# Patient Record
Sex: Female | Born: 1935
Health system: Southern US, Community
[De-identification: ages and names within clinical notes are randomized; demographics above are authoritative.]

## PROBLEM LIST (undated history)

## (undated) DIAGNOSIS — K635 Polyp of colon: Secondary | ICD-10-CM

## (undated) DIAGNOSIS — H9190 Unspecified hearing loss, unspecified ear: Secondary | ICD-10-CM

## (undated) DIAGNOSIS — E7800 Pure hypercholesterolemia, unspecified: Secondary | ICD-10-CM

## (undated) DIAGNOSIS — I639 Cerebral infarction, unspecified: Secondary | ICD-10-CM

## (undated) DIAGNOSIS — K5792 Diverticulitis of intestine, part unspecified, without perforation or abscess without bleeding: Secondary | ICD-10-CM

## (undated) DIAGNOSIS — T4145XA Adverse effect of unspecified anesthetic, initial encounter: Secondary | ICD-10-CM

## (undated) DIAGNOSIS — H3530 Unspecified macular degeneration: Secondary | ICD-10-CM

## (undated) DIAGNOSIS — I10 Essential (primary) hypertension: Secondary | ICD-10-CM

## (undated) DIAGNOSIS — Z8719 Personal history of other diseases of the digestive system: Secondary | ICD-10-CM

## (undated) DIAGNOSIS — Z8619 Personal history of other infectious and parasitic diseases: Secondary | ICD-10-CM

## (undated) DIAGNOSIS — F419 Anxiety disorder, unspecified: Secondary | ICD-10-CM

## (undated) DIAGNOSIS — M47816 Spondylosis without myelopathy or radiculopathy, lumbar region: Secondary | ICD-10-CM

## (undated) DIAGNOSIS — F0390 Unspecified dementia without behavioral disturbance: Secondary | ICD-10-CM

## (undated) HISTORY — PX: CATARACT EXTRACTION: SUR2

## (undated) HISTORY — DX: Unspecified hearing loss, unspecified ear: H91.90

## (undated) HISTORY — DX: Pure hypercholesterolemia, unspecified: E78.00

## (undated) HISTORY — DX: Diverticulitis of intestine, part unspecified, without perforation or abscess without bleeding: K57.92

## (undated) HISTORY — DX: Polyp of colon: K63.5

## (undated) HISTORY — PX: VENTRAL HERNIA REPAIR: SHX424

## (undated) HISTORY — PX: APPENDECTOMY: SHX54

## (undated) HISTORY — DX: Spondylosis without myelopathy or radiculopathy, lumbar region: M47.816

---

## 1985-09-30 HISTORY — PX: ABDOMINAL HYSTERECTOMY: SHX81

## 1987-10-01 HISTORY — PX: CHOLECYSTECTOMY: SHX55

## 1999-04-18 ENCOUNTER — Encounter: Payer: Self-pay | Admitting: Orthopedic Surgery

## 1999-04-18 ENCOUNTER — Ambulatory Visit (HOSPITAL_COMMUNITY): Admission: RE | Admit: 1999-04-18 | Discharge: 1999-04-18 | Payer: Self-pay | Admitting: Orthopedic Surgery

## 1999-04-25 ENCOUNTER — Ambulatory Visit: Admission: RE | Admit: 1999-04-25 | Discharge: 1999-04-25 | Payer: Self-pay | Admitting: Otolaryngology

## 2000-06-04 ENCOUNTER — Ambulatory Visit (HOSPITAL_COMMUNITY): Admission: RE | Admit: 2000-06-04 | Discharge: 2000-06-04 | Payer: Self-pay | Admitting: Gastroenterology

## 2000-06-04 ENCOUNTER — Encounter (INDEPENDENT_AMBULATORY_CARE_PROVIDER_SITE_OTHER): Payer: Self-pay | Admitting: *Deleted

## 2003-05-31 ENCOUNTER — Encounter: Payer: Self-pay | Admitting: Surgery

## 2003-06-02 ENCOUNTER — Inpatient Hospital Stay (HOSPITAL_COMMUNITY): Admission: RE | Admit: 2003-06-02 | Discharge: 2003-06-04 | Payer: Self-pay | Admitting: Surgery

## 2004-03-04 ENCOUNTER — Ambulatory Visit (HOSPITAL_COMMUNITY): Admission: RE | Admit: 2004-03-04 | Discharge: 2004-03-04 | Payer: Self-pay | Admitting: Family Medicine

## 2005-08-16 ENCOUNTER — Encounter: Admission: RE | Admit: 2005-08-16 | Discharge: 2005-08-16 | Payer: Self-pay | Admitting: Otolaryngology

## 2005-08-20 ENCOUNTER — Encounter (INDEPENDENT_AMBULATORY_CARE_PROVIDER_SITE_OTHER): Payer: Self-pay | Admitting: Specialist

## 2005-08-20 ENCOUNTER — Ambulatory Visit (HOSPITAL_BASED_OUTPATIENT_CLINIC_OR_DEPARTMENT_OTHER): Admission: RE | Admit: 2005-08-20 | Discharge: 2005-08-20 | Payer: Self-pay | Admitting: Otolaryngology

## 2005-08-20 ENCOUNTER — Ambulatory Visit (HOSPITAL_COMMUNITY): Admission: RE | Admit: 2005-08-20 | Discharge: 2005-08-20 | Payer: Self-pay | Admitting: Otolaryngology

## 2006-06-24 ENCOUNTER — Ambulatory Visit (HOSPITAL_COMMUNITY): Admission: RE | Admit: 2006-06-24 | Discharge: 2006-06-24 | Payer: Self-pay | Admitting: Family Medicine

## 2006-07-17 ENCOUNTER — Emergency Department (HOSPITAL_COMMUNITY): Admission: EM | Admit: 2006-07-17 | Discharge: 2006-07-17 | Payer: Self-pay | Admitting: Emergency Medicine

## 2006-07-23 ENCOUNTER — Ambulatory Visit: Payer: Self-pay | Admitting: Cardiology

## 2006-08-05 ENCOUNTER — Ambulatory Visit: Payer: Self-pay

## 2006-08-05 ENCOUNTER — Encounter: Payer: Self-pay | Admitting: Cardiology

## 2006-08-29 ENCOUNTER — Ambulatory Visit: Payer: Self-pay | Admitting: Gastroenterology

## 2006-09-08 ENCOUNTER — Ambulatory Visit: Payer: Self-pay | Admitting: Gastroenterology

## 2006-09-08 ENCOUNTER — Encounter (INDEPENDENT_AMBULATORY_CARE_PROVIDER_SITE_OTHER): Payer: Self-pay | Admitting: *Deleted

## 2006-10-08 ENCOUNTER — Ambulatory Visit: Payer: Self-pay | Admitting: Cardiology

## 2006-10-10 ENCOUNTER — Ambulatory Visit: Payer: Self-pay | Admitting: Gastroenterology

## 2008-02-25 ENCOUNTER — Ambulatory Visit (HOSPITAL_COMMUNITY): Admission: RE | Admit: 2008-02-25 | Discharge: 2008-02-25 | Payer: Self-pay | Admitting: Family Medicine

## 2008-03-08 ENCOUNTER — Encounter: Admission: RE | Admit: 2008-03-08 | Discharge: 2008-04-25 | Payer: Self-pay | Admitting: Family Medicine

## 2008-07-14 ENCOUNTER — Encounter: Admission: RE | Admit: 2008-07-14 | Discharge: 2008-07-14 | Payer: Self-pay | Admitting: Family Medicine

## 2010-10-21 ENCOUNTER — Encounter: Payer: Self-pay | Admitting: Family Medicine

## 2011-02-15 NOTE — Op Note (Signed)
NAME:  Kristy [name], Kristy [name]                ACCOUNT NO.:  [id number]   MEDICAL RECORD NO.:  [id number]          PATIENT TYPE:  AMB   LOCATION:  DSC                          FACILITY:  [facility]   PHYSICIAN:  Christopher E. Ezzard Standing, M.D.DATE OF BIRTH:  11-Jul-1928   DATE OF PROCEDURE:  08/20/2005  DATE OF DISCHARGE:                                 OPERATIVE REPORT   PREOPERATIVE DIAGNOSES:  1.  Left lower eyelid lesion (5 mm).  2.  Septal deformity to the right with nasal obstruction.  3.  Turbinate hypertrophy.  4.  Left middle turbinate concha bullosa.   POSTOPERATIVE DIAGNOSES:  1.  Left lower eyelid lesion (5 mm).  2.  Septal deformity to the right with nasal obstruction.  3.  Turbinate hypertrophy.  4.  Left middle turbinate concha bullosa.   OPERATION:  1.  Septoplasty with bilateral inferior turbinate reduction.  2.  Reduction of left middle turbinate concha bullosa.  3.  Excision of left lower eyelid lesion (5 mm).   SURGEON:  Kristine Garbe. Ezzard Standing, M.D.   ANESTHESIA:  General endotracheal.   COMPLICATIONS:  None.   BRIEF CLINICAL NOTE:  Kristy [name] is a 77 year old female who has chronic  nasal obstruction with a moderate septal deformity to the right.  She has a  very large turbinate bilaterally.  She also has a small lesion on the left  lower eyelid, which appears benign.  In addition to the nasal obstruction,  she has a chronic snoring problem.  She has apparently had previous sleep  tests, which did not demonstrate significant sleep apnea, but because of her  nasal obstruction and tendency to breathe through her mouth along with the  snoring, we will plan septoplasty and turbinate reduction to help to improve  the nasal airway to see if this helps improve her breathing as well as her  snoring.  She also has a small lesion on the left orbital eyelid.  She would  like it removed, and we will remove that at the same time.   DESCRIPTION OF PROCEDURE:  After adequate  endotracheal anesthesia, the  lesion of the left lower eyelid was injected with 0.5 mL of Xylocaine with  epinephrine.  The lesion was then elliptically excised and sent to  pathology.  The small defect was closed with a single 6-0 nylon suture.  Next, the nose was prepped with Betadine solution, draped out with sterile  towels and the septum and turbinates were injected with Xylocaine with  epinephrine.  The patient had a deformity of the septum to the right with a  spur along the maxillary crest on the right side, extended posteriorly.  She  also had large turbinates and what appears to be a left middle turbinate  concha bullosa.  A hemitransfixion incision was made along the cartilage of  the septum on the right side and mucoperichondrial and mucoperiosteal flaps  were elevated posteriorly.  The cartilaginous septum was bowed off the crest  into the right airway, and this portion of the cartilaginous septum was  excised off the maxillary crest on the right side.  At the junction of the  cartilaginous and bony septum, a curved incision was made and  mucoperichondrial and mucoperiosteal flaps were elevated posteriorly on  either side of the bony septum, and the bony septum that protruded to the  right side was removed.  This completed the septoplasty portion of the  procedure.  Next the inferior turbinate reductions were performed.  Incision  was made along the inferior edge of the inferior turbinates bilaterally.  Mucoperichondrial and mucoperiosteal flaps were elevated off of the  turbinate bone and the turbinate bone was removed with Lenoria Chime forceps.  Submucosal cauterization of the inferior turbinates was performed  bilaterally.  The remaining turbinate was outfractured.  Of note, on the  left side the patient had a large left middle turbinate consistent with  concha bullosa.  A 15 scalpel was used to make a vertical incision in the  left middle turbinate and open up the concha  bullosa.  The medial portion of  the concha bullosa was then excised with through-cup forceps.  This  completed the reduction of the left middle turbinate concha bullosa.  The  hemitransfixion incision was closed with interrupted 4-0 chromic sutures,  the septum was basted with a 4-0 chromic suture, and then the nose was  packed with Telfa soaked in bacitracin ointment bilaterally.  This completed  the procedure.  Aften was awoken from anesthesia and transferred to the  recovery room postop doing well.   DISPOSITION:  Angeni was discharged home later this morning on Keflex 500 mg  b.i.d. for five days, Tylenol and Tylenol No. 3 p.r.n. pain.  We will have  her follow up in my office tomorrow morning to have her nasal packs removed.           ______________________________  Kristine Garbe Ezzard Standing, M.D.     CEN/MEDQ  D:  08/20/2005  T:  08/20/2005  Job:  [id number]   cc:   Ernestina Penna, M.D.  Fax: [phone]

## 2011-02-15 NOTE — Assessment & Plan Note (Signed)
HEALTHCARE                            CARDIOLOGY OFFICE NOTE   NAME:Kristy [name], Kristy [name]                       MRN:          [id number]  DATE:10/08/2006                            DOB:          1927/11/23    PRIMARY:  Dr. Christell Constant.   REASON FOR PRESENTATION:  Evaluate the patient with palpitations.  She  saw Dr. Jens Som in October.  He started her on Toprol 25 mg daily.  He  also ordered a stress perfusion study that demonstrated no evidence of  ischemia or infarct.  She had an echocardiogram, which demonstrated a  well-preserved ejection fraction.  She did have a monitor by Dr. Christell Constant  that demonstrated a sustained supraventricular tachycardia, requiring  treatment with adenosine.  However, the patient has had no further  palpitations, chest discomfort, pre-syncope, or syncope.   PAST MEDICAL HISTORY:  Supraventricular tachycardia, diabetes mellitus  x14 to 15 years, hyperlipidemia, arthritis, cholecystectomy,  hysterectomy.   ALLERGIES:  None.   MEDICATIONS:  1. TriCor 160 mg daily.  2. Aspirin 81 mg daily.  3. Black cohosh.  4. Lisinopril 2.5 mg daily.  5. Glucovance 5/500 daily.  6. Actos 30 mg daily.  7. Metoprolol 25 mg daily.  8. Estradiol 1 mg daily.  9. Caltrate.   REVIEW OF SYSTEMS:  Positive for sciatica.  Also positive for increased  lower extremity swelling recently.   PHYSICAL EXAMINATION:  The patient is in no distress.  Blood pressure 112/78, heart rate 66 and regular, weight 193 pounds.  Body mass index 34.  HEENT:  Eyelids unremarkable.  Pupils are equal, round, and reactive to  light and accommodation.  Fundi not visualized.  Oral mucosa  unremarkable.  NECK:  No jugular venous distension at 45 degrees, carotid upstroke  brisk and symmetric, no bruits, thyromegaly.  LYMPHATICS:  No cervical, axillary, or inguinal adenopathy.  LUNGS:  Clear to auscultation bilaterally.  BACK:  No costovertebral angle tenderness.  CHEST:   Unremarkable.  HEART:  PMI not displaced or sustained, S1 and S2 within normal limits,  no S3, no S4, no murmurs.  ABDOMEN:  Flat, positive bowel sounds, normal in frequency and pitch, no  bruits, rebound, guarding.  No midline pulsatile masses.  No  hepatomegaly, splenomegaly.  SKIN:  No rashes, no nodules.  EXTREMITIES:  With 2+ pulses throughout, no edema, cyanosis, clubbing.  NEURO:  Oriented to person, place, and time, cranial nerves 2-12 grossly  intact, motor grossly intact.   EKG:  Sinus rhythm, rate 66, axis within normal limits, intervals within  normal limits, no acute ST-T wave changes.   ASSESSMENT AND PLAN:  1. Supraventricular tachycardia.  The patient has had no symptomatic      recurrence of this.  She knows to take her beta blocker and she      could even have an increased dose.  She should practice vagal      maneuvers if she does have this arrhythmia and let us know, we will      call 9-1-1.  We could talk about an ablation if she has a  recurrence for continued medical management.  2. Swelling.  The patient had swelling, which was concerning to her.      However, shortly before the swelling began, she had been given some      samples of 30 mg of Actos, which is double her previous dose.  She      took the samples and has been on this ever since, and this may      correlate with the swelling.  I suggested that she reduce the Actos      back down to 15 mg a day and follow up with Dr. Christell Constant.  If she has      no resolution of her swelling, she could be given a brief course of      diuretics and continued management if the edema does not resolve      with a lower dose of Actos and the brief course of diuretic.  At      this point, I do not suspect a cardiac etiology.  3. Followup.  I can see her back as needed, and in particular if she      has any recurrent palpitations.     Rollene Rotunda, MD, [facility]  Electronically Signed    JH/MedQ  DD: 10/08/2006  DT:  10/08/2006  Job #: [id number]   cc:   Ernestina Penna, M.D.

## 2011-02-15 NOTE — Procedures (Signed)
[place]  Patient:    Kristy [name], Kristy [name]                       MRN: [id number] Proc. Date: 06/04/00 Adm. Date:  [id number] Attending:  Mardella Layman CC:         Rudi Heap, M.D., [facility]                           Procedure Report  HISTORY:  Kristy [name] is a 77 year old white female and has a family history of colon polyps in her sister.  She was seen in my office on May 27, 2000 and otherwise denied any GI complaints, except for some occasional heart burn problems.  It was felt that colonoscopy and possible polypectomy were in order.  The risks and benefits of this procedure, including bleeding, perforation were explained in detail, she agreed to proceed as planned. Preoperative cardiopulmonary, mental status examinations were unremarkable.  PROCEDURE:  Colonoscopy.  Throughout this procedure the patient was on pulse oximetry and cardiac monitorization.  She tolerated this procedure well and received supplemental low-flow oxygen by nasal cannula.  Throughout the procedure, she was anesthetized with 50 mcg of IV fentanyl and 2.5 mg of IV Versed.  Inspection of her rectum was unremarkable as was rectal examination. Her rectum was intubated using Olympus pediatric video colonoscope.   This advanced through a very redundant and tortuous sigmoid colon with severe diverticulosis and thickened, red haustral folds, into the cecum.  The base of cecum, including the ileocecal valve was unremarkable.  The colonoscope was then withdrawn up the ascending colon through the transverse colon.  In the descending colon, there was a 5 mm diminutive polyp that was removed with electrocautery snare at an 18 watt coagulation setting. There was a more prominent 1 to 1.5 cm polyp in the sigmoid colon and also was removed with electrocautery snare technique.  This was retrieved and sent to pathology for examination.  Reinspection of the polypectomy sites showed  bleeding or deep burn of the colon wall.  Again, there was severe diverticular disease of the sigmoid colon, I could see now the gross because of polypoid lesion on examination the rectosigmoid area.  She was extubated without difficulty, and returned in stable condition to the recovery room for observation.  ASSESSMENT: 1. Two left colon polyps excised electrocautery technique. 2. Moderately severe sigmoid colon diverticulosis. 3. Family history of colon polyps in her sister.  RECOMMENDATIONS: 1. Standard postpolypectomy orders and precautions. 2. Follow up on pathology results on polyps. 3. Follow-up examination in three years time. DD:  06/04/00 TD:  06/04/00 Job: [id number]

## 2011-02-15 NOTE — Assessment & Plan Note (Signed)
[facility]                         GASTROENTEROLOGY OFFICE NOTE   NAME:[name], [name]                       MRN:          [id number]  DATE:10/10/2006                            DOB:          01-05-1928    Kristy [name] really is fairly asymptomatic.  She underwent endoscopy and  colonoscopy on September 08, 2006, because of guaiac-positive stools.  The colonoscopy showed diverticulosis, but no colon polyps.  Her  endoscopy showed a Schatzki's ring with erosive gastritis.  Biopsies for  Helicobacter pylori were positive and she had a rather marked  inflammatory gastritis.   Because of her guaiac-positive stools and gastritis and her need to take  daily aspirin, I have decided to treat her with a Prevpac for ten days.  This should give her protection against future aspirin and NSAID use.  At the end of that time, I will ask her to check with Dr. Christell Constant for  followup Hemoccult cards as an outpatient.  She is to continue her other  medications, listed and reviewed in her chart, per Dr. Christell Constant.     Vania Rea. Jarold Motto, MD, [facility], FAGA  Electronically Signed    DRP/MedQ  DD: 10/10/2006  DT: 10/10/2006  Job #: [id number]

## 2011-10-01 HISTORY — PX: OTHER SURGICAL HISTORY: SHX169

## 2011-11-27 ENCOUNTER — Telehealth: Payer: Self-pay | Admitting: Internal Medicine

## 2011-11-27 NOTE — Telephone Encounter (Signed)
called pt and she declines appt for now,req to have her bld chkd one more time by her doctor,advised her to be sure and advise him of this and she is aware.  Aware we will need a new ref.  letter faxed to phy aom

## 2011-12-05 ENCOUNTER — Telehealth: Payer: Self-pay | Admitting: Internal Medicine

## 2011-12-05 NOTE — Telephone Encounter (Signed)
pt aware of 3/13 new pt appt  aom

## 2011-12-05 NOTE — Telephone Encounter (Signed)
pt aware of appt change from 3/13 to 3/18   aom

## 2011-12-09 ENCOUNTER — Telehealth: Payer: Self-pay | Admitting: Internal Medicine

## 2011-12-09 NOTE — Telephone Encounter (Signed)
Referred by Dr. Rudi Heap Dx- Leukocytosis

## 2011-12-11 ENCOUNTER — Other Ambulatory Visit: Payer: Self-pay | Admitting: Lab

## 2011-12-11 ENCOUNTER — Ambulatory Visit: Payer: Self-pay

## 2011-12-11 ENCOUNTER — Ambulatory Visit: Payer: Self-pay | Admitting: Internal Medicine

## 2011-12-15 ENCOUNTER — Other Ambulatory Visit: Payer: Self-pay | Admitting: Internal Medicine

## 2011-12-15 DIAGNOSIS — D72829 Elevated white blood cell count, unspecified: Secondary | ICD-10-CM

## 2011-12-16 ENCOUNTER — Other Ambulatory Visit (HOSPITAL_COMMUNITY)
Admission: RE | Admit: 2011-12-16 | Discharge: 2011-12-16 | Disposition: A | Discharge status: Home / Self Care | Payer: Medicare Other | Source: Ambulatory Visit | Attending: Internal Medicine | Admitting: Internal Medicine

## 2011-12-16 ENCOUNTER — Encounter: Payer: Self-pay | Admitting: Internal Medicine

## 2011-12-16 ENCOUNTER — Ambulatory Visit: Payer: Self-pay

## 2011-12-16 ENCOUNTER — Telehealth: Payer: Self-pay | Admitting: *Deleted

## 2011-12-16 ENCOUNTER — Other Ambulatory Visit (HOSPITAL_BASED_OUTPATIENT_CLINIC_OR_DEPARTMENT_OTHER): Payer: Medicare Other

## 2011-12-16 ENCOUNTER — Ambulatory Visit (HOSPITAL_BASED_OUTPATIENT_CLINIC_OR_DEPARTMENT_OTHER): Payer: Medicare Other | Admitting: Internal Medicine

## 2011-12-16 VITALS — BP 138/63 | HR 88 | Temp 97.6°F | Ht 63.0 in | Wt 173.1 lb

## 2011-12-16 DIAGNOSIS — C9110 Chronic lymphocytic leukemia of B-cell type not having achieved remission: Secondary | ICD-10-CM

## 2011-12-16 DIAGNOSIS — D72829 Elevated white blood cell count, unspecified: Secondary | ICD-10-CM | POA: Insufficient documentation

## 2011-12-16 LAB — CBC WITH DIFFERENTIAL/PLATELET
BASO%: 0.3 % (ref 0.0–2.0)
Basophils Absolute: 0 10*3/uL (ref 0.0–0.1)
Eosinophils Absolute: 0.1 10*3/uL (ref 0.0–0.5)
HCT: 37.9 % (ref 34.8–46.6)
HGB: 13 g/dL (ref 11.6–15.9)
MCHC: 34.2 g/dL (ref 31.5–36.0)
MONO#: 0.8 10*3/uL (ref 0.1–0.9)
NEUT#: 5.4 10*3/uL (ref 1.5–6.5)
NEUT%: 45.4 % (ref 38.4–76.8)
WBC: 12 10*3/uL — ABNORMAL HIGH (ref 3.9–10.3)
lymph#: 5.6 10*3/uL — ABNORMAL HIGH (ref 0.9–3.3)

## 2011-12-16 LAB — COMPREHENSIVE METABOLIC PANEL
ALT: 12 U/L (ref 0–35)
CO2: 20 mEq/L (ref 19–32)
Calcium: 9.7 mg/dL (ref 8.4–10.5)
Chloride: 105 mEq/L (ref 96–112)
Creatinine, Ser: 1.04 mg/dL (ref 0.50–1.10)
Glucose, Bld: 270 mg/dL — ABNORMAL HIGH (ref 70–99)

## 2011-12-16 LAB — LACTATE DEHYDROGENASE: LDH: 125 U/L (ref 94–250)

## 2011-12-16 NOTE — Progress Notes (Signed)
[facility] Telephone:([phone]   [phone]  CONSULT NOTE  REASON FOR CONSULTATION:  78 years old white female with leukocytosis.  HPI Kristy [name] is a 78 y.o. female was past medical history significant for diabetes mellitus, dyslipidemia, diverticulitis and history of back pain. The patient was seen recently by her primary care physician Dr. Christell Constant. She was noted to have elevated the total white blood count. On 10/31/2011, total white blood count was 11.8 with absolute lymphocyte count of 6200. Repeat CBC on 11/19/2011 showed total white blood count was12.1 with absolute lymphocyte count of 6000. The patient has normal hemoglobin and hematocrit as well as normal platelets count. Dr. Christell Constant kindly referred the patient to me today for evaluation and recommendation regarding her condition. She denied having any significant weight loss or night sweats. Has no chest pain or shortness of breath. Has no palpable lymphadenopathy or bleeding issues but she has lack of energy. She is not on any steroids except back injection every 3 months. The patient has no family history of leukemia or lymphoma. Her mother died at age 39, father had congestive heart failure and stoke and a sister with gallbladder cancer. She is married and has 2 children, one recently died from pulmonary embolism. The patient has no history of smoking, alcohol or drug abuse.  @SFHPI @  Past Medical History  Diagnosis Date  . Diabetes mellitus   . Hypercholesteremia   . Colon polyps   . Lumbar spondylosis   . Diverticulitis     Past Surgical History  Procedure Date  . Cholecystectomy   . Abdominal hysterectomy     History reviewed. No pertinent family history.  Social History History  Substance Use Topics  . Smoking status: Never Smoker   . Smokeless tobacco: Not on file  . Alcohol Use: No    Allergies  Allergen Reactions  . Lipitor (Atorvastatin Calcium)   . Vioxx (Rofecoxib)      Current Outpatient Prescriptions  Medication Sig Dispense Refill  . ALPRAZolam (XANAX) 0.25 MG tablet Take 0.25 mg by mouth as needed.      Marland Kitchen aspirin 81 MG tablet Take 81 mg by mouth daily.      . Esomeprazole Magnesium (NEXIUM PO) Take by mouth.      . estradiol (ESTRACE) 1 MG tablet Take 1 mg by mouth daily.      Marland Kitchen ezetimibe (ZETIA) 10 MG tablet Take 10 mg by mouth daily.      . fish oil-omega-3 fatty acids 1000 MG capsule Take by mouth daily.      . furosemide (LASIX) 40 MG tablet Take 40 mg by mouth daily.      Marland Kitchen GABAPENTIN PO Take by mouth. Pt unsure of dose      . ibuprofen (ADVIL,MOTRIN) 200 MG tablet Take 200 mg by mouth every 6 (six) hours as needed.      Marland Kitchen POTASSIUM CHLORIDE PO Take by mouth. OTC PRN      . pravastatin (PRAVACHOL) 80 MG tablet Take 80 mg by mouth daily.      Marland Kitchen UNABLE TO FIND Black Cohash      . VITAMIN D, CHOLECALCIFEROL, PO Take by mouth.        Review of Systems  A comprehensive review of systems was negative except for: Constitutional: positive for fatigue  Physical Exam  AVW:UJWJX, healthy, no distress, well nourished and well developed SKIN: skin color, texture, turgor are normal HEAD: Normocephalic, No masses, lesions, tenderness or  abnormalities EYES: normal OROPHARYNX:no exudate, no erythema and lips, buccal mucosa, and tongue normal  NECK: supple, no adenopathy LYMPH:  no palpable lymphadenopathy, no hepatosplenomegaly BREAST:not examined LUNGS: clear to auscultation  HEART: regular rate & rhythm, no murmurs and no gallops ABDOMEN:abdomen soft, non-tender, normal bowel sounds and no masses or organomegaly BACK: Back symmetric, no curvature. EXTREMITIES:no joint deformities, effusion, or inflammation, no edema, no skin discoloration, no clubbing, no cyanosis  NEURO: alert & oriented x 3 with fluent speech, no focal motor/sensory deficits  PERFORMANCE STATUS: ECOG   Studies/Results: No results found.   ASSESSMENT: This is a very  pleasant 78 years old white female with persistent leukocytosis and lymphocytosis is suspicious for stage 0 chronic lymphocytic leukemia.   PLAN: I have a lengthy discussion with the patient and her husband today about her condition and further investigation to confirm the diagnosis. I ordered repeat CBC, comprehensive metabolic panel, LDH as well as flow cytometry of the peripheral blood.  I recommended for the patient observation for now with repeat CBC and LDH in 6 months.  She was advised to call me immediately if she has any concerning symptoms in the interval.  All questions were answered. The patient knows to call the clinic with any problems, questions or concerns. We can certainly see the patient much sooner if necessary.  Thank you so much for allowing me to participate in the care of Kristy [name]. I will continue to follow up the patient with you and assist in her care.  I spent 25 minutes counseling the patient face to face. The total time spent in the appointment was 50 minutes.   Torunn Chancellor K. 12/16/2011, 10:53 AM

## 2011-12-16 NOTE — Telephone Encounter (Signed)
gave patient appointment for 06-17-2012 starting at 9:00am printed out calendar and gave to the patient

## 2012-01-31 ENCOUNTER — Ambulatory Visit (HOSPITAL_COMMUNITY)
Admission: RE | Admit: 2012-01-31 | Discharge: 2012-01-31 | Disposition: A | Discharge status: Home / Self Care | Payer: Medicare Other | Source: Ambulatory Visit | Attending: Family Medicine | Admitting: Family Medicine

## 2012-01-31 ENCOUNTER — Other Ambulatory Visit: Payer: Self-pay | Admitting: Family Medicine

## 2012-01-31 DIAGNOSIS — R319 Hematuria, unspecified: Secondary | ICD-10-CM | POA: Insufficient documentation

## 2012-01-31 DIAGNOSIS — R1031 Right lower quadrant pain: Secondary | ICD-10-CM | POA: Insufficient documentation

## 2012-01-31 DIAGNOSIS — N200 Calculus of kidney: Secondary | ICD-10-CM | POA: Insufficient documentation

## 2012-01-31 DIAGNOSIS — R109 Unspecified abdominal pain: Secondary | ICD-10-CM

## 2012-02-03 ENCOUNTER — Other Ambulatory Visit: Payer: Self-pay | Admitting: Neurosurgery

## 2012-02-03 DIAGNOSIS — M545 Low back pain: Secondary | ICD-10-CM

## 2012-02-03 DIAGNOSIS — M25559 Pain in unspecified hip: Secondary | ICD-10-CM

## 2012-02-04 ENCOUNTER — Ambulatory Visit
Admission: RE | Admit: 2012-02-04 | Discharge: 2012-02-04 | Disposition: A | Discharge status: Home / Self Care | Payer: Medicare Other | Source: Ambulatory Visit | Attending: Neurosurgery | Admitting: Neurosurgery

## 2012-02-04 DIAGNOSIS — M545 Low back pain: Secondary | ICD-10-CM

## 2012-02-04 DIAGNOSIS — M25559 Pain in unspecified hip: Secondary | ICD-10-CM

## 2012-02-08 ENCOUNTER — Other Ambulatory Visit: Payer: Medicare Other

## 2012-02-14 ENCOUNTER — Other Ambulatory Visit: Payer: Self-pay | Admitting: Neurosurgery

## 2012-03-02 ENCOUNTER — Encounter (HOSPITAL_COMMUNITY): Payer: Self-pay | Admitting: Respiratory Therapy

## 2012-03-03 ENCOUNTER — Inpatient Hospital Stay (HOSPITAL_COMMUNITY): Admission: RE | Admit: 2012-03-03 | Discharge: 2012-03-03 | Payer: Medicare Other | Source: Ambulatory Visit

## 2012-03-03 ENCOUNTER — Encounter (HOSPITAL_COMMUNITY): Payer: Self-pay

## 2012-03-03 HISTORY — DX: Essential (primary) hypertension: I10

## 2012-03-03 HISTORY — DX: Personal history of other diseases of the digestive system: Z87.19

## 2012-03-03 HISTORY — DX: Anxiety disorder, unspecified: F41.9

## 2012-03-10 ENCOUNTER — Encounter (HOSPITAL_COMMUNITY)
Admission: RE | Admit: 2012-03-10 | Discharge: 2012-03-10 | Disposition: A | Discharge status: Home / Self Care | Payer: Medicare Other | Source: Ambulatory Visit | Attending: Neurosurgery | Admitting: Neurosurgery

## 2012-03-10 LAB — BASIC METABOLIC PANEL
BUN: 22 mg/dL (ref 6–23)
CO2: 27 mEq/L (ref 19–32)
Chloride: 102 mEq/L (ref 96–112)
Creatinine, Ser: 0.98 mg/dL (ref 0.50–1.10)
Glucose, Bld: 265 mg/dL — ABNORMAL HIGH (ref 70–99)
Potassium: 3.9 mEq/L (ref 3.5–5.1)

## 2012-03-10 LAB — CBC
HCT: 39.3 % (ref 36.0–46.0)
Hemoglobin: 13.4 g/dL (ref 12.0–15.0)
MCHC: 34.1 g/dL (ref 30.0–36.0)
MCV: 89.7 fL (ref 78.0–100.0)
RDW: 13.5 % (ref 11.5–15.5)

## 2012-03-10 LAB — TYPE AND SCREEN

## 2012-03-10 LAB — ABO/RH: ABO/RH(D): A POS

## 2012-03-10 NOTE — Pre-Procedure Instructions (Addendum)
20 Kristy [name]  03/10/2012   Your procedure is scheduled on:  Tuesday June 18  Report to [facility] at 8:15 AM.  Call this number if you have problems the morning of surgery: [phone]   Remember:   Do not eat or drink:After Midnight.    Take these medicines the morning of surgery with A SIP OF WATER: Metoprolol. Xanax and Nexium if needed.   Discontinue aspirin, Coumadin, Plavix, Effient, and herbal medications   Do not wear jewelry, make-up or nail polish.  Do not wear lotions, powders, or perfumes. You may wear deodorant.  Do not shave 48 hours prior to surgery. Men may shave face and neck.  Do not bring valuables to the hospital.  Contacts, dentures or bridgework may not be worn into surgery.  Leave suitcase in the car. After surgery it may be brought to your room.  For patients admitted to the hospital, checkout time is 11:00 AM the day of discharge.   Patients discharged the day of surgery will not be allowed to drive home.  Name and phone number of your driver: NA  Special Instructions: CHG Shower Use Special Wash: 1/2 bottle night before surgery and 1/2 bottle morning of surgery.   Please read over the following fact sheets that you were given: Pain Booklet, Coughing and Deep Breathing, Blood Transfusion Information and Surgical Site Infection Prevention

## 2012-03-16 MED ORDER — CEFAZOLIN SODIUM 1-5 GM-% IV SOLN: 1.0000 g | INTRAVENOUS | Status: DC

## 2012-03-16 NOTE — H&P (Signed)
Kristy [name]  #[id number]  DOB:  11/18/1927  02/12/2012:  Kristy [name] comes in today.  She says she is considerable worse than she was previously and is really not in a position to be able to tolerate persistent level of pain.  She says injections are no longer giving her a great deal of relief and certainly no sustained relief.    A repeat MRI was obtained of her lumbar spine which shows progression of degenerative disease at L4-5 where there is severe central canal and lateral recess narrowing worse on the left with advanced facet degenerative disease resulting in 4 mm. of anterolisthesis at this level which is new from previous study.  At L3-4 there is some disc degeneration and moderate right foraminal narrowing which is unchanged.    Based on Kristy [name]'s severe pain and progressively worsening, I have recommended that we proceed with decompression and fusion at the L4-5 level.  She wishes to do so and this will be setup in the relatively near future.  Risks and benefits were discussed with the patient and she wishes to proceed.    I went over the surgical procedure with the patient in great detail today.  I went over the diagnostic studies, as well as surgical models.  We discussed the potential risks and benefits of the surgery, as well as expected hospital course.  The patient would generally wear a brace for 3  months after surgery.  The risks include, but are not limited to, bleeding, the need for transfusion, infection, damage to nerves and vessels, risks of anesthesia, dural tear, injury to lumbar nerve roots causing either temporary or permanent leg pain, numbness or weakness, malpositioning of instrumentation, fusion failure, failure to relieve pain, back pain after surgery, recurrent disc herniation, worsening of pain and adjacent degeneration after a spinal fusion.  Also, the potential for the need for further surgery in the event of incomplete fusion.  We also discussed the risks of injury to  abdominal structures including bowel, iliac artery or vein injury.          Kristy [name]. Kristy [name], M.D./sv NE[facility]OS[facility]ATION   Kristy [name]   DOB:  [birth year]  #[id number]    August 04, 2008   HISTORY:     Kristy [name] is a 78 year old woman, an established patient of mine, who comes in with a chief complaint of bilateral lower extremity pain. She currently describes constant leg pain, intermittent sharp pain into her legs, denies any back pain and says that she only feels "tired" in her legs.  She says that both legs feel as if they will collapse.  She has been taking Ibuprofen 200 mg. three times daily and occasionally takes two at night. She says this has been going on for the last six months and is progressively worsening. This began approximately a year ago.  She has engaged in physical therapy, chiropractic treatment and exercises and says these have not been helpful to her.    She says the pain is "miserable and I can't do nothing".  She notes tiredness in her legs. She says she leans forward when she walks.  The maximum she is able to walk is about 50 feet and she says she is better with sitting and best with lying flat. The pain does not go away fully with sitting.    Kristy [name] had an MRI of the lumbar spine which was performed on the 15th of October 2009, which demonstrates a posterior arachnoid cyst from T12  to L1 on the right, but does not have any aggressive characteristics and appears to be a benign cyst. She has multilevel spondylosis causing moderate central stenosis at L4-5 with broad-based foraminal and extraforaminal disc protrusion on the left, and at L5-S1 she has right greater than left foraminal stenosis and at L3-4 she has some right foraminal stenosis and spondylosis.   I obtained plain radiographs which show significant multilevel degenerative disc disease and calcified disc bulges without soft disc herniations.    REVIEW OF SYSTEMS:   A detailed Review of Systems sheet was  reviewed with the patient and essentially unremarkable.  All other systems are negative; this includes Constitutional symptoms, Eyes, Cardiovascular, Ears, nose, mouth, throat, Endocrine, Respiratory, Gastrointestinal, Genitourinary, Musculoskeletal, Integumentary & Breast, Neurologic, Psychiatric, Hematologic/Lymphatic, Allergic/Immunologic.    PAST MEDICAL HISTORY:      Prior Operations and Hospitalizations:   She has a past surgical history of a cholecystectomy in 1989, hysterectomy in 1987, and hernia repair x 2 with a large amount of implanted mesh.    Medications and Allergies:  Current medications - Glucovance 5/500 bid, Estradiol 1 mg. bid, TriCor 134 mg. qhs, Metoprolol 25 mg. qd, Pravastatin 40 mg. bid, Lisinopril 5 mg.  qd, Ibuprofen 3 or 4 qd, Calcium chews 500 mg. bid, Fish Oil 1000 mg. qd, Black Cohosh 2 qd, Vitamin D 1000 mg. qd, and Bayer Aspirin 81 mg. qd.  No known medication allergies.      Height and Weight:     She is 5'3" tall, 178 lbs.    FAMILY HISTORY:    Not specified.  SOCIAL HISTORY:    She is a nonsmoker, nondrinker, and no history of substance abuse.    PHYSICAL EXAMINATION:      General Appearance:   On examination today, Kristy [name] is a pleasant and cooperative woman in no acute distress.      Blood Pressure, Pulse:     Her blood pressure is 130/70.  Heart rate is 70 and irregular.      HEENT - normocephalic, atraumatic.  The pupils are equal, round and reactive to light.  The extraocular muscles are intact.  Sclerae - white.  Conjunctiva - pink.  Oropharynx benign.  Uvula midline.     Neck - there are no masses, meningismus, deformities, tracheal deviation, jugular vein distention or carotid bruits.  There is normal cervical range of motion.  Spurlings' test is negative without reproducible radicular pain turning the patient's head to either side.  Lhermitte's sign is not present with axial compression.      Respiratory - there is normal respiratory effort  with good intercostal function.  Lungs are clear to auscultation.  There are no rales, rhonchi or wheezes.      Cardiovascular - the heart has regular rate and rhythm to auscultation.  No murmurs are appreciated.  There is no extremity edema, cyanosis or clubbing.  There are palpable pedal pulses.      Abdomen - soft, nontender, no hepatosplenomegaly appreciated or masses.  There are active bowel sounds.  No guarding or rebound.    Musculoskeletal Examination - She has no midline low back pain to palpation or significant sciatic notch discomfort. She is able to bend to within four inches of the floor with her upper extremities outstretched.  She is able to stand on her heels and toes.  She has a negative straight leg raise.    NEUROLOGICAL EXAMINATION: The patient is oriented to time, person and place and has good  recall of both recent and remote memory with normal attention span and concentration.  The patient speaks with clear and fluent speech and exhibits normal language function and appropriate fund of knowledge.      Cranial Nerve Examination - pupils are equal, round and reactive to light.  Extraocular movements are full.  Visual fields are full to confrontational testing.  Facial sensation and facial movement are symmetric and intact.  Hearing is intact to finger rub.  Palate is upgoing.  Shoulder shrug is symmetric.  Tongue protrudes in the midline.      Motor Examination - motor strength is 5/5 in the bilateral deltoids, biceps, triceps, handgrips, wrist extensors, interosseous.  In the lower extremities motor strength is 5/5 in hip flexion, extension, quadriceps, hamstrings, plantar flexion, dorsiflexion and extensor hallucis longus.      Sensory Examination - normal to light touch and pinprick sensation in the upper and lower extremities.     Deep Tendon Reflexes - 2 in the biceps, triceps, and brachioradialis, 2 in the knees, 1 in the ankles.  The great toes are downgoing to plantar  stimulation.     Cerebellar Examination - normal coordination in upper and lower extremities and normal rapid alternating movements.  Romberg test is negative.    IMPRESSION AND RECOMMENDATIONS: Kristy [name] is a 78 year old woman with low back and bilateral lower extremity pain. She has lumbar spondylosis and stenosis and an incidental arachnoid cyst. I have recommended that she undergo a course of epidural steroid injections to see if these will give her any relief.  If they do not help her we may talk about further interventions which might consist of decompressive laminectomy for spinal stenosis.  We will make further recommendations depending on how she does with the epidural steroid injections.    [facility]   Kristy [name]. Kristy [name], M.D.

## 2012-03-17 ENCOUNTER — Encounter (HOSPITAL_COMMUNITY): Payer: Self-pay | Admitting: Critical Care Medicine

## 2012-03-17 ENCOUNTER — Encounter (HOSPITAL_COMMUNITY)
Admission: RE | Disposition: A | Discharge status: Home Health | Payer: Self-pay | Source: Ambulatory Visit | Attending: Neurosurgery

## 2012-03-17 ENCOUNTER — Encounter (HOSPITAL_COMMUNITY): Payer: Self-pay | Admitting: *Deleted

## 2012-03-17 ENCOUNTER — Inpatient Hospital Stay (HOSPITAL_COMMUNITY)
Admission: RE | Admit: 2012-03-17 | Discharge: 2012-03-20 | DRG: 460 | Disposition: A | Discharge status: Home Health | Payer: Medicare Other | Source: Ambulatory Visit | Attending: Neurosurgery | Admitting: Neurosurgery

## 2012-03-17 ENCOUNTER — Inpatient Hospital Stay (HOSPITAL_COMMUNITY): Payer: Medicare Other

## 2012-03-17 ENCOUNTER — Inpatient Hospital Stay (HOSPITAL_COMMUNITY): Payer: Medicare Other | Admitting: Critical Care Medicine

## 2012-03-17 DIAGNOSIS — M5137 Other intervertebral disc degeneration, lumbosacral region: Principal | ICD-10-CM | POA: Diagnosis present

## 2012-03-17 DIAGNOSIS — C9110 Chronic lymphocytic leukemia of B-cell type not having achieved remission: Secondary | ICD-10-CM

## 2012-03-17 DIAGNOSIS — K449 Diaphragmatic hernia without obstruction or gangrene: Secondary | ICD-10-CM | POA: Diagnosis present

## 2012-03-17 DIAGNOSIS — Z01812 Encounter for preprocedural laboratory examination: Secondary | ICD-10-CM

## 2012-03-17 DIAGNOSIS — Z01818 Encounter for other preprocedural examination: Secondary | ICD-10-CM

## 2012-03-17 DIAGNOSIS — I10 Essential (primary) hypertension: Secondary | ICD-10-CM | POA: Diagnosis present

## 2012-03-17 DIAGNOSIS — E119 Type 2 diabetes mellitus without complications: Secondary | ICD-10-CM | POA: Diagnosis present

## 2012-03-17 DIAGNOSIS — M51379 Other intervertebral disc degeneration, lumbosacral region without mention of lumbar back pain or lower extremity pain: Principal | ICD-10-CM | POA: Diagnosis present

## 2012-03-17 DIAGNOSIS — Q762 Congenital spondylolisthesis: Secondary | ICD-10-CM

## 2012-03-17 DIAGNOSIS — M47817 Spondylosis without myelopathy or radiculopathy, lumbosacral region: Secondary | ICD-10-CM | POA: Diagnosis present

## 2012-03-17 LAB — GLUCOSE, CAPILLARY

## 2012-03-17 SURGERY — POSTERIOR LUMBAR FUSION 1 LEVEL
Anesthesia: General | Site: Back

## 2012-03-17 MED ORDER — GLYCOPYRROLATE 0.2 MG/ML IJ SOLN
INTRAMUSCULAR | Status: DC | PRN
  Administered 2012-03-17: .6 mg via INTRAVENOUS

## 2012-03-17 MED ORDER — ONDANSETRON HCL 4 MG/2ML IJ SOLN
INTRAMUSCULAR | Status: DC | PRN
  Administered 2012-03-17 (×2): 4 mg via INTRAVENOUS

## 2012-03-17 MED ORDER — MIDAZOLAM HCL 5 MG/5ML IJ SOLN
INTRAMUSCULAR | Status: DC | PRN
  Administered 2012-03-17: 1 mg via INTRAVENOUS

## 2012-03-17 MED ORDER — OMEGA-3 FATTY ACIDS 1000 MG PO CAPS: 1.0000 g | ORAL_CAPSULE | Freq: Every day | ORAL | Status: DC

## 2012-03-17 MED ORDER — SODIUM CHLORIDE 0.9 % IJ SOLN
3.0000 mL | Freq: Two times a day (BID) | INTRAMUSCULAR | Status: DC
  Administered 2012-03-18 – 2012-03-19 (×2): 3 mL via INTRAVENOUS

## 2012-03-17 MED ORDER — BUPIVACAINE HCL (PF) 0.5 % IJ SOLN
INTRAMUSCULAR | Status: DC | PRN
  Administered 2012-03-17: 10 mL

## 2012-03-17 MED ORDER — INSULIN ASPART 100 UNIT/ML ~~LOC~~ SOLN
0.0000 [IU] | Freq: Three times a day (TID) | SUBCUTANEOUS | Status: DC
  Administered 2012-03-18: 5 [IU] via SUBCUTANEOUS
  Administered 2012-03-18: 4 [IU] via SUBCUTANEOUS
  Administered 2012-03-18 – 2012-03-19 (×2): 3 [IU] via SUBCUTANEOUS
  Administered 2012-03-19: 5 [IU] via SUBCUTANEOUS
  Administered 2012-03-19: 3 [IU] via SUBCUTANEOUS
  Administered 2012-03-20: 2 [IU] via SUBCUTANEOUS

## 2012-03-17 MED ORDER — FENOFIBRATE 54 MG PO TABS
54.0000 mg | ORAL_TABLET | Freq: Every day | ORAL | Status: DC
  Administered 2012-03-18 – 2012-03-20 (×3): 54 mg via ORAL
  Filled 2012-03-17 (×4): qty 1

## 2012-03-17 MED ORDER — ACETAMINOPHEN 650 MG RE SUPP: 650.0000 mg | RECTAL | Status: DC | PRN

## 2012-03-17 MED ORDER — INSULIN ASPART 100 UNIT/ML ~~LOC~~ SOLN
4.0000 [IU] | Freq: Three times a day (TID) | SUBCUTANEOUS | Status: DC
  Administered 2012-03-18: 08:00:00 via SUBCUTANEOUS
  Administered 2012-03-18 – 2012-03-20 (×6): 4 [IU] via SUBCUTANEOUS

## 2012-03-17 MED ORDER — ROCURONIUM BROMIDE 100 MG/10ML IV SOLN
INTRAVENOUS | Status: DC | PRN
  Administered 2012-03-17: 50 mg via INTRAVENOUS

## 2012-03-17 MED ORDER — LACTATED RINGERS IV SOLN
INTRAVENOUS | Status: DC | PRN
  Administered 2012-03-17 (×2): via INTRAVENOUS

## 2012-03-17 MED ORDER — MORPHINE SULFATE (PF) 1 MG/ML IV SOLN
INTRAVENOUS | Status: DC
  Administered 2012-03-17: 2 mg via INTRAVENOUS
  Administered 2012-03-17: 17:00:00 via INTRAVENOUS
  Administered 2012-03-18: 0.5 mg via INTRAVENOUS
  Administered 2012-03-18: 1 mg via INTRAVENOUS
  Administered 2012-03-18: 4.5 mg via INTRAVENOUS

## 2012-03-17 MED ORDER — ZOLPIDEM TARTRATE 5 MG PO TABS
5.0000 mg | ORAL_TABLET | Freq: Every evening | ORAL | Status: DC | PRN
  Administered 2012-03-19 (×2): 5 mg via ORAL
  Filled 2012-03-17 (×2): qty 1

## 2012-03-17 MED ORDER — FLEET ENEMA 7-19 GM/118ML RE ENEM: 1.0000 | ENEMA | Freq: Once | RECTAL | Status: AC | PRN

## 2012-03-17 MED ORDER — ONDANSETRON HCL 4 MG/2ML IJ SOLN: 4.0000 mg | Freq: Four times a day (QID) | INTRAMUSCULAR | Status: DC | PRN

## 2012-03-17 MED ORDER — SIMVASTATIN 5 MG PO TABS
5.0000 mg | ORAL_TABLET | Freq: Every day | ORAL | Status: DC
  Administered 2012-03-18 – 2012-03-19 (×2): 5 mg via ORAL
  Filled 2012-03-17 (×4): qty 1

## 2012-03-17 MED ORDER — LIDOCAINE-EPINEPHRINE 1 %-1:100000 IJ SOLN
INTRAMUSCULAR | Status: DC | PRN
  Administered 2012-03-17: 10 mL

## 2012-03-17 MED ORDER — MENTHOL 3 MG MT LOZG
1.0000 | LOZENGE | OROMUCOSAL | Status: DC | PRN
  Filled 2012-03-17: qty 9

## 2012-03-17 MED ORDER — DIPHENHYDRAMINE HCL 12.5 MG/5ML PO ELIX: 12.5000 mg | ORAL_SOLUTION | Freq: Four times a day (QID) | ORAL | Status: DC | PRN

## 2012-03-17 MED ORDER — EZETIMIBE 10 MG PO TABS
5.0000 mg | ORAL_TABLET | Freq: Every day | ORAL | Status: DC
  Administered 2012-03-18: 10:00:00 via ORAL
  Administered 2012-03-19 – 2012-03-20 (×2): 5 mg via ORAL
  Filled 2012-03-17 (×3): qty 0.5

## 2012-03-17 MED ORDER — GLYBURIDE 5 MG PO TABS
5.0000 mg | ORAL_TABLET | Freq: Two times a day (BID) | ORAL | Status: DC
  Administered 2012-03-18 – 2012-03-20 (×5): 5 mg via ORAL
  Filled 2012-03-17 (×8): qty 1

## 2012-03-17 MED ORDER — METOPROLOL SUCCINATE ER 25 MG PO TB24
25.0000 mg | ORAL_TABLET | Freq: Every day | ORAL | Status: DC
  Administered 2012-03-18 – 2012-03-19 (×2): 25 mg via ORAL
  Filled 2012-03-17 (×3): qty 1

## 2012-03-17 MED ORDER — ESTRADIOL 1 MG PO TABS
1.0000 mg | ORAL_TABLET | Freq: Two times a day (BID) | ORAL | Status: DC
  Administered 2012-03-17 – 2012-03-20 (×6): 1 mg via ORAL
  Filled 2012-03-17 (×7): qty 1

## 2012-03-17 MED ORDER — HYDROCODONE-ACETAMINOPHEN 5-325 MG PO TABS
1.0000 | ORAL_TABLET | ORAL | Status: DC | PRN
  Administered 2012-03-18: 2 via ORAL
  Administered 2012-03-19 – 2012-03-20 (×4): 1 via ORAL
  Filled 2012-03-17: qty 1
  Filled 2012-03-17: qty 2
  Filled 2012-03-17: qty 1
  Filled 2012-03-17: qty 2
  Filled 2012-03-17: qty 1

## 2012-03-17 MED ORDER — THROMBIN 20000 UNITS EX KIT
PACK | CUTANEOUS | Status: DC | PRN
  Administered 2012-03-17: 12:00:00 via TOPICAL

## 2012-03-17 MED ORDER — HYDROMORPHONE HCL PF 1 MG/ML IJ SOLN
0.2500 mg | INTRAMUSCULAR | Status: DC | PRN
  Administered 2012-03-17 (×2): 0.5 mg via INTRAVENOUS

## 2012-03-17 MED ORDER — MORPHINE SULFATE (PF) 1 MG/ML IV SOLN
INTRAVENOUS | Status: AC
  Filled 2012-03-17: qty 25

## 2012-03-17 MED ORDER — FENTANYL CITRATE 0.05 MG/ML IJ SOLN
INTRAMUSCULAR | Status: DC | PRN
  Administered 2012-03-17: 50 ug via INTRAVENOUS
  Administered 2012-03-17: 100 ug via INTRAVENOUS
  Administered 2012-03-17: 50 ug via INTRAVENOUS
  Administered 2012-03-17: 100 ug via INTRAVENOUS
  Administered 2012-03-17: 50 ug via INTRAVENOUS

## 2012-03-17 MED ORDER — FUROSEMIDE 40 MG PO TABS
40.0000 mg | ORAL_TABLET | ORAL | Status: DC
  Filled 2012-03-17: qty 1

## 2012-03-17 MED ORDER — WHITE PETROLATUM GEL
Status: AC
  Administered 2012-03-17: 18:00:00
  Filled 2012-03-17: qty 5

## 2012-03-17 MED ORDER — ACETAMINOPHEN 325 MG PO TABS: 650.0000 mg | ORAL_TABLET | ORAL | Status: DC | PRN

## 2012-03-17 MED ORDER — ONDANSETRON HCL 4 MG/2ML IJ SOLN: 4.0000 mg | INTRAMUSCULAR | Status: DC | PRN

## 2012-03-17 MED ORDER — BISACODYL 10 MG RE SUPP: 10.0000 mg | Freq: Every day | RECTAL | Status: DC | PRN

## 2012-03-17 MED ORDER — PANTOPRAZOLE SODIUM 40 MG PO TBEC
40.0000 mg | DELAYED_RELEASE_TABLET | Freq: Every day | ORAL | Status: DC
  Administered 2012-03-17 – 2012-03-19 (×3): 40 mg via ORAL
  Filled 2012-03-17 (×3): qty 1

## 2012-03-17 MED ORDER — 0.9 % SODIUM CHLORIDE (POUR BTL) OPTIME
TOPICAL | Status: DC | PRN
  Administered 2012-03-17: 1000 mL

## 2012-03-17 MED ORDER — VECURONIUM BROMIDE 10 MG IV SOLR
INTRAVENOUS | Status: DC | PRN
  Administered 2012-03-17: 2 mg via INTRAVENOUS

## 2012-03-17 MED ORDER — ASPIRIN EC 81 MG PO TBEC
81.0000 mg | DELAYED_RELEASE_TABLET | Freq: Every day | ORAL | Status: DC
  Administered 2012-03-18 – 2012-03-20 (×3): 81 mg via ORAL
  Filled 2012-03-17 (×3): qty 1

## 2012-03-17 MED ORDER — PROPOFOL 10 MG/ML IV EMUL
INTRAVENOUS | Status: DC | PRN
  Administered 2012-03-17: 150 mg via INTRAVENOUS
  Administered 2012-03-17: 50 mg via INTRAVENOUS

## 2012-03-17 MED ORDER — INSULIN ASPART 100 UNIT/ML ~~LOC~~ SOLN: 0.0000 [IU] | Freq: Every day | SUBCUTANEOUS | Status: DC

## 2012-03-17 MED ORDER — LIDOCAINE HCL (CARDIAC) 20 MG/ML IV SOLN
INTRAVENOUS | Status: DC | PRN
  Administered 2012-03-17: 80 mg via INTRAVENOUS

## 2012-03-17 MED ORDER — SODIUM CHLORIDE 0.9 % IJ SOLN: 3.0000 mL | INTRAMUSCULAR | Status: DC | PRN

## 2012-03-17 MED ORDER — DIPHENHYDRAMINE HCL 50 MG/ML IJ SOLN: 12.5000 mg | Freq: Four times a day (QID) | INTRAMUSCULAR | Status: DC | PRN

## 2012-03-17 MED ORDER — NEOSTIGMINE METHYLSULFATE 1 MG/ML IJ SOLN
INTRAMUSCULAR | Status: DC | PRN
  Administered 2012-03-17: 4 mg via INTRAVENOUS

## 2012-03-17 MED ORDER — SENNA 8.6 MG PO TABS
1.0000 | ORAL_TABLET | Freq: Two times a day (BID) | ORAL | Status: DC
  Administered 2012-03-17 – 2012-03-20 (×6): 8.6 mg via ORAL
  Filled 2012-03-17 (×7): qty 1

## 2012-03-17 MED ORDER — OMEGA-3-ACID ETHYL ESTERS 1 G PO CAPS
1.0000 g | ORAL_CAPSULE | Freq: Every day | ORAL | Status: DC
  Administered 2012-03-18: 1 g via ORAL
  Filled 2012-03-17 (×4): qty 1

## 2012-03-17 MED ORDER — SENNOSIDES-DOCUSATE SODIUM 8.6-50 MG PO TABS: 1.0000 | ORAL_TABLET | Freq: Every evening | ORAL | Status: DC | PRN

## 2012-03-17 MED ORDER — DEXAMETHASONE SODIUM PHOSPHATE 4 MG/ML IJ SOLN
INTRAMUSCULAR | Status: DC | PRN
  Administered 2012-03-17: 4 mg via INTRAVENOUS

## 2012-03-17 MED ORDER — SODIUM CHLORIDE 0.9 % IJ SOLN: 9.0000 mL | INTRAMUSCULAR | Status: DC | PRN

## 2012-03-17 MED ORDER — ALPRAZOLAM 0.25 MG PO TABS
0.2500 mg | ORAL_TABLET | Freq: Three times a day (TID) | ORAL | Status: DC | PRN
  Administered 2012-03-17: 0.25 mg via ORAL
  Filled 2012-03-17: qty 1

## 2012-03-17 MED ORDER — DIAZEPAM 5 MG PO TABS
5.0000 mg | ORAL_TABLET | Freq: Four times a day (QID) | ORAL | Status: DC | PRN
  Administered 2012-03-17: 5 mg via ORAL
  Filled 2012-03-17: qty 1

## 2012-03-17 MED ORDER — DOCUSATE SODIUM 100 MG PO CAPS
100.0000 mg | ORAL_CAPSULE | Freq: Two times a day (BID) | ORAL | Status: DC
  Administered 2012-03-17 – 2012-03-19 (×4): 100 mg via ORAL
  Filled 2012-03-17 (×4): qty 1

## 2012-03-17 MED ORDER — SODIUM CHLORIDE 0.9 % IV SOLN
INTRAVENOUS | Status: AC
  Filled 2012-03-17: qty 500

## 2012-03-17 MED ORDER — PHENOL 1.4 % MT LIQD
1.0000 | OROMUCOSAL | Status: DC | PRN
  Administered 2012-03-19: 1 via OROMUCOSAL
  Filled 2012-03-17: qty 177

## 2012-03-17 MED ORDER — BACITRACIN 50000 UNITS IM SOLR
INTRAMUSCULAR | Status: AC
  Filled 2012-03-17: qty 1

## 2012-03-17 MED ORDER — SODIUM CHLORIDE 0.9 % IV SOLN
250.0000 mL | INTRAVENOUS | Status: DC
  Administered 2012-03-17: 250 mL via INTRAVENOUS

## 2012-03-17 MED ORDER — KCL IN DEXTROSE-NACL 20-5-0.45 MEQ/L-%-% IV SOLN
INTRAVENOUS | Status: DC
  Administered 2012-03-17: 21:00:00 via INTRAVENOUS
  Filled 2012-03-17 (×8): qty 1000

## 2012-03-17 MED ORDER — HYDROMORPHONE HCL PF 1 MG/ML IJ SOLN
INTRAMUSCULAR | Status: AC
  Administered 2012-03-17: 0.5 mg via INTRAVENOUS
  Filled 2012-03-17: qty 1

## 2012-03-17 MED ORDER — GLYBURIDE-METFORMIN 5-500 MG PO TABS: 1.0000 | ORAL_TABLET | Freq: Two times a day (BID) | ORAL | Status: DC

## 2012-03-17 MED ORDER — SODIUM CHLORIDE 0.9 % IR SOLN
Status: DC | PRN
  Administered 2012-03-17: 12:00:00

## 2012-03-17 MED ORDER — HEMOSTATIC AGENTS (NO CHARGE) OPTIME
TOPICAL | Status: DC | PRN
  Administered 2012-03-17: 1 via TOPICAL

## 2012-03-17 MED ORDER — OXYCODONE-ACETAMINOPHEN 5-325 MG PO TABS
1.0000 | ORAL_TABLET | ORAL | Status: DC | PRN
  Administered 2012-03-18: 2 via ORAL
  Filled 2012-03-17: qty 2

## 2012-03-17 MED ORDER — METFORMIN HCL 500 MG PO TABS
500.0000 mg | ORAL_TABLET | Freq: Two times a day (BID) | ORAL | Status: DC
  Administered 2012-03-18 – 2012-03-20 (×5): 500 mg via ORAL
  Filled 2012-03-17 (×8): qty 1

## 2012-03-17 MED ORDER — ALUM & MAG HYDROXIDE-SIMETH 200-200-20 MG/5ML PO SUSP: 30.0000 mL | Freq: Four times a day (QID) | ORAL | Status: DC | PRN

## 2012-03-17 MED ORDER — CEFAZOLIN SODIUM 1-5 GM-% IV SOLN
INTRAVENOUS | Status: AC
  Administered 2012-03-17: 1 g via INTRAVENOUS
  Filled 2012-03-17: qty 50

## 2012-03-17 MED ORDER — NALOXONE HCL 0.4 MG/ML IJ SOLN: 0.4000 mg | INTRAMUSCULAR | Status: DC | PRN

## 2012-03-17 MED ORDER — PANTOPRAZOLE SODIUM 40 MG IV SOLR: 40.0000 mg | Freq: Every day | INTRAVENOUS | Status: DC

## 2012-03-17 MED ORDER — CEFAZOLIN SODIUM 1-5 GM-% IV SOLN
1.0000 g | Freq: Three times a day (TID) | INTRAVENOUS | Status: AC
  Administered 2012-03-17 – 2012-03-18 (×2): 1 g via INTRAVENOUS
  Filled 2012-03-17 (×3): qty 50

## 2012-03-17 SURGICAL SUPPLY — 77 items
ADH SKN CLS APL DERMABOND .7 (GAUZE/BANDAGES/DRESSINGS) ×1
APL SKNCLS STERI-STRIP NONHPOA (GAUZE/BANDAGES/DRESSINGS) ×2
BAG DECANTER FOR FLEXI CONT (MISCELLANEOUS) ×2 IMPLANT
BENZOIN TINCTURE PRP APPL 2/3 (GAUZE/BANDAGES/DRESSINGS) ×2 IMPLANT
BLADE SURG ROTATE 9660 (MISCELLANEOUS) IMPLANT
BONE VOID FILLER STRIP 10CC (Bone Implant) ×1 IMPLANT
BUR MATCHSTICK NEURO 3.0 LAGG (BURR) ×2 IMPLANT
BUR PRECISION FLUTE 5.0 (BURR) ×2 IMPLANT
CANISTER SUCTION 2500CC (MISCELLANEOUS) ×2 IMPLANT
CLOTH BEACON ORANGE TIMEOUT ST (SAFETY) ×2 IMPLANT
CONT SPEC 4OZ CLIKSEAL STRL BL (MISCELLANEOUS) ×4 IMPLANT
COVER BACK TABLE 24X17X13 BIG (DRAPES) IMPLANT
COVER TABLE BACK 60X90 (DRAPES) ×2 IMPLANT
DERMABOND ADVANCED (GAUZE/BANDAGES/DRESSINGS) ×1
DERMABOND ADVANCED .7 DNX12 (GAUZE/BANDAGES/DRESSINGS) ×1 IMPLANT
DRAPE C-ARM 42X72 X-RAY (DRAPES) ×4 IMPLANT
DRAPE LAPAROTOMY 100X72X124 (DRAPES) ×2 IMPLANT
DRAPE POUCH INSTRU U-SHP 10X18 (DRAPES) ×2 IMPLANT
DRAPE SURG 17X23 STRL (DRAPES) ×2 IMPLANT
DRESSING TELFA 8X3 (GAUZE/BANDAGES/DRESSINGS) ×2 IMPLANT
DURAPREP 26ML APPLICATOR (WOUND CARE) ×2 IMPLANT
ELECT REM PT RETURN 9FT ADLT (ELECTROSURGICAL) ×2
ELECTRODE REM PT RTRN 9FT ADLT (ELECTROSURGICAL) ×1 IMPLANT
GAUZE SPONGE 4X4 16PLY XRAY LF (GAUZE/BANDAGES/DRESSINGS) IMPLANT
GLOVE BIO SURGEON STRL SZ8 (GLOVE) ×4 IMPLANT
GLOVE BIOGEL PI IND STRL 8 (GLOVE) ×2 IMPLANT
GLOVE BIOGEL PI IND STRL 8.5 (GLOVE) ×2 IMPLANT
GLOVE BIOGEL PI INDICATOR 8 (GLOVE) ×3
GLOVE BIOGEL PI INDICATOR 8.5 (GLOVE) ×2
GLOVE ECLIPSE 7.5 STRL STRAW (GLOVE) ×3 IMPLANT
GLOVE ECLIPSE 8.0 STRL XLNG CF (GLOVE) ×4 IMPLANT
GLOVE ECLIPSE 8.5 STRL (GLOVE) ×1 IMPLANT
GLOVE EXAM NITRILE LRG STRL (GLOVE) IMPLANT
GLOVE EXAM NITRILE MD LF STRL (GLOVE) ×1 IMPLANT
GLOVE EXAM NITRILE XL STR (GLOVE) IMPLANT
GLOVE EXAM NITRILE XS STR PU (GLOVE) IMPLANT
GOWN BRE IMP SLV AUR LG STRL (GOWN DISPOSABLE) IMPLANT
GOWN BRE IMP SLV AUR XL STRL (GOWN DISPOSABLE) ×5 IMPLANT
GOWN STRL REIN 2XL LVL4 (GOWN DISPOSABLE) ×5 IMPLANT
KIT BASIN OR (CUSTOM PROCEDURE TRAY) ×2 IMPLANT
KIT INFUSE SMALL (Orthopedic Implant) ×1 IMPLANT
KIT POSITION SURG JACKSON T1 (MISCELLANEOUS) ×2 IMPLANT
KIT ROOM TURNOVER OR (KITS) ×2 IMPLANT
MILL MEDIUM DISP (BLADE) ×2 IMPLANT
NDL HYPO 25X1 1.5 SAFETY (NEEDLE) ×1 IMPLANT
NDL SPNL 18GX3.5 QUINCKE PK (NEEDLE) IMPLANT
NEEDLE HYPO 25X1 1.5 SAFETY (NEEDLE) ×2 IMPLANT
NEEDLE SPNL 18GX3.5 QUINCKE PK (NEEDLE) ×2 IMPLANT
NS IRRIG 1000ML POUR BTL (IV SOLUTION) ×2 IMPLANT
PACK LAMINECTOMY NEURO (CUSTOM PROCEDURE TRAY) ×2 IMPLANT
PAD ARMBOARD 7.5X6 YLW CONV (MISCELLANEOUS) ×5 IMPLANT
PATTIES SURGICAL .5 X.5 (GAUZE/BANDAGES/DRESSINGS) IMPLANT
PATTIES SURGICAL .5 X1 (DISPOSABLE) IMPLANT
PATTIES SURGICAL 1X1 (DISPOSABLE) IMPLANT
PEEK PLIF NOVEL 9X25X8MM (Peek) ×2 IMPLANT
ROD 35MM (Rod) ×2 IMPLANT
SCREW 40MM (Screw) ×2 IMPLANT
SCREW 45MM (Screw) ×2 IMPLANT
SCREW SET SPINAL STD HEXALOBE (Screw) ×4 IMPLANT
SLEEVE SURGEON STRL (DRAPES) ×1 IMPLANT
SPONGE GAUZE 4X4 12PLY (GAUZE/BANDAGES/DRESSINGS) ×1 IMPLANT
SPONGE LAP 4X18 X RAY DECT (DISPOSABLE) IMPLANT
SPONGE SURGIFOAM ABS GEL 100 (HEMOSTASIS) ×1 IMPLANT
STAPLER SKIN PROX WIDE 3.9 (STAPLE) IMPLANT
STRIP CLOSURE SKIN 1/2X4 (GAUZE/BANDAGES/DRESSINGS) ×2 IMPLANT
SUT VIC AB 1 CT1 18XBRD ANBCTR (SUTURE) ×2 IMPLANT
SUT VIC AB 1 CT1 8-18 (SUTURE) ×4
SUT VIC AB 2-0 CT1 18 (SUTURE) ×3 IMPLANT
SUT VIC AB 3-0 SH 8-18 (SUTURE) ×4 IMPLANT
SYR 20ML ECCENTRIC (SYRINGE) ×2 IMPLANT
SYR 3ML LL SCALE MARK (SYRINGE) ×7 IMPLANT
SYR 5ML LL (SYRINGE) IMPLANT
TOWEL OR 17X24 6PK STRL BLUE (TOWEL DISPOSABLE) ×2 IMPLANT
TOWEL OR 17X26 10 PK STRL BLUE (TOWEL DISPOSABLE) ×2 IMPLANT
TRAP SPECIMEN MUCOUS 40CC (MISCELLANEOUS) ×2 IMPLANT
TRAY FOLEY CATH 14FRSI W/METER (CATHETERS) ×2 IMPLANT
WATER STERILE IRR 1000ML POUR (IV SOLUTION) ×2 IMPLANT

## 2012-03-17 NOTE — Anesthesia Postprocedure Evaluation (Signed)
  Anesthesia Post-op Note  Patient: Kristy [name]  Procedure(s) Performed: Procedure(s) (LRB): POSTERIOR LUMBAR FUSION 1 LEVEL (N/A)  Patient Location: PACU  Anesthesia Type: General  Level of Consciousness: awake and alert   Airway and Oxygen Therapy: Patient Spontanous Breathing and Patient connected to nasal cannula oxygen  Post-op Pain: mild  Post-op Assessment: Post-op Vital signs reviewed  Post-op Vital Signs: Reviewed  Complications: No apparent anesthesia complications

## 2012-03-17 NOTE — Transfer of Care (Signed)
Immediate Anesthesia Transfer of Care Note  Patient: Kristy [name]  Procedure(s) Performed: Procedure(s) (LRB): POSTERIOR LUMBAR FUSION 1 LEVEL (N/A)  Patient Location: PACU  Anesthesia Type: General  Level of Consciousness: awake, alert  and oriented  Airway & Oxygen Therapy: Patient Spontanous Breathing and Patient connected to nasal cannula oxygen  Post-op Assessment: Report given to PACU RN, Post -op Vital signs reviewed and stable and Patient moving all extremities X 4  Post vital signs: Reviewed and stable  Complications: No apparent anesthesia complications

## 2012-03-17 NOTE — Anesthesia Preprocedure Evaluation (Signed)
Anesthesia Evaluation  Patient identified by MRN, date of birth, ID band Patient awake and Patient confused    Airway Mallampati: II      Dental   Pulmonary neg pulmonary ROS,          Cardiovascular hypertension, Pt. on medications Rhythm:Regular Rate:Normal     Neuro/Psych    GI/Hepatic Neg liver ROS, hiatal hernia,   Endo/Other  Diabetes mellitus-  Renal/GU negative Renal ROS     Musculoskeletal   Abdominal   Peds  Hematology negative hematology ROS (+)   Anesthesia Other Findings   Reproductive/Obstetrics                           Anesthesia Physical Anesthesia Plan  ASA: III  Anesthesia Plan: General   Post-op Pain Management:    Induction: Intravenous  Airway Management Planned: Oral ETT  Additional Equipment:   Intra-op Plan:   Post-operative Plan: Extubation in OR  Informed Consent:   Plan Discussed with: CRNA  Anesthesia Plan Comments:         Anesthesia Quick Evaluation

## 2012-03-17 NOTE — Op Note (Signed)
03/17/2012  3:28 PM  PATIENT:  Kristy [name]  78 y.o. female  PRE-OPERATIVE DIAGNOSIS:  Spondylolisthesis, Lumbar stenosis, Spondylosis, radiculopathy L 4 / 5  POST-OPERATIVE DIAGNOSIS:  Spondylolisthesis, Lumbar stenosis, Spondylosis, L 4 / 5  PROCEDURE:  Procedure(s) (LRB): POSTERIOR LUMBAR Decompression and FUSION PLIF with Pedicle screws and posterolateral arthrodesis L 4 / 5  (N/A)  SURGEON:  Surgeon(s) and Role:    * Maeola Harman, MD - Primary    * Temple Pacini, MD - Assisting  PHYSICIAN ASSISTANT:   ASSISTANTS: Poteat, RN   ANESTHESIA:   general  EBL:  Total I/O In: 2000 [I.V.:2000] Out: 525 [Urine:325; Blood:200]  BLOOD ADMINISTERED:none  DRAINS: (Medium) Hemovact drain(s) in the Epidural space with  Suction Open   LOCAL MEDICATIONS USED:  LIDOCAINE   SPECIMEN:  No Specimen  DISPOSITION OF SPECIMEN:  N/A  COUNTS:  YES  TOURNIQUET:  * No tourniquets in log *  DICTATION: Patient is 78 year old woman with mobile spondylolisthesis of L4 on L5 with lumbar stenosis. She has a severe left L5 radiculopathy. It was elected to take her to surgery for decompression and fusion at this level.   Procedure: Patient was placed in a prone position on the [place] table after smooth and uncomplicated induction of general endotracheal anesthesia. Her low back was prepped and draped in usual sterile fashion with DuraPrep. Area of incision was infiltrated with local lidocaine. Incision was made to the lumbodorsal fascia was incised and exposure was performed of the L4 through L5 spinous processes laminae facet joint and transverse processes. Intraoperative x-ray was obtained which confirmed correct orientation. A total laminectomy of L4 was performed with disarticulation of the facet joints at this level and thorough decompression was performed of both L4 and L5 nerve roots along with the common dural tube. This decompression was more involved than would be typical of that performed for  PLIF alone and included painstaking dissection of adherent ligament compressing the thecal sac and wide decompression of all neural elements. There was a significant amount of bone adherent to to the dura which was carefully decompressed under Loupe magnification with a variety of curettes and dissectors. A thorough discectomy was initially performed on the left with preparation of the endplates for grafting a trial spacer was placed this level and a thorough discectomy was performed on the right as well. Bone autograft was packed within the interspace bilaterally along with small BMP kit and NexOss bone graft extender. Bilateral median 8 mm peek cages were packed with BMP and extender and was inserted the interspace and countersunk appropriately along with 6 cc of morselized bone autograft. The posterolateral region was extensively decorticated and pedicle probes were placed at L4 and L5 bilaterally. Intraoperative fluoroscopy confirmed correct orientationin the AP and lateral plane. 40 x 5.5 mm pedicle screws were placed at L5 bilaterally and 45 x 5.5 mm screws placed at L4 bilaterally final x-rays demonstrated well-positioned interbody grafts and pedicle screw fixation. A 35 mm lordotic rod was placed on the right and a 40 mm rod was placed on the left locked down in situ and the posterolateral region was packed with the remaining BMP and bone graft extender on the right and BMP and NexOss on the left. The wound was irrigated and a medium Hemovac drain was placed in the epidural space. Fascia was closed with 1 Vicryl sutures skin edges were reapproximated 2 and 3-0 Vicryl sutures. The wound is dressed with benzoin Steri-Strips Telfa gauze and tape the patient  was extubated in the operating room and taken to recovery in stable satisfactory condition. She tolerated the operation well counts were correct at the end of the case.   PLAN OF CARE: Admit to inpatient   PATIENT DISPOSITION:  PACU - hemodynamically  stable.   Delay start of Pharmacological VTE agent (>24hrs) due to surgical blood loss or risk of bleeding: yes

## 2012-03-17 NOTE — Progress Notes (Signed)
Patient had an small episode of vomiting right after med adminstartion. Red in color. She did have red jello a hr ago. No c/o of nausea. Will continue to monitor. [facility] RN

## 2012-03-17 NOTE — Anesthesia Procedure Notes (Signed)
Procedure Name: Intubation Date/Time: 03/17/2012 11:57 AM Performed by: Elon Alas Pre-anesthesia Checklist: Patient identified, Timeout performed, Emergency Drugs available, Suction available and Patient being monitored Patient Re-evaluated:Patient Re-evaluated prior to inductionOxygen Delivery Method: Circle system utilized Preoxygenation: Pre-oxygenation with 100% oxygen Intubation Type: IV induction and Cricoid Pressure applied Ventilation: Mask ventilation without difficulty and Oral airway inserted - appropriate to patient size Laryngoscope Size: Mac, 3, Miller and 2 Grade View: Grade IV Tube type: Oral Tube size: 7.5 mm Number of attempts: 2 Airway Equipment and Method: Stylet Placement Confirmation: ETT inserted through vocal cords under direct vision,  positive ETCO2 and breath sounds checked- equal and bilateral Secured at: 22 cm Tube secured with: Tape Dental Injury: Teeth and Oropharynx as per pre-operative assessment and Injury to lip  Comments: DLx1 with MAC 3 grade 4 view, DLx1 with Miller 2 ETT passed atraumatically.  Confirmed placement.

## 2012-03-17 NOTE — Interval H&P Note (Signed)
History and Physical Interval Note:  03/17/2012 11:11 AM  Kristy [name]  has presented today for surgery, with the diagnosis of Spondylolisthesis, Lumbar stenosis, Spondylosis, radiculopathy  The various methods of treatment have been discussed with the patient and family. After consideration of risks, benefits and other options for treatment, the patient has consented to  Procedure(s) (LRB): POSTERIOR LUMBAR FUSION 1 LEVEL (N/A) as a surgical intervention .  The patient's history has been reviewed, patient examined, no change in status, stable for surgery.  I have reviewed the patients' chart and labs.  Questions were answered to the patient's satisfaction.     Kristy [name] D  Date of Initial H&P: 03/16/2012  History reviewed, patient examined, no change in status, stable for surgery.

## 2012-03-17 NOTE — Progress Notes (Signed)
Pt. With bruise to right side lower lip.  CRNA placed vaseline on lip.  Dr. Ivin Booty is aware.

## 2012-03-17 NOTE — Preoperative (Signed)
Beta Blockers   Reason not to administer Beta Blockers:Not Applicable, pt took 6/18

## 2012-03-18 LAB — GLUCOSE, CAPILLARY

## 2012-03-18 MED FILL — Sodium Chloride IV Soln 0.9%: INTRAVENOUS | Qty: 1000 | Status: AC

## 2012-03-18 MED FILL — Heparin Sodium (Porcine) Inj 1000 Unit/ML: INTRAMUSCULAR | Qty: 30 | Status: AC

## 2012-03-18 NOTE — Progress Notes (Signed)
UR COMPLETED  

## 2012-03-18 NOTE — Evaluation (Signed)
Occupational Therapy Evaluation Patient Details Name: Kristy [name] MRN: [id number] DOB: 10/15/1927 Today's Date: 03/18/2012 Time: 3086-5784 OT Time Calculation (min): 31 min  OT Assessment / Plan / Recommendation Clinical Impression  Pleasant 78 yr old female admitted post L4-L5 fusion now presents with increased pain and decreased mobility and independence with basic selfcare tasks.  Feel she will benefit from acute OT services to address these issues in order to increase overall independence to at least supervision level for discharge back home with her husband and HHOT to further progress to modified independent level.    OT Assessment  Patient needs continued OT Services    Follow Up Recommendations  Home health OT       Equipment Recommendations  None recommended by OT       Frequency  Min 2X/week    Precautions / Restrictions Precautions Precautions: Back;Fall Required Braces or Orthoses: Spinal Brace Spinal Brace: Lumbar corset;Applied in sitting position Restrictions Weight Bearing Restrictions: No   Pertinent Vitals/Pain Pain 3/10 per report during session.  O2 sats 95 % on room air.    ADL  Eating/Feeding: Simulated;Independent Where Assessed - Eating/Feeding: Chair Grooming: Simulated;Supervision/safety Where Assessed - Grooming: Supported sitting Upper Body Bathing: Simulated;Supervision/safety Where Assessed - Upper Body Bathing: Supported sitting Lower Body Bathing: Simulated;Minimal assistance Where Assessed - Lower Body Bathing: Supported sit to stand Upper Body Dressing: Simulated;Minimal assistance Where Assessed - Upper Body Dressing: Unsupported sitting Lower Body Dressing: Performed;Moderate assistance;Other (comment) (using reacher and sockaide) Where Assessed - Lower Body Dressing: Sopported sit to stand Toilet Transfer: Simulated;Minimal assistance Toilet Transfer Method: Stand pivot Acupuncturist: Other (comment) (To EOB pt still  with cathetor.) Toileting - Clothing Manipulation and Hygiene: Simulated;Minimal assistance Where Assessed - Engineer, mining and Hygiene: Sit to stand from 3-in-1 or toilet Tub/Shower Transfer Method: Not assessed Equipment Used: Back brace;Rolling walker Transfers/Ambulation Related to ADLs: Pt min asist with use of RW to take a few steps from the bedside chair to the edg of the bed.  Pt with increased pain when attempting this. ADL Comments: Began educating pt on use of AE for LB selfcare as well as back precautions.  Needs max instructional cueing to utilize AE to donn and doff socks.  Also limited by pain.  Pt reports already having a reacher at home as well.      OT Diagnosis: Generalized weakness;Acute pain  OT Problem List: Decreased strength;Decreased activity tolerance;Impaired balance (sitting and/or standing);Decreased knowledge of use of DME or AE;Decreased knowledge of precautions;Pain OT Treatment Interventions: Self-care/ADL training;Therapeutic activities;DME and/or AE instruction;Balance training;Patient/family education   OT Goals Acute Rehab OT Goals OT Goal Formulation: With patient/family Time For Goal Achievement: 03/25/12 Potential to Achieve Goals: Good ADL Goals Pt Will Perform Grooming: with supervision;Standing at sink ADL Goal: Grooming - Progress: Goal set today Pt Will Perform Lower Body Bathing: with supervision;with adaptive equipment;Sit to stand from bed ADL Goal: Lower Body Bathing - Progress: Goal set today Pt Will Perform Upper Body Dressing: with supervision;Sitting, bed;Unsupported (donn TLSO ) ADL Goal: Upper Body Dressing - Progress: Goal set today Pt Will Perform Lower Body Dressing: with supervision;with adaptive equipment;Sit to stand from chair ADL Goal: Lower Body Dressing - Progress: Goal set today Pt Will Transfer to Toilet: with supervision;with DME;3-in-1;Maintaining back safety precautions ADL Goal: Toilet Transfer -  Progress: Goal set today Pt Will Perform Tub/Shower Transfer: Shower transfer;with supervision;with DME;Shower seat without back;Grab bars ADL Goal: Web designer - Progress: Goal set today Miscellaneous OT Goals  Miscellaneous OT Goal #1: Pt will independently verbalize 3/3 back precautions. OT Goal: Miscellaneous Goal #1 - Progress: Goal set today  Visit Information  Last OT Received On: 03/18/12 Assistance Needed: +1    Subjective Data  Subjective: "I thinnk I'm about ready to get back in the bed." Patient Stated Goal: Get her back pain better and go home with her husband.   Prior Functioning  Home Living Lives With: Spouse Available Help at Discharge: Family Type of Home: House Home Access: Level entry Home Layout: One level Bathroom Shower/Tub: Walk-in shower;Tub only;Door [employer] Toilet: Handicapped height Bathroom Accessibility: Yes How Accessible: Accessible via walker Home Adaptive Equipment: Walker - rolling;Wheelchair - manual;Straight cane;Bedside commode/3-in-1;Hand-held shower hose;Grab bars in shower;Shower chair with back Prior Function Level of Independence: Independent with assistive device(s) (using RW ~3 weeks) Able to Take Stairs?: Yes Driving: Yes (Hasn't driven in ~3 weeks) Vocation: Retired Musician: No difficulties Dominant Hand: Right    Cognition  Overall Cognitive Status: Appears within functional limits for tasks assessed/performed Arousal/Alertness: Awake/alert Orientation Level: Appears intact for tasks assessed Behavior During Session: [facility] for tasks performed    Extremity/Trunk Assessment Right Upper Extremity Assessment RUE ROM/Strength/Tone: Unable to fully assess;Due to precautions (AROM WFLs) RUE Sensation: WFL - Light Touch RUE Coordination: WFL - gross/fine motor Left Upper Extremity Assessment LUE ROM/Strength/Tone: Unable to fully assess;Due to precautions (AROM WFLS) LUE Sensation: WFL - Light Touch LUE  Coordination: WFL - gross/fine motor Trunk Assessment Trunk Assessment: Normal   Mobility Bed Mobility Bed Mobility: Sit to Sidelying Left Sit to Sidelying Left: 3: Mod assist Transfers Transfers: Sit to Stand Sit to Stand: With upper extremity assist;With armrests;From chair/3-in-1 Stand to Sit: With upper extremity assist;With armrests;To chair/3-in-1      Balance Balance Balance Assessed: Yes Static Sitting Balance Static Sitting - Balance Support: Right upper extremity supported;Left upper extremity supported Static Sitting - Level of Assistance: 5: Stand by assistance Static Standing Balance Static Standing - Balance Support: Right upper extremity supported;Left upper extremity supported Static Standing - Level of Assistance: 4: Min assist  End of Session OT - End of Session Activity Tolerance: Patient limited by pain Patient left: in bed;with call bell/phone within reach;with family/visitor present   Aarav Burgett OTR/L 03/18/2012, 12:18 PM Pager number [phone]

## 2012-03-18 NOTE — Evaluation (Signed)
Physical Therapy Evaluation Patient Details Name: Kristy [name] MRN: [id number] DOB: 02/28/1928 Today's Date: 03/18/2012 Time: 0800-0830 PT Time Calculation (min): 30 min  PT Assessment / Plan / Recommendation Clinical Impression  Pt is 78 y/o female admitted for s/p PLIF.  Pt limited due to nausea and weakness.  Pt will benefit from acute PT services to improve overall mobility and prepare for safe d/c home.    PT Assessment  Patient needs continued PT services    Follow Up Recommendations  Home health PT (will continue to assess d/c plans )    Barriers to Discharge        lEquipment Recommendations  None recommended by PT    Recommendations for Other Services     Frequency Min 5X/week    Precautions / Restrictions Precautions Precautions: Back Precaution Booklet Issued: Yes (comment) Precaution Comments: Handout given and reviewed 3/3 back precautions. Required Braces or Orthoses: Spinal Brace Spinal Brace: Lumbar corset;Applied in sitting position   Pertinent Vitals/Pain 6/10 back with ambulation      Mobility  Transfers Transfers: Sit to Stand;Stand to Sit Sit to Stand: 3: Mod assist;From bed Stand to Sit: 4: Min assist;To chair/3-in-1 Details for Transfer Assistance: (A) to initiate transfer and slowly descend to chair.  Cues for hand placement.  Pt tends to extend right LE prior to sitting. Ambulation/Gait Ambulation/Gait Assistance: 4: Min assist Ambulation Distance (Feet): 40 Feet Assistive device: Rolling walker Ambulation/Gait Assistance Details: (A) to maintain balance and manage RW.  Cues for step sequence and upright posture. Gait Pattern: Decreased stride length;Shuffle;Decreased trunk rotation    Exercises     PT Diagnosis: Difficulty walking;Generalized weakness;Acute pain  PT Problem List: Decreased strength;Decreased activity tolerance;Decreased mobility;Decreased knowledge of use of DME;Decreased knowledge of precautions;Pain PT Treatment  Interventions: DME instruction;Gait training;Functional mobility training;Therapeutic activities;Therapeutic exercise;Balance training;Neuromuscular re-education;Cognitive remediation;Patient/family education   PT Goals Acute Rehab PT Goals PT Goal Formulation: With patient/family Time For Goal Achievement: 03/25/12 Potential to Achieve Goals: Good Pt will Roll Supine to Right Side: with modified independence PT Goal: Rolling Supine to Right Side - Progress: Goal set today Pt will go Supine/Side to Sit: with modified independence PT Goal: Supine/Side to Sit - Progress: Goal set today Pt will go Sit to Stand: with modified independence PT Goal: Sit to Stand - Progress: Goal set today Pt will go Stand to Sit: with modified independence PT Goal: Stand to Sit - Progress: Goal set today Pt will Ambulate: >150 feet;with modified independence;with rolling walker PT Goal: Ambulate - Progress: Goal set today Additional Goals Additional Goal #1: Pt will be able to recall and adhere to 3/3 back precautions. PT Goal: Additional Goal #1 - Progress: Goal set today  Visit Information  Last PT Received On: 03/18/12 Assistance Needed: +1    Subjective Data  Subjective: "I'm feeling a little nauseated." Patient Stated Goal: Did not set   Prior Functioning  Home Living Lives With: Spouse Available Help at Discharge: Family Type of Home: House Home Access: Level entry Home Layout: One level Bathroom Shower/Tub: Walk-in shower;Tub only;Door [employer] Toilet: Handicapped height Bathroom Accessibility: Yes How Accessible: Accessible via walker Home Adaptive Equipment: Walker - rolling;Wheelchair - manual;Straight cane;Bedside commode/3-in-1;Hand-held shower hose;Grab bars in shower Prior Function Level of Independence: Independent with assistive device(s) (using RW ~3 weeks) Able to Take Stairs?: Yes Driving: Yes (Hasn't driven in ~3 weeks) Vocation: Retired Musician: No  difficulties Dominant Hand: Right    Cognition  Overall Cognitive Status: Appears within functional limits for  tasks assessed/performed Arousal/Alertness: Awake/alert Orientation Level: Appears intact for tasks assessed Behavior During Session: [facility] for tasks performed    Extremity/Trunk Assessment Right Upper Extremity Assessment RUE ROM/Strength/Tone: Within functional levels RUE Sensation: WFL - Light Touch RUE Coordination: WFL - gross/fine motor Left Upper Extremity Assessment LUE ROM/Strength/Tone: Within functional levels LUE Sensation: WFL - Light Touch LUE Coordination: WFL - gross/fine motor Right Lower Extremity Assessment RLE ROM/Strength/Tone: Unable to fully assess;Due to pain RLE Sensation: WFL - Light Touch Left Lower Extremity Assessment LLE ROM/Strength/Tone: Unable to fully assess;Due to pain LLE Sensation: WFL - Light Touch   Balance Balance Balance Assessed: Yes Static Sitting Balance Static Sitting - Balance Support: Feet supported Static Sitting - Level of Assistance: 5: Stand by assistance Static Sitting - Comment/# of Minutes:  (Pt sitting EOB when entering)  End of Session PT - End of Session Equipment Utilized During Treatment: Gait belt;Back brace Activity Tolerance: Patient tolerated treatment well (mild nausea and dizziness reported during ambulation) Patient left: in chair;with call bell/phone within reach Nurse Communication: Mobility status   Mirza Kidney 03/18/2012, 8:43 AM [name], PT DPT [phone]

## 2012-03-18 NOTE — Care Management Note (Signed)
    Page 1 of 1   03/20/2012     2:34:53 PM   CARE MANAGEMENT NOTE 03/20/2012  Patient:  VELLA, COLQUITT   Account Number:  [id number]  Date Initiated:  03/18/2012  Documentation initiated by:  Onnie Boer  Subjective/Objective Assessment:   PT WAS ADMITTED FOR SURGERY     Action/Plan:   PROGRESSION OF CARE AND DISCHARGE PLANNING   Anticipated DC Date:  03/21/2012   Anticipated DC Plan:  HOME W HOME HEALTH SERVICES      DC Planning Services  CM consult      Choice offered to / List presented to:  C-1 Patient        HH arranged  HH-2 PT  HH-3 OT      [facility] agency  [facility]   Status of service:  Completed, signed off Medicare Important Message given?   (If response is "NO", the following Medicare IM given date fields will be blank) Date Medicare IM given:   Date Additional Medicare IM given:    Discharge Disposition:  HOME W HOME HEALTH SERVICES  Per UR Regulation:  Reviewed for med. necessity/level of care/duration of stay  If discussed at Long Length of Stay Meetings, dates discussed:    Comments:  03/20/12 Onnie Boer, RN, BSN 1433 PT WAS DC'D TO HOME WITH [facility] WITH [facility].  PT STATES THAT SHE ALREADY HAS A RW AND A 3N1  03/18/12 Onnie Boer, RN, BSN 1000 PT WAS ADMITTED FOR A PLIF.  PTA PT WAS AT HOME WITH FAMILY/SELF CARE.  WILL F/U WITH PT/OT EVAL AND DC NEEDS

## 2012-03-18 NOTE — Clinical Social Work Note (Signed)
CSW received consult for SNF. PT/OT are receommending home health services. Discussed pt in progression. RNCM is aware and following. CSW is signing off as no further needs identified. Please reconsult if a need arises prior to discharge.   Dede Query, MSW, Theresia Majors [phone]

## 2012-03-18 NOTE — Progress Notes (Signed)
Subjective: Patient reports doing OK  Objective: Vital signs in last 24 hours: Temp:  [97.2 F (36.2 C)-98 F (36.7 C)] 97.6 F (36.4 C) (06/19 0600) Pulse Rate:  [51-83] 72  (06/19 0600) Resp:  [11-23] 17  (06/19 0600) BP: (91-154)/(55-78) 124/74 mmHg (06/19 0600) SpO2:  [96 %-100 %] 97 % (06/19 0600) Weight:  [78 kg (171 lb 15.3 oz)] 78 kg (171 lb 15.3 oz) (06/18 1900)  Intake/Output from previous day: 06/18 0701 - 06/19 0700 In: 2000 [I.V.:2000] Out: 1881 [Urine:1400; Emesis/NG output:1; Drains:280; Blood:200] Intake/Output this shift:    Physical Exam: Full strength both legs.  Dressing CDI  Lab Results: No results found for this basename: WBC:2,HGB:2,HCT:2,PLT:2 in the last 72 hours BMET No results found for this basename: NA:2,K:2,CL:2,CO2:2,GLUCOSE:2,BUN:2,CREATININE:2,CALCIUM:2 in the last 72 hours  Studies/Results: Dg Lumbar Spine 2-3 Views  03/17/2012  *RADIOLOGY REPORT*  Clinical Data: L4-5 PLIF.  OPERATIVE LUMBAR SPINE - 2-3 VIEW  Comparison:  MRI lumbar spine 02/04/2012 [facility].  Findings: 2 spot images from the C-arm fluoroscopic device were submitted for interpretation post-operatively, demonstrating bilateral pedicle screws at L4 and L5 and interbody fusion plugs which are appropriately positioned in the L4-5 disc space.  IMPRESSION: L4-5 PLIF.  Original Report Authenticated By: Arnell Sieving, M.D.   Dg Lumbar Spine 1 View  03/17/2012  *RADIOLOGY REPORT*  Clinical Data: L4-5 posterior fusion.  LUMBAR SPINE - 1 VIEW  Comparison: 02/04/2012  Findings: Posterior surgical instruments are located posterior to the L4 and L5 vertebral bodies.  IMPRESSION: Intraoperative localization as above.  Original Report Authenticated By: Cyndie Chime, M.D.   Dg C-arm 1-60 Min  03/17/2012  *RADIOLOGY REPORT*  Clinical Data: L4-5 PLIF.  OPERATIVE LUMBAR SPINE - 2-3 VIEW  Comparison:  MRI lumbar spine 02/04/2012 [facility].  Findings: 2 spot images from the  C-arm fluoroscopic device were submitted for interpretation post-operatively, demonstrating bilateral pedicle screws at L4 and L5 and interbody fusion plugs which are appropriately positioned in the L4-5 disc space.  IMPRESSION: L4-5 PLIF.  Original Report Authenticated By: Arnell Sieving, M.D.    Assessment/Plan: Doing well.  Mobilize.  DC PCA.    LOS: 1 day    Dorian Heckle, MD 03/18/2012, 7:46 AM

## 2012-03-19 LAB — GLUCOSE, CAPILLARY
Glucose-Capillary: 230 mg/dL — ABNORMAL HIGH (ref 70–99)
Glucose-Capillary: 159 mg/dL — ABNORMAL HIGH (ref 70–99)
Glucose-Capillary: 124 mg/dL — ABNORMAL HIGH (ref 70–99)

## 2012-03-19 NOTE — Progress Notes (Signed)
Subjective: Patient reports doing better, but still sore  Objective: Vital signs in last 24 hours: Temp:  [97.4 F (36.3 C)-100.7 F (38.2 C)] 97.9 F (36.6 C) (06/20 0609) Pulse Rate:  [67-86] 86  (06/20 0609) Resp:  [12-18] 16  (06/20 0609) BP: (102-134)/(47-68) 134/68 mmHg (06/20 0609) SpO2:  [95 %-100 %] 100 % (06/20 0609)  Intake/Output from previous day: 06/19 0701 - 06/20 0700 In: -  Out: 1580 [Urine:1425; Drains:155] Intake/Output this shift:    Physical Exam: Full strength, dressing CDI  Lab Results: No results found for this basename: WBC:2,HGB:2,HCT:2,PLT:2 in the last 72 hours BMET No results found for this basename: NA:2,K:2,CL:2,CO2:2,GLUCOSE:2,BUN:2,CREATININE:2,CALCIUM:2 in the last 72 hours  Studies/Results: Dg Lumbar Spine 2-3 Views  03/17/2012  *RADIOLOGY REPORT*  Clinical Data: L4-5 PLIF.  OPERATIVE LUMBAR SPINE - 2-3 VIEW  Comparison:  MRI lumbar spine 02/04/2012 [facility].  Findings: 2 spot images from the C-arm fluoroscopic device were submitted for interpretation post-operatively, demonstrating bilateral pedicle screws at L4 and L5 and interbody fusion plugs which are appropriately positioned in the L4-5 disc space.  IMPRESSION: L4-5 PLIF.  Original Report Authenticated By: Arnell Sieving, M.D.   Dg Lumbar Spine 1 View  03/17/2012  *RADIOLOGY REPORT*  Clinical Data: L4-5 posterior fusion.  LUMBAR SPINE - 1 VIEW  Comparison: 02/04/2012  Findings: Posterior surgical instruments are located posterior to the L4 and L5 vertebral bodies.  IMPRESSION: Intraoperative localization as above.  Original Report Authenticated By: Cyndie Chime, M.D.   Dg C-arm 1-60 Min  03/17/2012  *RADIOLOGY REPORT*  Clinical Data: L4-5 PLIF.  OPERATIVE LUMBAR SPINE - 2-3 VIEW  Comparison:  MRI lumbar spine 02/04/2012 [facility].  Findings: 2 spot images from the C-arm fluoroscopic device were submitted for interpretation post-operatively, demonstrating bilateral  pedicle screws at L4 and L5 and interbody fusion plugs which are appropriately positioned in the L4-5 disc space.  IMPRESSION: L4-5 PLIF.  Original Report Authenticated By: Arnell Sieving, M.D.    Assessment/Plan: Mobilizing with PT.  DC drain    LOS: 2 days    Niraj Kudrna D, MD 03/19/2012, 7:20 AM

## 2012-03-19 NOTE — Progress Notes (Signed)
Received report from [employer].  Pt sitting up in chair, pleasant and no needs or concerns voiced at this time.

## 2012-03-19 NOTE — Progress Notes (Signed)
Occupational Therapy Treatment Patient Details Name: Kristy [name] MRN: [id number] DOB: 1928-03-13 Today's Date: 03/19/2012 Time: 4098-1191 OT Time Calculation (min): 28 min  OT Assessment / Plan / Recommendation Comments on Treatment Session Pt progressing towards and increasing functional mobility.  However, pt requiring mod assist for sit <>stand transfers.  Also requires frequent verbal cueing to avoid twisting during ADL activity. Will continue AE education during next session.    Follow Up Recommendations  Home health OT    Barriers to Discharge       Equipment Recommendations  None recommended by OT    Recommendations for Other Services    Frequency Min 2X/week   Plan Discharge plan remains appropriate    Precautions / Restrictions Precautions Precautions: Back;Fall Precaution Booklet Issued: Yes (comment) Precaution Comments: Pt needed (A) to recall no arching and no twisting. Required Braces or Orthoses: Spinal Brace Spinal Brace: Lumbar corset;Applied in sitting position   Pertinent Vitals/Pain See vitals    ADL  Grooming: Performed;Wash/dry hands;Supervision/safety Where Assessed - Grooming: Unsupported standing Toilet Transfer: Performed;Moderate assistance Toilet Transfer Method:  (ambulating) Toilet Transfer Equipment: Raised toilet seat with arms (or 3-in-1 over toilet) Toileting - Clothing Manipulation and Hygiene: Performed;Minimal assistance Where Assessed - Toileting Clothing Manipulation and Hygiene: Standing Equipment Used: Back brace;Rolling walker;Gait belt Transfers/Ambulation Related to ADLs: Min assist with RW but requires frequent tactile and verbal cueing for correct use of RW and to adhere to back precautions. ADL Comments: Will benefit from further AE education.    OT Diagnosis:    OT Problem List:   OT Treatment Interventions:     OT Goals ADL Goals Pt Will Perform Grooming: with supervision;Standing at sink ADL Goal: Grooming -  Progress: Progressing toward goals Pt Will Transfer to Toilet: with supervision;with DME;3-in-1;Maintaining back safety precautions ADL Goal: Toilet Transfer - Progress: Progressing toward goals Miscellaneous OT Goals Miscellaneous OT Goal #1: Pt will independently verbalize 3/3 back precautions. OT Goal: Miscellaneous Goal #1 - Progress: Progressing toward goals  Visit Information  Last OT Received On: 03/19/12 Assistance Needed: +1    Subjective Data      Prior Functioning       Cognition  Overall Cognitive Status: Appears within functional limits for tasks assessed/performed Arousal/Alertness: Awake/alert Orientation Level: Appears intact for tasks assessed Behavior During Session: [facility] for tasks performed    Mobility Bed Mobility Sit to Sidelying Right: 4: Min assist Details for Bed Mobility Assistance: (A) with LE into bed and cues for proper technique to prevent twisting. Transfers Sit to Stand: 3: Mod assist;From chair/3-in-1 Stand to Sit: 4: Min assist;To bed Details for Transfer Assistance: (A) to initiate transfer from lower surface with max cues for hand placement.  (A) to slowly descend to recliner with cues for hand placement.   Exercises    Balance Balance Balance Assessed: Yes Static Sitting Balance Static Sitting - Balance Support: Right upper extremity supported;Left upper extremity supported Static Sitting - Level of Assistance: 5: Stand by assistance Static Standing Balance Static Standing - Balance Support: Right upper extremity supported;Left upper extremity supported Static Standing - Level of Assistance: 5: Stand by assistance  End of Session OT - End of Session Equipment Utilized During Treatment: Gait belt;Back brace Activity Tolerance: Patient limited by pain Patient left: in bed;with call bell/phone within reach;with bed alarm set Nurse Communication: Mobility status  03/19/2012 Cipriano Mile OTR/L Pager [phone] Office  [phone]  Cipriano Mile 03/19/2012, 10:28 AM

## 2012-03-19 NOTE — Progress Notes (Signed)
Physical Therapy Treatment Patient Details Name: Kristy [name] MRN: [id number] DOB: [birth year] Today's Date: 03/19/2012 Time: 4098-1191 PT Time Calculation (min): 29 min  PT Assessment / Plan / Recommendation Comments on Treatment Session  Pt able to increase ambulation distance and showing progress toward goals.  Will need to practice stairs next session and will need to reeducate on bed mobility prior to d/c home.    Follow Up Recommendations  Home health PT    Barriers to Discharge        Equipment Recommendations       Recommendations for Other Services    Frequency Min 5X/week   Plan Discharge plan remains appropriate;Frequency remains appropriate    Precautions / Restrictions Precautions Precautions: Back;Fall Precaution Booklet Issued: Yes (comment) Precaution Comments: Pt needed (A) to recall no arching and no twisting. Required Braces or Orthoses: Spinal Brace Spinal Brace: Lumbar corset;Applied in sitting position   Pertinent Vitals/Pain Did not rate but c/o back pain    Mobility  Bed Mobility Sit to Sidelying Right: 4: Min assist Details for Bed Mobility Assistance: (A) with LE into bed and cues for proper technique to prevent twisting. Transfers Transfers: Sit to Stand;Stand to Sit Sit to Stand: 3: Mod assist;From chair/3-in-1 Stand to Sit: 4: Min assist;To bed Details for Transfer Assistance: (A) to initiate transfer from lower surface with max cues for hand placement.  (A) to slowly descend to recliner with cues for hand placement. Ambulation/Gait Ambulation/Gait Assistance: 4: Min guard;4: Min Environmental consultant (Feet): 125 Feet Assistive device: Rolling walker Ambulation/Gait Assistance Details: Intermittent min (A) for RW management and occasional LOB x 2 Gait Pattern: Decreased stride length;Shuffle;Decreased trunk rotation    Exercises     PT Diagnosis:    PT Problem List:   PT Treatment Interventions:     PT Goals Acute Rehab PT  Goals PT Goal Formulation: With patient/family Time For Goal Achievement: 03/25/12 Potential to Achieve Goals: Good Pt will Roll Supine to Right Side: with modified independence PT Goal: Rolling Supine to Right Side - Progress: Progressing toward goal Pt will go Sit to Stand: with modified independence PT Goal: Sit to Stand - Progress: Progressing toward goal Pt will go Stand to Sit: with modified independence PT Goal: Stand to Sit - Progress: Progressing toward goal Pt will Ambulate: >150 feet;with modified independence;with rolling walker PT Goal: Ambulate - Progress: Progressing toward goal Additional Goals Additional Goal #1: Pt will be able to recall and adhere to 3/3 back precautions. PT Goal: Additional Goal #1 - Progress: Progressing toward goal  Visit Information  Last PT Received On: 03/19/12 Assistance Needed: +1    Subjective Data  Subjective: "I feel like the tape is pulling my skin."   Cognition  Overall Cognitive Status: Appears within functional limits for tasks assessed/performed Arousal/Alertness: Awake/alert Orientation Level: Appears intact for tasks assessed Behavior During Session: [facility] for tasks performed    Balance  Balance Balance Assessed: Yes Static Sitting Balance Static Sitting - Balance Support: Right upper extremity supported;Left upper extremity supported Static Sitting - Level of Assistance: 5: Stand by assistance Static Standing Balance Static Standing - Balance Support: Right upper extremity supported;Left upper extremity supported Static Standing - Level of Assistance: 5: Stand by assistance  End of Session PT - End of Session Equipment Utilized During Treatment: Gait belt;Back brace Activity Tolerance: Patient tolerated treatment well Patient left: in bed;with call bell/phone within reach Nurse Communication: Mobility status    Jonae Renshaw 03/19/2012, 10:12 AM [name], PT DPT  [phone]    

## 2012-03-20 LAB — GLUCOSE, CAPILLARY: Glucose-Capillary: 135 mg/dL — ABNORMAL HIGH (ref 70–99)

## 2012-03-20 NOTE — Progress Notes (Signed)
Physical Therapy Treatment Patient Details Name: Kristy [name] MRN: [id number] DOB: [birth year] Today's Date: 03/20/2012 Time: 4098-1191 PT Time Calculation (min): 16 min  PT Assessment / Plan / Recommendation Comments on Treatment Session  Patient able to practice steps today. Pt did say that she has an entrance without step and will plan on doing that entrance. Patient has support from family. Increase pain this session. Declined bed mobility but edcauted on precautions and no further questions from her or family.     Follow Up Recommendations  Home health PT    Barriers to Discharge        Equipment Recommendations  None recommended by PT    Recommendations for Other Services    Frequency Min 5X/week   Plan Discharge plan remains appropriate    Precautions / Restrictions Precautions Precautions: Back Precaution Comments: Patient able to recall 2/3 back precautions. Cues for no twisting.  Spinal Brace: Lumbar corset;Applied in sitting position Restrictions Weight Bearing Restrictions: No   Pertinent Vitals/Pain 10/10 back pain. RN notified    Mobility  Bed Mobility Bed Mobility: Not assessed Transfers Sit to Stand: 3: Mod assist;With upper extremity assist;From chair/3-in-1;With armrests Stand to Sit: 4: Min guard;To chair/3-in-1;With armrests;With upper extremity assist Details for Transfer Assistance: Cues for safest technique and hand placement. Cues to scoot to edge of chair prior to standing.  Ambulation/Gait Ambulation/Gait Assistance: 4: Min guard Ambulation Distance (Feet): 240 Feet Assistive device: Rolling walker Ambulation/Gait Assistance Details: Cues for posture Gait Pattern: Step-through pattern;Decreased stride length Stairs: Yes Stairs Assistance: 4: Min assist Stair Management Technique: Step to pattern;Forwards Number of Stairs: 2     Exercises     PT Diagnosis:    PT Problem List:   PT Treatment Interventions:     PT Goals Acute Rehab PT  Goals PT Goal: Supine/Side to Sit - Progress: Progressing toward goal PT Goal: Sit to Stand - Progress: Progressing toward goal PT Goal: Stand to Sit - Progress: Progressing toward goal PT Goal: Ambulate - Progress: Progressing toward goal Pt will Go Up / Down Stairs: 1-2 stairs;with min assist;with least restrictive assistive device PT Goal: Up/Down Stairs - Progress: Goal set today Additional Goals PT Goal: Additional Goal #1 - Progress: Progressing toward goal  Visit Information  Last PT Received On: 03/20/12 Assistance Needed: +1    Subjective Data      Cognition  Overall Cognitive Status: Appears within functional limits for tasks assessed/performed Arousal/Alertness: Awake/alert Orientation Level: Appears intact for tasks assessed Behavior During Session: [facility] for tasks performed    Balance     End of Session PT - End of Session Equipment Utilized During Treatment: Gait belt;Back brace Activity Tolerance: Patient tolerated treatment well Patient left: in chair Nurse Communication: Mobility status    Fredrich Birks 03/20/2012, 10:04 AM 03/20/2012 Fredrich Birks PTA [phone] pager [phone] office

## 2012-03-20 NOTE — Progress Notes (Signed)
Subjective: Patient reports "I think I'm doing good."  Objective: Vital signs in last 24 hours: Temp:  [98.1 F (36.7 C)-100 F (37.8 C)] 98.4 F (36.9 C) (06/21 0510) Pulse Rate:  [85-96] 90  (06/21 0510) Resp:  [16-18] 16  (06/21 0510) BP: (92-155)/(57-72) 102/72 mmHg (06/21 0510) SpO2:  [94 %-100 %] 97 % (06/21 0510)  Intake/Output from previous day: 06/20 0701 - 06/21 0700 In: 1080 [P.O.:1080] Out: -  Intake/Output this shift:    Alert, conversant. Pain well controlled with periodic Vicodin & Valium. Good strength BLE. Incision without erythema, swelling, drainage; drsg dry.  Lab Results: No results found for this basename: WBC:2,HGB:2,HCT:2,PLT:2 in the last 72 hours BMET No results found for this basename: NA:2,K:2,CL:2,CO2:2,GLUCOSE:2,BUN:2,CREATININE:2,CALCIUM:2 in the last 72 hours  Studies/Results: No results found.  Assessment/Plan: Improving  LOS: 3 days  Per Dr. Venetia Maxon, d/c iv, d/c to home. Pt will call office to schedule 3-4wk f/u in office with Dr. Venetia Maxon. Rx's given to pt: Hydrocodone 5/325 1-2 po q6hrs prn pain #60; Valium 5mg  1 po q8hrs prn spasm #50. Pt & husband verbalize understanding of d/c instructions.   Georgiann Cocker 03/20/2012, 8:29 AM

## 2012-03-20 NOTE — Progress Notes (Signed)
Added stair goal  03/20/2012 Milana Kidney DPT PAGER: [phone] OFFICE: [phone]

## 2012-03-20 NOTE — Progress Notes (Signed)
Agree with above 

## 2012-03-20 NOTE — Progress Notes (Signed)
Pt discharged home, with home health Pt/OT, all ready has all of required equipment.  Instructions given by PA, when discharged, Rx.given.

## 2012-03-20 NOTE — Discharge Summary (Signed)
Physician Discharge Summary  Patient ID: Kristy [name] MRN: [id number] DOB/AGE: 76-Apr-1929 76 y.o.  Admit date: 03/17/2012 Discharge date: 03/20/2012  Admission Diagnoses: Spondylolisthesis, Lumbar stenosis, Spondylosis, radiculopathy L 4 / 5   Discharge Diagnoses: Spondylolisthesis, Lumbar stenosis, Spondylosis, radiculopathy L 4 / 5 s/p POSTERIOR LUMBAR Decompression and FUSION PLIF with Pedicle screws and posterolateral arthrodesis L 4 / 5  Active Problems:  * No active hospital problems. *    Discharged Condition: good  Hospital Course: Kristy [name] was admitted [phone] for PLIF L4-5 for dx spondylolisthesis, spondylosis, and radiculopathy. Following an uncomplicated surgery, pt recovered well in Neuro PACU and transferred to 3000 for nursing & therapy care.   Consults: None  Significant Diagnostic Studies: radiology: X-Ray: intra-operative  Treatments: surgery: POSTERIOR LUMBAR Decompression and FUSION PLIF with Pedicle screws and posterolateral arthrodesis L 4 / 5   Discharge Exam: Blood pressure 102/72, pulse 90, temperature 98.4 F (36.9 C), temperature source Oral, resp. rate 16, height 5\' 3"  (1.6 m), weight 78 kg (171 lb 15.3 oz), SpO2 97.00%. Alert, conversant. Pain well controlled with periodic Vicodin & Valium. Good strength BLE. Incision without erythema, swelling, drainage; drsg dry.    Disposition: D/C to home; HHPT/OT; RW, 3-in-one; Pt will call office to schedule 3-4wk f/u in office with Dr. Venetia Maxon. Rx's given to pt: Hydrocodone 5/325 1-2 po q6hrs prn pain #60; Valium 5mg  1 po q8hrs prn spasm #50. Pt & husband verbalize understanding of d/c instructions   Discharge Orders    Future Appointments: Provider: Department: Dept Phone: Center:   06/17/2012 9:00 AM Radene Gunning [facility] [phone] None   06/17/2012 9:30 AM Si Gaul, MD [facility] [phone] None     Medication List  As of 03/20/2012  8:33 AM   ASK your doctor about these  medications         ALPRAZolam 0.25 MG tablet   Commonly known as: XANAX   Take 0.25 mg by mouth as needed. For anxiety      aspirin 81 MG tablet   Take 81 mg by mouth daily.      BLACK COHOSH PO   Take 1 tablet by mouth daily.      esomeprazole 40 MG capsule   Commonly known as: NEXIUM   Take 40 mg by mouth daily as needed. For acid reflux      estradiol 1 MG tablet   Commonly known as: ESTRACE   Take 1 mg by mouth 2 (two) times daily.      ezetimibe 10 MG tablet   Commonly known as: ZETIA   Take 5 mg by mouth daily.      fenofibrate micronized 134 MG capsule   Commonly known as: LOFIBRA   Take 134 mg by mouth daily before breakfast.      fish oil-omega-3 fatty acids 1000 MG capsule   Take 1 g by mouth daily.      furosemide 40 MG tablet   Commonly known as: LASIX   Take 40 mg by mouth 2 (two) times a week.      glyBURIDE-metformin 5-500 MG per tablet   Commonly known as: GLUCOVANCE   Take 1 tablet by mouth 2 (two) times daily with a meal.      ibuprofen 200 MG tablet   Commonly known as: ADVIL,MOTRIN   Take 400 mg by mouth 3 (three) times daily as needed.      metoprolol succinate 25 MG 24 hr tablet   Commonly known as: TOPROL-XL  Take 25 mg by mouth daily.      pravastatin 80 MG tablet   Commonly known as: PRAVACHOL   Take 80 mg by mouth daily.      VITAMIN D (CHOLECALCIFEROL) PO   Take 1 tablet by mouth daily.             Signed: Georgiann Cocker 03/20/2012, 8:33 AM

## 2012-03-24 NOTE — Discharge Summary (Signed)
Agree with above 

## 2012-06-17 ENCOUNTER — Other Ambulatory Visit (HOSPITAL_BASED_OUTPATIENT_CLINIC_OR_DEPARTMENT_OTHER): Payer: Medicare Other | Admitting: Lab

## 2012-06-17 ENCOUNTER — Ambulatory Visit (HOSPITAL_BASED_OUTPATIENT_CLINIC_OR_DEPARTMENT_OTHER): Payer: Medicare Other | Admitting: Internal Medicine

## 2012-06-17 ENCOUNTER — Telehealth: Payer: Self-pay | Admitting: Internal Medicine

## 2012-06-17 ENCOUNTER — Other Ambulatory Visit (HOSPITAL_COMMUNITY)
Admission: RE | Admit: 2012-06-17 | Discharge: 2012-06-17 | Disposition: A | Discharge status: Home / Self Care | Payer: Medicare Other | Source: Ambulatory Visit | Attending: Internal Medicine | Admitting: Internal Medicine

## 2012-06-17 VITALS — BP 135/65 | HR 82 | Temp 97.3°F | Resp 20 | Ht 63.0 in | Wt 174.0 lb

## 2012-06-17 DIAGNOSIS — C9110 Chronic lymphocytic leukemia of B-cell type not having achieved remission: Secondary | ICD-10-CM

## 2012-06-17 LAB — CBC WITH DIFFERENTIAL/PLATELET
Basophils Absolute: 0 10*3/uL (ref 0.0–0.1)
EOS%: 0.7 % (ref 0.0–7.0)
HCT: 36.5 % (ref 34.8–46.6)
HGB: 12.1 g/dL (ref 11.6–15.9)
LYMPH%: 45.6 % (ref 14.0–49.7)
MCH: 28.8 pg (ref 25.1–34.0)
MCV: 86.8 fL (ref 79.5–101.0)
NEUT%: 46.4 % (ref 38.4–76.8)
Platelets: 161 10*3/uL (ref 145–400)
lymph#: 5.5 10*3/uL — ABNORMAL HIGH (ref 0.9–3.3)

## 2012-06-17 LAB — COMPREHENSIVE METABOLIC PANEL (CC13)
AST: 15 U/L (ref 5–34)
BUN: 14 mg/dL (ref 7.0–26.0)
CO2: 24 mEq/L (ref 22–29)
Calcium: 9.6 mg/dL (ref 8.4–10.4)
Chloride: 105 mEq/L (ref 98–107)
Creatinine: 1 mg/dL (ref 0.6–1.1)

## 2012-06-17 LAB — LACTATE DEHYDROGENASE (CC13): LDH: 136 U/L (ref 125–220)

## 2012-06-17 NOTE — Telephone Encounter (Signed)
appt made and printed for pt aom °

## 2012-06-17 NOTE — Patient Instructions (Signed)
Your white blood count is stable today. Followup in 6 months.

## 2012-06-17 NOTE — Progress Notes (Signed)
[facility] [phone]   [phone]  OFFICE PROGRESS NOTE  Rudi Heap, MD [address] [place] [address]  DIAGNOSIS: Stage 0 chronic lymphocytic leukemia  PRIOR THERAPY: None  CURRENT THERAPY: Observation.  INTERVAL HISTORY: Kristy [name] 78 y.o. female returns to the clinic today for routine six-month followup visit accompanied by her husband. The patient is feeling fine today with no specific complaints. She had that back surgery a few months ago and she is recovering well from the surgery. The patient denied having any significant weight loss or night sweats. She has no significant chest pain, shortness breath, cough or hemoptysis. She has repeat CBC and comprehensive metabolic panel performed earlier today and she is here for evaluation and discussion of her lab results.  MEDICAL HISTORY: Past Medical History  Diagnosis Date  . Diabetes mellitus   . Hypercholesteremia   . Colon polyps   . Lumbar spondylosis   . Diverticulitis   . Anxiety   . H/O hiatal hernia   . Hypertension     don moore  pcp         ALLERGIES:  is allergic to lipitor and vioxx.  MEDICATIONS:  Current Outpatient Prescriptions  Medication Sig Dispense Refill  . ALPRAZolam (XANAX) 0.25 MG tablet Take 0.25 mg by mouth as needed. For anxiety      . aspirin 81 MG tablet Take 81 mg by mouth daily.      Marland Kitchen BLACK COHOSH PO Take 1 tablet by mouth daily.      Marland Kitchen esomeprazole (NEXIUM) 40 MG capsule Take 40 mg by mouth daily as needed. For acid reflux      . estradiol (ESTRACE) 1 MG tablet Take 1 mg by mouth 2 (two) times daily.       Marland Kitchen ezetimibe (ZETIA) 10 MG tablet Take 5 mg by mouth daily.       . fenofibrate micronized (LOFIBRA) 134 MG capsule Take 134 mg by mouth daily before breakfast.      . fish oil-omega-3 fatty acids 1000 MG capsule Take 1 g by mouth daily.       . furosemide (LASIX) 40 MG tablet Take 40 mg by mouth 2 (two) times a week.       .  glyBURIDE-metformin (GLUCOVANCE) 5-500 MG per tablet Take 1 tablet by mouth 2 (two) times daily with a meal.      . ibuprofen (ADVIL,MOTRIN) 200 MG tablet Take 400 mg by mouth 3 (three) times daily as needed.      . metoprolol succinate (TOPROL-XL) 25 MG 24 hr tablet Take 25 mg by mouth daily.      . pravastatin (PRAVACHOL) 80 MG tablet Take 80 mg by mouth daily.      Marland Kitchen VITAMIN D, CHOLECALCIFEROL, PO Take 1 tablet by mouth daily.         SURGICAL HISTORY:  Past Surgical History  Procedure Date  . Cholecystectomy   . Abdominal hysterectomy     REVIEW OF SYSTEMS:  A comprehensive review of systems was negative.   PHYSICAL EXAMINATION: General appearance: alert, cooperative and no distress Head: Normocephalic, without obvious abnormality, atraumatic Neck: no adenopathy Lymph nodes: Cervical, supraclavicular, and axillary nodes normal. Resp: clear to auscultation bilaterally Cardio: regular rate and rhythm, S1, S2 normal, no murmur, click, rub or gallop GI: soft, non-tender; bowel sounds normal; no masses,  no organomegaly Extremities: extremities normal, atraumatic, no cyanosis or edema  ECOG PERFORMANCE STATUS: 1 -  Symptomatic but completely ambulatory  Blood pressure 135/65, pulse 82, temperature 97.3 F (36.3 C), resp. rate 20, height 5\' 3"  (1.6 m), weight 174 lb (78.926 kg).  LABORATORY DATA: Lab Results  Component Value Date   WBC 11.9* 06/17/2012   HGB 12.1 06/17/2012   HCT 36.5 06/17/2012   MCV 86.8 06/17/2012   PLT 161 06/17/2012      Chemistry      Component Value Date/Time   NA 139 06/17/2012 0845   NA 140 03/10/2012 0855   K 5.0 06/17/2012 0845   K 3.9 03/10/2012 0855   CL 105 06/17/2012 0845   CL 102 03/10/2012 0855   CO2 24 06/17/2012 0845   CO2 27 03/10/2012 0855   BUN 14.0 06/17/2012 0845   BUN 22 03/10/2012 0855   CREATININE 1.0 06/17/2012 0845   CREATININE 0.98 03/10/2012 0855      Component Value Date/Time   CALCIUM 9.6 06/17/2012 0845   CALCIUM 10.1 03/10/2012  0855   ALKPHOS 45 06/17/2012 0845   ALKPHOS 35* 12/16/2011 0906   AST 15 06/17/2012 0845   AST 13 12/16/2011 0906   ALT 14 06/17/2012 0845   ALT 12 12/16/2011 0906   BILITOT 0.60 06/17/2012 0845   BILITOT 0.5 12/16/2011 0906       RADIOGRAPHIC STUDIES: No results found.  ASSESSMENT: This is a very pleasant 78 years old white female with stage 0 chronic lymphocytic leukemia currently on observation.  PLAN: The patient is doing fine today with no significant increase in her total white blood count. I discussed the lab result with the patient and her husband and recommend for her to continue on observation for now. I would see her back for followup visit in 6 months with repeat CBC, comprehensive metabolic panel and LDH. The patient was advised to call me immediately if she has any concerning symptoms in the interval.  All questions were answered. The patient knows to call the clinic with any problems, questions or concerns. We can certainly see the patient much sooner if necessary.

## 2012-06-19 LAB — FLOW CYTOMETRY

## 2012-08-12 ENCOUNTER — Ambulatory Visit: Payer: Medicare Other | Attending: Neurosurgery | Admitting: Physical Therapy

## 2012-08-12 DIAGNOSIS — R5381 Other malaise: Secondary | ICD-10-CM | POA: Insufficient documentation

## 2012-08-12 DIAGNOSIS — M6281 Muscle weakness (generalized): Secondary | ICD-10-CM | POA: Insufficient documentation

## 2012-08-12 DIAGNOSIS — IMO0001 Reserved for inherently not codable concepts without codable children: Secondary | ICD-10-CM | POA: Insufficient documentation

## 2012-08-17 ENCOUNTER — Ambulatory Visit: Payer: Medicare Other | Admitting: *Deleted

## 2012-08-19 ENCOUNTER — Encounter: Payer: Medicare Other | Admitting: *Deleted

## 2012-08-26 ENCOUNTER — Ambulatory Visit: Payer: Medicare Other | Admitting: Physical Therapy

## 2012-11-04 ENCOUNTER — Ambulatory Visit (INDEPENDENT_AMBULATORY_CARE_PROVIDER_SITE_OTHER): Payer: Medicare Other | Admitting: Cardiology

## 2012-11-04 ENCOUNTER — Encounter: Payer: Self-pay | Admitting: Cardiology

## 2012-11-04 VITALS — BP 130/70 | HR 75 | Ht 65.0 in | Wt 170.0 lb

## 2012-11-04 DIAGNOSIS — Z01810 Encounter for preprocedural cardiovascular examination: Secondary | ICD-10-CM | POA: Insufficient documentation

## 2012-11-04 NOTE — Progress Notes (Signed)
HPI The patient presents for preoperative prior to right knee replacement.  I have saw her several years ago. She had some chest discomfort. In 2007 she had a negative stress perfusion study. I he also has a history of palpitations with supraventricular tachycardia.  This later in the she tries to stay active. She does some light housekeeping (greater than 4 METS).  With this he denies any symptoms. The patient denies any new symptoms such as chest discomfort, neck or arm discomfort. There has been no new shortness of breath, PND or orthopnea. There have been no reported palpitations, presyncope or syncope.  In particular the patient has had none of the symptoms consistent with her supraventricular tachycardia. He did have back surgery recently and tolerated this without any cardiovascular complications.  Allergies  Allergen Reactions  . Lipitor (Atorvastatin Calcium)   . Vioxx (Rofecoxib)     Current Outpatient Prescriptions  Medication Sig Dispense Refill  . ALPRAZolam (XANAX) 0.25 MG tablet Take 0.25 mg by mouth as needed. For anxiety      . aspirin 81 MG tablet Take 81 mg by mouth daily.      Marland Kitchen BLACK COHOSH PO Take 1 tablet by mouth daily.      Marland Kitchen estradiol (ESTRACE) 1 MG tablet Take 1 mg by mouth 2 (two) times daily.       Marland Kitchen ezetimibe (ZETIA) 10 MG tablet Take 5 mg by mouth daily.       . fenofibrate micronized (LOFIBRA) 134 MG capsule Take 134 mg by mouth daily before breakfast.      . fish oil-omega-3 fatty acids 1000 MG capsule Take 1 g by mouth daily.       . furosemide (LASIX) 40 MG tablet Take 40 mg by mouth 2 (two) times a week.       . glyBURIDE-metformin (GLUCOVANCE) 5-500 MG per tablet Take 1 tablet by mouth 2 (two) times daily with a meal.      . ibuprofen (ADVIL,MOTRIN) 200 MG tablet Take 400 mg by mouth 3 (three) times daily as needed.      . metoprolol succinate (TOPROL-XL) 25 MG 24 hr tablet Take 25 mg by mouth daily.      . pravastatin (PRAVACHOL) 80 MG tablet Take 80 mg  by mouth daily.      Marland Kitchen VITAMIN D, CHOLECALCIFEROL, PO Take 1 tablet by mouth daily.         Past Medical History  Diagnosis Date  . Diabetes mellitus   . Hypercholesteremia   . Colon polyps   . Lumbar spondylosis   . Diverticulitis   . Anxiety   . H/O hiatal hernia   . Hypertension     Dr. Christell Constant    Past Surgical History  Procedure Date  . Cholecystectomy   . Abdominal hysterectomy   . Cataract extraction   . Lumbar back surgery   . Ventral hernia repair     Family History  Problem Relation Age of Onset  . Stroke Father 81    History   Social History  . Marital Status: Married    Spouse Name: N/A    Number of Children: 2  . Years of Education: N/A   Occupational History  . Not on file.   Social History Main Topics  . Smoking status: Never Smoker   . Smokeless tobacco: Not on file  . Alcohol Use: No  . Drug Use: No  . Sexually Active:    Other Topics Concern  . Not  on file   Social History Narrative   Lives with husband    ROS:  Positive for difficulty hearing, reflux, leg swelling and cramping.  Otherwise as stated in the HPI and negative for all other systems.  PHYSICAL EXAM BP 130/70  Pulse 75  Ht 5\' 5"  (1.651 m)  Wt 170 lb (77.111 kg)  BMI 28.29 kg/m2 GENERAL:  Well appearing HEENT:  Pupils equal round and reactive, fundi not visualized, oral mucosa unremarkable NECK:  No jugular venous distention, waveform within normal limits, carotid upstroke brisk and symmetric, no bruits, no thyromegaly LYMPHATICS:  No cervical, inguinal adenopathy LUNGS:  Clear to auscultation bilaterally BACK:  No CVA tenderness CHEST:  Unremarkable HEART:  PMI not displaced or sustained,S1 and S2 within normal limits, no S3, no S4, no clicks, no rubs, no murmurs ABD:  Flat, positive bowel sounds normal in frequency in pitch, no bruits, no rebound, no guarding, no midline pulsatile mass, no hepatomegaly, no splenomegaly EXT:  2 plus pulses throughout, no edema, no  cyanosis no clubbing SKIN:  No rashes no nodules NEURO:  Cranial nerves II through XII grossly intact, motor grossly intact throughout PSYCH:  Cognitively intact, oriented to person place and time   EKG:  Sinus rhythm, rate 76, leftward axis, no acute ST-T wave. 11/04/2012  ASSESSMENT AND PLAN  Preop Exam - The patient has no high-risk physical findings orbits were cool features. Therefore, based on ACC/AHA guidelines, the patient would be at acceptable risk for the planned procedure without further cardiovascular testing.  Hypertension - The blood pressure is at target. No change in medications is indicated. We will continue with therapeutic lifestyle changes (TLC).  Hyperlipidemia - She has an excellent lipid profile. No change in therapy is suggested. This is followed expertly by Dr. Christell Constant.  SVT - The patient has had no symptomatic tachypalpitations. She will continue on the beta blocker.

## 2012-11-04 NOTE — Patient Instructions (Addendum)
The current medical regimen is effective;  continue present plan and medications.  You have been cleared for surgery.  Follow up as needed. 

## 2012-12-08 ENCOUNTER — Telehealth: Payer: Self-pay | Admitting: Medical Oncology

## 2012-12-08 ENCOUNTER — Telehealth: Payer: Self-pay | Admitting: Internal Medicine

## 2012-12-08 NOTE — Telephone Encounter (Signed)
Kristy [name] reported she received a voice mail from pt that her PCP said she does not need to f/u with Dr Arbutus Ped anymore . I spoke to pt and she said Dr Christell Constant said her blood was good and she does not need to see Dr Arbutus Ped for now.  I will notify Dr Arbutus Ped.

## 2012-12-15 ENCOUNTER — Other Ambulatory Visit: Payer: Self-pay | Admitting: *Deleted

## 2012-12-15 ENCOUNTER — Ambulatory Visit: Payer: Medicare Other | Admitting: Internal Medicine

## 2012-12-15 ENCOUNTER — Telehealth: Payer: Self-pay | Admitting: General Practice

## 2012-12-15 ENCOUNTER — Telehealth: Payer: Self-pay | Admitting: *Deleted

## 2012-12-15 ENCOUNTER — Other Ambulatory Visit: Payer: Medicare Other | Admitting: Lab

## 2012-12-15 MED ORDER — METOPROLOL SUCCINATE ER 25 MG PO TB24: 25.0000 mg | ORAL_TABLET | Freq: Every day | ORAL | Status: DC

## 2012-12-15 NOTE — Telephone Encounter (Signed)
SAW MAE MARTIN YETERSDAY STILL IN PAIN FROM SHINGLES PLEASE CALL

## 2012-12-15 NOTE — Telephone Encounter (Signed)
discussed with patient frequency of pain medication. Allowed patient to take one tablet every 4 hours for pain.

## 2012-12-15 NOTE — Telephone Encounter (Signed)
Pain meds are not touching pain. Supposed to take every 8 hours but wondering if she can take every 4 hours instead for a couple days

## 2012-12-17 ENCOUNTER — Telehealth: Payer: Self-pay | Admitting: General Practice

## 2012-12-17 NOTE — Telephone Encounter (Signed)
Patient called complaining that the pain and soreness is "driving her crazy" and would like to know what she can do

## 2012-12-17 NOTE — Telephone Encounter (Signed)
Please advise 

## 2012-12-18 ENCOUNTER — Other Ambulatory Visit: Payer: Self-pay | Admitting: General Practice

## 2012-12-18 ENCOUNTER — Telehealth: Payer: Self-pay | Admitting: *Deleted

## 2012-12-18 DIAGNOSIS — B0229 Other postherpetic nervous system involvement: Secondary | ICD-10-CM

## 2012-12-18 MED ORDER — GABAPENTIN 300 MG PO CAPS: 300.0000 mg | ORAL_CAPSULE | Freq: Three times a day (TID) | ORAL | Status: DC

## 2012-12-18 NOTE — Telephone Encounter (Signed)
Advised patient that Gabapentin sent to [facility] for  Nerve pain

## 2012-12-19 ENCOUNTER — Telehealth: Payer: Self-pay | Admitting: General Practice

## 2012-12-19 NOTE — Telephone Encounter (Signed)
SHE NEEDS TO KNOW IF IT IS OK TO TAKE HYDROCODONE

## 2012-12-21 ENCOUNTER — Telehealth: Payer: Self-pay | Admitting: General Practice

## 2012-12-21 ENCOUNTER — Other Ambulatory Visit: Payer: Self-pay | Admitting: General Practice

## 2012-12-21 NOTE — Telephone Encounter (Signed)
Spoke with patient. Will have Mae's nurse call patient.  Has questions about shingles and wants pain medicines called to [facility].

## 2012-12-21 NOTE — Telephone Encounter (Signed)
Has shingles. Wants repeat visit with Ruthell Rummage.

## 2012-12-22 ENCOUNTER — Other Ambulatory Visit: Payer: Self-pay | Admitting: Family Medicine

## 2012-12-22 ENCOUNTER — Telehealth: Payer: Self-pay | Admitting: General Practice

## 2012-12-22 MED ORDER — HYDROCODONE-ACETAMINOPHEN 5-325 MG PO TABS
1.0000 | ORAL_TABLET | Freq: Four times a day (QID) | ORAL | Status: DC | PRN
Start: 2012-12-22 — End: 2012-12-22

## 2012-12-22 MED ORDER — HYDROCODONE-ACETAMINOPHEN 5-325 MG PO TABS: 1.0000 | ORAL_TABLET | Freq: Four times a day (QID) | ORAL | Status: DC | PRN

## 2012-12-22 NOTE — Telephone Encounter (Signed)
Spoke with patient and discussed shingle pain. Discussed gabapentin that was ordered. Patient verbalized her pain had greatly improved and verbalized understanding of the above discussed.

## 2012-12-22 NOTE — Telephone Encounter (Signed)
Please advise 

## 2012-12-22 NOTE — Telephone Encounter (Signed)
Returned Kristy [name]'s call and was on hold for a bit. I advised that she wouold receive a call when they were not busy

## 2012-12-22 NOTE — Telephone Encounter (Signed)
Message left for patient to return call.

## 2012-12-23 ENCOUNTER — Other Ambulatory Visit: Payer: Self-pay | Admitting: General Practice

## 2012-12-24 ENCOUNTER — Telehealth: Payer: Self-pay | Admitting: General Practice

## 2012-12-24 DIAGNOSIS — M792 Neuralgia and neuritis, unspecified: Secondary | ICD-10-CM

## 2012-12-24 MED ORDER — HYDROCODONE-ACETAMINOPHEN 5-325 MG PO TABS: 1.0000 | ORAL_TABLET | Freq: Four times a day (QID) | ORAL | Status: DC | PRN

## 2012-12-24 NOTE — Telephone Encounter (Signed)
Discussed with patient nerve pain from shingles. Instructed to get OTC capsaicin cream (only apply to intact skin). Patient verbalized understanding.

## 2012-12-28 ENCOUNTER — Telehealth: Payer: Self-pay | Admitting: General Practice

## 2012-12-28 NOTE — Telephone Encounter (Signed)
Mae, please advise

## 2012-12-28 NOTE — Telephone Encounter (Signed)
Pain meds called in 3/27 to take QID should still have some left. Actually Ruthell Rummage was the one that gave her pain meds. Not me. Check with ae about refill.

## 2013-01-01 ENCOUNTER — Other Ambulatory Visit: Payer: Self-pay | Admitting: Orthopedic Surgery

## 2013-01-01 ENCOUNTER — Encounter (HOSPITAL_COMMUNITY): Payer: Self-pay | Admitting: Pharmacy Technician

## 2013-01-01 MED ORDER — BUPIVACAINE LIPOSOME 1.3 % IJ SUSP: 20.0000 mL | Freq: Once | INTRAMUSCULAR | Status: DC

## 2013-01-01 MED ORDER — DEXAMETHASONE SODIUM PHOSPHATE 10 MG/ML IJ SOLN: 10.0000 mg | Freq: Once | INTRAMUSCULAR | Status: DC

## 2013-01-01 NOTE — Progress Notes (Signed)
Preoperative surgical orders have been place into the Epic hospital system for Kristy [name] on 01/01/2013, 3:47 PM  by Patrica Duel for surgery on 01/18/2013.  Preop Total Knee orders including Experal, IV Tylenol, and IV Decadron as long as there are no contraindications to the above medications. Avel Peace, PA-C

## 2013-01-02 ENCOUNTER — Ambulatory Visit (INDEPENDENT_AMBULATORY_CARE_PROVIDER_SITE_OTHER): Payer: Medicare Other | Admitting: Family Medicine

## 2013-01-02 ENCOUNTER — Encounter: Payer: Self-pay | Admitting: Family Medicine

## 2013-01-02 ENCOUNTER — Telehealth: Payer: Self-pay | Admitting: *Deleted

## 2013-01-02 VITALS — BP 146/72 | HR 60 | Temp 98.2°F | Ht 63.0 in | Wt 169.0 lb

## 2013-01-02 DIAGNOSIS — B029 Zoster without complications: Secondary | ICD-10-CM

## 2013-01-02 DIAGNOSIS — M1710 Unilateral primary osteoarthritis, unspecified knee: Secondary | ICD-10-CM

## 2013-01-02 DIAGNOSIS — B0229 Other postherpetic nervous system involvement: Secondary | ICD-10-CM

## 2013-01-02 DIAGNOSIS — M1712 Unilateral primary osteoarthritis, left knee: Secondary | ICD-10-CM

## 2013-01-02 MED ORDER — GABAPENTIN 300 MG PO CAPS: 300.0000 mg | ORAL_CAPSULE | Freq: Four times a day (QID) | ORAL | Status: DC

## 2013-01-02 MED ORDER — GABAPENTIN 100 MG PO CAPS: 100.0000 mg | ORAL_CAPSULE | Freq: Three times a day (TID) | ORAL | Status: DC

## 2013-01-02 MED ORDER — GABAPENTIN 300 MG PO CAPS: 300.0000 mg | ORAL_CAPSULE | Freq: Three times a day (TID) | ORAL | Status: DC

## 2013-01-02 NOTE — Progress Notes (Signed)
  Subjective:    Patient ID: BARABARA MOTZ, female    DOB: 25-Aug-1928, 79 y.o.   MRN: [id number]  HPI Dayonna comes in today having severe right-sided pain into her right breast. This pain is in the areas of the former shingles rash. She completed a course of Valtrex March 24 or 25th.   Review of Systems  Constitutional: Positive for activity change (pain in right shoulder). Negative for fever.  Respiratory: Negative for cough.   Cardiovascular: Negative for chest pain.  Musculoskeletal: Positive for back pain (right side of back).  Skin: Positive for rash (shingles).       Objective:   Physical Exam On examining Tyna she did appear to be in some pain with movement of the right arm and having sensitivity to the right breast. The skin revealed a drying shingles rash. Lungs were clear anteriorly and posteriorly. Heart had a regular rate and rhythm at 72 per min. Both breasts were checked and there was no abnormality palpated. The last chest x-ray that was done in our office was January of 2013 and it was normal. She had a recent chest x-ray by Dr. Despina Hick in preparation for surgery for her knee. She said this x-ray was normal also.      Assessment & Plan:  Postherpetic neuralgia   Plan Gabapentin 100 mg 4 times daily Recheck in 4 weeks

## 2013-01-02 NOTE — Telephone Encounter (Signed)
Error in ordering ,meds- aricept was for her husband- will be fixed in epic

## 2013-01-02 NOTE — Patient Instructions (Signed)
Take gabapentin as directed May make you'll bit sleepy so be aware of this Call for progress in 5-7 day

## 2013-01-06 NOTE — Patient Instructions (Addendum)
20 Kristy [name]  01/06/2013   Your procedure is scheduled on: 4-21  -2014  Report to [facility] at     [phone]   AM.  Call this number if you have problems the morning of surgery: [phone]  Or Presurgical Testing [phone](Kristy [name])       Do not eat food:After Midnight.    Take these medicines the morning of surgery with A SIP OF WATER: Metoprolol. Gabapentin. Zetia. Estrace. Pravachol. Bring and use eye drops. Stop: Aspirin x 5 days before, no Motrin x 3 days before -Tylenol okay.  Do not take any Diabetic meds or insulin AM of.  Do not wear jewelry, make-up or nail polish.  Do not wear lotions, powders, or perfumes. You may wear deodorant.  Do not shave 12 hours prior to first CHG shower(legs and under arms).(face and neck okay.)  Do not bring valuables to the hospital.  Contacts, dentures or bridgework,body piercing,  may not be worn into surgery.  Leave suitcase in the car. After surgery it may be brought to your room.  For patients admitted to the hospital, checkout time is 11:00 AM the day of discharge.   Patients discharged the day of surgery will not be allowed to drive home. Must have responsible person with you x 24 hours once discharged.  Name and phone number of your driver: Danne Scardina, spouse [phone] h  Special Instructions: CHG(Chlorhedine 4%-"Hibiclens","Betasept","Aplicare") Shower Use Special Wash: see special instructions.(avoid face and genitals)   Please read over the following fact sheets that you were given: MRSA Information, Blood Transfusion fact sheet, Incentive Spirometry Instruction.    Failure to follow these instructions may result in Cancellation of your surgery.   Patient signature_______________________________________________________

## 2013-01-07 ENCOUNTER — Encounter (HOSPITAL_COMMUNITY): Payer: Self-pay

## 2013-01-07 ENCOUNTER — Encounter (HOSPITAL_COMMUNITY)
Admission: RE | Admit: 2013-01-07 | Discharge: 2013-01-07 | Disposition: A | Discharge status: Home / Self Care | Payer: Medicare Other | Source: Ambulatory Visit | Attending: Orthopedic Surgery | Admitting: Orthopedic Surgery

## 2013-01-07 DIAGNOSIS — M1710 Unilateral primary osteoarthritis, unspecified knee: Secondary | ICD-10-CM | POA: Insufficient documentation

## 2013-01-07 DIAGNOSIS — H9190 Unspecified hearing loss, unspecified ear: Secondary | ICD-10-CM

## 2013-01-07 DIAGNOSIS — Z8619 Personal history of other infectious and parasitic diseases: Secondary | ICD-10-CM

## 2013-01-07 DIAGNOSIS — Z01812 Encounter for preprocedural laboratory examination: Secondary | ICD-10-CM | POA: Insufficient documentation

## 2013-01-07 HISTORY — DX: Unspecified hearing loss, unspecified ear: H91.90

## 2013-01-07 HISTORY — DX: Personal history of other infectious and parasitic diseases: Z86.19

## 2013-01-07 HISTORY — PX: KNEE ARTHROSCOPY: SUR90

## 2013-01-07 LAB — COMPREHENSIVE METABOLIC PANEL
AST: 17 U/L (ref 0–37)
Albumin: 3.4 g/dL — ABNORMAL LOW (ref 3.5–5.2)
Calcium: 9.4 mg/dL (ref 8.4–10.5)
Creatinine, Ser: 0.9 mg/dL (ref 0.50–1.10)
Total Protein: 6.4 g/dL (ref 6.0–8.3)

## 2013-01-07 LAB — URINALYSIS, ROUTINE W REFLEX MICROSCOPIC
Ketones, ur: NEGATIVE mg/dL
Leukocytes, UA: NEGATIVE
Nitrite: NEGATIVE
Specific Gravity, Urine: 1.03 (ref 1.005–1.030)
pH: 5.5 (ref 5.0–8.0)

## 2013-01-07 LAB — URINE MICROSCOPIC-ADD ON

## 2013-01-07 LAB — CBC
MCH: 31.3 pg (ref 26.0–34.0)
MCV: 90.6 fL (ref 78.0–100.0)
Platelets: 191 10*3/uL (ref 150–400)
RDW: 15.1 % (ref 11.5–15.5)
WBC: 12.3 10*3/uL — ABNORMAL HIGH (ref 4.0–10.5)

## 2013-01-07 LAB — PROTIME-INR: INR: 0.95 (ref 0.00–1.49)

## 2013-01-07 NOTE — Pre-Procedure Instructions (Signed)
01-07-13 EKG-2'14, Cxr- 6'13-Epic. Lov 2'14 note Dr. Valora Piccolo.W. Kennon Portela

## 2013-01-07 NOTE — Progress Notes (Signed)
01-07-13 labs viewable in Epic. CBG will be checked AM of fasting.W. Kennon Portela

## 2013-01-07 NOTE — Telephone Encounter (Signed)
Pt was seen in office.

## 2013-01-08 ENCOUNTER — Telehealth: Payer: Self-pay | Admitting: *Deleted

## 2013-01-08 NOTE — Telephone Encounter (Signed)
Do glipizide metformin

## 2013-01-08 NOTE — Telephone Encounter (Signed)
Ins will not cover glyburide/metformin until pt has tried and failed glipizide/metformin.  What do you wish to do. thanks

## 2013-01-11 ENCOUNTER — Other Ambulatory Visit: Payer: Self-pay | Admitting: *Deleted

## 2013-01-11 MED ORDER — GLIPIZIDE-METFORMIN HCL 5-500 MG PO TABS: 2.0000 | ORAL_TABLET | Freq: Two times a day (BID) | ORAL | Status: DC

## 2013-01-16 ENCOUNTER — Other Ambulatory Visit: Payer: Self-pay | Admitting: Orthopedic Surgery

## 2013-01-16 MED ORDER — TRANEXAMIC ACID 100 MG/ML IV SOLN: 1000.0000 mg | INTRAVENOUS | Status: DC

## 2013-01-16 NOTE — H&P (Signed)
Kristy [name]  DOB: Nov 18, 1927 Married / Language: English / Race: White Female  Date of Admission:  01/18/2013  Chief Complaint:  Left Knee Pain  History of Present Illness The patient is a 79 year old female who comes in for a preoperative History and Physical. The patient is scheduled for a left total knee arthroplasty to be performed by Dr. Gus Rankin. Aluisio, MD at [facility] on 01/18/2013. The patient is a 79 year old female who presents for follow up of their knee. The patient is being followed for their left knee pain and osteoarthritis. They are now out from a cortisone injection done in [place]. Symptoms reported today include: pain. Note for "Follow-up Knee": She states the injection only helped for about a month or so ago. The knee is getting progressively worse. The injection felt great for about a month but then the pain came back as bad as ever. She is not having swelling. She is not having lower extremity weakness or paresthesia with this. She is at a stage now where she feels as though something more permanent needs to be done for the knee. She is ready to proceed with surgery at this time. The knee is limiting what she can and cannot do. They have been treated conservatively in the past for the above stated problem and despite conservative measures, they continue to have progressive pain and severe functional limitations and dysfunction. They have failed non-operative management including home exercise, medications, and injections. It is felt that they would benefit from undergoing total joint replacement. Risks and benefits of the procedure have been discussed with the patient and they elect to proceed with surgery. There are no active contraindications to surgery such as ongoing infection or rapidly progressive neurological disease.   Problem List Primary osteoarthritis of one knee (715.16)   Allergies No Known Drug Allergies. 09/29/2012   Family  History No pertinent family history   Social History Living situation. live with spouse Marital status. married Illicit drug use. no Drug/Alcohol Rehab (Previously). no Exercise. Exercises rarely; does running / walking Tobacco use. never smoker Pain Contract. no Tobacco / smoke exposure. no Current work status. retired Financial planner (Currently). no Alcohol use. current drinker; drinks wine; only occasionally per week Children. 1 Post-Surgical Plans. Plan for home. Advance Directives. Living Will, Healthcare POA   Medication History GlyBURIDE-MetFORMIN (5-500MG  Tablet, Oral) Active. Estradiol (1MG  Tablet, Oral) Active. Metoprolol Succinate ER (25MG  Tablet ER 24HR, Oral) Active. Pravastatin Sodium (80MG  Tablet, Oral) Active. Zetia (10MG  Tablet, Oral) Active. Fenofibrate Micronized (134MG  Capsule, Oral) Active. Aspirin Childrens (81MG  Tablet Chewable, Oral) Active. Fish Oil ( Oral) Specific dose unknown - Active.    Past Surgical History Spinal Surgery. Date: 2013. Gallbladder Surgery. Date: 62. open Hysterectomy. Date: 31. complete (non-cancerous)   Medial History Diabetes Mellitus, Type II Impaired Hearing Shingles. March 2014   Review of Systems General:Not Present- Chills, Fever, Night Sweats, Fatigue, Weight Gain, Weight Loss and Memory Loss. Skin:Not Present- Hives, Itching, Rash, Eczema and Lesions. HEENT:Not Present- Tinnitus, Headache, Double Vision, Visual Loss, Hearing Loss and Dentures. Respiratory:Not Present- Shortness of breath with exertion, Shortness of breath at rest, Allergies, Coughing up blood and Chronic Cough. Cardiovascular:Not Present- Chest Pain, Racing/skipping heartbeats, Difficulty Breathing Lying Down, Murmur, Swelling and Palpitations. Gastrointestinal:Not Present- Bloody Stool, Heartburn, Abdominal Pain, Vomiting, Nausea, Constipation, Diarrhea, Difficulty Swallowing, Jaundice and Loss of  appetitie. Female Genitourinary:Not Present- Blood in Urine, Urinary frequency, Weak urinary stream, Discharge, Flank Pain, Incontinence, Painful Urination, Urgency, Urinary Retention  and Urinating at Night. Musculoskeletal:Present- Joint Pain. Not Present- Muscle Weakness, Muscle Pain, Joint Swelling, Back Pain, Morning Stiffness and Spasms. Neurological:Not Present- Tremor, Dizziness, Blackout spells, Paralysis, Difficulty with balance and Weakness. Psychiatric:Not Present- Insomnia.   Vitals Pulse: 72 (Regular) Resp.: 14 (Unlabored) BP: 132/58 (Sitting, Right Arm, Standard)    Physical Exam The physical exam findings are as follows:  Note: Patient is an 79 year old female with continued knee pain. Patient is accompanied today by her husband.   General Mental Status - Alert, cooperative and good historian. General Appearance- pleasant. Not in acute distress. Orientation- Oriented X3. Build & Nutrition- Well nourished and Well developed.   Head and Neck Head- normocephalic, atraumatic . Neck Global Assessment- supple. no bruit auscultated on the right and no bruit auscultated on the left.   Eye Pupil- Bilateral- Regular and Round. Motion- Bilateral- EOMI.   Chest and Lung Exam Auscultation: Breath sounds:- clear at anterior chest wall and - clear at posterior chest wall. Adventitious sounds:- No Adventitious sounds.   Cardiovascular Auscultation:Rhythm- Regular rate and rhythm. Heart Sounds- S1 WNL and S2 WNL. Murmurs & Other Heart Sounds:Auscultation of the heart reveals - No Murmurs.   Abdomen Palpation/Percussion:Tenderness- Abdomen is non-tender to palpation. Rigidity (guarding)- Abdomen is soft. Auscultation:Auscultation of the abdomen reveals - Bowel sounds normal.   Female Genitourinary Not done, not pertinent to present illness  Musculoskeletal She is a well developed female. She is alert and oriented. No  apparent distress. Evaluation of her hips show a normal range of motion. No discomfort. The right knee shows no effusion. Slight varus. Range is 5-125. No tenderness or instability. The left knee shows a significant varus. Range is about 5-115. Marked crepitus on range of motion. Tenderness medial greater than lateral but tenderness on both aspects. No instability noted. Pulse, sensation and motor are intact. There is a slight peripheral edema.  RADIOGRAPHS: End stage arthritis of the left knee, bone on bone, medial and patellofemoral with a slight varus.  Assessment & Plan Primary osteoarthritis of one knee (715.16) Impression: Left Knee  Note: Plan is for a Left Total Knee Replacement by Dr. Lequita Halt.  Plan is to go home.  PCP - Dr. Vernon Prey  Signed electronically by Roberts Gaudy, PA-C

## 2013-01-18 ENCOUNTER — Inpatient Hospital Stay (HOSPITAL_COMMUNITY)
Admission: RE | Admit: 2013-01-18 | Discharge: 2013-01-21 | DRG: 470 | Disposition: A | Discharge status: Skilled Nursing Facility | Payer: Medicare Other | Source: Ambulatory Visit | Attending: Orthopedic Surgery | Admitting: Orthopedic Surgery

## 2013-01-18 ENCOUNTER — Inpatient Hospital Stay (HOSPITAL_COMMUNITY): Payer: Medicare Other | Admitting: Anesthesiology

## 2013-01-18 ENCOUNTER — Encounter (HOSPITAL_COMMUNITY): Payer: Self-pay | Admitting: Anesthesiology

## 2013-01-18 ENCOUNTER — Encounter (HOSPITAL_COMMUNITY): Payer: Self-pay | Admitting: *Deleted

## 2013-01-18 ENCOUNTER — Encounter (HOSPITAL_COMMUNITY)
Admission: RE | Disposition: A | Discharge status: Skilled Nursing Facility | Payer: Self-pay | Source: Ambulatory Visit | Attending: Orthopedic Surgery

## 2013-01-18 DIAGNOSIS — M1710 Unilateral primary osteoarthritis, unspecified knee: Principal | ICD-10-CM | POA: Diagnosis present

## 2013-01-18 DIAGNOSIS — T40605A Adverse effect of unspecified narcotics, initial encounter: Secondary | ICD-10-CM | POA: Diagnosis not present

## 2013-01-18 DIAGNOSIS — F411 Generalized anxiety disorder: Secondary | ICD-10-CM | POA: Diagnosis present

## 2013-01-18 DIAGNOSIS — E119 Type 2 diabetes mellitus without complications: Secondary | ICD-10-CM | POA: Diagnosis present

## 2013-01-18 DIAGNOSIS — I10 Essential (primary) hypertension: Secondary | ICD-10-CM | POA: Diagnosis present

## 2013-01-18 DIAGNOSIS — M179 Osteoarthritis of knee, unspecified: Secondary | ICD-10-CM | POA: Diagnosis present

## 2013-01-18 DIAGNOSIS — E7800 Pure hypercholesterolemia, unspecified: Secondary | ICD-10-CM | POA: Diagnosis present

## 2013-01-18 DIAGNOSIS — H9190 Unspecified hearing loss, unspecified ear: Secondary | ICD-10-CM | POA: Diagnosis present

## 2013-01-18 DIAGNOSIS — T8859XA Other complications of anesthesia, initial encounter: Secondary | ICD-10-CM

## 2013-01-18 DIAGNOSIS — F19921 Other psychoactive substance use, unspecified with intoxication with delirium: Secondary | ICD-10-CM | POA: Diagnosis not present

## 2013-01-18 DIAGNOSIS — Z01812 Encounter for preprocedural laboratory examination: Secondary | ICD-10-CM

## 2013-01-18 HISTORY — PX: TOTAL KNEE ARTHROPLASTY: SHX125

## 2013-01-18 HISTORY — DX: Other complications of anesthesia, initial encounter: T88.59XA

## 2013-01-18 LAB — GLUCOSE, CAPILLARY
Glucose-Capillary: 173 mg/dL — ABNORMAL HIGH (ref 70–99)
Glucose-Capillary: 227 mg/dL — ABNORMAL HIGH (ref 70–99)

## 2013-01-18 LAB — ABO/RH: ABO/RH(D): A POS

## 2013-01-18 SURGERY — ARTHROPLASTY, KNEE, TOTAL
Anesthesia: General | Site: Knee | Laterality: Left | Wound class: Clean

## 2013-01-18 MED ORDER — SODIUM CHLORIDE 0.9 % IV SOLN: INTRAVENOUS | Status: DC

## 2013-01-18 MED ORDER — TRANEXAMIC ACID 100 MG/ML IV SOLN
1000.0000 mg | INTRAVENOUS | Status: AC
  Administered 2013-01-18: 1000 mg via INTRAVENOUS
  Filled 2013-01-18: qty 10

## 2013-01-18 MED ORDER — GLYBURIDE-METFORMIN 5-500 MG PO TABS: 1.0000 | ORAL_TABLET | Freq: Two times a day (BID) | ORAL | Status: DC

## 2013-01-18 MED ORDER — GLYBURIDE 5 MG PO TABS
5.0000 mg | ORAL_TABLET | Freq: Two times a day (BID) | ORAL | Status: DC
  Administered 2013-01-18 – 2013-01-21 (×6): 5 mg via ORAL
  Filled 2013-01-18 (×8): qty 1

## 2013-01-18 MED ORDER — SODIUM CHLORIDE 0.9 % IJ SOLN
INTRAMUSCULAR | Status: DC | PRN
  Administered 2013-01-18: 09:00:00

## 2013-01-18 MED ORDER — LACTATED RINGERS IV SOLN
INTRAVENOUS | Status: DC
Start: 2013-01-18 — End: 2013-01-18
  Administered 2013-01-18: 1000 mL via INTRAVENOUS

## 2013-01-18 MED ORDER — SODIUM CHLORIDE 0.9 % IV SOLN
INTRAVENOUS | Status: DC
  Administered 2013-01-18: 17:00:00 via INTRAVENOUS

## 2013-01-18 MED ORDER — PHENOL 1.4 % MT LIQD: 1.0000 | OROMUCOSAL | Status: DC | PRN

## 2013-01-18 MED ORDER — EZETIMIBE 10 MG PO TABS
5.0000 mg | ORAL_TABLET | Freq: Every morning | ORAL | Status: DC
  Administered 2013-01-18 – 2013-01-21 (×4): 5 mg via ORAL
  Filled 2013-01-18 (×4): qty 0.5

## 2013-01-18 MED ORDER — GABAPENTIN 300 MG PO CAPS
300.0000 mg | ORAL_CAPSULE | Freq: Four times a day (QID) | ORAL | Status: DC
Start: 2013-01-18 — End: 2013-01-21
  Administered 2013-01-18 – 2013-01-21 (×10): 300 mg via ORAL
  Filled 2013-01-18 (×14): qty 1

## 2013-01-18 MED ORDER — ONDANSETRON HCL 4 MG/2ML IJ SOLN
INTRAMUSCULAR | Status: DC | PRN
  Administered 2013-01-18: 4 mg via INTRAVENOUS

## 2013-01-18 MED ORDER — FENTANYL CITRATE 0.05 MG/ML IJ SOLN
INTRAMUSCULAR | Status: DC | PRN
  Administered 2013-01-18 (×2): 50 ug via INTRAVENOUS

## 2013-01-18 MED ORDER — SUCCINYLCHOLINE CHLORIDE 20 MG/ML IJ SOLN
INTRAMUSCULAR | Status: DC | PRN
  Administered 2013-01-18: 100 mg via INTRAVENOUS

## 2013-01-18 MED ORDER — INSULIN ASPART 100 UNIT/ML ~~LOC~~ SOLN
0.0000 [IU] | Freq: Three times a day (TID) | SUBCUTANEOUS | Status: DC
  Administered 2013-01-18: 5 [IU] via SUBCUTANEOUS
  Administered 2013-01-19: 2 [IU] via SUBCUTANEOUS
  Administered 2013-01-19 – 2013-01-20 (×4): 3 [IU] via SUBCUTANEOUS
  Administered 2013-01-21: 2 [IU] via SUBCUTANEOUS

## 2013-01-18 MED ORDER — METOPROLOL SUCCINATE ER 25 MG PO TB24
25.0000 mg | ORAL_TABLET | Freq: Every morning | ORAL | Status: DC
  Administered 2013-01-19 – 2013-01-21 (×3): 25 mg via ORAL
  Filled 2013-01-18 (×3): qty 1

## 2013-01-18 MED ORDER — DOCUSATE SODIUM 100 MG PO CAPS
100.0000 mg | ORAL_CAPSULE | Freq: Two times a day (BID) | ORAL | Status: DC
  Administered 2013-01-18 – 2013-01-21 (×6): 100 mg via ORAL

## 2013-01-18 MED ORDER — ACETAMINOPHEN 10 MG/ML IV SOLN
1000.0000 mg | Freq: Once | INTRAVENOUS | Status: AC
  Administered 2013-01-18: 1000 mg via INTRAVENOUS

## 2013-01-18 MED ORDER — BISACODYL 10 MG RE SUPP: 10.0000 mg | Freq: Every day | RECTAL | Status: DC | PRN

## 2013-01-18 MED ORDER — LACTATED RINGERS IV SOLN
INTRAVENOUS | Status: DC | PRN
  Administered 2013-01-18 (×2): via INTRAVENOUS

## 2013-01-18 MED ORDER — HYPROMELLOSE (GONIOSCOPIC) 2.5 % OP SOLN
1.0000 [drp] | Freq: Two times a day (BID) | OPHTHALMIC | Status: DC | PRN
  Filled 2013-01-18: qty 15

## 2013-01-18 MED ORDER — MENTHOL 3 MG MT LOZG
1.0000 | LOZENGE | OROMUCOSAL | Status: DC | PRN
  Filled 2013-01-18: qty 9

## 2013-01-18 MED ORDER — POLYVINYL ALCOHOL 1.4 % OP SOLN
1.0000 [drp] | Freq: Two times a day (BID) | OPHTHALMIC | Status: DC | PRN
  Filled 2013-01-18: qty 15

## 2013-01-18 MED ORDER — GLIPIZIDE-METFORMIN HCL 5-500 MG PO TABS: 2.0000 | ORAL_TABLET | Freq: Two times a day (BID) | ORAL | Status: DC

## 2013-01-18 MED ORDER — CHLORHEXIDINE GLUCONATE 4 % EX LIQD
60.0000 mL | Freq: Once | CUTANEOUS | Status: DC
  Filled 2013-01-18: qty 60

## 2013-01-18 MED ORDER — GLIPIZIDE 10 MG PO TABS
10.0000 mg | ORAL_TABLET | Freq: Two times a day (BID) | ORAL | Status: DC
  Filled 2013-01-18 (×2): qty 1

## 2013-01-18 MED ORDER — OXYCODONE HCL 5 MG PO TABS
5.0000 mg | ORAL_TABLET | ORAL | Status: DC | PRN
  Administered 2013-01-18 – 2013-01-19 (×3): 5 mg via ORAL
  Administered 2013-01-19: 10 mg via ORAL
  Administered 2013-01-19: 5 mg via ORAL
  Filled 2013-01-18 (×2): qty 1
  Filled 2013-01-18: qty 2
  Filled 2013-01-18: qty 1

## 2013-01-18 MED ORDER — ONDANSETRON HCL 4 MG/2ML IJ SOLN
4.0000 mg | Freq: Four times a day (QID) | INTRAMUSCULAR | Status: DC | PRN
  Administered 2013-01-18: 4 mg via INTRAVENOUS
  Filled 2013-01-18 (×2): qty 2

## 2013-01-18 MED ORDER — SODIUM CHLORIDE 0.9 % IR SOLN
Status: DC | PRN
  Administered 2013-01-18: 1000 mL

## 2013-01-18 MED ORDER — ACETAMINOPHEN 325 MG PO TABS: 650.0000 mg | ORAL_TABLET | Freq: Four times a day (QID) | ORAL | Status: DC | PRN

## 2013-01-18 MED ORDER — ACETAMINOPHEN 650 MG RE SUPP: 650.0000 mg | Freq: Four times a day (QID) | RECTAL | Status: DC | PRN

## 2013-01-18 MED ORDER — RIVAROXABAN 10 MG PO TABS
10.0000 mg | ORAL_TABLET | Freq: Every day | ORAL | Status: DC
  Administered 2013-01-19 – 2013-01-21 (×3): 10 mg via ORAL
  Filled 2013-01-18 (×4): qty 1

## 2013-01-18 MED ORDER — TRAMADOL HCL 50 MG PO TABS
50.0000 mg | ORAL_TABLET | Freq: Four times a day (QID) | ORAL | Status: DC | PRN
  Administered 2013-01-18 – 2013-01-21 (×3): 50 mg via ORAL
  Filled 2013-01-18 (×3): qty 1

## 2013-01-18 MED ORDER — CEFAZOLIN SODIUM-DEXTROSE 2-3 GM-% IV SOLR
2.0000 g | INTRAVENOUS | Status: AC
  Administered 2013-01-18: 2 g via INTRAVENOUS

## 2013-01-18 MED ORDER — ACETAMINOPHEN 10 MG/ML IV SOLN
1000.0000 mg | Freq: Four times a day (QID) | INTRAVENOUS | Status: AC
  Administered 2013-01-18 – 2013-01-19 (×4): 1000 mg via INTRAVENOUS
  Filled 2013-01-18 (×7): qty 100

## 2013-01-18 MED ORDER — ONDANSETRON HCL 4 MG PO TABS: 4.0000 mg | ORAL_TABLET | Freq: Four times a day (QID) | ORAL | Status: DC | PRN

## 2013-01-18 MED ORDER — METOCLOPRAMIDE HCL 10 MG PO TABS: 5.0000 mg | ORAL_TABLET | Freq: Three times a day (TID) | ORAL | Status: DC | PRN

## 2013-01-18 MED ORDER — FLEET ENEMA 7-19 GM/118ML RE ENEM: 1.0000 | ENEMA | Freq: Once | RECTAL | Status: AC | PRN

## 2013-01-18 MED ORDER — POLYETHYLENE GLYCOL 3350 17 G PO PACK: 17.0000 g | PACK | Freq: Every day | ORAL | Status: DC | PRN

## 2013-01-18 MED ORDER — LACTATED RINGERS IV SOLN: INTRAVENOUS | Status: DC

## 2013-01-18 MED ORDER — METHOCARBAMOL 500 MG PO TABS
500.0000 mg | ORAL_TABLET | Freq: Four times a day (QID) | ORAL | Status: DC | PRN
  Administered 2013-01-18: 500 mg via ORAL
  Filled 2013-01-18: qty 1

## 2013-01-18 MED ORDER — MORPHINE SULFATE 2 MG/ML IJ SOLN
1.0000 mg | INTRAMUSCULAR | Status: DC | PRN
  Administered 2013-01-18 – 2013-01-19 (×2): 2 mg via INTRAVENOUS
  Filled 2013-01-18 (×2): qty 1

## 2013-01-18 MED ORDER — DIPHENHYDRAMINE HCL 12.5 MG/5ML PO ELIX: 12.5000 mg | ORAL_SOLUTION | ORAL | Status: DC | PRN

## 2013-01-18 MED ORDER — FUROSEMIDE 40 MG PO TABS
40.0000 mg | ORAL_TABLET | ORAL | Status: DC
  Administered 2013-01-21: 40 mg via ORAL
  Filled 2013-01-18: qty 1

## 2013-01-18 MED ORDER — LIDOCAINE HCL (CARDIAC) 20 MG/ML IV SOLN
INTRAVENOUS | Status: DC | PRN
  Administered 2013-01-18: 100 mg via INTRAVENOUS

## 2013-01-18 MED ORDER — METFORMIN HCL 500 MG PO TABS
1000.0000 mg | ORAL_TABLET | Freq: Two times a day (BID) | ORAL | Status: DC
  Filled 2013-01-18 (×2): qty 2

## 2013-01-18 MED ORDER — HYDROMORPHONE HCL PF 1 MG/ML IJ SOLN: 0.2500 mg | INTRAMUSCULAR | Status: DC | PRN

## 2013-01-18 MED ORDER — PROPOFOL 10 MG/ML IV BOLUS
INTRAVENOUS | Status: DC | PRN
  Administered 2013-01-18: 160 mg via INTRAVENOUS

## 2013-01-18 MED ORDER — BUPIVACAINE LIPOSOME 1.3 % IJ SUSP
20.0000 mL | Freq: Once | INTRAMUSCULAR | Status: DC
  Filled 2013-01-18: qty 20

## 2013-01-18 MED ORDER — CEFAZOLIN SODIUM 1-5 GM-% IV SOLN
1.0000 g | Freq: Four times a day (QID) | INTRAVENOUS | Status: AC
  Administered 2013-01-18 (×2): 1 g via INTRAVENOUS
  Filled 2013-01-18 (×2): qty 50

## 2013-01-18 MED ORDER — METFORMIN HCL 500 MG PO TABS
500.0000 mg | ORAL_TABLET | Freq: Two times a day (BID) | ORAL | Status: DC
  Administered 2013-01-18 – 2013-01-21 (×6): 500 mg via ORAL
  Filled 2013-01-18 (×8): qty 1

## 2013-01-18 MED ORDER — METHOCARBAMOL 100 MG/ML IJ SOLN
500.0000 mg | Freq: Four times a day (QID) | INTRAVENOUS | Status: DC | PRN
  Filled 2013-01-18: qty 5

## 2013-01-18 MED ORDER — METOCLOPRAMIDE HCL 5 MG/ML IJ SOLN
5.0000 mg | Freq: Three times a day (TID) | INTRAMUSCULAR | Status: DC | PRN
Start: 2013-01-18 — End: 2013-01-21

## 2013-01-18 MED ORDER — HYDROMORPHONE HCL PF 1 MG/ML IJ SOLN
INTRAMUSCULAR | Status: DC | PRN
  Administered 2013-01-18: 0.5 mg via INTRAVENOUS

## 2013-01-18 MED ORDER — ALPRAZOLAM 0.25 MG PO TABS: 0.2500 mg | ORAL_TABLET | Freq: Three times a day (TID) | ORAL | Status: DC | PRN

## 2013-01-18 SURGICAL SUPPLY — 59 items
BAG SPEC THK2 15X12 ZIP CLS (MISCELLANEOUS) ×1
BAG ZIPLOCK 12X15 (MISCELLANEOUS) ×2 IMPLANT
BANDAGE ELASTIC 6 VELCRO ST LF (GAUZE/BANDAGES/DRESSINGS) ×2 IMPLANT
BANDAGE ESMARK 6X9 LF (GAUZE/BANDAGES/DRESSINGS) ×1 IMPLANT
BLADE SAG 18X100X1.27 (BLADE) ×2 IMPLANT
BLADE SAW SGTL 11.0X1.19X90.0M (BLADE) ×2 IMPLANT
BNDG CMPR 9X6 STRL LF SNTH (GAUZE/BANDAGES/DRESSINGS) ×1
BNDG ESMARK 6X9 LF (GAUZE/BANDAGES/DRESSINGS) ×2
BOWL SMART MIX CTS (DISPOSABLE) ×2 IMPLANT
CEMENT HV SMART SET (Cement) ×4 IMPLANT
CLOTH BEACON ORANGE TIMEOUT ST (SAFETY) ×2 IMPLANT
CUFF TOURN SGL QUICK 34 (TOURNIQUET CUFF) ×2
CUFF TRNQT CYL 34X4X40X1 (TOURNIQUET CUFF) ×1 IMPLANT
DRAPE EXTREMITY T 121X128X90 (DRAPE) ×2 IMPLANT
DRAPE POUCH INSTRU U-SHP 10X18 (DRAPES) ×2 IMPLANT
DRAPE U-SHAPE 47X51 STRL (DRAPES) ×2 IMPLANT
DRSG ADAPTIC 3X8 NADH LF (GAUZE/BANDAGES/DRESSINGS) ×2 IMPLANT
DRSG PAD ABDOMINAL 8X10 ST (GAUZE/BANDAGES/DRESSINGS) ×1 IMPLANT
DURAPREP 26ML APPLICATOR (WOUND CARE) ×2 IMPLANT
ELECT REM PT RETURN 9FT ADLT (ELECTROSURGICAL) ×2
ELECTRODE REM PT RTRN 9FT ADLT (ELECTROSURGICAL) ×1 IMPLANT
EVACUATOR 1/8 PVC DRAIN (DRAIN) ×2 IMPLANT
FACESHIELD LNG OPTICON STERILE (SAFETY) ×10 IMPLANT
GLOVE BIO SURGEON STRL SZ7.5 (GLOVE) ×2 IMPLANT
GLOVE BIO SURGEON STRL SZ8 (GLOVE) ×4 IMPLANT
GLOVE BIOGEL PI IND STRL 6.5 (GLOVE) IMPLANT
GLOVE BIOGEL PI IND STRL 8 (GLOVE) ×2 IMPLANT
GLOVE BIOGEL PI INDICATOR 6.5 (GLOVE) ×2
GLOVE BIOGEL PI INDICATOR 8 (GLOVE) ×2
GLOVE ECLIPSE 6.5 STRL STRAW (GLOVE) ×1 IMPLANT
GLOVE SURG SS PI 6.5 STRL IVOR (GLOVE) ×2 IMPLANT
GOWN STRL NON-REIN LRG LVL3 (GOWN DISPOSABLE) ×4 IMPLANT
GOWN STRL REIN XL XLG (GOWN DISPOSABLE) ×2 IMPLANT
HANDPIECE INTERPULSE COAX TIP (DISPOSABLE) ×2
IMMOBILIZER KNEE 20 (SOFTGOODS) ×2
IMMOBILIZER KNEE 20 THIGH 36 (SOFTGOODS) ×1 IMPLANT
KIT BASIN OR (CUSTOM PROCEDURE TRAY) ×2 IMPLANT
MANIFOLD NEPTUNE II (INSTRUMENTS) ×2 IMPLANT
NDL SAFETY ECLIPSE 18X1.5 (NEEDLE) ×1 IMPLANT
NEEDLE HYPO 18GX1.5 SHARP (NEEDLE) ×2
NS IRRIG 1000ML POUR BTL (IV SOLUTION) ×2 IMPLANT
PACK TOTAL JOINT (CUSTOM PROCEDURE TRAY) ×2 IMPLANT
PAD ABD 7.5X8 STRL (GAUZE/BANDAGES/DRESSINGS) ×2 IMPLANT
PADDING CAST COTTON 6X4 STRL (CAST SUPPLIES) ×4 IMPLANT
POSITIONER SURGICAL ARM (MISCELLANEOUS) ×2 IMPLANT
SET HNDPC FAN SPRY TIP SCT (DISPOSABLE) ×1 IMPLANT
SPONGE GAUZE 4X4 12PLY (GAUZE/BANDAGES/DRESSINGS) ×2 IMPLANT
STRIP CLOSURE SKIN 1/2X4 (GAUZE/BANDAGES/DRESSINGS) ×4 IMPLANT
SUCTION FRAZIER 12FR DISP (SUCTIONS) ×2 IMPLANT
SUT MNCRL AB 4-0 PS2 18 (SUTURE) ×2 IMPLANT
SUT PDS AB 1 CT1 27 (SUTURE) ×1 IMPLANT
SUT VIC AB 2-0 CT1 27 (SUTURE) ×6
SUT VIC AB 2-0 CT1 TAPERPNT 27 (SUTURE) ×3 IMPLANT
SUT VLOC 180 0 24IN GS25 (SUTURE) ×2 IMPLANT
SYR 50ML LL SCALE MARK (SYRINGE) ×2 IMPLANT
TOWEL OR 17X26 10 PK STRL BLUE (TOWEL DISPOSABLE) ×4 IMPLANT
TRAY FOLEY CATH 14FRSI W/METER (CATHETERS) ×2 IMPLANT
WATER STERILE IRR 1500ML POUR (IV SOLUTION) ×3 IMPLANT
WRAP KNEE MAXI GEL POST OP (GAUZE/BANDAGES/DRESSINGS) ×4 IMPLANT

## 2013-01-18 NOTE — Anesthesia Preprocedure Evaluation (Addendum)
Anesthesia Evaluation  Patient identified by MRN, date of birth, ID band Patient awake    Reviewed: Allergy & Precautions, H&P , NPO status , Patient's Chart, lab work & pertinent test results, reviewed documented beta blocker date and time   Airway Mallampati: II TM Distance: >3 FB Neck ROM: full    Dental  (+) Caps and Dental Advisory Given All upper front are caps:   Pulmonary neg pulmonary ROS,  breath sounds clear to auscultation  Pulmonary exam normal       Cardiovascular Exercise Tolerance: Good hypertension, Pt. on home beta blockers Rhythm:regular Rate:Normal     Neuro/Psych History back surgery negative neurological ROS  negative psych ROS   GI/Hepatic negative GI ROS, Neg liver ROS,   Endo/Other  diabetes, Well Controlled, Type 2, Oral Hypoglycemic Agents  Renal/GU negative Renal ROS  negative genitourinary   Musculoskeletal   Abdominal   Peds  Hematology negative hematology ROS (+) Blood dyscrasia, , Chronic lymphocytic leukemia   Anesthesia Other Findings   Reproductive/Obstetrics negative OB ROS                          Anesthesia Physical Anesthesia Plan  ASA: III  Anesthesia Plan: General   Post-op Pain Management:    Induction: Intravenous  Airway Management Planned: Oral ETT  Additional Equipment:   Intra-op Plan:   Post-operative Plan: Extubation in OR  Informed Consent: I have reviewed the patients History and Physical, chart, labs and discussed the procedure including the risks, benefits and alternatives for the proposed anesthesia with the patient or authorized representative who has indicated his/her understanding and acceptance.   Dental Advisory Given  Plan Discussed with: CRNA and Surgeon  Anesthesia Plan Comments:        Anesthesia Quick Evaluation

## 2013-01-18 NOTE — Op Note (Signed)
Pre-operative diagnosis- Osteoarthritis  Left knee(s)  Post-operative diagnosis- Osteoarthritis Left knee(s)  Procedure-  Left  Total Knee Arthroplasty  Surgeon- Gus Rankin. Lizet Kelso, MD  Assistant- Leilani Able, PA-C   Anesthesia-  General EBL-* No blood loss amount entered *  Drains Hemovac  Tourniquet time-  Total Tourniquet Time Documented: Thigh (Left) - 33 minutes Total: Thigh (Left) - 33 minutes    Complications- None  Condition-PACU - hemodynamically stable.   Brief Clinical Note  Kristy [name] is a 79 y.o. year old female with end stage OA of her left knee with progressively worsening pain and dysfunction. She has constant pain, with activity and at rest and significant functional deficits with difficulties even with ADLs. She has had extensive non-op management including analgesics, injections of cortisone and viscosupplements, and home exercise program, but remains in significant pain with significant dysfunction. Radiographs show bone on bone arthritis medial and patellofemoral with varus deformity. She presents now for left Total Knee Arthroplasty.    Procedure in detail---   The patient is brought into the operating room and positioned supine on the operating table. After successful administration of  General,   a tourniquet is placed high on the  Left thigh(s) and the lower extremity is prepped and draped in the usual sterile fashion. Time out is performed by the operating team and then the  Left lower extremity is wrapped in Esmarch, knee flexed and the tourniquet inflated to 300 mmHg.       A midline incision is made with a ten blade through the subcutaneous tissue to the level of the extensor mechanism. A fresh blade is used to make a medial parapatellar arthrotomy. Soft tissue over the proximal medial tibia is subperiosteally elevated to the joint line with a knife and into the semimembranosus bursa with a Cobb elevator. Soft tissue over the proximal lateral tibia is  elevated with attention being paid to avoiding the patellar tendon on the tibial tubercle. The patella is everted, knee flexed 90 degrees and the ACL and PCL are removed. Findings are bone on bone medial and patellofemoral with large medial osteophytes.        The drill is used to create a starting hole in the distal femur and the canal is thoroughly irrigated with sterile saline to remove the fatty contents. The 5 degree Left  valgus alignment guide is placed into the femoral canal and the distal femoral cutting block is pinned to remove 11 mm off the distal femur. Resection is made with an oscillating saw.      The tibia is subluxed forward and the menisci are removed. The extramedullary alignment guide is placed referencing proximally at the medial aspect of the tibial tubercle and distally along the second metatarsal axis and tibial crest. The block is pinned to remove 2mm off the more deficient medial  side. Resection is made with an oscillating saw. Size 2.5 is the most appropriate size for the tibia and the proximal tibia is prepared with the modular drill and keel punch for that size.      The femoral sizing guide is placed and size 3 is most appropriate. Rotation is marked off the epicondylar axis and confirmed by creating a rectangular flexion gap at 90 degrees. The size 3 cutting block is pinned in this rotation and the anterior, posterior and chamfer cuts are made with the oscillating saw. The intercondylar block is then placed and that cut is made.      Trial size 2.5 tibial  component, trial size 3 posterior stabilized femur and a 12.5  mm posterior stabilized rotating platform insert trial is placed. Full extension is achieved with excellent varus/valgus and anterior/posterior balance throughout full range of motion. The patella is everted and thickness measured to be 22  mm. Free hand resection is taken to 12 mm, a 35 template is placed, lug holes are drilled, trial patella is placed, and it  tracks normally. Osteophytes are removed off the posterior femur with the trial in place. All trials are removed and the cut bone surfaces prepared with pulsatile lavage. Cement is mixed and once ready for implantation, the size 2.5 tibial implant, size  3 posterior stabilized femoral component, and the size 35 patella are cemented in place and the patella is held with the clamp. The trial insert is placed and the knee held in full extension. The Exparel (20 ml mixed with 50 ml saline) is injected into the extensor mechanism, posterior capsule, medial and lateral gutters and subcutaneous tissues.  All extruded cement is removed and once the cement is hard the permanent 12.5 mm posterior stabilized rotating platform insert is placed into the tibial tray.      The wound is copiously irrigated with saline solution and the extensor mechanism closed over a hemovac drain with #1 PDS suture. The tourniquet is released for a total tourniquet time of 33  minutes. Flexion against gravity is 140 degrees and the patella tracks normally. Subcutaneous tissue is closed with 2.0 vicryl and subcuticular with running 4.0 Monocryl. The incision is cleaned and dried and steri-strips and a bulky sterile dressing are applied. The limb is placed into a knee immobilizer and the patient is awakened and transported to recovery in stable condition.      Please note that a surgical assistant was a medical necessity for this procedure in order to perform it in a safe and expeditious manner. Surgical assistant was necessary to retract the ligaments and vital neurovascular structures to prevent injury to them and also necessary for proper positioning of the limb to allow for anatomic placement of the prosthesis.   Gus Rankin Gara Kincade, MD    01/18/2013, 9:12 AM

## 2013-01-18 NOTE — Transfer of Care (Signed)
Immediate Anesthesia Transfer of Care Note  Patient: Kristy [name]  Procedure(s) Performed: Procedure(s): TOTAL KNEE ARTHROPLASTY (Left)  Patient Location: PACU  Anesthesia Type:General  Level of Consciousness: sedated  Airway & Oxygen Therapy: Patient Spontanous Breathing and Patient connected to face mask oxygen  Post-op Assessment: Report given to PACU RN and Post -op Vital signs reviewed and stable  Post vital signs: Reviewed and stable  Complications: No apparent anesthesia complications

## 2013-01-18 NOTE — Progress Notes (Signed)
Utilization review completed.  

## 2013-01-18 NOTE — Interval H&P Note (Signed)
History and Physical Interval Note:  01/18/2013 6:54 AM  Kristy [name]  has presented today for surgery, with the diagnosis of OSTEOARTHRITIS LEFT KNEE  The various methods of treatment have been discussed with the patient and family. After consideration of risks, benefits and other options for treatment, the patient has consented to  Procedure(s): TOTAL KNEE ARTHROPLASTY (Left) as a surgical intervention .  The patient's history has been reviewed, patient examined, no change in status, stable for surgery.  I have reviewed the patient's chart and labs.  Questions were answered to the patient's satisfaction.     Loanne Drilling

## 2013-01-18 NOTE — Evaluation (Signed)
Physical Therapy Evaluation Patient Details Name: Kristy [name] MRN: [id number] DOB: 11-28-1927 Today's Date: 01/18/2013 Time: 4098-1191 PT Time Calculation (min): 35 min  PT Assessment / Plan / Recommendation Clinical Impression  *79 y/o female admitted 01/18/13 for LTKA . Pt. was able to mobilize to edge of bed but became nauseated with emesis. Pt. was left in bed. Pt. will benefit from PT while in acute care to return to home with family.    PT Assessment  Patient needs continued PT services    Follow Up Recommendations  Home health PT    Does the patient have the potential to tolerate intense rehabilitation      Barriers to Discharge        Equipment Recommendations  Rolling walker with 5" wheels    Recommendations for Other Services     Frequency 7X/week    Precautions / Restrictions Precautions Precautions: Knee Precaution Comments: + emesis Required Braces or Orthoses: Knee Immobilizer - Left Knee Immobilizer - Left: Discontinue once straight leg raise with < 10 degree lag   Pertinent Vitals/Pain Pt with no pain, at times felt ache.      Mobility  Bed Mobility Bed Mobility: Supine to Sit;Sit to Supine;Sitting - Scoot to Edge of Bed Supine to Sit: 4: Min assist;HOB elevated Sitting - Scoot to [place] of Bed: 4: Min assist Sit to Supine: HOB flat Transfers Transfers: Sit to Stand;Stand to Sit Sit to Stand: 1: +2 Total assist Sit to Stand: Patient Percentage: 70% Stand to Sit: 1: +2 Total assist Stand to Sit: Patient Percentage: 70% Details for Transfer Assistance: cues for hand and LLE placement. Ambulation/Gait Ambulation/Gait Assistance: 1: +2 Total assist Ambulation/Gait: Patient Percentage: 60% Ambulation Distance (Feet): 4 Feet Assistive device: Rolling walker Ambulation/Gait Assistance Details: pt. took a few steps sideways along bed. Gait Pattern: Step-to pattern;Antalgic    Exercises Total Joint Exercises Straight Leg Raises: AROM;Left;5  reps;Supine   PT Diagnosis: Difficulty walking;Generalized weakness;Acute pain  PT Problem List: Decreased strength;Decreased range of motion;Decreased activity tolerance;Decreased mobility;Decreased knowledge of use of DME;Decreased safety awareness;Decreased knowledge of precautions;Pain PT Treatment Interventions: DME instruction;Gait training;Functional mobility training;Therapeutic activities;Therapeutic exercise;Patient/family education   PT Goals Acute Rehab PT Goals PT Goal Formulation: With patient/family Time For Goal Achievement: 01/25/13 Potential to Achieve Goals: Good Pt will go Supine/Side to Sit: with supervision PT Goal: Supine/Side to Sit - Progress: Goal set today Pt will go Sit to Supine/Side: with supervision PT Goal: Sit to Supine/Side - Progress: Goal set today Pt will go Sit to Stand: with supervision PT Goal: Sit to Stand - Progress: Goal set today Pt will go Stand to Sit: with supervision PT Goal: Stand to Sit - Progress: Goal set today Pt will Ambulate: 51 - 150 feet;with supervision;with rolling walker PT Goal: Ambulate - Progress: Goal set today Pt will Perform Home Exercise Program: with supervision, verbal cues required/provided PT Goal: Perform Home Exercise Program - Progress: Goal set today  Visit Information  Last PT Received On: 01/18/13 Assistance Needed: +2    Subjective Data  Subjective: I feel swimmy. I may get sick Patient Stated Goal: to go home.   Prior Functioning  Home Living Lives With: Spouse Available Help at Discharge: Family;Available 24 hours/day Type of Home: House Home Access: Level entry Home Layout: One level Bathroom Toilet: Handicapped height Home Adaptive Equipment: Walker - rolling;Bedside commode/3-in-1 Prior Function Level of Independence: Independent Communication Communication: HOH    Cognition  Cognition Arousal/Alertness: Awake/alert Behavior During Therapy: WFL for tasks assessed/performed  Overall  Cognitive Status: Within Functional Limits for tasks assessed    Extremity/Trunk Assessment Right Lower Extremity Assessment RLE ROM/Strength/Tone: WFL for tasks assessed RLE ROM/Strength/Tone Deficits: oes have DJD in knee RLE Sensation: WFL - Light Touch Left Lower Extremity Assessment LLE ROM/Strength/Tone: Deficits LLE ROM/Strength/Tone Deficits: able to perform SLR LLE Sensation: WFL - Light Touch Trunk Assessment Trunk Assessment: Normal   Balance    End of Session PT - End of Session Equipment Utilized During Treatment: Left lower extremity prosthesis Activity Tolerance: Patient limited by fatigue (limited by N/V) Patient left: in bed;with call bell/phone within reach;with family/visitor present Nurse Communication: Mobility status CPM Left Knee CPM Left Knee: Off  GP     Rada Hay 01/18/2013, 3:41 PM Blanchard Kelch PT [phone]

## 2013-01-18 NOTE — H&P (View-Only) (Signed)
Kristy [name]  DOB: 11/21/1927 Married / Language: English / Race: White Female  Date of Admission:  01/18/2013  Chief Complaint:  Left Knee Pain  History of Present Illness The patient is a 79 year old female who comes in for a preoperative History and Physical. The patient is scheduled for a left total knee arthroplasty to be performed by Dr. Frank V. Aluisio, MD at [facility] on 01/18/2013. The patient is a 79 year old female who presents for follow up of their knee. The patient is being followed for their left knee pain and osteoarthritis. They are now out from a cortisone injection done in [place]. Symptoms reported today include: pain. Note for "Follow-up Knee": She states the injection only helped for about a month or so ago. The knee is getting progressively worse. The injection felt great for about a month but then the pain came back as bad as ever. She is not having swelling. She is not having lower extremity weakness or paresthesia with this. She is at a stage now where she feels as though something more permanent needs to be done for the knee. She is ready to proceed with surgery at this time. The knee is limiting what she can and cannot do. They have been treated conservatively in the past for the above stated problem and despite conservative measures, they continue to have progressive pain and severe functional limitations and dysfunction. They have failed non-operative management including home exercise, medications, and injections. It is felt that they would benefit from undergoing total joint replacement. Risks and benefits of the procedure have been discussed with the patient and they elect to proceed with surgery. There are no active contraindications to surgery such as ongoing infection or rapidly progressive neurological disease.   Problem List Primary osteoarthritis of one knee (715.16)   Allergies No Known Drug Allergies. 09/29/2012   Family  History No pertinent family history   Social History Living situation. live with spouse Marital status. married Illicit drug use. no Drug/Alcohol Rehab (Previously). no Exercise. Exercises rarely; does running / walking Tobacco use. never smoker Pain Contract. no Tobacco / smoke exposure. no Current work status. retired Drug/Alcohol Rehab (Currently). no Alcohol use. current drinker; drinks wine; only occasionally per week Children. 1 Post-Surgical Plans. Plan for home. Advance Directives. Living Will, Healthcare POA   Medication History GlyBURIDE-MetFORMIN (5-500MG Tablet, Oral) Active. Estradiol (1MG Tablet, Oral) Active. Metoprolol Succinate ER (25MG Tablet ER 24HR, Oral) Active. Pravastatin Sodium (80MG Tablet, Oral) Active. Zetia (10MG Tablet, Oral) Active. Fenofibrate Micronized (134MG Capsule, Oral) Active. Aspirin Childrens (81MG Tablet Chewable, Oral) Active. Fish Oil ( Oral) Specific dose unknown - Active.    Past Surgical History Spinal Surgery. Date: 2013. Gallbladder Surgery. Date: 1989. open Hysterectomy. Date: 1987. complete (non-cancerous)   Medial History Diabetes Mellitus, Type II Impaired Hearing Shingles. March 2014   Review of Systems General:Not Present- Chills, Fever, Night Sweats, Fatigue, Weight Gain, Weight Loss and Memory Loss. Skin:Not Present- Hives, Itching, Rash, Eczema and Lesions. HEENT:Not Present- Tinnitus, Headache, Double Vision, Visual Loss, Hearing Loss and Dentures. Respiratory:Not Present- Shortness of breath with exertion, Shortness of breath at rest, Allergies, Coughing up blood and Chronic Cough. Cardiovascular:Not Present- Chest Pain, Racing/skipping heartbeats, Difficulty Breathing Lying Down, Murmur, Swelling and Palpitations. Gastrointestinal:Not Present- Bloody Stool, Heartburn, Abdominal Pain, Vomiting, Nausea, Constipation, Diarrhea, Difficulty Swallowing, Jaundice and Loss of  appetitie. Female Genitourinary:Not Present- Blood in Urine, Urinary frequency, Weak urinary stream, Discharge, Flank Pain, Incontinence, Painful Urination, Urgency, Urinary Retention   and Urinating at Night. Musculoskeletal:Present- Joint Pain. Not Present- Muscle Weakness, Muscle Pain, Joint Swelling, Back Pain, Morning Stiffness and Spasms. Neurological:Not Present- Tremor, Dizziness, Blackout spells, Paralysis, Difficulty with balance and Weakness. Psychiatric:Not Present- Insomnia.   Vitals Pulse: 72 (Regular) Resp.: 14 (Unlabored) BP: 132/58 (Sitting, Right Arm, Standard)    Physical Exam The physical exam findings are as follows:  Note: Patient is an 79 year old female with continued knee pain. Patient is accompanied today by her husband.   General Mental Status - Alert, cooperative and good historian. General Appearance- pleasant. Not in acute distress. Orientation- Oriented X3. Build & Nutrition- Well nourished and Well developed.   Head and Neck Head- normocephalic, atraumatic . Neck Global Assessment- supple. no bruit auscultated on the right and no bruit auscultated on the left.   Eye Pupil- Bilateral- Regular and Round. Motion- Bilateral- EOMI.   Chest and Lung Exam Auscultation: Breath sounds:- clear at anterior chest wall and - clear at posterior chest wall. Adventitious sounds:- No Adventitious sounds.   Cardiovascular Auscultation:Rhythm- Regular rate and rhythm. Heart Sounds- S1 WNL and S2 WNL. Murmurs & Other Heart Sounds:Auscultation of the heart reveals - No Murmurs.   Abdomen Palpation/Percussion:Tenderness- Abdomen is non-tender to palpation. Rigidity (guarding)- Abdomen is soft. Auscultation:Auscultation of the abdomen reveals - Bowel sounds normal.   Female Genitourinary Not done, not pertinent to present illness  Musculoskeletal She is a well developed female. She is alert and oriented. No  apparent distress. Evaluation of her hips show a normal range of motion. No discomfort. The right knee shows no effusion. Slight varus. Range is 5-125. No tenderness or instability. The left knee shows a significant varus. Range is about 5-115. Marked crepitus on range of motion. Tenderness medial greater than lateral but tenderness on both aspects. No instability noted. Pulse, sensation and motor are intact. There is a slight peripheral edema.  RADIOGRAPHS: End stage arthritis of the left knee, bone on bone, medial and patellofemoral with a slight varus.  Assessment & Plan Primary osteoarthritis of one knee (715.16) Impression: Left Knee  Note: Plan is for a Left Total Knee Replacement by Dr. Aluisio.  Plan is to go home.  PCP - Dr. Don Moore  Signed electronically by DREW L Coline Calkin, PA-C 

## 2013-01-18 NOTE — Anesthesia Postprocedure Evaluation (Signed)
  Anesthesia Post-op Note  Patient: Kristy [name]  Procedure(s) Performed: Procedure(s) (LRB): TOTAL KNEE ARTHROPLASTY (Left)  Patient Location: PACU  Anesthesia Type: General  Level of Consciousness: awake and alert   Airway and Oxygen Therapy: Patient Spontanous Breathing  Post-op Pain: mild  Post-op Assessment: Post-op Vital signs reviewed, Patient's Cardiovascular Status Stable, Respiratory Function Stable, Patent Airway and No signs of Nausea or vomiting  Last Vitals:  Filed Vitals:   01/18/13 1000  BP:   Pulse: 88  Temp:   Resp: 22    Post-op Vital Signs: stable   Complications: No apparent anesthesia complications

## 2013-01-19 LAB — CBC
HCT: 29.4 % — ABNORMAL LOW (ref 36.0–46.0)
Hemoglobin: 10.2 g/dL — ABNORMAL LOW (ref 12.0–15.0)
MCV: 89.6 fL (ref 78.0–100.0)
Platelets: 143 10*3/uL — ABNORMAL LOW (ref 150–400)
RBC: 3.28 MIL/uL — ABNORMAL LOW (ref 3.87–5.11)
WBC: 13.7 10*3/uL — ABNORMAL HIGH (ref 4.0–10.5)

## 2013-01-19 LAB — GLUCOSE, CAPILLARY
Glucose-Capillary: 194 mg/dL — ABNORMAL HIGH (ref 70–99)
Glucose-Capillary: 189 mg/dL — ABNORMAL HIGH (ref 70–99)

## 2013-01-19 LAB — BASIC METABOLIC PANEL
CO2: 24 mEq/L (ref 19–32)
Chloride: 103 mEq/L (ref 96–112)
Creatinine, Ser: 0.79 mg/dL (ref 0.50–1.10)
Glucose, Bld: 157 mg/dL — ABNORMAL HIGH (ref 70–99)

## 2013-01-19 MED ORDER — RIVAROXABAN 10 MG PO TABS: 10.0000 mg | ORAL_TABLET | Freq: Every day | ORAL | Status: DC

## 2013-01-19 MED ORDER — METHOCARBAMOL 500 MG PO TABS: 500.0000 mg | ORAL_TABLET | Freq: Four times a day (QID) | ORAL | Status: DC | PRN

## 2013-01-19 MED ORDER — TRAMADOL HCL 50 MG PO TABS: 50.0000 mg | ORAL_TABLET | Freq: Four times a day (QID) | ORAL | Status: DC | PRN

## 2013-01-19 MED ORDER — OXYCODONE HCL 5 MG PO TABS: 5.0000 mg | ORAL_TABLET | ORAL | Status: DC | PRN

## 2013-01-19 NOTE — Progress Notes (Signed)
Physical Therapy Treatment Patient Details Name: Kristy [name] MRN: [id number] DOB: 11/14/1927 Today's Date: 01/19/2013 Time: 0102-7253 PT Time Calculation (min): 33 min  PT Assessment / Plan / Recommendation Comments on Treatment Session  POD # 1 L TKR.  Returned after pain meds.  Assisted out of recliner to amb again.  Pt progressing slowly and requires MAX VC's on proper sequencing and walker to self placement. Very unsteady gait. Spoke to son reguarding safety at home.  He stated he would like to wait another day before thinking about poss of ST Rehab at [facility].    Follow Up Recommendations  Home health PT     Does the patient have the potential to tolerate intense rehabilitation     Barriers to Discharge        Equipment Recommendations  Rolling walker with 5" wheels    Recommendations for Other Services    Frequency 7X/week   Plan      Precautions / Restrictions Precautions Precautions: Knee Required Braces or Orthoses: Knee Immobilizer - Left Knee Immobilizer - Left: Discontinue once straight leg raise with < 10 degree lag Restrictions Weight Bearing Restrictions: No Other Position/Activity Restrictions: WBAT   Pertinent Vitals/Pain C/o 6/10 during gait Pre med ICE applied    Mobility  Bed Mobility Bed Mobility: Not assessed Details for Bed Mobility Assistance: Pt OOB in recliner Transfers Transfers: Sit to Stand;Stand to Sit Sit to Stand: 1: +1 Total assist Sit to Stand: Patient Percentage: 60% Stand to Sit: 3: Mod assist Stand to Sit: Patient Percentage: 70% Details for Transfer Assistance: 75% VC's on proper tech and hand placement.  very unsteady with X 2 posterior LOB in which pt rerquired extra physical assist. Pt had controlled fall back into chair at one point. Son present and observed level of difficulty pt was having. Ambulation/Gait Ambulation/Gait Assistance: 1: +1 Total assist Ambulation/Gait: Patient Percentage: 60% Ambulation Distance (Feet): 38  Feet Assistive device: Rolling walker Ambulation/Gait Assistance Details: Still requiring 100% VC's and 50% tactile queing for proper sequencing and walker advancement.  Very unsteady gait.  HIGH FALL RISK.  Son assisted by following with recliner. Addressed safety concern with son as they plan to have pt D/C to home.  Son stated he wanted to wait another day before making any decision about poss ST Rehab at [facility]. I consulted/updated this info to  LPT Gait Pattern: Step-to pattern;Antalgic;Decreased stance time - left;Narrow base of support Gait velocity: decreased General Gait Details: Very unsteady gait    Exercises   Total Knee Replacement TE's 10 reps B LE ankle pumps 10 reps knee presses 10 reps heel slides  10 reps SAQ's 10 reps SLR's 10 reps ABD Followed by ICE   PT Goals                                                          progressing    Visit Information  Last PT Received On: 01/19/13 Assistance Needed: +2    Subjective Data      Cognition    impaired/delayed   Balance   poor  End of Session PT - End of Session Equipment Utilized During Treatment: Left knee immobilizer;Gait belt Activity Tolerance: Patient limited by fatigue Patient left: in chair;with call bell/phone within reach;with family/visitor present  Felecia Shelling  PTA [facility]  Acute  Rehab Pager      [phone]

## 2013-01-19 NOTE — Progress Notes (Signed)
Physical Therapy Treatment Patient Details Name: Kristy [name] MRN: [id number] DOB: 04-16-1928 Today's Date: 01/19/2013 Time: 7253-6644 PT Time Calculation (min): 40 min  PT Assessment / Plan / Recommendation Comments on Treatment Session  POD #1 L TKR. Assisted OOB to ambulate again. Tho pt didn't experience any LOB this time, she is still very unsteady with her gait and needs Constant multi-modal cues for RW advancement, sequencing, and technique.     Follow Up Recommendations  Home health PT     Does the patient have the potential to tolerate intense rehabilitation     Barriers to Discharge        Equipment Recommendations  Rolling walker with 5" wheels    Recommendations for Other Services    Frequency 7X/week   Plan Discharge plan remains appropriate    Precautions / Restrictions Precautions Precautions: Knee Required Braces or Orthoses: Knee Immobilizer - Left Knee Immobilizer - Left: Discontinue once straight leg raise with < 10 degree lag Restrictions Weight Bearing Restrictions: No Other Position/Activity Restrictions: WBAT   Pertinent Vitals/Pain Pt rated her L Knee pain as 4/10 with ambulation and rest, tho she stated her L proximal thigh pain was more bothersome without rating.    Mobility  Bed Mobility Bed Mobility: Supine to Sit;Sit to Supine;Sitting - Scoot to Edge of Bed Supine to Sit: 3: Mod assist;HOB elevated Sitting - Scoot to [place] of Bed: 4: Min assist Sit to Supine: HOB flat;4: Min assist Details for Bed Mobility Assistance: Pt OOB in recliner Transfers Transfers: Sit to Stand;Stand to Sit Sit to Stand: 1: +1 Total assist Sit to Stand: Patient Percentage: 60% Stand to Sit: 3: Mod assist Stand to Sit: Patient Percentage: 70% Details for Transfer Assistance: 75% VC's on proper technique and hand placement. Very unsteady. Ambulation/Gait Ambulation/Gait Assistance: 1: +1 Total assist Ambulation/Gait: Patient Percentage: 60% Ambulation Distance  (Feet): 60 Feet Assistive device: Rolling walker Ambulation/Gait Assistance Details: Still requiring 100% VC's and 50% tactile cueing for proper sequencing and walker advancement. Very unsteady gait; HIGH FALL RISK. Gait Pattern: Step-to pattern;Antalgic;Decreased stance time - left;Narrow base of support Gait velocity: decreased General Gait Details: Very unsteady gait     :     PT Goals Acute Rehab PT Goals PT Goal Formulation: With patient/family Time For Goal Achievement: 01/25/13 Potential to Achieve Goals: Good Pt will go Supine/Side to Sit: with supervision PT Goal: Supine/Side to Sit - Progress: Progressing toward goal Pt will go Sit to Supine/Side: with supervision PT Goal: Sit to Supine/Side - Progress: Progressing toward goal Pt will go Sit to Stand: with supervision PT Goal: Sit to Stand - Progress: Progressing toward goal Pt will go Stand to Sit: with supervision PT Goal: Stand to Sit - Progress: Progressing toward goal Pt will Ambulate: 51 - 150 feet;with supervision;with rolling walker PT Goal: Ambulate - Progress: Progressing toward goal  Visit Information  Last PT Received On: 01/19/13 Assistance Needed: +2    Subjective Data   "I'm sorry, I'm just so tired."   Cognition    Fair+   Balance   Fair -  End of Session PT - End of Session Equipment Utilized During Treatment: Left knee immobilizer;Gait belt Activity Tolerance: Patient limited by fatigue Patient left: in bed;in CPM;with call bell/phone within reach CPM Left Knee CPM Left Knee: On Left Knee Flexion (Degrees): 40 Left Knee Extension (Degrees): 10 Additional Comments: CPM was placed at 2:45   GP     BROWN-SMEDLEY, NICOLE 01/19/2013, 3:16 PM  Felecia Shelling  PTA [facility]  Acute  Rehab Pager      [phone]

## 2013-01-19 NOTE — Progress Notes (Signed)
   Subjective: 1 Day Post-Op Procedure(s) (LRB): TOTAL KNEE ARTHROPLASTY (Left) Patient reports pain as mild.   We will start therapy today.  Plan is to go Home after hospital stay.  Objective: Vital signs in last 24 hours: Temp:  [97.3 F (36.3 C)-99.3 F (37.4 C)] 98.2 F (36.8 C) (04/22 0543) Pulse Rate:  [67-88] 84 (04/22 0543) Resp:  [13-22] 16 (04/22 0543) BP: (101-150)/(53-94) 112/57 mmHg (04/22 0543) SpO2:  [93 %-100 %] 93 % (04/22 0543) Weight:  [167 lb (75.751 kg)] 167 lb (75.751 kg) (04/21 1045)  Intake/Output from previous day:  Intake/Output Summary (Last 24 hours) at 01/19/13 0726 Last data filed at 01/19/13 0600  Gross per 24 hour  Intake   3275 ml  Output   1700 ml  Net   1575 ml    Intake/Output this shift:    Labs:  Recent Labs  01/19/13 0445  HGB 10.2*    Recent Labs  01/19/13 0445  WBC 13.7*  RBC 3.28*  HCT 29.4*  PLT 143*    Recent Labs  01/19/13 0445  NA 134*  K 4.1  CL 103  CO2 24  BUN 12  CREATININE 0.79  GLUCOSE 157*  CALCIUM 8.9   No results found for this basename: LABPT, INR,  in the last 72 hours  EXAM General - Patient is Alert, Appropriate and Oriented Extremity - Neurologically intact Neurovascular intact Dorsiflexion/Plantar flexion intact No cellulitis present Compartment soft Dressing - dressing C/D/I Motor Function - intact, moving foot and toes well on exam.  Hemovac pulled without difficulty.  Past Medical History  Diagnosis Date  . Diabetes mellitus   . Hypercholesteremia   . Colon polyps   . Diverticulitis   . Anxiety   . H/O hiatal hernia   . Hypertension     Dr. Christell Constant  . History of shingles 01-07-13    3'14 recent outbreak- drying in   . Lumbar spondylosis     osteoarthritis-knees  . Hearing loss 01-07-13    bilateral hearing aids    Assessment/Plan: 1 Day Post-Op Procedure(s) (LRB): TOTAL KNEE ARTHROPLASTY (Left) Principal Problem:   OA (osteoarthritis) of knee   Advance  diet Up with therapy D/C IV fluids Plan for discharge tomorrow Discharge home with home health  DVT Prophylaxis - Xarelto Weight-Bearing as tolerated to left leg D/C O2 and Pulse OX and try on Room Air  Kristy [name] V 01/19/2013, 7:26 AM

## 2013-01-19 NOTE — Progress Notes (Signed)
Physical Therapy Treatment Patient Details Name: Kristy [name] MRN: [id number] DOB: 01-Feb-1928 Today's Date: 01/19/2013 Time: 0911-0930 PT Time Calculation (min): 19 min  PT Assessment / Plan / Recommendation Comments on Treatment Session  POD # 1 am session.  Assisted pt out of recliner and amb only 2 feet 2nd MAX c/o pain and inability to functionally amb. Chair brought to pt and will return later after pain meds.     Follow Up Recommendations  Home health PT     Does the patient have the potential to tolerate intense rehabilitation     Barriers to Discharge        Equipment Recommendations  Rolling walker with 5" wheels    Recommendations for Other Services    Frequency 7X/week   Plan      Precautions / Restrictions Precautions Precautions: Knee Required Braces or Orthoses: Knee Immobilizer - Left Knee Immobilizer - Left: Discontinue once straight leg raise with < 10 degree lag Restrictions Weight Bearing Restrictions: No Other Position/Activity Restrictions: WBAT   Pertinent Vitals/Pain C/o 9/10 knee pain Pain meds requested    Mobility  Bed Mobility Bed Mobility: Not assessed Details for Bed Mobility Assistance: Pt OOB in recliner Transfers Transfers: Sit to Stand;Stand to Sit Sit to Stand: 1: +1 Total assist Sit to Stand: Patient Percentage: 60% Stand to Sit: 3: Mod assist Details for Transfer Assistance: 75% VC's on proper tech and hand placement.  very unsteady with X 2 posterior LOB in which pt rerquired extra physical assist.  Ambulation/Gait Ambulation/Gait Assistance: 1: +1 Total assist Ambulation/Gait: Patient Percentage: 60% Ambulation Distance (Feet): 2 Feet Assistive device: Rolling walker Ambulation/Gait Assistance Details: Very unsteady gait requiring 100% VC's on proper walker to self distance and sequencing.  Amb distance limited 2nd MAX c/o pain and difficulty WBing thru L LE.  So chair brought to pt. Gait Pattern: Step-to  pattern;Antalgic;Decreased stance time - left;Narrow base of support Gait velocity: decreased     PT Goals                                                  progressing    Visit Information  Last PT Received On: 01/19/13 Assistance Needed: +2    Subjective Data      Cognition    impaired/delayed (meds?)   Balance   poor  End of Session PT - End of Session Equipment Utilized During Treatment: Left knee immobilizer;Gait belt Activity Tolerance: Patient limited by pain Patient left: in chair;with call bell/phone within reach;with family/visitor present   Felecia Shelling  PTA [facility]  Acute  Rehab Pager      [phone]

## 2013-01-20 LAB — CBC
HCT: 29.5 % — ABNORMAL LOW (ref 36.0–46.0)
Platelets: 130 10*3/uL — ABNORMAL LOW (ref 150–400)
RBC: 3.32 MIL/uL — ABNORMAL LOW (ref 3.87–5.11)
RDW: 14.5 % (ref 11.5–15.5)
WBC: 18.7 10*3/uL — ABNORMAL HIGH (ref 4.0–10.5)

## 2013-01-20 LAB — GLUCOSE, CAPILLARY
Glucose-Capillary: 174 mg/dL — ABNORMAL HIGH (ref 70–99)
Glucose-Capillary: 162 mg/dL — ABNORMAL HIGH (ref 70–99)
Glucose-Capillary: 163 mg/dL — ABNORMAL HIGH (ref 70–99)
Glucose-Capillary: 272 mg/dL — ABNORMAL HIGH (ref 70–99)

## 2013-01-20 LAB — BASIC METABOLIC PANEL
BUN: 10 mg/dL (ref 6–23)
Chloride: 100 mEq/L (ref 96–112)
GFR calc Af Amer: 89 mL/min — ABNORMAL LOW (ref >90)
Potassium: 4.1 mEq/L (ref 3.5–5.1)

## 2013-01-20 MED ORDER — BLISTEX EX OINT
TOPICAL_OINTMENT | CUTANEOUS | Status: AC
  Administered 2013-01-20: 14:00:00
  Filled 2013-01-20: qty 10

## 2013-01-20 MED ORDER — INSULIN ASPART 100 UNIT/ML ~~LOC~~ SOLN
3.0000 [IU] | Freq: Once | SUBCUTANEOUS | Status: AC
  Administered 2013-01-20: 3 [IU] via SUBCUTANEOUS

## 2013-01-20 NOTE — Progress Notes (Signed)
PM PHYSICAL THERAPY session deferred 2nd pt too fatigued after her OT session and now sleeping soundly in bed. Felecia Shelling  PTA [facility]  Acute  Rehab Pager      [phone]

## 2013-01-20 NOTE — Progress Notes (Signed)
Pt's hs CBG was 272. Paged on call PA. Spoke with Ralene Bathe who said to give her three units of insulin per the HS scale protocol. Will continue to monitor.

## 2013-01-20 NOTE — Progress Notes (Signed)
Clinical Social Work Department BRIEF PSYCHOSOCIAL ASSESSMENT 01/20/2013  Patient:  Kristy [name], Kristy [name]     Account Number:  [id number]     Admit date:  01/18/2013  Clinical Social Worker:  Jacelyn Grip  Date/Time:  01/20/2013 11:00 AM  Referred by:  Physician  Date Referred:  01/20/2013 Referred for  SNF Placement   Other Referral:   Interview type:  Patient Other interview type:   and patient son at bedside    PSYCHOSOCIAL DATA Living Status:  HUSBAND Admitted from facility:   Level of care:   Primary support name:  Kathlene November Ayllon/son Primary support relationship to patient:  CHILD, ADULT Degree of support available:   strong    CURRENT CONCERNS Current Concerns  Post-Acute Placement   Other Concerns:    SOCIAL WORK ASSESSMENT / PLAN CSW received referral from MD that pt interested in exploring SNF placement as she has not been progressing with PT as well as she had hoped and is unsure if she will be able to manage at home at discharge.    CSW met with pt and pt son at bedside to discuss. Pt states that she feels it will be beneficial to do short term rehab before returning home. Pt and pt son stated that they are interested in [place] as pt family lives near by.    CSW completed FL2 and initiated SNF search to [facility]. CSW contacted facility to notify of pt interest and facility.    CSW to follow up with pt and pt son re: Lindaann Pascal response in order for pt to make decision about home vs ST SNF.    CSW to continue to follow.   Assessment/plan status:  Psychosocial Support/Ongoing Assessment of Needs Other assessment/ plan:   discharge planning   Information/referral to community resources:   Referral to [facility]    PATIENT'S/FAMILY'S RESPONSE TO PLAN OF CARE: Pt alert and oriented x 4. Pt is considering option of ST SNF and feels that it will likely be most beneficial options at discharge in order to get strong before returning home.        Jacklynn Lewis, MSW, LCSWA  Clinical Social Work [phone]

## 2013-01-20 NOTE — Progress Notes (Signed)
CSW received notification from [facility] and Rehab is able to offer pt a bed and bed will be available tomorrow.   CSW attempted to notify pt at bedside, but pt sleeping soundly at this time.   CSW contacted pt son via telephone as pt had give CSW permission to do so earlier when response was received from [facility]. No answer and CSW left voice message.  CSW to continue to follow and facilitate pt discharge needs when pt medically stable for discharge.  Jacklynn Lewis, MSW, LCSWA  Clinical Social Work [phone]

## 2013-01-20 NOTE — Evaluation (Signed)
Occupational Therapy Evaluation Patient Details Name: Kristy [name] MRN: [id number] DOB: 1928/09/22 Today's Date: 01/20/2013 Time: 4098-1191 OT Time Calculation (min): 27 min  OT Assessment / Plan / Recommendation Clinical Impression  Pt is s/p L TKA and displays decreased safety, functional mobility and ADL. Will benefit from skilled OT services to improve ADL independence.     OT Assessment  Patient needs continued OT Services    Follow Up Recommendations  SNF;Supervision/Assistance - 24 hour    Barriers to Discharge      Equipment Recommendations  None recommended by OT    Recommendations for Other Services    Frequency  Min 2X/week    Precautions / Restrictions Precautions Precautions: Knee;Fall Required Braces or Orthoses: Knee Immobilizer - Left Knee Immobilizer - Left: Discontinue once straight leg raise with < 10 degree lag Restrictions Weight Bearing Restrictions: No Other Position/Activity Restrictions: WBAT        ADL  Eating/Feeding: Simulated;Independent Where Assessed - Eating/Feeding: Chair Grooming: Simulated;Wash/dry hands;Set up Where Assessed - Grooming: Supported sitting Upper Body Bathing: Simulated;Chest;Right arm;Left arm;Abdomen;Set up;Supervision/safety Where Assessed - Upper Body Bathing: Supported sitting Lower Body Bathing: +2 Total assistance Lower Body Bathing: Patient Percentage: 40% Where Assessed - Lower Body Bathing: Supported sit to stand Upper Body Dressing: Simulated;Set up;Supervision/safety Where Assessed - Upper Body Dressing: Supported sitting Lower Body Dressing: Simulated;+2 Total assistance Lower Body Dressing: Patient Percentage: 30% Where Assessed - Lower Body Dressing: Supported sit to stand Toilet Transfer: Performed;+2 Total assistance Toilet Transfer: Patient Percentage: 50% Statistician Method: Surveyor, minerals: Materials engineer and Hygiene: Performed;+2  Total assistance Toileting - Architect and Hygiene: Patient Percentage: 50% Where Assessed - Engineer, mining and Hygiene: Sit to stand from 3-in-1 or toilet Equipment Used: Rolling walker ADL Comments: Pt initially wanting to d/c home but per unit coordinator and pt, now planning for SNF. Pt is very unsteady with functional transfers and standing activity especially to let go of RW with one hand and try to pull up underwear. She tends to not keep the walker close enough to her and needs multimodal cues for hand placement and RW safety. She has much difficulty taking steps backward to step to commode and bed. Needs cues and manual assist for standing more erect as she tends to lean posteriorly during transfers at times. She tries to hold RW improperly and needs cues to hold to handles.     OT Diagnosis: Generalized weakness  OT Problem List: Decreased strength;Decreased knowledge of use of DME or AE OT Treatment Interventions: Self-care/ADL training;Therapeutic activities;DME and/or AE instruction;Patient/family education   OT Goals Acute Rehab OT Goals OT Goal Formulation: With patient Time For Goal Achievement: 01/27/13 Potential to Achieve Goals: Good ADL Goals Pt Will Perform Grooming: with min assist;Standing at sink ADL Goal: Grooming - Progress: Goal set today Pt Will Transfer to Toilet: Ambulation;with min assist;3-in-1 ADL Goal: Toilet Transfer - Progress: Goal set today Pt Will Perform Toileting - Clothing Manipulation: with min assist;Standing ADL Goal: Toileting - Clothing Manipulation - Progress: Goal set today  Visit Information  Last OT Received On: 01/20/13 Assistance Needed: +2    Subjective Data  Subjective: I have been up awhile Patient Stated Goal: none stated   Prior Functioning               Vision/Perception     Cognition  Cognition Arousal/Alertness: Awake/alert Behavior During Therapy:  (needs cues for safety)     Extremity/Trunk Assessment Right  Upper Extremity Assessment RUE ROM/Strength/Tone: [facility] for tasks assessed Left Upper Extremity Assessment LUE ROM/Strength/Tone: [facility] for tasks assessed     Mobility Bed Mobility Bed Mobility: Supine to Sit;Sitting - Scoot to Edge of Bed Supine to Sit: 3: Mod assist;HOB elevated Sitting - Scoot to Edge of Bed: 4: Min assist Sit to Supine: HOB elevated;With rail;3: Mod assist Transfers Transfers: Sit to Stand;Stand to Sit Sit to Stand: 1: +2 Total assist Sit to Stand: Patient Percentage: 60% Stand to Sit: 1: +2 Total assist Stand to Sit: Patient Percentage: 60% Details for Transfer Assistance: multimodal cues for safety, hand placement and assist to extend L LE before sitting.     Exercise Total Joint Exercises Ankle Circles/Pumps: AROM;Both;10 reps;Seated Quad Sets: AROM;Both;10 reps;Seated Towel Squeeze: AROM;Both;10 reps;Seated Short Arc Quad: AAROM;Left;10 reps;Supine Heel Slides: AAROM;Left;10 reps;Supine Hip ABduction/ADduction: AAROM;Left;10 reps;Supine Straight Leg Raises: AAROM;Left;10 reps;Supine   Balance     End of Session OT - End of Session Equipment Utilized During Treatment: Gait belt Activity Tolerance: Patient tolerated treatment well Patient left: in bed;with call bell/phone within reach CPM Left Knee CPM Left Knee: Off  GO     Lennox Laity [phone] 01/20/2013, 2:08 PM

## 2013-01-20 NOTE — Discharge Summary (Signed)
Physician Discharge Summary   Patient ID: Kristy [name] MRN: [id number] DOB/AGE: 02-Apr-1928 79 y.o.  Admit date: 01/18/2013 Discharge date: 01/21/2013  Primary Diagnosis: Osteoarthritis, left knee  Admission Diagnoses:  Past Medical History  Diagnosis Date  . Diabetes mellitus   . Hypercholesteremia   . Colon polyps   . Diverticulitis   . Anxiety   . H/O hiatal hernia   . Hypertension     Dr. Christell Constant  . History of shingles 01-07-13    3'14 recent outbreak- drying in   . Lumbar spondylosis     osteoarthritis-knees  . Hearing loss 01-07-13    bilateral hearing aids   Discharge Diagnoses:   Principal Problem:   OA (osteoarthritis) of knee  Estimated body mass index is 29.59 kg/(m^2) as calculated from the following:   Height as of this encounter: 5\' 3"  (1.6 m).   Weight as of this encounter: 75.751 kg (167 lb).  Procedure:  Procedure(s) (LRB): TOTAL KNEE ARTHROPLASTY (Left)   Consults: None  HPI: The patient is a 79 year old female who comes in for a preoperative History and Physical. The patient is scheduled for a left total knee arthroplasty to be performed by Dr. Gus Rankin. Aluisio, MD at [facility] on 01/18/2013.  The patient is a 79 year old female who presents for follow up of their knee. The patient is being followed for their left knee pain and osteoarthritis.  The knee was getting progressively worse. The last cortisone injection felt great for about a month but then the pain came back as bad as ever. She is not having swelling. She is not having lower extremity weakness or paresthesia with this. The knee is limiting what she can and cannot do. They have been treated conservatively in the past for the above stated problem and despite conservative measures, they continue to have progressive pain and severe functional limitations and dysfunction. They have failed non-operative management including home exercise, medications, and injections. It was felt that they  would benefit from undergoing total joint replacement. There are no active contraindications to surgery such as ongoing infection or rapidly progressive neurological disease.     Laboratory Data: Admission on 01/18/2013  Component Date Value Range Status  . ABO/RH(D) 01/18/2013 A POS   Final  . Antibody Screen 01/18/2013 NEG   Final  . Sample Expiration 01/18/2013 01/21/2013   Final  . Glucose-Capillary 01/18/2013 173* 70 - 99 mg/dL Final  . ABO/RH(D) 40/98/1191 A POS   Final  . Glucose-Capillary 01/18/2013 153* 70 - 99 mg/dL Final  . Comment 1 47/82/9562 Documented in Chart   Final  . WBC 01/19/2013 13.7* 4.0 - 10.5 K/uL Final  . RBC 01/19/2013 3.28* 3.87 - 5.11 MIL/uL Final  . Hemoglobin 01/19/2013 10.2* 12.0 - 15.0 g/dL Final  . HCT 13/04/6577 29.4* 36.0 - 46.0 % Final  . MCV 01/19/2013 89.6  78.0 - 100.0 fL Final  . MCH 01/19/2013 31.1  26.0 - 34.0 pg Final  . MCHC 01/19/2013 34.7  30.0 - 36.0 g/dL Final  . RDW 46/96/2952 14.7  11.5 - 15.5 % Final  . Platelets 01/19/2013 143* 150 - 400 K/uL Final  . Sodium 01/19/2013 134* 135 - 145 mEq/L Final  . Potassium 01/19/2013 4.1  3.5 - 5.1 mEq/L Final  . Chloride 01/19/2013 103  96 - 112 mEq/L Final  . CO2 01/19/2013 24  19 - 32 mEq/L Final  . Glucose, Bld 01/19/2013 157* 70 - 99 mg/dL Final  .  BUN 01/19/2013 12  6 - 23 mg/dL Final  . Creatinine, Ser 01/19/2013 0.79  0.50 - 1.10 mg/dL Final  . Calcium 47/82/9562 8.9  8.4 - 10.5 mg/dL Final  . GFR calc non Af Amer 01/19/2013 75* >90 mL/min Final  . GFR calc Af Amer 01/19/2013 87* >90 mL/min Final   Comment:                                 The eGFR has been calculated                          using the CKD EPI equation.                          This calculation has not been                          validated in all clinical                          situations.                          eGFR's persistently                          <90 mL/min signify                          possible  Chronic Kidney Disease.  . Glucose-Capillary 01/18/2013 227* 70 - 99 mg/dL Final  . Glucose-Capillary 01/18/2013 122* 70 - 99 mg/dL Final  . Glucose-Capillary 01/19/2013 144* 70 - 99 mg/dL Final  . Glucose-Capillary 01/19/2013 194* 70 - 99 mg/dL Final  . WBC 13/04/6577 18.7* 4.0 - 10.5 K/uL Final  . RBC 01/20/2013 3.32* 3.87 - 5.11 MIL/uL Final  . Hemoglobin 01/20/2013 10.2* 12.0 - 15.0 g/dL Final  . HCT 46/96/2952 29.5* 36.0 - 46.0 % Final  . MCV 01/20/2013 88.9  78.0 - 100.0 fL Final  . MCH 01/20/2013 30.7  26.0 - 34.0 pg Final  . MCHC 01/20/2013 34.6  30.0 - 36.0 g/dL Final  . RDW 84/13/2440 14.5  11.5 - 15.5 % Final  . Platelets 01/20/2013 130* 150 - 400 K/uL Final  . Sodium 01/20/2013 132* 135 - 145 mEq/L Final  . Potassium 01/20/2013 4.1  3.5 - 5.1 mEq/L Final  . Chloride 01/20/2013 100  96 - 112 mEq/L Final  . CO2 01/20/2013 23  19 - 32 mEq/L Final  . Glucose, Bld 01/20/2013 196* 70 - 99 mg/dL Final  . BUN 07/27/2535 10  6 - 23 mg/dL Final  . Creatinine, Ser 01/20/2013 0.76  0.50 - 1.10 mg/dL Final  . Calcium 64/40/3474 9.2  8.4 - 10.5 mg/dL Final  . GFR calc non Af Amer 01/20/2013 76* >90 mL/min Final  . GFR calc Af Amer 01/20/2013 89* >90 mL/min Final   Comment:                                 The eGFR has been calculated  using the CKD EPI equation.                          This calculation has not been                          validated in all clinical                          situations.                          eGFR's persistently                          <90 mL/min signify                          possible Chronic Kidney Disease.  . Glucose-Capillary 01/19/2013 189* 70 - 99 mg/dL Final  . Comment 1 04/54/0981 Notify RN   Final  . Glucose-Capillary 01/20/2013 174* 70 - 99 mg/dL Final  . Comment 1 19/14/7829 Notify RN   Final  . Glucose-Capillary 01/20/2013 162* 70 - 99 mg/dL Final  . Glucose-Capillary 01/20/2013 163* 70 - 99 mg/dL Final  .  Comment 1 56/21/3086 Notify RN   Final  . Glucose-Capillary 01/20/2013 272* 70 - 99 mg/dL Final  . Comment 1 57/84/6962 Notify RN   Final  . Comment 2 01/20/2013 Documented in Chart   Final  Hospital Outpatient Visit on 01/07/2013  Component Date Value Range Status  . MRSA, PCR 01/07/2013 NEGATIVE  NEGATIVE Final  . Staphylococcus aureus 01/07/2013 NEGATIVE  NEGATIVE Final   Comment:                                 The Xpert SA Assay (FDA                          approved for NASAL specimens                          in patients over 62 years of age),                          is one component of                          a comprehensive surveillance                          program.  Test performance has                          been validated by [employer] for patients greater                          than or equal to 51 year old.  It is not intended                          to diagnose infection nor to                          guide or monitor treatment.  Marland Kitchen aPTT 01/07/2013 27  24 - 37 seconds Final  . WBC 01/07/2013 12.3* 4.0 - 10.5 K/uL Final  . RBC 01/07/2013 4.03  3.87 - 5.11 MIL/uL Final  . Hemoglobin 01/07/2013 12.6  12.0 - 15.0 g/dL Final  . HCT 09/81/1914 36.5  36.0 - 46.0 % Final  . MCV 01/07/2013 90.6  78.0 - 100.0 fL Final  . MCH 01/07/2013 31.3  26.0 - 34.0 pg Final  . MCHC 01/07/2013 34.5  30.0 - 36.0 g/dL Final  . RDW 78/29/5621 15.1  11.5 - 15.5 % Final  . Platelets 01/07/2013 191  150 - 400 K/uL Final  . Sodium 01/07/2013 140  135 - 145 mEq/L Final  . Potassium 01/07/2013 4.7  3.5 - 5.1 mEq/L Final  . Chloride 01/07/2013 102  96 - 112 mEq/L Final  . CO2 01/07/2013 28  19 - 32 mEq/L Final  . Glucose, Bld 01/07/2013 319* 70 - 99 mg/dL Final  . BUN 30/86/5784 14  6 - 23 mg/dL Final  . Creatinine, Ser 01/07/2013 0.90  0.50 - 1.10 mg/dL Final  . Calcium 69/62/9528 9.4  8.4 - 10.5 mg/dL Final  . Total Protein 01/07/2013  6.4  6.0 - 8.3 g/dL Final  . Albumin 41/32/4401 3.4* 3.5 - 5.2 g/dL Final  . AST 02/72/5366 17  0 - 37 U/L Final  . ALT 01/07/2013 12  0 - 35 U/L Final  . Alkaline Phosphatase 01/07/2013 40  39 - 117 U/L Final  . Total Bilirubin 01/07/2013 0.3  0.3 - 1.2 mg/dL Final  . GFR calc non Af Amer 01/07/2013 58* >90 mL/min Final  . GFR calc Af Amer 01/07/2013 67* >90 mL/min Final   Comment:                                 The eGFR has been calculated                          using the CKD EPI equation.                          This calculation has not been                          validated in all clinical                          situations.                          eGFR's persistently                          <90 mL/min signify                          possible Chronic Kidney Disease.  Marland Kitchen Prothrombin Time 01/07/2013 12.6  11.6 -  15.2 seconds Final  . INR 01/07/2013 0.95  0.00 - 1.49 Final  . Color, Urine 01/07/2013 YELLOW  YELLOW Final  . APPearance 01/07/2013 CLEAR  CLEAR Final  . Specific Gravity, Urine 01/07/2013 1.030  1.005 - 1.030 Final  . pH 01/07/2013 5.5  5.0 - 8.0 Final  . Glucose, UA 01/07/2013 >1000* NEGATIVE mg/dL Final  . Hgb urine dipstick 01/07/2013 NEGATIVE  NEGATIVE Final  . Bilirubin Urine 01/07/2013 NEGATIVE  NEGATIVE Final  . Ketones, ur 01/07/2013 NEGATIVE  NEGATIVE mg/dL Final  . Protein, ur 40/98/1191 NEGATIVE  NEGATIVE mg/dL Final  . Urobilinogen, UA 01/07/2013 0.2  0.0 - 1.0 mg/dL Final  . Nitrite 47/82/9562 NEGATIVE  NEGATIVE Final  . Leukocytes, UA 01/07/2013 NEGATIVE  NEGATIVE Final  . Squamous Epithelial / LPF 01/07/2013 FEW* RARE Final  . WBC, UA 01/07/2013 0-2  <3 WBC/hpf Final  . Bacteria, UA 01/07/2013 RARE  RARE Final     X-Rays:No results found.  EKG: Orders placed in visit on 11/04/12  . EKG 12-LEAD     Hospital Course: Kristy [name] is a 79 y.o. who was admitted to [facility]. They were brought to the operating room on 01/18/2013 and  underwent Procedure(s): TOTAL KNEE ARTHROPLASTY.  Patient tolerated the procedure well and was later transferred to the recovery room and then to the orthopaedic floor for postoperative care.  They were given PO and IV analgesics for pain control following their surgery.  They were given 24 hours of postoperative antibiotics of  Anti-infectives   Start     Dose/Rate Route Frequency Ordered Stop   01/18/13 1430  ceFAZolin (ANCEF) IVPB 1 g/50 mL premix     1 g 100 mL/hr over 30 Minutes Intravenous Every 6 hours 01/18/13 1057 01/18/13 2103   01/18/13 0615  ceFAZolin (ANCEF) IVPB 2 g/50 mL premix     2 g 100 mL/hr over 30 Minutes Intravenous On call to O.R. 01/18/13 1308 01/18/13 0815     and started on DVT prophylaxis in the form of Xarelto.   PT and OT were ordered for total joint protocol.  Discharge planning consulted to help with postop disposition and equipment needs.  Patient had a decent night on the evening of surgery.  They started to get up OOB with therapy on day one. Hemovac drain was pulled without difficulty.  Continued to work with therapy into day two.  Dressing was changed on day two and the incision was clean and dry. Due to slow progression with therapy, it was felt that the patient would benefit from SNF placement. Switched from oxycodone to Tramadol for pain control as she had some confusion with oxycodone. By day three, the patient had progressed with therapy.  Incision was healing well.  Patient was seen in rounds and was ready to go to SNF.   Discharge Medications: Prior to Admission medications   Medication Sig Start Date End Date Taking? Authorizing Provider  ALPRAZolam (XANAX) 0.25 MG tablet Take 0.25 mg by mouth 3 (three) times daily as needed for anxiety.    Yes Historical Provider, MD  ezetimibe (ZETIA) 10 MG tablet Take 5 mg by mouth every morning.    Yes Historical Provider, MD  fenofibrate micronized (LOFIBRA) 134 MG capsule Take 134 mg by mouth at bedtime.    Yes  Historical Provider, MD  furosemide (LASIX) 40 MG tablet Take 40 mg by mouth 2 (two) times a week.    Yes Historical Provider, MD  gabapentin (NEURONTIN) 300 MG  capsule Take 1 capsule (300 mg total) by mouth 4 (four) times daily. 01/02/13  Yes Ernestina Penna, MD  glyBURIDE-metformin (GLUCOVANCE) 5-500 MG per tablet Take 1 tablet by mouth 2 (two) times daily with a meal.   Yes Historical Provider, MD  hydroxypropyl methylcellulose (ISOPTO TEARS) 2.5 % ophthalmic solution Place 1 drop into both eyes 2 (two) times daily as needed (dry eyes).   Yes Historical Provider, MD  metoprolol succinate (TOPROL-XL) 25 MG 24 hr tablet Take 25 mg by mouth every morning. 12/15/12  Yes Ernestina Penna, MD  pravastatin (PRAVACHOL) 80 MG tablet Take 80 mg by mouth every morning.    Yes Historical Provider, MD  methocarbamol (ROBAXIN) 500 MG tablet Take 1 tablet (500 mg total) by mouth every 6 (six) hours as needed. 01/19/13   Loanne Drilling, MD  rivaroxaban (XARELTO) 10 MG TABS tablet Take 1 tablet (10 mg total) by mouth daily with breakfast. 01/19/13   Loanne Drilling, MD  traMADol (ULTRAM) 50 MG tablet Take 1-2 tablets (50-100 mg total) by mouth every 6 (six) hours as needed. 01/19/13   Loanne Drilling, MD    Diet: Regular diet Activity:WBAT Follow-up:in 2 weeks Disposition - Skilled nursing facility Discharged Condition: fair    Future Appointments Provider Department Dept Phone   02/01/2013 8:30 AM Ernestina Penna, MD [facility] [phone]   03/23/2013 8:30 AM Mary-Margaret Daphine Deutscher, FNP [facility] [phone]   05/26/2013 9:30 AM Ernestina Penna, MD [facility] [phone]       Medication List    STOP taking these medications       aspirin 81 MG tablet     BLACK COHOSH PO     estradiol 1 MG tablet  Commonly known as:  ESTRACE     FISH OIL CONCENTRATE PO     ibuprofen 200 MG tablet  Commonly known as:  ADVIL,MOTRIN     VITAMIN D  (CHOLECALCIFEROL) PO      TAKE these medications       ALPRAZolam 0.25 MG tablet  Commonly known as:  XANAX  Take 0.25 mg by mouth 3 (three) times daily as needed for anxiety.     ezetimibe 10 MG tablet  Commonly known as:  ZETIA  Take 5 mg by mouth every morning.     fenofibrate micronized 134 MG capsule  Commonly known as:  LOFIBRA  Take 134 mg by mouth at bedtime.     furosemide 40 MG tablet  Commonly known as:  LASIX  Take 40 mg by mouth 2 (two) times a week.     gabapentin 300 MG capsule  Commonly known as:  NEURONTIN  Take 1 capsule (300 mg total) by mouth 4 (four) times daily.     glyBURIDE-metformin 5-500 MG per tablet  Commonly known as:  GLUCOVANCE  Take 1 tablet by mouth 2 (two) times daily with a meal.     hydroxypropyl methylcellulose 2.5 % ophthalmic solution  Commonly known as:  ISOPTO TEARS  Place 1 drop into both eyes 2 (two) times daily as needed (dry eyes).     methocarbamol 500 MG tablet  Commonly known as:  ROBAXIN  Take 1 tablet (500 mg total) by mouth every 6 (six) hours as needed.     metoprolol succinate 25 MG 24 hr tablet  Commonly known as:  TOPROL-XL  Take 25 mg by mouth every morning.     pravastatin 80 MG tablet  Commonly  known as:  PRAVACHOL  Take 80 mg by mouth every morning.     rivaroxaban 10 MG Tabs tablet  Commonly known as:  XARELTO  Take 1 tablet (10 mg total) by mouth daily with breakfast.     traMADol 50 MG tablet  Commonly known as:  ULTRAM  Take 1-2 tablets (50-100 mg total) by mouth every 6 (six) hours as needed.           Follow-up Information   Follow up with Loanne Drilling, MD. Schedule an appointment as soon as possible for a visit on 02/02/2013. (Call [phone] tomorrow to make the appoinment)    Contact information:   [address], [address] [place] [address] [phone]       Signed: Celedonio Savage Marck Mcclenny LAUREN 01/20/2013, 9:59 PM

## 2013-01-20 NOTE — Progress Notes (Signed)
   Subjective: 2 Days Post-Op Procedure(s) (LRB): TOTAL KNEE ARTHROPLASTY (Left) Patient reports pain as mild.   Oxycodone is making her confused. Alert and oriented this morning Plan is to go Home after hospital stay but she is amenable to SNF if she does not progress well with PT today  Objective: Vital signs in last 24 hours: Temp:  [97.9 F (36.6 C)-102.8 F (39.3 C)] 98.6 F (37 C) (04/23 0446) Pulse Rate:  [76-109] 106 (04/23 0446) Resp:  [14-18] 16 (04/23 0446) BP: (126-160)/(49-76) 149/76 mmHg (04/23 0446) SpO2:  [90 %-99 %] 91 % (04/23 0446)  Intake/Output from previous day:  Intake/Output Summary (Last 24 hours) at 01/20/13 0652 Last data filed at 01/20/13 0445  Gross per 24 hour  Intake    240 ml  Output   1725 ml  Net  -1485 ml    Intake/Output this shift: Total I/O In: -  Out: 900 [Urine:900]  Labs:  Recent Labs  01/19/13 0445 01/20/13 0530  HGB 10.2* 10.2*    Recent Labs  01/19/13 0445 01/20/13 0530  WBC 13.7* 18.7*  RBC 3.28* 3.32*  HCT 29.4* 29.5*  PLT 143* 130*    Recent Labs  01/19/13 0445 01/20/13 0530  NA 134* 132*  K 4.1 4.1  CL 103 100  CO2 24 23  BUN 12 10  CREATININE 0.79 0.76  GLUCOSE 157* 196*  CALCIUM 8.9 9.2   No results found for this basename: LABPT, INR,  in the last 72 hours  EXAM General - Patient is Alert, Appropriate and Oriented Extremity - Neurologically intact Neurovascular intact Incision: dressing C/D/I No cellulitis present Compartment soft Dressing/Incision - clean, dry, no drainage Motor Function - intact, moving foot and toes well on exam.   Past Medical History  Diagnosis Date  . Diabetes mellitus   . Hypercholesteremia   . Colon polyps   . Diverticulitis   . Anxiety   . H/O hiatal hernia   . Hypertension     Dr. Christell Constant  . History of shingles 01-07-13    3'14 recent outbreak- drying in   . Lumbar spondylosis     osteoarthritis-knees  . Hearing loss 01-07-13    bilateral hearing aids      Assessment/Plan: 2 Days Post-Op Procedure(s) (LRB): TOTAL KNEE ARTHROPLASTY (Left) Principal Problem:   OA (osteoarthritis) of knee   Advance diet D/C IV fluids Plan for discharge tomorrow to home or SNF depending on progress today. Will D/C Oxycodone since she got confused with it. Will use Tramadol for pain  DVT Prophylaxis - Xarelto Weight-Bearing as tolerated to left leg  Milianna Ericsson V 01/20/2013, 6:52 AM

## 2013-01-20 NOTE — Progress Notes (Signed)
Physical Therapy Treatment Patient Details Name: Kristy [name] MRN: [id number] DOB: 1927/10/02 Today's Date: 01/20/2013 Time: 4098-1191 PT Time Calculation (min): 43 min  PT Assessment / Plan / Recommendation Comments on Treatment Session  POD #2 L TKR. Assisted OOB to ambulate. Pt still unable to complete TR sit to stand by herself and requires constant multi-modal cues for proper RW advancement and technique. She remains very unsteady with her gait.   Pt and son agree to D/C to [facility] as pt is progressing slowly, demon post op confusion (esp @ night)  and cont to be unsteady during gait.  Unit care coordinator notified of D/C change.   Frequency 7X/week   Plan Discharge plan remains appropriate    Precautions / Restrictions Precautions Precautions: Knee;Fall Required Braces or Orthoses: Knee Immobilizer - Left Knee Immobilizer - Left: Discontinue once straight leg raise with < 10 degree lag Restrictions Weight Bearing Restrictions: No Other Position/Activity Restrictions: WBAT   Pertinent Vitals/Pain Pt states that her L LE is ache-y tho she does not rate.    Mobility  Bed Mobility Bed Mobility: Supine to Sit;Sitting - Scoot to Edge of Bed Supine to Sit: 3: Mod assist;HOB elevated Sitting - Scoot to [place] of Bed: 4: Min assist Transfers Transfers: Sit to Stand;Stand to Sit Sit to Stand: 1: +1 Total assist Sit to Stand: Patient Percentage: 50% Stand to Sit: 4: Min assist Details for Transfer Assistance: 75% VC's on proper technique and hand placement. Very unsteady. Pt. unable to initiate and comlete sit to stand by herself. Ambulation/Gait Ambulation/Gait Assistance: 1: +1 Total assist Ambulation/Gait: Patient Percentage: 60% Ambulation Distance (Feet): 75 Feet Assistive device: Rolling walker Ambulation/Gait Assistance Details: Requiring 75% VC's and 50% tactile cueing for proper sequencing and walker advancement. Very unsteady gait; HIGH FALL RISK. Gait Pattern:  Step-to pattern;Antalgic;Decreased stance time - left;Narrow base of support Gait velocity: decreased General Gait Details: Very unsteady gait. Pt's arms fatigue quickly with walker use.    Exercises Total Joint Exercises Ankle Circles/Pumps: AROM;Both;10 reps;Seated Quad Sets: AROM;Both;10 reps;Seated Towel Squeeze: AROM;Both;10 reps;Seated Short Arc Quad: AAROM;Left;10 reps;Supine Heel Slides: AAROM;Left;10 reps;Supine Hip ABduction/ADduction: AAROM;Left;10 reps;Supine Straight Leg Raises: AAROM;Left;10 reps;Supine     PT Goals Acute Rehab PT Goals PT Goal Formulation: With patient/family Time For Goal Achievement: 01/25/13 Potential to Achieve Goals: Good Pt will go Supine/Side to Sit: with supervision PT Goal: Supine/Side to Sit - Progress: Progressing toward goal Pt will go Sit to Stand: with supervision PT Goal: Sit to Stand - Progress: Progressing toward goal Pt will go Stand to Sit: with supervision PT Goal: Stand to Sit - Progress: Progressing toward goal Pt will Ambulate: 51 - 150 feet;with supervision;with rolling walker PT Goal: Ambulate - Progress: Progressing toward goal Pt will Perform Home Exercise Program: with supervision, verbal cues required/provided PT Goal: Perform Home Exercise Program - Progress: Progressing toward goal  Visit Information  Last PT Received On: 01/20/13    Cognition    Fair +   Balance   Fair -  End of Session PT - End of Session Equipment Utilized During Treatment: Left knee immobilizer;Gait belt Activity Tolerance: Patient limited by fatigue Patient left: in chair;with call bell/phone within reach;with family/visitor present CPM Left Knee CPM Left Knee: Off   GP     BROWN-SMEDLEY, NICOLE 01/20/2013, 1:27 PM  Felecia Shelling  PTA [facility]  Acute  Rehab Pager      [phone]

## 2013-01-20 NOTE — Progress Notes (Addendum)
Clinical Social Work Department CLINICAL SOCIAL WORK PLACEMENT NOTE 01/20/2013  Patient:  Kristy [name], Kristy [name]  Account Number:  [id number] Admit date:  01/18/2013  Clinical Social Worker:  Jacelyn Grip  Date/time:  01/20/2013 11:15 AM  Clinical Social Work is seeking post-discharge placement for this patient at the following level of care:   SKILLED NURSING   (*CSW will update this form in Epic as items are completed)   01/20/2013  Patient/family provided with [facility] Department of Clinical Social Work's list of facilities offering this level of care within the geographic area requested by the patient (or if unable, by the patient's family).  01/20/2013  Patient/family informed of their freedom to choose among providers that offer the needed level of care, that participate in Medicare, Medicaid or managed care program needed by the patient, have an available bed and are willing to accept the patient.  01/20/2013  Patient/family informed of [facility]' ownership interest in [facility], as well as of the fact that they are under no obligation to receive care at this facility.  PASARR submitted to EDS on 01/20/2013 PASARR number received from EDS on 01/20/2013  FL2 transmitted to all facilities in geographic area requested by pt/family on  01/20/2013 FL2 transmitted to all facilities within larger geographic area on   Patient informed that his/her managed care company has contracts with or will negotiate with  certain facilities, including the following:     Patient/family informed of bed offers received:  01/20/2013 Patient chooses bed at [facility] Physician recommends and patient chooses bed at    Patient to be transferred to  on  [facility] on 01/21/2013 Patient to be transferred to facility by pt family via private vehicle  The following physician request were entered in Epic:   Additional Comments: Pt interested in [place].   Jacklynn Lewis, MSW,  LCSWA  Clinical Social Work [phone]

## 2013-01-20 NOTE — Care Management Note (Addendum)
    Page 1 of 1   01/21/2013     3:47:54 PM   CARE MANAGEMENT NOTE 01/21/2013  Patient:  Kristy [name], Kristy [name]   Account Number:  [id number]  Date Initiated:  01/20/2013  Documentation initiated by:  Colleen Can  Subjective/Objective Assessment:   dx osteoarthritis left knee; total knee replacemnt     Action/Plan:   SNF rehab   Anticipated DC Date:  01/21/2013   Anticipated DC Plan:  SKILLED NURSING FACILITY  In-house referral  Clinical Social Worker      DC Planning Services  CM consult      Choice offered to / List presented to:             Status of service:  Completed, signed off Medicare Important Message given?   (If response is "NO", the following Medicare IM given date fields will be blank) Date Medicare IM given:   Date Additional Medicare IM given:    Discharge Disposition:  SKILLED NURSING FACILITY  Per UR Regulation:  Reviewed for med. necessity/level of care/duration of stay  If discussed at Long Length of Stay Meetings, dates discussed:    Comments:  01/21/13 Gurpreet Mikhail RN,BSN NCM [phone] D/C SNF.

## 2013-01-21 LAB — CBC
MCH: 30.6 pg (ref 26.0–34.0)
MCHC: 34.6 g/dL (ref 30.0–36.0)
MCV: 88.4 fL (ref 78.0–100.0)
Platelets: 159 10*3/uL (ref 150–400)
RBC: 3.27 MIL/uL — ABNORMAL LOW (ref 3.87–5.11)

## 2013-01-21 LAB — GLUCOSE, CAPILLARY: Glucose-Capillary: 149 mg/dL — ABNORMAL HIGH (ref 70–99)

## 2013-01-21 NOTE — Progress Notes (Signed)
Pt stable, all paperwork sent with pt and family to [facility].  Pt transported via wheelchair to private vehicle with family and NT.  Family will transport pt to [facility].

## 2013-01-21 NOTE — Progress Notes (Signed)
   Subjective: 3 Days Post-Op Procedure(s) (LRB): TOTAL KNEE ARTHROPLASTY (Left) Patient reports pain as mild.   Patient seen in rounds with Kristy [name]. Patient is well, and has had no acute complaints or problems. She is feeling better today, much less confused. She is still concerned that she cannot perform a SLR. No complaints of chest pain or shortness of breath.  Plan is to go Skilled nursing facility after hospital stay.  Objective: Vital signs in last 24 hours: Temp:  [97.6 F (36.4 C)-100.2 F (37.9 C)] 98 F (36.7 C) (04/24 0507) Pulse Rate:  [85-100] 85 (04/24 0507) Resp:  [15-20] 20 (04/24 0507) BP: (146-156)/(73-85) 148/74 mmHg (04/24 0507) SpO2:  [94 %-96 %] 96 % (04/24 0507)  Intake/Output from previous day:  Intake/Output Summary (Last 24 hours) at 01/21/13 0716 Last data filed at 01/21/13 0521  Gross per 24 hour  Intake    600 ml  Output   1350 ml  Net   -750 ml    Intake/Output this shift:    Labs:  Recent Labs  01/19/13 0445 01/20/13 0530 01/21/13 0419  HGB 10.2* 10.2* 10.0*    Recent Labs  01/20/13 0530 01/21/13 0419  WBC 18.7* 18.4*  RBC 3.32* 3.27*  HCT 29.5* 28.9*  PLT 130* 159    Recent Labs  01/19/13 0445 01/20/13 0530  NA 134* 132*  K 4.1 4.1  CL 103 100  CO2 24 23  BUN 12 10  CREATININE 0.79 0.76  GLUCOSE 157* 196*  CALCIUM 8.9 9.2    EXAM General - Patient is Alert and Oriented Extremity - Neurologically intact Dorsiflexion/Plantar flexion intact Dressing/Incision - clean, dry, no drainage Motor Function - intact, moving foot and toes well on exam.   Past Medical History  Diagnosis Date  . Diabetes mellitus   . Hypercholesteremia   . Colon polyps   . Diverticulitis   . Anxiety   . H/O hiatal hernia   . Hypertension     Kristy [name]  . History of shingles 01-07-13    3'14 recent outbreak- drying in   . Lumbar spondylosis     osteoarthritis-knees  . Hearing loss 01-07-13    bilateral hearing aids     Assessment/Plan: 3 Days Post-Op Procedure(s) (LRB): TOTAL KNEE ARTHROPLASTY (Left) Principal Problem:   OA (osteoarthritis) of knee  Estimated body mass index is 29.59 kg/(m^2) as calculated from the following:   Height as of this encounter: 5\' 3"  (1.6 m).   Weight as of this encounter: 75.751 kg (167 lb). Advance diet Up with therapy D/C IV fluids Discharge to SNF  DVT Prophylaxis - Xarelto Weight-Bearing as tolerated   She is doing better today. She was reassured that it is normal for her to not be able to perform a SLR yet. Will discharge to SNF today. Will follow up in office in 2 weeks.   Kristy [name] Kristy [name] 01/21/2013, 7:16 AM

## 2013-01-21 NOTE — Progress Notes (Signed)
Pt for discharge to [facility].  CSW facilitated pt discharge needs including contacting facility, faxing pt discharge information via [facility], discussing with pt and pt family at bedside, providing RN phone number to call report. Pt would like to transfer to [facility] via pt family via private vehicle. CSW confirmed with PT that pt would be able to transport via private vehicle.  Discharge packet provided to pt family at bedside to provide to [facility] upon arrival to facility.  No further social work needs identified at this time.  Jacklynn Lewis, MSW, LCSWA  Clinical Social Work [phone]

## 2013-01-21 NOTE — Progress Notes (Signed)
Report called to at Dorene Ar, RN [facility].

## 2013-02-01 ENCOUNTER — Ambulatory Visit: Payer: Medicare Other | Admitting: Family Medicine

## 2013-02-08 ENCOUNTER — Ambulatory Visit: Payer: Medicare Other | Attending: Orthopedic Surgery | Admitting: Physical Therapy

## 2013-02-08 DIAGNOSIS — IMO0001 Reserved for inherently not codable concepts without codable children: Secondary | ICD-10-CM | POA: Insufficient documentation

## 2013-02-08 DIAGNOSIS — M25569 Pain in unspecified knee: Secondary | ICD-10-CM | POA: Insufficient documentation

## 2013-02-08 DIAGNOSIS — Z96659 Presence of unspecified artificial knee joint: Secondary | ICD-10-CM | POA: Insufficient documentation

## 2013-02-08 DIAGNOSIS — R5381 Other malaise: Secondary | ICD-10-CM | POA: Insufficient documentation

## 2013-02-08 DIAGNOSIS — M25669 Stiffness of unspecified knee, not elsewhere classified: Secondary | ICD-10-CM | POA: Insufficient documentation

## 2013-02-09 ENCOUNTER — Ambulatory Visit: Payer: Medicare Other | Admitting: Physical Therapy

## 2013-02-12 ENCOUNTER — Ambulatory Visit: Payer: Medicare Other | Admitting: Physical Therapy

## 2013-02-12 NOTE — Telephone Encounter (Signed)
rx gliouaide/metformin ordered on 01/11/13 per Dr Christell Constant.

## 2013-02-15 ENCOUNTER — Ambulatory Visit: Payer: Medicare Other | Admitting: Physical Therapy

## 2013-02-17 ENCOUNTER — Ambulatory Visit: Payer: Medicare Other | Admitting: Physical Therapy

## 2013-02-18 ENCOUNTER — Ambulatory Visit: Payer: Medicare Other | Admitting: Physical Therapy

## 2013-02-23 ENCOUNTER — Encounter: Payer: Medicare Other | Admitting: Physical Therapy

## 2013-02-26 ENCOUNTER — Ambulatory Visit: Payer: Medicare Other | Admitting: Physical Therapy

## 2013-03-01 ENCOUNTER — Encounter: Payer: Medicare Other | Admitting: *Deleted

## 2013-03-03 ENCOUNTER — Ambulatory Visit: Payer: Medicare Other | Attending: Orthopedic Surgery | Admitting: Physical Therapy

## 2013-03-03 DIAGNOSIS — M25569 Pain in unspecified knee: Secondary | ICD-10-CM | POA: Insufficient documentation

## 2013-03-03 DIAGNOSIS — R5381 Other malaise: Secondary | ICD-10-CM | POA: Insufficient documentation

## 2013-03-03 DIAGNOSIS — Z96659 Presence of unspecified artificial knee joint: Secondary | ICD-10-CM | POA: Insufficient documentation

## 2013-03-03 DIAGNOSIS — M25669 Stiffness of unspecified knee, not elsewhere classified: Secondary | ICD-10-CM | POA: Insufficient documentation

## 2013-03-03 DIAGNOSIS — IMO0001 Reserved for inherently not codable concepts without codable children: Secondary | ICD-10-CM | POA: Insufficient documentation

## 2013-03-10 ENCOUNTER — Ambulatory Visit: Payer: Medicare Other | Admitting: Physical Therapy

## 2013-03-12 ENCOUNTER — Encounter: Payer: Self-pay | Admitting: General Practice

## 2013-03-12 ENCOUNTER — Ambulatory Visit (INDEPENDENT_AMBULATORY_CARE_PROVIDER_SITE_OTHER): Payer: Medicare Other | Admitting: General Practice

## 2013-03-12 VITALS — BP 131/66 | HR 69 | Temp 97.5°F | Ht 63.0 in | Wt 165.0 lb

## 2013-03-12 DIAGNOSIS — R350 Frequency of micturition: Secondary | ICD-10-CM

## 2013-03-12 DIAGNOSIS — N760 Acute vaginitis: Secondary | ICD-10-CM

## 2013-03-12 DIAGNOSIS — B373 Candidiasis of vulva and vagina: Secondary | ICD-10-CM

## 2013-03-12 LAB — POCT URINALYSIS DIPSTICK
Bilirubin, UA: NEGATIVE
Blood, UA: NEGATIVE
Glucose, UA: NEGATIVE
Spec Grav, UA: 1.02
pH, UA: 5

## 2013-03-12 LAB — POCT UA - MICROSCOPIC ONLY
WBC, Ur, HPF, POC: NEGATIVE
Yeast, UA: NEGATIVE

## 2013-03-12 MED ORDER — FLUCONAZOLE 150 MG PO TABS: 150.0000 mg | ORAL_TABLET | Freq: Once | ORAL | Status: DC

## 2013-03-12 NOTE — Progress Notes (Signed)
  Subjective:    Patient ID: Kristy [name], female    DOB: 1928/08/14, 79 y.o.   MRN: [id number]  HPI Presents today with painful urination. Reports onset was Monday, then it clear up and returned on yesterday. Reports vaginal area feels raw. Reports stinging feeling as urine makes contact with skin. Denies discharge or foul odor. Denies OTC medication being used.     Review of Systems  Constitutional: Negative for fever and chills.  Respiratory: Negative for chest tightness and shortness of breath.   Cardiovascular: Negative for chest pain and palpitations.  Genitourinary: Negative for difficulty urinating.  Skin:       Vaginal area redness  Neurological: Negative for dizziness, weakness, numbness and headaches.       Objective:   Physical Exam  Constitutional: She is oriented to person, place, and time. She appears well-developed and well-nourished.  HENT:  Head: Normocephalic and atraumatic.  Cardiovascular: Normal rate and regular rhythm.   Pulmonary/Chest: Effort normal and breath sounds normal. No respiratory distress. She exhibits no tenderness.  Neurological: She is alert and oriented to person, place, and time.  Skin: Skin is warm and dry.  Psychiatric: She has a normal mood and affect.          Assessment & Plan:  1. Frequency of urination - POCT UA - Microscopic Only - POCT urinalysis dipstick  2. Vaginitis and vulvovaginitis and 3. Vaginal candidiasis -diflucan 150mg  tablet once, then may repeat in three days if symptoms persist -discussed proper perineal hygiene -apply monistat cream as directed -RTO if symptoms worsen or unresolved -Patient verbalized understanding -Coralie Keens, FNP-C

## 2013-03-12 NOTE — Patient Instructions (Signed)
Candidal Vulvovaginitis  Candidal vulvovaginitis is an infection of the vagina and vulva. The vulva is the skin around the opening of the vagina. This may cause itching and discomfort in and around the vagina.   HOME CARE  · Only take medicine as told by your doctor.  · Do not have sex (intercourse) until the infection is healed or as told by your doctor.  · Practice safe sex.  · Tell your sex partner about your infection.  · Do not douche or use tampons.  · Wear cotton underwear. Do not wear tight pants or panty hose.  · Eat yogurt. This may help treat and prevent yeast infections.  GET HELP RIGHT AWAY IF:   · You have a fever.  · Your problems get worse during treatment or do not get better in 3 days.  · You have discomfort, irritation, or itching in your vagina or vulva area.  · You have pain after sex.  · You start to get belly (abdominal) pain.  MAKE SURE YOU:  · Understand these instructions.  · Will watch your condition.  · Will get help right away if you are not doing well or get worse.  Document Released: 12/13/2008 Document Revised: 12/09/2011 Document Reviewed: 12/13/2008  ExitCare® Patient Information ©2014 [employer].

## 2013-03-17 ENCOUNTER — Encounter: Payer: Medicare Other | Admitting: Physical Therapy

## 2013-03-19 ENCOUNTER — Other Ambulatory Visit: Payer: Self-pay

## 2013-03-19 DIAGNOSIS — C9110 Chronic lymphocytic leukemia of B-cell type not having achieved remission: Secondary | ICD-10-CM

## 2013-03-19 MED ORDER — ALPRAZOLAM 0.25 MG PO TABS: 0.2500 mg | ORAL_TABLET | Freq: Three times a day (TID) | ORAL | Status: DC | PRN

## 2013-03-19 NOTE — Telephone Encounter (Signed)
Last seen 03/12/13

## 2013-03-23 ENCOUNTER — Other Ambulatory Visit: Payer: Self-pay | Admitting: Family Medicine

## 2013-03-23 ENCOUNTER — Other Ambulatory Visit: Payer: Self-pay | Admitting: Nurse Practitioner

## 2013-04-15 ENCOUNTER — Other Ambulatory Visit: Payer: Self-pay

## 2013-04-15 MED ORDER — GLUCOSE BLOOD VI STRP: ORAL_STRIP | Status: DC

## 2013-04-15 MED ORDER — ADVOCATE LANCETS MISC: 1.0000 | Freq: Two times a day (BID) | Status: DC

## 2013-04-20 ENCOUNTER — Other Ambulatory Visit: Payer: Self-pay | Admitting: Family Medicine

## 2013-04-26 ENCOUNTER — Other Ambulatory Visit: Payer: Self-pay

## 2013-04-26 MED ORDER — FLUCONAZOLE 150 MG PO TABS: 150.0000 mg | ORAL_TABLET | Freq: Once | ORAL | Status: DC

## 2013-04-26 NOTE — Telephone Encounter (Signed)
Last seen 03/12/13  MAE  Last filled 03/12/13 1 RF

## 2013-05-18 ENCOUNTER — Other Ambulatory Visit: Payer: Self-pay

## 2013-05-18 NOTE — Telephone Encounter (Signed)
Last seen 03/12/13  Kristy [name]  Last glucose done 01/21/13

## 2013-05-19 MED ORDER — ADVOCATE LANCETS MISC: 1.0000 | Freq: Two times a day (BID) | Status: DC

## 2013-05-19 MED ORDER — GLUCOSE BLOOD VI STRP: ORAL_STRIP | Status: DC

## 2013-05-24 ENCOUNTER — Other Ambulatory Visit (INDEPENDENT_AMBULATORY_CARE_PROVIDER_SITE_OTHER): Payer: Medicare Other

## 2013-05-24 DIAGNOSIS — I10 Essential (primary) hypertension: Secondary | ICD-10-CM

## 2013-05-24 DIAGNOSIS — E785 Hyperlipidemia, unspecified: Secondary | ICD-10-CM

## 2013-05-24 DIAGNOSIS — E559 Vitamin D deficiency, unspecified: Secondary | ICD-10-CM

## 2013-05-24 DIAGNOSIS — R5381 Other malaise: Secondary | ICD-10-CM

## 2013-05-24 LAB — POCT CBC
Granulocyte percent: 49.4 %G (ref 37–80)
Hemoglobin: 12.5 g/dL (ref 12.2–16.2)
Lymph, poc: 5.4 — AB (ref 0.6–3.4)
MPV: 10 fL (ref 0–99.8)
POC Granulocyte: 5.7 (ref 2–6.9)
POC LYMPH PERCENT: 46.6 %L (ref 10–50)
Platelet Count, POC: 166 10*3/uL (ref 142–424)
RBC: 4.2 M/uL (ref 4.04–5.48)

## 2013-05-24 NOTE — Progress Notes (Signed)
Patient came in for labs only.

## 2013-05-26 ENCOUNTER — Encounter: Payer: Self-pay | Admitting: *Deleted

## 2013-05-26 ENCOUNTER — Ambulatory Visit: Payer: Self-pay | Admitting: Family Medicine

## 2013-05-26 LAB — BMP8+EGFR
BUN: 21 mg/dL (ref 8–27)
CO2: 22 mmol/L (ref 18–29)
Calcium: 10.3 mg/dL — ABNORMAL HIGH (ref 8.6–10.2)
Creatinine, Ser: 0.94 mg/dL (ref 0.57–1.00)
GFR calc non Af Amer: 57 mL/min/{1.73_m2} — ABNORMAL LOW (ref >59)
Glucose: 134 mg/dL — ABNORMAL HIGH (ref 65–99)
Sodium: 139 mmol/L (ref 134–144)

## 2013-05-26 LAB — HEPATIC FUNCTION PANEL
ALT: 11 IU/L (ref 0–32)
Albumin: 4.1 g/dL (ref 3.5–4.7)
Bilirubin, Direct: 0.13 mg/dL (ref 0.00–0.40)
Total Bilirubin: 0.2 mg/dL (ref 0.0–1.2)
Total Protein: 6 g/dL (ref 6.0–8.5)

## 2013-05-26 LAB — NMR, LIPOPROFILE
Cholesterol: 125 mg/dL (ref <200)
HDL Cholesterol by NMR: 62 mg/dL (ref >=40)
Small LDL Particle Number: 388 nmol/L (ref <=527)
Triglycerides by NMR: 110 mg/dL (ref <150)

## 2013-05-27 ENCOUNTER — Other Ambulatory Visit: Payer: Self-pay | Admitting: Family Medicine

## 2013-05-27 ENCOUNTER — Ambulatory Visit: Payer: Self-pay | Admitting: Family Medicine

## 2013-06-08 ENCOUNTER — Encounter: Payer: Self-pay | Admitting: Family Medicine

## 2013-06-08 ENCOUNTER — Ambulatory Visit (INDEPENDENT_AMBULATORY_CARE_PROVIDER_SITE_OTHER): Payer: Medicare Other | Admitting: Family Medicine

## 2013-06-08 VITALS — BP 115/67 | HR 70 | Temp 99.1°F | Ht 63.0 in | Wt 164.0 lb

## 2013-06-08 DIAGNOSIS — E559 Vitamin D deficiency, unspecified: Secondary | ICD-10-CM

## 2013-06-08 DIAGNOSIS — C9110 Chronic lymphocytic leukemia of B-cell type not having achieved remission: Secondary | ICD-10-CM

## 2013-06-08 DIAGNOSIS — E785 Hyperlipidemia, unspecified: Secondary | ICD-10-CM

## 2013-06-08 DIAGNOSIS — E119 Type 2 diabetes mellitus without complications: Secondary | ICD-10-CM

## 2013-06-08 DIAGNOSIS — I10 Essential (primary) hypertension: Secondary | ICD-10-CM | POA: Insufficient documentation

## 2013-06-08 DIAGNOSIS — E1159 Type 2 diabetes mellitus with other circulatory complications: Secondary | ICD-10-CM | POA: Insufficient documentation

## 2013-06-08 DIAGNOSIS — E1169 Type 2 diabetes mellitus with other specified complication: Secondary | ICD-10-CM | POA: Insufficient documentation

## 2013-06-08 LAB — POCT UA - MICROALBUMIN: Microalbumin Ur, POC: POSITIVE mg/L

## 2013-06-08 NOTE — Patient Instructions (Signed)
Continue aggressive therapeutic lifestyle changes Continue home physical therapy with exercising lower extremity Don't forget to get flu shot in October Continue current medications Don't put herself at risk for falls

## 2013-06-08 NOTE — Progress Notes (Signed)
  Subjective:    Patient ID: Kristy [name], female    DOB: September 10, 1928, 79 y.o.   MRN: [id number]  HPI Patient comes in today for followup of chronic medical problems and their management. She had left knee replacement in April of the orthopedist. She spent some time and recovery at the nursing home before coming home. The problems today include diabetes mellitus, hyperlipidemia, hypertension, osteoarthritis, and anxiety. Average morning blood sugars are around 120 to 140. Lab work was reviewed with patient.   Review of Systems  HENT: Negative.   Eyes: Negative.   Respiratory: Negative.   Cardiovascular: Negative.   Gastrointestinal: Negative.   Endocrine: Negative.   Genitourinary: Negative.   Musculoskeletal: Negative.   Skin: Positive for wound (check place on left ear and lip ).  Allergic/Immunologic: Negative.   Neurological: Positive for weakness (legs and back).  Hematological: Negative.   Psychiatric/Behavioral: Negative.        Objective:   Physical Exam BP 115/67  Pulse 70  Temp(Src) 99.1 F (37.3 C) (Oral)  Ht 5\' 3"  (1.6 m)  Wt 164 lb (74.39 kg)  BMI 29.06 kg/m2  The patient appeared well nourished and normally developed, alert and oriented to time and place. Speech, behavior and judgement appear normal. Vital signs as documented.  Head exam is unremarkable. No scleral icterus or pallor noted. Ears nose and throat were normal. There is a small skin tag on the left superior ear. Her lip looked fine and there was no sign of any blackhead or, and on the lower lip.  Neck is without jugular venous distension, thyromegally, or carotid bruits. Carotid upstrokes are brisk bilaterally. No cervical adenopathy. Lungs are clear anteriorly and posteriorly to auscultation. Normal respiratory effort. Cardiac exam reveals regular rate and rhythm at 72 per minute. First and second heart sounds normal.  No murmurs, rubs or gallops.  Abdominal exam reveals obesity, normal bowl  sounds, no masses, no organomegaly and no aortic enlargement. No inguinal adenopathy. Extremities are nonedematous and both femoral and pedal pulses are normal. Skin without pallor or jaundice.  Warm and dry, without rash. Neurologic exam reveals normal deep tendon reflexes and normal sensation. Diabetic foot exam was done.          Assessment & Plan:  1. Diabetes mellitus type 2 controlled - POCT UA - Microalbumin - POCT glycosylated hemoglobin (Hb A1C)  2. Hypertension  3. Hyperlipidemia  4. Vitamin D deficiency -Increase vitamin D3 to 1000 Monday through Friday and 2000 on Saturday and Sunday  5. CLL (chronic lymphocytic leukemia) -Continue to monitor this  Patient Instructions  Continue aggressive therapeutic lifestyle changes Continue home physical therapy with exercising lower extremity Don't forget to get flu shot in October Continue current medications Don't put herself at risk for falls   Nyra Capes MD

## 2013-06-08 NOTE — Addendum Note (Signed)
Addended by: Orma Render F on: 06/08/2013 04:33 PM   Modules accepted: Orders

## 2013-06-09 ENCOUNTER — Other Ambulatory Visit: Payer: Self-pay | Admitting: Nurse Practitioner

## 2013-06-10 ENCOUNTER — Other Ambulatory Visit: Payer: Self-pay | Admitting: *Deleted

## 2013-06-10 MED ORDER — LISINOPRIL 10 MG PO TABS: 5.0000 mg | ORAL_TABLET | Freq: Every day | ORAL | Status: DC

## 2013-06-15 ENCOUNTER — Other Ambulatory Visit: Payer: Self-pay | Admitting: Nurse Practitioner

## 2013-07-06 ENCOUNTER — Ambulatory Visit (INDEPENDENT_AMBULATORY_CARE_PROVIDER_SITE_OTHER): Payer: Medicare Other | Admitting: *Deleted

## 2013-07-06 DIAGNOSIS — Z23 Encounter for immunization: Secondary | ICD-10-CM

## 2013-07-18 ENCOUNTER — Other Ambulatory Visit: Payer: Self-pay | Admitting: Family Medicine

## 2013-07-29 ENCOUNTER — Other Ambulatory Visit: Payer: Self-pay | Admitting: Family Medicine

## 2013-08-22 ENCOUNTER — Other Ambulatory Visit: Payer: Self-pay | Admitting: Family Medicine

## 2013-08-23 ENCOUNTER — Encounter: Payer: Self-pay | Admitting: *Deleted

## 2013-08-24 ENCOUNTER — Ambulatory Visit (INDEPENDENT_AMBULATORY_CARE_PROVIDER_SITE_OTHER): Payer: Medicare Other | Admitting: Family Medicine

## 2013-08-24 VITALS — BP 121/69 | HR 87 | Temp 96.6°F | Ht 63.0 in | Wt 158.0 lb

## 2013-08-24 DIAGNOSIS — F32A Depression, unspecified: Secondary | ICD-10-CM | POA: Insufficient documentation

## 2013-08-24 DIAGNOSIS — J218 Acute bronchiolitis due to other specified organisms: Secondary | ICD-10-CM

## 2013-08-24 DIAGNOSIS — B373 Candidiasis of vulva and vagina: Secondary | ICD-10-CM

## 2013-08-24 DIAGNOSIS — E119 Type 2 diabetes mellitus without complications: Secondary | ICD-10-CM

## 2013-08-24 DIAGNOSIS — L293 Anogenital pruritus, unspecified: Secondary | ICD-10-CM

## 2013-08-24 DIAGNOSIS — F329 Major depressive disorder, single episode, unspecified: Secondary | ICD-10-CM

## 2013-08-24 DIAGNOSIS — N898 Other specified noninflammatory disorders of vagina: Secondary | ICD-10-CM

## 2013-08-24 LAB — POCT UA - MICROSCOPIC ONLY
Bacteria, U Microscopic: NEGATIVE
Crystals, Ur, HPF, POC: NEGATIVE
Mucus, UA: NEGATIVE
RBC, urine, microscopic: NEGATIVE

## 2013-08-24 LAB — POCT WET PREP (WET MOUNT)

## 2013-08-24 LAB — POCT URINALYSIS DIPSTICK
Bilirubin, UA: NEGATIVE
Blood, UA: NEGATIVE
Leukocytes, UA: NEGATIVE
Nitrite, UA: NEGATIVE
pH, UA: 5

## 2013-08-24 MED ORDER — FLUCONAZOLE 150 MG PO TABS: ORAL_TABLET | ORAL | Status: DC

## 2013-08-24 MED ORDER — AZITHROMYCIN 250 MG PO TABS: ORAL_TABLET | ORAL | Status: DC

## 2013-08-24 NOTE — Patient Instructions (Signed)
Mucinex maximum strength, blue and white in color, 1 twice daily with a large glass of water--- this is over-the-counter Take medication as directed Drink plenty of fluids Use a cool mist humidifier in her bedroom at nighttime Take Tylenol for aches pains or fever

## 2013-08-24 NOTE — Progress Notes (Signed)
Subjective:    Patient ID: Kristy [name], female    DOB: October 31, 1927, 79 y.o.   MRN: [id number]  HPI Patient here today for cough, congestion and vaginal itching. The cough and chest congestion have been going on for about a week. She has also had some vaginal itching.    Patient Active Problem List   Diagnosis Date Noted  . Essential hypertension, benign 06/08/2013  . Hyperlipidemia 06/08/2013  . Vitamin D deficiency 06/08/2013  . Diabetes mellitus type 2 controlled 06/08/2013  . OA (osteoarthritis) of knee 01/18/2013  . CLL (chronic lymphocytic leukemia) 06/17/2012   Outpatient Encounter Prescriptions as of 08/24/2013  Medication Sig  . Advocate Lancets MISC 1 Stick by Does not apply route 2 (two) times daily.  Marland Kitchen ALPRAZolam (XANAX) 0.25 MG tablet Take 1 tablet (0.25 mg total) by mouth 3 (three) times daily as needed for anxiety.  . fenofibrate micronized (LOFIBRA) 134 MG capsule Take 134 mg by mouth at bedtime.   . furosemide (LASIX) 40 MG tablet Take 40 mg by mouth 2 (two) times a week.   Marland Kitchen glucose blood test strip Use as instructed  . glyBURIDE-metformin (GLUCOVANCE) 5-500 MG per tablet TAKE 2 TABLETS BY MOUTH TWICE DAILY  . hydroxypropyl methylcellulose (ISOPTO TEARS) 2.5 % ophthalmic solution Place 1 drop into both eyes 2 (two) times daily as needed (dry eyes).  Marland Kitchen lisinopril (PRINIVIL,ZESTRIL) 10 MG tablet Take 0.5 tablets (5 mg total) by mouth daily.  . metoprolol succinate (TOPROL-XL) 25 MG 24 hr tablet TAKE ONE TABLET BY MOUTH ONE TIME DAILY  . pravastatin (PRAVACHOL) 80 MG tablet Take 80 mg by mouth every morning.   Marland Kitchen ZETIA 10 MG tablet TAKE ONE TABLET BY MOUTH EVERY DAY AS DIRECTED  . [DISCONTINUED] gabapentin (NEURONTIN) 300 MG capsule Take 1 capsule (300 mg total) by mouth 4 (four) times daily.    Review of Systems  Constitutional: Negative.   HENT: Positive for congestion.   Eyes: Negative.   Respiratory: Positive for cough (x 10 days).   Cardiovascular:  Negative.   Gastrointestinal: Negative.   Endocrine: Negative.   Genitourinary: Positive for vaginal discharge (itching).  Musculoskeletal: Negative.   Skin: Negative.   Allergic/Immunologic: Negative.   Neurological: Negative.   Hematological: Negative.   Psychiatric/Behavioral: Negative.        Objective:   Physical Exam  Nursing note and vitals reviewed. Constitutional: She is oriented to person, place, and time. She appears well-developed and well-nourished. No distress.  HENT:  Head: Normocephalic and atraumatic.  Right Ear: External ear normal.  Left Ear: External ear normal.  Nose: Nose normal.  Mouth/Throat: Oropharynx is clear and moist. No oropharyngeal exudate.  Patient wears bilateral hearing aids  Eyes: Conjunctivae and EOM are normal. Pupils are equal, round, and reactive to light. Right eye exhibits no discharge. Left eye exhibits no discharge. No scleral icterus.  Neck: Normal range of motion. Neck supple. No thyromegaly present.  Cardiovascular: Normal rate, regular rhythm and normal heart sounds.   No murmur heard. Pulmonary/Chest: Effort normal. No respiratory distress. She has no wheezes. She has no rales.   Congested cough  Musculoskeletal: Normal range of motion.  Lymphadenopathy:    She has no cervical adenopathy.  Neurological: She is alert and oriented to person, place, and time.  Skin: Skin is warm and dry. No rash noted.  Psychiatric: She has a normal mood and affect. Her behavior is normal. Judgment and thought content normal.   BP 121/69  Pulse 87  Temp(Src)  96.6 F (35.9 C) (Oral)  Ht 5\' 3"  (1.6 m)  Wt 158 lb (71.668 kg)  BMI 28.00 kg/m2        Assessment & Plan:   1. Acute bronchiolitis due to other infectious organisms   2. Vaginal itching   3. Depression   4. Diabetes   5. Yeast vaginitis    Orders Placed This Encounter  Procedures  . POCT Wet Prep [employer])  . POCT UA - Microscopic Only  . POCT urinalysis dipstick    Meds ordered this encounter  Medications  . azithromycin (ZITHROMAX) 250 MG tablet    Sig: 2 tabs the first day then one daily until completed    Dispense:  6 tablet    Refill:  0  . fluconazole (DIFLUCAN) 150 MG tablet    Sig: Take 1 tablet stat and wait one week and repeat    Dispense:  2 tablet    Refill:  1   Patient Instructions  Mucinex maximum strength, blue and white in color, 1 twice daily with a large glass of water--- this is over-the-counter Take medication as directed Drink plenty of fluids Use a cool mist humidifier in her bedroom at nighttime Take Tylenol for aches pains or fever   Nyra Capes MD

## 2013-09-07 ENCOUNTER — Encounter (INDEPENDENT_AMBULATORY_CARE_PROVIDER_SITE_OTHER): Payer: Medicare Other | Admitting: Family Medicine

## 2013-09-07 ENCOUNTER — Telehealth: Payer: Self-pay | Admitting: *Deleted

## 2013-09-07 DIAGNOSIS — R5381 Other malaise: Secondary | ICD-10-CM

## 2013-09-07 DIAGNOSIS — E1059 Type 1 diabetes mellitus with other circulatory complications: Secondary | ICD-10-CM

## 2013-09-07 LAB — POCT CBC
Granulocyte percent: 38.4 %G (ref 37–80)
HCT, POC: 41.4 % (ref 37.7–47.9)
MCHC: 32.2 g/dL (ref 31.8–35.4)
MPV: 9.8 fL (ref 0–99.8)
POC Granulocyte: 5.3 (ref 2–6.9)
POC LYMPH PERCENT: 54.7 %L — AB (ref 10–50)
Platelet Count, POC: 188 10*3/uL (ref 142–424)
RDW, POC: 13.7 %

## 2013-09-07 LAB — POCT GLYCOSYLATED HEMOGLOBIN (HGB A1C): Hemoglobin A1C: 6.5

## 2013-09-07 NOTE — Telephone Encounter (Signed)
Pt has appt with Dr Christell Constant this afternoon and I left message that he will be out of the office this afternoon and we need to reschedule her appointment.

## 2013-09-08 NOTE — Progress Notes (Signed)
   Subjective:    Patient ID: Kristy [name], female    DOB: [birth year], 79 y.o.   MRN: [id number]  HPI    Review of Systems     Objective:   Physical Exam        Assessment & Plan:

## 2013-09-09 ENCOUNTER — Ambulatory Visit: Payer: Medicare Other | Admitting: Family Medicine

## 2013-09-09 LAB — BMP8+EGFR
CO2: 22 mmol/L (ref 18–29)
Calcium: 10.1 mg/dL (ref 8.6–10.2)
Chloride: 102 mmol/L (ref 97–108)
Glucose: 156 mg/dL — ABNORMAL HIGH (ref 65–99)
Potassium: 4.6 mmol/L (ref 3.5–5.2)

## 2013-09-09 LAB — HEPATIC FUNCTION PANEL
Albumin: 4.2 g/dL (ref 3.5–4.7)
Bilirubin, Direct: 0.18 mg/dL (ref 0.00–0.40)
Total Bilirubin: 0.4 mg/dL (ref 0.0–1.2)
Total Protein: 6.4 g/dL (ref 6.0–8.5)

## 2013-09-09 LAB — NMR, LIPOPROFILE
LDL Particle Number: 1011 nmol/L — ABNORMAL HIGH (ref <1000)
LDL Size: 20.2 nm — ABNORMAL LOW (ref >20.5)
LP-IR Score: 65 — ABNORMAL HIGH (ref <=45)

## 2013-09-20 ENCOUNTER — Other Ambulatory Visit: Payer: Self-pay | Admitting: Family Medicine

## 2013-09-21 ENCOUNTER — Ambulatory Visit (INDEPENDENT_AMBULATORY_CARE_PROVIDER_SITE_OTHER): Payer: Medicare Other | Admitting: Family Medicine

## 2013-09-21 ENCOUNTER — Encounter: Payer: Self-pay | Admitting: Family Medicine

## 2013-09-21 VITALS — Ht 63.0 in

## 2013-09-21 DIAGNOSIS — E785 Hyperlipidemia, unspecified: Secondary | ICD-10-CM

## 2013-09-21 DIAGNOSIS — E559 Vitamin D deficiency, unspecified: Secondary | ICD-10-CM

## 2013-09-21 DIAGNOSIS — E119 Type 2 diabetes mellitus without complications: Secondary | ICD-10-CM

## 2013-09-21 DIAGNOSIS — I10 Essential (primary) hypertension: Secondary | ICD-10-CM

## 2013-09-21 DIAGNOSIS — Z23 Encounter for immunization: Secondary | ICD-10-CM

## 2013-09-21 NOTE — Progress Notes (Signed)
Subjective:    Patient ID: Kristy [name], female    DOB: 07-Mar-1928, 79 y.o.   MRN: [id number]  HPI Pt here for follow up and management of chronic medical problems. Patient is doing well but says she feels very weak and is having a lot of problems with her left knee. She is seeing the orthopedist tomorrow for another injection in a series of injections. Her labs were reviewed with her today. Her hemoglobin A1c was good her cholesterol numbers were fair. She will be given and FOBT to return today. She will also be given a Prevnar vaccine today.     Patient Active Problem List   Diagnosis Date Noted  . Depression 08/24/2013  . Essential hypertension, benign 06/08/2013  . Hyperlipidemia 06/08/2013  . Vitamin D deficiency 06/08/2013  . Diabetes mellitus type 2 controlled 06/08/2013  . OA (osteoarthritis) of knee 01/18/2013  . CLL (chronic lymphocytic leukemia) 06/17/2012   Outpatient Encounter Prescriptions as of 09/21/2013  Medication Sig  . Advocate Lancets MISC 1 Stick by Does not apply route 2 (two) times daily.  Marland Kitchen ALPRAZolam (XANAX) 0.25 MG tablet Take 1 tablet (0.25 mg total) by mouth 3 (three) times daily as needed for anxiety.  . fenofibrate micronized (LOFIBRA) 134 MG capsule Take 134 mg by mouth at bedtime.   . furosemide (LASIX) 40 MG tablet Take 40 mg by mouth 2 (two) times a week.   Marland Kitchen glucose blood test strip Use as instructed  . glyBURIDE-metformin (GLUCOVANCE) 5-500 MG per tablet TAKE 2 TABLETS BY MOUTH TWICE DAILY  . hydroxypropyl methylcellulose (ISOPTO TEARS) 2.5 % ophthalmic solution Place 1 drop into both eyes 2 (two) times daily as needed (dry eyes).  . metoprolol succinate (TOPROL-XL) 25 MG 24 hr tablet TAKE ONE TABLET BY MOUTH ONE TIME DAILY  . pravastatin (PRAVACHOL) 80 MG tablet TAKE ONE TABLET BY MOUTH AT BEDTIME  . ZETIA 10 MG tablet TAKE ONE TABLET BY MOUTH EVERY DAY AS DIRECTED  . [DISCONTINUED] azithromycin (ZITHROMAX) 250 MG tablet 2 tabs the first day  then one daily until completed  . [DISCONTINUED] fluconazole (DIFLUCAN) 150 MG tablet Take 1 tablet stat and wait one week and repeat  . [DISCONTINUED] lisinopril (PRINIVIL,ZESTRIL) 10 MG tablet Take 0.5 tablets (5 mg total) by mouth daily.    Review of Systems  Constitutional: Negative.   HENT: Negative.   Eyes: Negative.   Respiratory: Negative.   Cardiovascular: Negative.   Gastrointestinal: Negative.   Endocrine: Negative.   Genitourinary: Negative.   Musculoskeletal: Positive for arthralgias (having a injection knee tomorrow with Dr Berton Lan).  Skin: Negative.   Allergic/Immunologic: Negative.   Neurological: Negative.   Hematological: Negative.   Psychiatric/Behavioral: Negative.        Objective:   Physical Exam  Nursing note and vitals reviewed. Constitutional: She is oriented to person, place, and time. She appears well-developed and well-nourished. No distress.  Pleasant but slow with movements due to ongoing left knee pain  HENT:  Head: Normocephalic and atraumatic.  Right Ear: External ear normal.  Left Ear: External ear normal.  Nose: Nose normal.  Mouth/Throat: Oropharynx is clear and moist.  Eyes: Conjunctivae and EOM are normal. Pupils are equal, round, and reactive to light. Right eye exhibits no discharge. Left eye exhibits no discharge. No scleral icterus.  Neck: Normal range of motion. Neck supple. No JVD present. No thyromegaly present.  No carotid bruits  Cardiovascular: Normal rate, regular rhythm, normal heart sounds and intact distal pulses.  Exam  reveals no gallop and no friction rub.   No murmur heard. At 72 per minute  Pulmonary/Chest: Effort normal and breath sounds normal. No respiratory distress. She has no wheezes. She has no rales. She exhibits no tenderness.  Abdominal: Soft. Bowel sounds are normal. She exhibits no mass. There is no tenderness. There is no rebound and no guarding.  Patient has a ventral hernia and obesity  Genitourinary:    Breast check today revealed no masses or axillary nodes  Musculoskeletal: She exhibits no edema and no tenderness.  But is slow and hesitant secondary to left knee pain, she was only able to get up on the exam room table with assistance  Lymphadenopathy:    She has no cervical adenopathy.  Neurological: She is alert and oriented to person, place, and time. She has normal reflexes. No cranial nerve deficit.  Skin: Skin is warm and dry.  Psychiatric: She has a normal mood and affect. Her behavior is normal. Judgment and thought content normal.  Positive affect despite the discomfort in her knee   Ht 5\' 3"  (1.6 m)        Assessment & Plan:    1. Diabetes mellitus type 2 controlled  2. Essential hypertension, benign  3. Hyperlipidemia  4. Vitamin D deficiency  5. Need for prophylactic vaccination against Streptococcus pneumoniae (pneumococcus) - Pneumococcal conjugate vaccine 13-valent  Orders Placed This Encounter  Procedures  . Pneumococcal conjugate vaccine 13-valent   No orders of the defined types were placed in this encounter.   Patient Instructions  Continue current medications. Continue good therapeutic lifestyle changes which include good diet and exercise. Fall precautions discussed with patient. Schedule your flu vaccine if you haven't had it yet If you are over 16 years old - you may need Prevnar 13 or the adult Pneumonia vaccine.    Nyra Capes MD

## 2013-09-21 NOTE — Telephone Encounter (Signed)
This patient was seen today and this med is not on her EPIC list

## 2013-09-21 NOTE — Patient Instructions (Signed)
Continue current medications. Continue good therapeutic lifestyle changes which include good diet and exercise. Fall precautions discussed with patient. Schedule your flu vaccine if you haven't had it yet If you are over 77 years old - you may need Prevnar 13 or the adult Pneumonia vaccine.  

## 2013-09-28 ENCOUNTER — Telehealth: Payer: Self-pay | Admitting: Family Medicine

## 2013-09-28 DIAGNOSIS — E119 Type 2 diabetes mellitus without complications: Secondary | ICD-10-CM

## 2013-09-28 MED ORDER — FREESTYLE LANCETS MISC: Status: DC

## 2013-09-28 MED ORDER — GLUCOSE BLOOD VI STRP: ORAL_STRIP | Status: DC

## 2013-09-28 MED ORDER — FREESTYLE SYSTEM KIT: 1.0000 | PACK | Status: DC | PRN

## 2013-09-28 NOTE — Telephone Encounter (Signed)
Pt aware to pick up

## 2013-09-28 NOTE — Telephone Encounter (Signed)
This is okay to give her a prescription for a meter and supplies to pick up today

## 2013-10-28 ENCOUNTER — Other Ambulatory Visit: Payer: Medicare Other

## 2013-10-28 NOTE — Progress Notes (Signed)
Pt came in for labs only 

## 2013-10-29 LAB — FECAL OCCULT BLOOD, IMMUNOCHEMICAL: Fecal Occult Bld: NEGATIVE

## 2013-11-04 ENCOUNTER — Telehealth: Payer: Self-pay | Admitting: Family Medicine

## 2013-11-05 ENCOUNTER — Other Ambulatory Visit: Payer: Medicare Other | Admitting: General Practice

## 2013-11-05 NOTE — Telephone Encounter (Signed)
Spoke with patient.

## 2013-11-08 ENCOUNTER — Other Ambulatory Visit: Payer: Medicare Other

## 2013-11-08 DIAGNOSIS — Z1212 Encounter for screening for malignant neoplasm of rectum: Secondary | ICD-10-CM

## 2013-11-08 NOTE — Progress Notes (Signed)
Pt dropped off specimen only 

## 2013-11-10 LAB — FECAL OCCULT BLOOD, IMMUNOCHEMICAL: Fecal Occult Bld: NEGATIVE

## 2013-11-12 ENCOUNTER — Telehealth: Payer: Self-pay | Admitting: Family Medicine

## 2013-11-12 NOTE — Telephone Encounter (Signed)
Patient aware.

## 2013-11-15 ENCOUNTER — Other Ambulatory Visit: Payer: Self-pay | Admitting: Family Medicine

## 2013-11-18 ENCOUNTER — Encounter: Payer: Self-pay | Admitting: *Deleted

## 2013-11-18 NOTE — Progress Notes (Signed)
Quick Note:  Copy of labs sent to patient ______ 

## 2013-11-23 ENCOUNTER — Telehealth: Payer: Self-pay | Admitting: *Deleted

## 2013-11-23 MED ORDER — EZETIMIBE 10 MG PO TABS: 10.0000 mg | ORAL_TABLET | Freq: Every day | ORAL | Status: DC

## 2013-11-23 NOTE — Telephone Encounter (Signed)
Discontinue  zetia

## 2013-11-23 NOTE — Telephone Encounter (Signed)
Zetia has gone up to $200 a month.  I will provide samples for now but do you want to switch her to something else?

## 2013-11-30 ENCOUNTER — Other Ambulatory Visit: Payer: Self-pay | Admitting: Family Medicine

## 2013-12-01 NOTE — Telephone Encounter (Signed)
Last seen 09/21/13 DW[facility]  Last glucose 09/07/13

## 2013-12-06 ENCOUNTER — Ambulatory Visit (INDEPENDENT_AMBULATORY_CARE_PROVIDER_SITE_OTHER): Payer: Medicare Other

## 2013-12-06 ENCOUNTER — Ambulatory Visit (INDEPENDENT_AMBULATORY_CARE_PROVIDER_SITE_OTHER): Payer: Medicare Other | Admitting: Family Medicine

## 2013-12-06 ENCOUNTER — Encounter: Payer: Self-pay | Admitting: Family Medicine

## 2013-12-06 VITALS — BP 131/68 | HR 78 | Temp 98.2°F | Ht 63.0 in | Wt 169.0 lb

## 2013-12-06 DIAGNOSIS — M26649 Arthritis of unspecified temporomandibular joint: Secondary | ICD-10-CM

## 2013-12-06 DIAGNOSIS — M179 Osteoarthritis of knee, unspecified: Secondary | ICD-10-CM

## 2013-12-06 DIAGNOSIS — IMO0002 Reserved for concepts with insufficient information to code with codable children: Secondary | ICD-10-CM

## 2013-12-06 DIAGNOSIS — H9190 Unspecified hearing loss, unspecified ear: Secondary | ICD-10-CM

## 2013-12-06 DIAGNOSIS — I10 Essential (primary) hypertension: Secondary | ICD-10-CM

## 2013-12-06 DIAGNOSIS — L918 Other hypertrophic disorders of the skin: Secondary | ICD-10-CM

## 2013-12-06 DIAGNOSIS — E559 Vitamin D deficiency, unspecified: Secondary | ICD-10-CM

## 2013-12-06 DIAGNOSIS — L919 Hypertrophic disorder of the skin, unspecified: Secondary | ICD-10-CM

## 2013-12-06 DIAGNOSIS — E785 Hyperlipidemia, unspecified: Secondary | ICD-10-CM

## 2013-12-06 DIAGNOSIS — M1710 Unilateral primary osteoarthritis, unspecified knee: Secondary | ICD-10-CM

## 2013-12-06 DIAGNOSIS — L909 Atrophic disorder of skin, unspecified: Secondary | ICD-10-CM

## 2013-12-06 DIAGNOSIS — E119 Type 2 diabetes mellitus without complications: Secondary | ICD-10-CM

## 2013-12-06 DIAGNOSIS — M2669 Other specified disorders of temporomandibular joint: Secondary | ICD-10-CM

## 2013-12-06 DIAGNOSIS — K432 Incisional hernia without obstruction or gangrene: Secondary | ICD-10-CM

## 2013-12-06 NOTE — Patient Instructions (Addendum)
Medicare Annual Wellness Visit  [facility] and the medical providers at [facility] strive to bring you the best medical care.  In doing so we not only want to address your current medical conditions and concerns but also to detect new conditions early and prevent illness, disease and health-related problems.    Medicare offers a yearly Wellness Visit which allows our clinical staff to assess your need for preventative services including immunizations, lifestyle education, counseling to decrease risk of preventable diseases and screening for fall risk and other medical concerns.    This visit is provided free of charge (no copay) for all Medicare recipients. The clinical pharmacists at [facility] have begun to conduct these Wellness Visits which will also include a thorough review of all your medications.    As you primary medical provider recommend that you make an appointment for your Annual Wellness Visit if you have not done so already this year.  You may set up this appointment before you leave today or you may call back ([phone]) and schedule an appointment.  Please make sure when you call that you mention that you are scheduling your Annual Wellness Visit with the clinical pharmacist so that the appointment may be made for the proper length of time.     Continue current medications. Continue good therapeutic lifestyle changes which include good diet and exercise. Fall precautions discussed with patient. If an FOBT was given today- please return it to our front desk. Continue ibuprofen Use warm wet compresses on your right temporomandibular joint area Try to avoid chewing as much  We will do a referral to the cardiologist because of the planned knee surgery for surgical clearance

## 2013-12-06 NOTE — Progress Notes (Signed)
Subjective:    Patient ID: Kristy [name], female    DOB: 17-Apr-1928, 80 y.o.   MRN: [id number]  HPI Pt here for follow up and management of chronic medical problems and Surgical Clearance for Right knee surgery. On health maintenance issues, she is due to get a chest x-ray lab work and she will need a cardiac clearance an EKG from her cardiologist. She is also due a mammogram and pelvic exam. She also complains of a skin tag is irritated on her left ear pain. She also wants me to look at her incisional hernia.     Patient Active Problem List   Diagnosis Date Noted  . Depression 08/24/2013  . Essential hypertension, benign 06/08/2013  . Hyperlipidemia 06/08/2013  . Vitamin D deficiency 06/08/2013  . Diabetes mellitus type 2 controlled 06/08/2013  . OA (osteoarthritis) of knee 01/18/2013  . CLL (chronic lymphocytic leukemia) 06/17/2012   Outpatient Encounter Prescriptions as of 12/06/2013  Medication Sig  . Advocate Lancets MISC 1 Stick by Does not apply route 2 (two) times daily.  Marland Kitchen ALPRAZolam (XANAX) 0.25 MG tablet Take 1 tablet (0.25 mg total) by mouth 3 (three) times daily as needed for anxiety.  Marland Kitchen estradiol (ESTRACE) 1 MG tablet TAKE ONE TABLET BY MOUTH TWICE DAILY  . ezetimibe (ZETIA) 10 MG tablet Take 10 mg by mouth daily. As directed  . fenofibrate micronized (LOFIBRA) 134 MG capsule Take 134 mg by mouth at bedtime.   . furosemide (LASIX) 40 MG tablet Take 40 mg by mouth 2 (two) times a week.   Marland Kitchen glucose blood (FREESTYLE TEST STRIPS) test strip Use as instructed  . glucose blood test strip Use as instructed  . glucose monitoring kit (FREESTYLE) monitoring kit 1 each by Does not apply route as needed for other. Dx: 250.0. Patient to check blood sugar twice a day and as needed.  . glyBURIDE-metformin (GLUCOVANCE) 5-500 MG per tablet TAKE 2 TABLETS BY MOUTH TWICE DAILY  . hydroxypropyl methylcellulose (ISOPTO TEARS) 2.5 % ophthalmic solution Place 1 drop into both eyes 2 (two)  times daily as needed (dry eyes).  . Lancets (FREESTYLE) lancets Use as instructed  . lisinopril (PRINIVIL,ZESTRIL) 10 MG tablet Take 10 mg by mouth daily. As directed  . metoprolol succinate (TOPROL-XL) 25 MG 24 hr tablet TAKE ONE TABLET BY MOUTH ONE TIME DAILY  . pravastatin (PRAVACHOL) 80 MG tablet TAKE ONE TABLET BY MOUTH AT BEDTIME  . [DISCONTINUED] ezetimibe (ZETIA) 10 MG tablet Take 1 tablet (10 mg total) by mouth daily.    Review of Systems  Constitutional: Negative.   HENT: Negative.   Eyes: Negative.   Respiratory: Negative.   Cardiovascular: Positive for leg swelling.  Gastrointestinal: Negative.        Indigestion - would like rx med  Endocrine: Negative.   Genitourinary: Positive for enuresis.  Musculoskeletal: Positive for arthralgias (right knee pain).  Skin: Negative.   Allergic/Immunologic: Negative.   Neurological: Negative.   Hematological: Negative.   Psychiatric/Behavioral: Negative.        Objective:   Physical Exam  Nursing note and vitals reviewed. Constitutional: She is oriented to person, place, and time. She appears well-developed and well-nourished. No distress.  Pleasant and cooperative  HENT:  Head: Normocephalic and atraumatic.  Right Ear: External ear normal.  Left Ear: External ear normal.  Nose: Nose normal.  Mouth/Throat: Oropharynx is clear and moist.  Right temporomandibular joint tenderness with applied pressure with opening and closing the jaw  Eyes: Conjunctivae and  EOM are normal. Pupils are equal, round, and reactive to light. Right eye exhibits no discharge. Left eye exhibits no discharge. No scleral icterus.  Neck: Normal range of motion. Neck supple. No JVD present. No thyromegaly present.  No carotid bruits  Cardiovascular: Normal rate, regular rhythm, normal heart sounds and intact distal pulses.  Exam reveals no gallop and no friction rub.   No murmur heard. At 72 per minute  Pulmonary/Chest: Effort normal and breath sounds  normal. No respiratory distress. She has no wheezes. She has no rales.  Abdominal: Soft. Bowel sounds are normal. She exhibits no distension and no mass. There is no tenderness. There is no rebound and no guarding.  Obesity. Large epigastric incisional hernia present without tenderness.  Musculoskeletal: Normal range of motion. She exhibits no edema and no tenderness.  Lymphadenopathy:    She has no cervical adenopathy.  Neurological: She is alert and oriented to person, place, and time. She has normal reflexes. No cranial nerve deficit.  Skin: Skin is warm and dry. No rash noted.  Skin tag left ear.   Psychiatric: She has a normal mood and affect. Her behavior is normal. Judgment and thought content normal.   BP 131/68  Pulse 78  Temp(Src) 98.2 F (36.8 C) (Oral)  Ht _0  (1.6 m)  Wt 169 lb (76.658 kg)  BMI 29.94 kg/m2  [facility] reading (PRIMARY) by  Dr.Fawaz Borquez-chest x-ray --no active disease                                      Assessment & Plan:  1. Vitamin D deficiency - Vit D  25 hydroxy (rtn osteoporosis monitoring); Future  2. Hyperlipidemia - NMR, lipoprofile; Future  3. OA (osteoarthritis) of knee - POCT CBC; Future  4. Essential hypertension, benign - POCT CBC; Future - BMP8+EGFR; Future - Hepatic function panel; Future - DG Chest 2 View; Future  5. Diabetes mellitus type 2 controlled - POCT CBC; Future - POCT glycosylated hemoglobin (Hb A1C); Future  6. Incisional hernia, without obstruction or gangrene  7. Skin tag  8. Hearing deficit  9. TMJ arthritis  Patient Instructions                       Medicare Annual Wellness Visit  [facility] and the medical providers at [facility] strive to bring you the best medical care.  In doing so we not only want to address your current medical conditions and concerns but also to detect new conditions early and prevent illness, disease and health-related problems.    Medicare offers a yearly  Wellness Visit which allows our clinical staff to assess your need for preventative services including immunizations, lifestyle education, counseling to decrease risk of preventable diseases and screening for fall risk and other medical concerns.    This visit is provided free of charge (no copay) for all Medicare recipients. The clinical pharmacists at [facility] have begun to conduct these Wellness Visits which will also include a thorough review of all your medications.    As you primary medical provider recommend that you make an appointment for your Annual Wellness Visit if you have not done so already this year.  You may set up this appointment before you leave today or you may call back ([phone]) and schedule an appointment.  Please make sure when you call that you mention  that you are scheduling your Annual Wellness Visit with the clinical pharmacist so that the appointment may be made for the proper length of time.     Continue current medications. Continue good therapeutic lifestyle changes which include good diet and exercise. Fall precautions discussed with patient. If an FOBT was given today- please return it to our front desk. Continue ibuprofen Use warm wet compresses on your right temporomandibular joint area Try to avoid chewing as much  We will do a referral to the cardiologist because of the planned knee surgery for surgical clearance   Arrie Senate MD

## 2013-12-14 ENCOUNTER — Other Ambulatory Visit: Payer: Self-pay | Admitting: *Deleted

## 2013-12-14 MED ORDER — ESOMEPRAZOLE MAGNESIUM 40 MG PO CPDR: 40.0000 mg | DELAYED_RELEASE_CAPSULE | Freq: Every day | ORAL | Status: DC

## 2013-12-21 ENCOUNTER — Other Ambulatory Visit: Payer: Self-pay | Admitting: Family Medicine

## 2014-01-05 ENCOUNTER — Ambulatory Visit: Payer: Medicare Other | Admitting: Family Medicine

## 2014-01-18 ENCOUNTER — Telehealth: Payer: Self-pay | Admitting: Family Medicine

## 2014-01-18 NOTE — Telephone Encounter (Signed)
zetia- wants samples- given x 3 boxes Pt aware

## 2014-01-26 ENCOUNTER — Ambulatory Visit (INDEPENDENT_AMBULATORY_CARE_PROVIDER_SITE_OTHER): Payer: Medicare Other | Admitting: Cardiology

## 2014-01-26 ENCOUNTER — Encounter: Payer: Self-pay | Admitting: Cardiology

## 2014-01-26 VITALS — BP 117/66 | HR 71 | Ht 63.0 in | Wt 172.0 lb

## 2014-01-26 DIAGNOSIS — E785 Hyperlipidemia, unspecified: Secondary | ICD-10-CM

## 2014-01-26 DIAGNOSIS — I10 Essential (primary) hypertension: Secondary | ICD-10-CM

## 2014-01-26 NOTE — Progress Notes (Signed)
HPI The patient presents for preoperative prior to right knee replacement.  I saw her pre operatively last year for her other knee.  She did very well with this surgery.  She had no cardiac complications.  She has had no cardiovascular symptoms since she had a negative stress perfusion study in 2007.  She stays active She does some light housekeeping and can climb stairs (greater than 4 METS).  With this she denies any symptoms. The patient denies any new symptoms such as chest discomfort, neck or arm discomfort. There has been no new shortness of breath, PND or orthopnea. There have been no reported palpitations, presyncope or syncope.  In particular the patient has had none of the symptoms consistent with her supraventricular tachycardia.  Allergies  Allergen Reactions  . Lipitor [Atorvastatin Calcium] Other (See Comments)    Unknown   . Vioxx [Rofecoxib] Other (See Comments)    Ulcers in mouth     Current Outpatient Prescriptions  Medication Sig Dispense Refill  . ALPRAZolam (XANAX) 0.25 MG tablet Take 1 tablet (0.25 mg total) by mouth 3 (three) times daily as needed for anxiety.  90 tablet  0  . esomeprazole (NEXIUM) 40 MG capsule Take 1 capsule (40 mg total) by mouth daily.  30 capsule  3  . estradiol (ESTRACE) 1 MG tablet TAKE ONE TABLET BY MOUTH TWICE DAILY  60 tablet  11  . ezetimibe (ZETIA) 10 MG tablet Take 10 mg by mouth daily. As directed      . fenofibrate micronized (LOFIBRA) 134 MG capsule TAKE  ONE CAPSULE BY MOUTH NIGHTLY AT BEDTIME  30 capsule  3  . furosemide (LASIX) 40 MG tablet Take 40 mg by mouth 2 (two) times a week.       . hydroxypropyl methylcellulose (ISOPTO TEARS) 2.5 % ophthalmic solution Place 1 drop into both eyes 2 (two) times daily as needed (dry eyes).      Marland Kitchen lisinopril (PRINIVIL,ZESTRIL) 10 MG tablet Take 10 mg by mouth daily. As directed      . metoprolol succinate (TOPROL-XL) 25 MG 24 hr tablet TAKE ONE TABLET BY MOUTH ONE  TIME DAILY  30 tablet  3  .  pravastatin (PRAVACHOL) 80 MG tablet TAKE ONE TABLET BY MOUTH AT BEDTIME  90 tablet  0  . Advocate Lancets MISC 1 Stick by Does not apply route 2 (two) times daily.  100 each  2  . glucose blood (FREESTYLE TEST STRIPS) test strip Use as instructed  100 each  12  . glucose blood test strip Use as instructed  100 each  2  . glucose monitoring kit (FREESTYLE) monitoring kit 1 each by Does not apply route as needed for other. Dx: 250.0. Patient to check blood sugar twice a day and as needed.  1 each  1  . glyBURIDE-metformin (GLUCOVANCE) 5-500 MG per tablet TAKE 2 TABLETS BY MOUTH TWICE DAILY  120 tablet  0  . Lancets (FREESTYLE) lancets Use as instructed  100 each  12   No current facility-administered medications for this visit.    Past Medical History  Diagnosis Date  . Diabetes mellitus   . Hypercholesteremia   . Colon polyps   . Diverticulitis   . Anxiety   . H/O hiatal hernia   . Hypertension     Dr. Laurance Flatten  . History of shingles 01-07-13    3'14 recent outbreak- drying in   . Lumbar spondylosis     osteoarthritis-knees  . Hearing loss 01-07-13  bilateral hearing aids    Past Surgical History  Procedure Laterality Date  . Cholecystectomy    . Abdominal hysterectomy    . Cataract extraction    . Lumbar back surgery    . Ventral hernia repair    . Knee arthroscopy  01-07-13    left knee- many yrs ago  . Total knee arthroplasty Left 01/18/2013    Procedure: TOTAL KNEE ARTHROPLASTY;  Surgeon: Gearlean Alf, MD;  Location: [facility];  Service: Orthopedics;  Laterality: Left;    ROS:  Positive difficulty hearing.    Otherwise as stated in the HPI and negative for all other systems.  PHYSICAL EXAM BP 117/66  Pulse 71  Ht '5\' 3"'  (1.6 m)  Wt 172 lb (78.019 kg)  BMI 30.48 kg/m2 GENERAL:  Well appearing NECK:  No jugular venous distention, waveform within normal limits, carotid upstroke brisk and symmetric, no bruits, no thyromegaly LUNGS:  Clear to auscultation  bilaterally BACK:  No CVA tenderness CHEST:  Unremarkable HEART:  PMI not displaced or sustained,S1 and S2 within normal limits, no S3, no S4, no clicks, no rubs, no murmurs ABD:  Flat, positive bowel sounds normal in frequency in pitch, no bruits, no rebound, no guarding, no midline pulsatile mass, no hepatomegaly, no splenomegaly EXT:  2 plus pulses throughout, no edema, no cyanosis no clubbing   EKG:  Sinus rhythm, rate 76, leftward axis, no acute ST-T wave. 01/26/2014  ASSESSMENT AND PLAN  Preop Exam - The patient has no high-risk physical findings or symptoms.  She has a good functional level.  Therefore, based on ACC/AHA guidelines, the patient would be at acceptable risk for the planned procedure without further cardiovascular testing.  Hypertension - The blood pressure is at target. No change in medications is indicated. We will continue with therapeutic lifestyle changes (TLC).  Hyperlipidemia - She has an excellent lipid profile. No change in therapy is suggested. This is followed by Dr. Laurance Flatten.  SVT - The patient has had no symptomatic tachypalpitations. She will continue on the beta blocker.

## 2014-01-26 NOTE — Patient Instructions (Signed)
The current medical regimen is effective;  continue present plan and medications.  Follow up in 1 year with Dr Hochrein.  You will receive a letter in the mail 2 months before you are due.  Please call us when you receive this letter to schedule your follow up appointment.  

## 2014-02-07 ENCOUNTER — Other Ambulatory Visit: Payer: Self-pay | Admitting: Family Medicine

## 2014-02-16 NOTE — Progress Notes (Signed)
Arlee Muslim, PA  - Please enter preop orders in Epic for [facility] Ahlers - surg date 6/8.  Thanks.

## 2014-02-18 ENCOUNTER — Encounter (HOSPITAL_COMMUNITY): Payer: Self-pay | Admitting: Pharmacy Technician

## 2014-02-21 ENCOUNTER — Other Ambulatory Visit: Payer: Self-pay | Admitting: Orthopedic Surgery

## 2014-02-23 ENCOUNTER — Other Ambulatory Visit: Payer: Self-pay | Admitting: Family Medicine

## 2014-02-25 ENCOUNTER — Other Ambulatory Visit (HOSPITAL_COMMUNITY): Payer: Self-pay | Admitting: Orthopedic Surgery

## 2014-02-25 NOTE — Patient Instructions (Addendum)
[place]  02/25/2014   Your procedure is scheduled on: Monday June 8th, 2015  Report to [facility] Main Entrance and follow signs to  [place] at 625 AM.  Call this number if you have problems the morning of surgery [phone]   Remember:  Do not eat food or drink liquids :After Midnight.     Take these medicines the morning of surgery with A SIP OF WATER: metoprolol succinate nexium, alprazolam if needed eye drop                               You may not have any metal on your body including hair pins and piercings  Do not wear jewelry, make-up, lotions, powders, or deodorant.   Men may shave face and neck.  Do not bring valuables to the hospital. [place].  Contacts, dentures or bridgework may not be worn into surgery.  Leave suitcase in the car. After surgery it may be brought to your room.  For patients admitted to the hospital, checkout time is 11:00 AM the day of discharge.   ___________________________________________  [facility] - Preparing for Surgery Before surgery, you can play an important role.  Because skin is not sterile, your skin needs to be as free of germs as possible.  You can reduce the number of germs on your skin by washing with CHG (chlorahexidine gluconate) soap before surgery.  CHG is an antiseptic cleaner which kills germs and bonds with the skin to continue killing germs even after washing. Please DO NOT use if you have an allergy to CHG or antibacterial soaps.  If your skin becomes reddened/irritated stop using the CHG and inform your nurse when you arrive at Short Stay. Do not shave (including legs and underarms) for at least 48 hours prior to the first CHG shower.  You may shave your face/neck. Please follow these instructions carefully:  1.  Shower with CHG Soap the night before surgery and the  morning of Surgery.  2.  If you choose to wash your hair, wash your hair first as usual with your   normal  shampoo.  3.  After you shampoo, rinse your hair and body thoroughly to remove the  shampoo.                           4.  Use CHG as you would any other liquid soap.  You can apply chg directly  to the skin and wash                       Gently with a scrungie or clean washcloth.  5.  Apply the CHG Soap to your body ONLY FROM THE NECK DOWN.   Do not use on face/ open                           Wound or open sores. Avoid contact with eyes, ears mouth and genitals (private parts).                       Wash face,  Genitals (private parts) with your normal soap.             6.  Wash thoroughly, paying special attention to the area where your surgery  will be performed.  7.  Thoroughly rinse your body with warm water from the neck down.  8.  DO NOT shower/wash with your normal soap after using and rinsing off  the CHG Soap.                9.  Pat yourself dry with a clean towel.            10.  Wear clean pajamas.            11.  Place clean sheets on your bed the night of your first shower and do not  sleep with pets. Day of Surgery : Do not apply any lotions/deodorants the morning of surgery.  Please wear clean clothes to the hospital/surgery center.  FAILURE TO FOLLOW THESE INSTRUCTIONS MAY RESULT IN THE CANCELLATION OF YOUR SURGERY PATIENT SIGNATURE_________________________________  NURSE SIGNATURE__________________________________  ________________________________________________________________________   Adam [place]ix  An incentive spirometer is a tool that can help keep your lungs clear and active. This tool measures how well you are filling your lungs with each breath. Taking long deep breaths may help reverse or decrease the chance of developing breathing (pulmonary) problems (especially infection) following:  A long period of time when you are unable to move or be active. BEFORE THE PROCEDURE   If the spirometer includes an indicator to show your best effort, your  nurse or respiratory therapist will set it to a desired goal.  If possible, sit up straight or lean slightly forward. Try not to slouch.  Hold the incentive spirometer in an upright position. INSTRUCTIONS FOR USE  1. Sit on the edge of your bed if possible, or sit up as far as you can in bed or on a chair. 2. Hold the incentive spirometer in an upright position. 3. Breathe out normally. 4. Place the mouthpiece in your mouth and seal your lips tightly around it. 5. Breathe in slowly and as deeply as possible, raising the piston or the ball toward the top of the column. 6. Hold your breath for 3-5 seconds or for as long as possible. Allow the piston or ball to fall to the bottom of the column. 7. Remove the mouthpiece from your mouth and breathe out normally. 8. Rest for a few seconds and repeat Steps 1 through 7 at least 10 times every 1-2 hours when you are awake. Take your time and take a few normal breaths between deep breaths. 9. The spirometer may include an indicator to show your best effort. Use the indicator as a goal to work toward during each repetition. 10. After each set of 10 deep breaths, practice coughing to be sure your lungs are clear. If you have an incision (the cut made at the time of surgery), support your incision when coughing by placing a pillow or rolled up towels firmly against it. Once you are able to get out of bed, walk around indoors and cough well. You may stop using the incentive spirometer when instructed by your caregiver.  RISKS AND COMPLICATIONS  Take your time so you do not get dizzy or light-headed.  If you are in pain, you may need to take or ask for pain medication before doing incentive spirometry. It is harder to take a deep breath if you are having pain. AFTER USE  Rest and breathe slowly and easily.  It can be helpful to keep track of a log of your progress. Your caregiver can provide you with a simple table to help with this.  If you are using the  spirometer at home, follow these instructions: [place] IF:   You are having difficultly using the spirometer.  You have trouble using the spirometer as often as instructed.  Your pain medication is not giving enough relief while using the spirometer.  You develop fever of 100.5 F (38.1 C) or higher. SEEK IMMEDIATE MEDICAL CARE IF:   You cough up bloody sputum that had not been present before.  You develop fever of 102 F (38.9 C) or greater.  You develop worsening pain at or near the incision site. MAKE SURE YOU:   Understand these instructions.  Will watch your condition.  Will get help right away if you are not doing well or get worse. Document Released: 01/27/2007 Document Revised: 12/09/2011 Document Reviewed: 03/30/2007 ExitCare Patient Information 2014 ExitCare, [place].   ________________________________________________________________________  WHAT IS A BLOOD TRANSFUSION? Blood Transfusion Information  A transfusion is the replacement of blood or some of its parts. Blood is made up of multiple cells which provide different functions.  Red blood cells carry oxygen and are used for blood loss replacement.  White blood cells fight against infection.  Platelets control bleeding.  Plasma helps clot blood.  Other blood products are available for specialized needs, such as hemophilia or other clotting disorders. BEFORE THE TRANSFUSION  Who gives blood for transfusions?   Healthy volunteers who are fully evaluated to make sure their blood is safe. This is blood bank blood. Transfusion therapy is the safest it has ever been in the practice of medicine. Before blood is taken from a donor, a complete history is taken to make sure that person has no history of diseases nor engages in risky social behavior (examples are intravenous drug use or sexual activity with multiple partners). The donor's travel history is screened to minimize risk of transmitting  infections, such as malaria. The donated blood is tested for signs of infectious diseases, such as HIV and hepatitis. The blood is then tested to be sure it is compatible with you in order to minimize the chance of a transfusion reaction. If you or a relative donates blood, this is often done in anticipation of surgery and is not appropriate for emergency situations. It takes many days to process the donated blood. RISKS AND COMPLICATIONS Although transfusion therapy is very safe and saves many lives, the main dangers of transfusion include:   Getting an infectious disease.  Developing a transfusion reaction. This is an allergic reaction to something in the blood you were given. Every precaution is taken to prevent this. The decision to have a blood transfusion has been considered carefully by your caregiver before blood is given. Blood is not given unless the benefits outweigh the risks. AFTER THE TRANSFUSION  Right after receiving a blood transfusion, you will usually feel much better and more energetic. This is especially true if your red blood cells have gotten low (anemic). The transfusion raises the level of the red blood cells which carry oxygen, and this usually causes an energy increase.  The nurse administering the transfusion will monitor you carefully for complications. HOME CARE INSTRUCTIONS  No special instructions are needed after a transfusion. You may find your energy is better. Speak with your caregiver about any limitations on activity for underlying diseases you may have. SEEK MEDICAL CARE IF:   Your condition is not improving after your transfusion.  You develop redness or irritation at the intravenous (IV) site. SEEK IMMEDIATE MEDICAL CARE IF:  Any of  the following symptoms occur over the next 12 hours:  Shaking chills.  You have a temperature by mouth above 102 F (38.9 C), not controlled by medicine.  Chest, back, or muscle pain.  People around you feel you are  not acting correctly or are confused.  Shortness of breath or difficulty breathing.  Dizziness and fainting.  You get a rash or develop hives.  You have a decrease in urine output.  Your urine turns a dark color or changes to pink, red, or brown. Any of the following symptoms occur over the next 10 days:  You have a temperature by mouth above 102 F (38.9 C), not controlled by medicine.  Shortness of breath.  Weakness after normal activity.  The white part of the eye turns yellow (jaundice).  You have a decrease in the amount of urine or are urinating less often.  Your urine turns a dark color or changes to pink, red, or brown. Document Released: 09/13/2000 Document Revised: 12/09/2011 Document Reviewed: 05/02/2008 [facility] Patient Information 2014 [place], [place].  _______________________________________________________________________

## 2014-02-25 NOTE — Progress Notes (Signed)
Cardiac clearance note dr Percival Spanish 01-26-14 epic ekg 01-26-14 epic Chest xray 2 view 12-02-13 epic

## 2014-02-28 ENCOUNTER — Encounter (HOSPITAL_COMMUNITY)
Admission: RE | Admit: 2014-02-28 | Discharge: 2014-02-28 | Disposition: A | Discharge status: Home / Self Care | Payer: Medicare Other | Source: Ambulatory Visit | Attending: Orthopedic Surgery | Admitting: Orthopedic Surgery

## 2014-02-28 ENCOUNTER — Encounter (HOSPITAL_COMMUNITY): Payer: Self-pay

## 2014-02-28 DIAGNOSIS — Z01812 Encounter for preprocedural laboratory examination: Secondary | ICD-10-CM | POA: Insufficient documentation

## 2014-02-28 HISTORY — DX: Unspecified macular degeneration: H35.30

## 2014-02-28 HISTORY — DX: Adverse effect of unspecified anesthetic, initial encounter: T41.45XA

## 2014-02-28 LAB — CBC
HEMATOCRIT: 37.7 % (ref 36.0–46.0)
HEMOGLOBIN: 12.6 g/dL (ref 12.0–15.0)
MCH: 30 pg (ref 26.0–34.0)
MCHC: 33.4 g/dL (ref 30.0–36.0)
MCV: 89.8 fL (ref 78.0–100.0)
Platelets: 189 10*3/uL (ref 150–400)
RBC: 4.2 MIL/uL (ref 3.87–5.11)
RDW: 13.1 % (ref 11.5–15.5)
WBC: 11.1 10*3/uL — ABNORMAL HIGH (ref 4.0–10.5)

## 2014-02-28 LAB — COMPREHENSIVE METABOLIC PANEL
ALK PHOS: 37 U/L — AB (ref 39–117)
ALT: 12 U/L (ref 0–35)
AST: 14 U/L (ref 0–37)
Albumin: 3.7 g/dL (ref 3.5–5.2)
BUN: 21 mg/dL (ref 6–23)
CALCIUM: 10.1 mg/dL (ref 8.4–10.5)
CO2: 26 meq/L (ref 19–32)
Chloride: 101 mEq/L (ref 96–112)
Creatinine, Ser: 0.93 mg/dL (ref 0.50–1.10)
GFR calc Af Amer: 64 mL/min — ABNORMAL LOW (ref >90)
GFR, EST NON AFRICAN AMERICAN: 55 mL/min — AB (ref >90)
GLUCOSE: 352 mg/dL — AB (ref 70–99)
Potassium: 4.9 mEq/L (ref 3.7–5.3)
SODIUM: 139 meq/L (ref 137–147)
Total Bilirubin: 0.4 mg/dL (ref 0.3–1.2)
Total Protein: 6.5 g/dL (ref 6.0–8.3)

## 2014-02-28 LAB — URINALYSIS, ROUTINE W REFLEX MICROSCOPIC
Bilirubin Urine: NEGATIVE
Hgb urine dipstick: NEGATIVE
Ketones, ur: NEGATIVE mg/dL
Leukocytes, UA: NEGATIVE
Nitrite: NEGATIVE
Protein, ur: NEGATIVE mg/dL
Specific Gravity, Urine: 1.023 (ref 1.005–1.030)
Urobilinogen, UA: 0.2 mg/dL (ref 0.0–1.0)
pH: 5 (ref 5.0–8.0)

## 2014-02-28 LAB — SURGICAL PCR SCREEN
MRSA, PCR: NEGATIVE
Staphylococcus aureus: NEGATIVE

## 2014-02-28 LAB — PROTIME-INR
INR: 0.97 (ref 0.00–1.49)
PROTHROMBIN TIME: 12.7 s (ref 11.6–15.2)

## 2014-02-28 LAB — APTT: aPTT: 26 seconds (ref 24–37)

## 2014-02-28 LAB — URINE MICROSCOPIC-ADD ON

## 2014-02-28 NOTE — Progress Notes (Signed)
cmet results faxed to dr aluisio by epic 

## 2014-03-01 ENCOUNTER — Other Ambulatory Visit: Payer: Self-pay | Admitting: Family Medicine

## 2014-03-02 NOTE — Telephone Encounter (Signed)
ok 

## 2014-03-02 NOTE — Telephone Encounter (Signed)
Last seen 12/06/13  [facility]  If approved route to nurse to call in [facility]

## 2014-03-03 ENCOUNTER — Other Ambulatory Visit: Payer: Self-pay | Admitting: Orthopedic Surgery

## 2014-03-03 ENCOUNTER — Other Ambulatory Visit: Payer: Self-pay | Admitting: Family Medicine

## 2014-03-03 NOTE — Telephone Encounter (Signed)
Left authorization on pharmacy voicemail. 

## 2014-03-03 NOTE — H&P (Signed)
Kristy [name] DOB: 03/16/1928 Married / Language: English / Race: White Female  Date of Admission:  03-07-2014  Chief Complaint:  Right Knee Pain  History of Present Illness The patient is a 80 year old female who comes in  for a preoperative History and Physical. The patient is scheduled for a right total knee arthroplasty to be performed by Dr. Frank V. Aluisio, MD at [facility] on 03-07-2014. The patient is a 80 year old female who presents for follow up of their knee. The patient is being followed for their right knee pain and osteoarthritis. They are now weeks out from a Synvisc series. Symptoms reported today include: pain, difficulty ambulating and difficulty arising from chair, while the patient does not report symptoms of: pain at night. The patient feels that they are doing poorly. Current treatment includes: relative rest and activity modification. The patient has not gotten any relief of their symptoms with viscosupplementation. Note for "Follow-up Knee": She states the injection only helped for about a month or so. Unfortunately the right knee is starting to get as bad as the left one was prior to when it was replaced. She said the left knee is doing very well. The right knee hurts her at all times. It is limiting what she can and cannot do. She is now ready to proceed the right side. They have been treated conservatively in the past for the above stated problem and despite conservative measures, they continue to have progressive pain and severe functional limitations and dysfunction. They have failed non-operative management including home exercise, medications, and injections. It is felt that they would benefit from undergoing total joint replacement. Risks and benefits of the procedure have been discussed with the patient and they elect to proceed with surgery. There are no active contraindications to surgery such as ongoing infection or rapidly progressive  neurological disease.    Problem List Primary osteoarthritis of one knee (715.16  M17.10) S/P Left total knee arthroplasty (V43.65)   Allergies No Known Drug Allergies    Family History Father. Deceased, Cerebrovascular Accident. age 78 Mother. Deceased. age 103    Social History Current work status. retired Children. 1 Alcohol use. current drinker; drinks wine; only occasionally per week Tobacco use. Never smoker. never smoker Drug/Alcohol Rehab (Currently). no Illicit drug use. no Exercise. Exercises rarely; does running / walking Drug/Alcohol Rehab (Previously). no Marital status. married Living situation. live with spouse Tobacco / smoke exposure. no Pain Contract. no Advance Directives. Living Will, Healthcare POA Post-Surgical Plans. Plan for home.    Medication History Lisinopril (10MG Tablet, Oral) Active. (1/2 tab daily) Toprol XL Active. Furosemide (40MG Tablet, Oral as needed) Active. ALPRAZolam (0.25MG Tablet, Oral as needed) Active. Aspirin Childrens (81MG Tablet Chewable, Oral) Active. Fish Oil ( Oral) Specific dose unknown - Active. Isopto Tears Active. Pravachol 80mg Active. Zetia (10MG Tablet, Oral) Active. Fenofibrate Micronized (134MG Capsule, Oral) Active. GlyBURIDE-MetFORMIN (5-500MG Tablet, Oral) Active. Estradiol (1MG Tablet, Oral) Active.   Past Surgical History Hysterectomy. Date: 1987. complete (non-cancerous) Gallbladder Surgery. Date: 1989. open Spinal Surgery. Date: 2013. Total Knee Replacement - Left. Date: 12/2012.   Medical History Impaired Hearing Impaired Vision Diabetes Mellitus, Type II Shingles. March 2014 Hypercholesterolemia    Review of Systems General:Not Present- Chills, Fever, Night Sweats, Fatigue, Weight Gain, Weight Loss and Memory Loss. Skin:Not Present- Hives, Itching, Rash, Eczema and Lesions. HEENT:Present- Hearing Loss. Not Present- Tinnitus, Headache,  Double Vision, Visual Loss and Dentures. Respiratory:Not Present- Shortness of breath with exertion,   Shortness of breath at rest, Allergies, Coughing up blood and Chronic Cough. Cardiovascular:Not Present- Chest Pain, Racing/skipping heartbeats, Difficulty Breathing Lying Down, Murmur, Swelling and Palpitations. Gastrointestinal:Not Present- Bloody Stool, Heartburn, Abdominal Pain, Vomiting, Nausea, Constipation, Diarrhea, Difficulty Swallowing, Jaundice and Loss of appetitie. Female Genitourinary:Not Present- Blood in Urine, Urinary frequency, Weak urinary stream, Discharge, Flank Pain, Incontinence, Painful Urination, Urgency, Urinary Retention and Urinating at Night. Musculoskeletal:Present- Joint Pain. Not Present- Muscle Weakness, Muscle Pain, Joint Swelling, Back Pain, Morning Stiffness and Spasms. Neurological:Not Present- Tremor, Dizziness, Blackout spells, Paralysis, Difficulty with balance and Weakness. Psychiatric:Not Present- Insomnia.    Vitals Weight: 167 lb Height: 62.5 in Weight was reported by patient. Height was reported by patient. Body Surface Area: 1.83 m Body Mass Index: 30.06 kg/m Pulse: 68 (Regular) Resp.: 16 (Unlabored) BP: 138/62 (Sitting, Right Arm, Standard)     Physical Exam The physical exam findings are as follows:  Note: Patient is an 80 year old female with continued knee pain. Patient is accompanied today by her granddaughter Jessica.   General Mental Status - Alert, cooperative and good historian. General Appearance- pleasant. Not in acute distress. Orientation- Oriented X3. Build & Nutrition- Well nourished and Well developed.   Head and Neck Head- normocephalic, atraumatic . Neck Global Assessment- supple. no bruit auscultated on the right and no bruit auscultated on the left. bilateral hearing aids  Eye Pupil- Bilateral- Regular and Round. Motion- Bilateral- EOMI.   Chest and Lung  Exam Auscultation: Breath sounds:- clear at anterior chest wall and - clear at posterior chest wall. Adventitious sounds:- No Adventitious sounds.   Cardiovascular Auscultation:Rhythm- Regular rate and rhythm. Heart Sounds- S1 WNL and S2 WNL. Murmurs & Other Heart Sounds:Auscultation of the heart reveals - No Murmurs.   Abdomen Palpation/Percussion:Tenderness- Abdomen is non-tender to palpation. Rigidity (guarding)- Abdomen is soft. Auscultation:Auscultation of the abdomen reveals - Bowel sounds normal.   Female Genitourinary  Not done, not pertinent to present illness  Musculoskeletal On exam, she is in no distress. Her left knee shows no swelling. Her range of motion on the left knee is about 0 to 115 degrees. There is no tenderness or instability. Right knee, no effusion. There is marked crepitus on range of motion. Right knee range is about 5 to 115. She is tender medial greater than lateral. No instability noted.  RADIOGRAPHS: AP and lateral of both knees with prosthesis on the left is in excellent position. No periprosthetic abnormalities. On the right, she has bone on bone arthritis medial and patellofemoral compartments.   Assessment & Plan S/P Left total knee arthroplasty (V43.65)  Primary osteoarthritis of one knee (715.16  M17.10) Impression: Right Knee  Note: Plan is for a Right Total Knee Replacement by Dr. Aluisio.  Plan is to go home.  PCP - Dr. Don Moore  The patient does not have any contraindications and will receive TXA (tranexamic acid) prior to surgery.  Signed electronically by Alexzandrew L Perkins, III PA-C 

## 2014-03-04 NOTE — Telephone Encounter (Signed)
Patient last seen in office on 12-06-13. Rx last filled on 03-26-13 for #[phone]. Please advise. If approved please route to pool A so nurse can phone in to pharmacy

## 2014-03-05 NOTE — Telephone Encounter (Signed)
This is okay to refill 

## 2014-03-07 ENCOUNTER — Inpatient Hospital Stay (HOSPITAL_COMMUNITY)
Admission: RE | Admit: 2014-03-07 | Discharge: 2014-03-10 | DRG: 470 | Disposition: A | Discharge status: Home Health | Payer: Medicare Other | Source: Ambulatory Visit | Attending: Orthopedic Surgery | Admitting: Orthopedic Surgery

## 2014-03-07 ENCOUNTER — Inpatient Hospital Stay (HOSPITAL_COMMUNITY): Payer: Medicare Other | Admitting: Anesthesiology

## 2014-03-07 ENCOUNTER — Encounter (HOSPITAL_COMMUNITY): Payer: Medicare Other | Admitting: Anesthesiology

## 2014-03-07 ENCOUNTER — Encounter (HOSPITAL_COMMUNITY)
Admission: RE | Disposition: A | Discharge status: Home Health | Payer: Self-pay | Source: Ambulatory Visit | Attending: Orthopedic Surgery

## 2014-03-07 ENCOUNTER — Encounter (HOSPITAL_COMMUNITY): Payer: Self-pay | Admitting: *Deleted

## 2014-03-07 DIAGNOSIS — H9190 Unspecified hearing loss, unspecified ear: Secondary | ICD-10-CM | POA: Diagnosis present

## 2014-03-07 DIAGNOSIS — Z96659 Presence of unspecified artificial knee joint: Secondary | ICD-10-CM

## 2014-03-07 DIAGNOSIS — H3530 Unspecified macular degeneration: Secondary | ICD-10-CM | POA: Diagnosis present

## 2014-03-07 DIAGNOSIS — Z823 Family history of stroke: Secondary | ICD-10-CM

## 2014-03-07 DIAGNOSIS — K449 Diaphragmatic hernia without obstruction or gangrene: Secondary | ICD-10-CM | POA: Diagnosis present

## 2014-03-07 DIAGNOSIS — E7800 Pure hypercholesterolemia, unspecified: Secondary | ICD-10-CM | POA: Diagnosis present

## 2014-03-07 DIAGNOSIS — Z01812 Encounter for preprocedural laboratory examination: Secondary | ICD-10-CM

## 2014-03-07 DIAGNOSIS — Z860100 Personal history of colon polyps, unspecified: Secondary | ICD-10-CM

## 2014-03-07 DIAGNOSIS — Z981 Arthrodesis status: Secondary | ICD-10-CM

## 2014-03-07 DIAGNOSIS — Z8619 Personal history of other infectious and parasitic diseases: Secondary | ICD-10-CM

## 2014-03-07 DIAGNOSIS — M179 Osteoarthritis of knee, unspecified: Secondary | ICD-10-CM | POA: Diagnosis present

## 2014-03-07 DIAGNOSIS — C9110 Chronic lymphocytic leukemia of B-cell type not having achieved remission: Secondary | ICD-10-CM | POA: Diagnosis present

## 2014-03-07 DIAGNOSIS — I10 Essential (primary) hypertension: Secondary | ICD-10-CM | POA: Diagnosis present

## 2014-03-07 DIAGNOSIS — E119 Type 2 diabetes mellitus without complications: Secondary | ICD-10-CM | POA: Diagnosis present

## 2014-03-07 DIAGNOSIS — Z6831 Body mass index (BMI) 31.0-31.9, adult: Secondary | ICD-10-CM

## 2014-03-07 DIAGNOSIS — M1710 Unilateral primary osteoarthritis, unspecified knee: Principal | ICD-10-CM | POA: Diagnosis present

## 2014-03-07 HISTORY — PX: TOTAL KNEE ARTHROPLASTY: SHX125

## 2014-03-07 LAB — GLUCOSE, CAPILLARY
GLUCOSE-CAPILLARY: 134 mg/dL — AB (ref 70–99)
GLUCOSE-CAPILLARY: 229 mg/dL — AB (ref 70–99)
GLUCOSE-CAPILLARY: 220 mg/dL — AB (ref 70–99)
Glucose-Capillary: 132 mg/dL — ABNORMAL HIGH (ref 70–99)
Glucose-Capillary: 102 mg/dL — ABNORMAL HIGH (ref 70–99)
Glucose-Capillary: 165 mg/dL — ABNORMAL HIGH (ref 70–99)

## 2014-03-07 LAB — TYPE AND SCREEN
ABO/RH(D): A POS
Antibody Screen: NEGATIVE

## 2014-03-07 SURGERY — ARTHROPLASTY, KNEE, TOTAL
Anesthesia: Spinal | Site: Knee | Laterality: Right

## 2014-03-07 MED ORDER — MENTHOL 3 MG MT LOZG: 1.0000 | LOZENGE | OROMUCOSAL | Status: DC | PRN

## 2014-03-07 MED ORDER — DEXAMETHASONE SODIUM PHOSPHATE 10 MG/ML IJ SOLN
INTRAMUSCULAR | Status: DC | PRN
  Administered 2014-03-07: 10 mg via INTRAVENOUS

## 2014-03-07 MED ORDER — DOCUSATE SODIUM 100 MG PO CAPS
100.0000 mg | ORAL_CAPSULE | Freq: Two times a day (BID) | ORAL | Status: DC
  Administered 2014-03-07 – 2014-03-10 (×6): 100 mg via ORAL

## 2014-03-07 MED ORDER — METHOCARBAMOL 500 MG PO TABS
500.0000 mg | ORAL_TABLET | Freq: Four times a day (QID) | ORAL | Status: DC | PRN
  Administered 2014-03-09: 500 mg via ORAL
  Filled 2014-03-07: qty 1

## 2014-03-07 MED ORDER — INSULIN ASPART 100 UNIT/ML ~~LOC~~ SOLN
0.0000 [IU] | Freq: Three times a day (TID) | SUBCUTANEOUS | Status: DC
Start: 2014-03-07 — End: 2014-03-10
  Administered 2014-03-07 – 2014-03-08 (×2): 5 [IU] via SUBCUTANEOUS
  Administered 2014-03-08: 8 [IU] via SUBCUTANEOUS
  Administered 2014-03-09: 5 [IU] via SUBCUTANEOUS
  Administered 2014-03-09: 3 [IU] via SUBCUTANEOUS
  Administered 2014-03-09: 2 [IU] via SUBCUTANEOUS
  Administered 2014-03-10: 3 [IU] via SUBCUTANEOUS

## 2014-03-07 MED ORDER — FENTANYL CITRATE 0.05 MG/ML IJ SOLN
INTRAMUSCULAR | Status: DC | PRN
  Administered 2014-03-07: 25 ug via INTRAVENOUS

## 2014-03-07 MED ORDER — METOCLOPRAMIDE HCL 10 MG PO TABS: 5.0000 mg | ORAL_TABLET | Freq: Three times a day (TID) | ORAL | Status: DC | PRN

## 2014-03-07 MED ORDER — RIVAROXABAN 10 MG PO TABS
10.0000 mg | ORAL_TABLET | Freq: Every day | ORAL | Status: DC
  Administered 2014-03-08 – 2014-03-10 (×3): 10 mg via ORAL
  Filled 2014-03-07 (×4): qty 1

## 2014-03-07 MED ORDER — TRAMADOL HCL 50 MG PO TABS: 50.0000 mg | ORAL_TABLET | Freq: Four times a day (QID) | ORAL | Status: DC | PRN

## 2014-03-07 MED ORDER — METFORMIN HCL 500 MG PO TABS
500.0000 mg | ORAL_TABLET | Freq: Two times a day (BID) | ORAL | Status: DC
  Administered 2014-03-07 – 2014-03-10 (×6): 500 mg via ORAL
  Filled 2014-03-07 (×9): qty 1

## 2014-03-07 MED ORDER — PROPOFOL 10 MG/ML IV BOLUS
INTRAVENOUS | Status: AC
  Filled 2014-03-07: qty 20

## 2014-03-07 MED ORDER — ALPRAZOLAM 0.25 MG PO TABS
0.2500 mg | ORAL_TABLET | Freq: Two times a day (BID) | ORAL | Status: DC | PRN
  Administered 2014-03-07 – 2014-03-09 (×3): 0.25 mg via ORAL
  Filled 2014-03-07 (×3): qty 1

## 2014-03-07 MED ORDER — SODIUM CHLORIDE 0.9 % IV SOLN
INTRAVENOUS | Status: DC
  Administered 2014-03-07 – 2014-03-08 (×2): via INTRAVENOUS

## 2014-03-07 MED ORDER — ACETAMINOPHEN 650 MG RE SUPP: 650.0000 mg | Freq: Four times a day (QID) | RECTAL | Status: DC | PRN

## 2014-03-07 MED ORDER — LIDOCAINE HCL (CARDIAC) 20 MG/ML IV SOLN
INTRAVENOUS | Status: AC
  Filled 2014-03-07: qty 5

## 2014-03-07 MED ORDER — SODIUM CHLORIDE 0.9 % IV SOLN: INTRAVENOUS | Status: DC

## 2014-03-07 MED ORDER — SODIUM CHLORIDE 0.9 % IJ SOLN
INTRAMUSCULAR | Status: AC
  Filled 2014-03-07: qty 50

## 2014-03-07 MED ORDER — LIDOCAINE HCL (CARDIAC) 20 MG/ML IV SOLN
INTRAVENOUS | Status: DC | PRN
  Administered 2014-03-07: 50 mg via INTRAVENOUS

## 2014-03-07 MED ORDER — PANTOPRAZOLE SODIUM 40 MG PO TBEC
80.0000 mg | DELAYED_RELEASE_TABLET | Freq: Every day | ORAL | Status: DC
  Administered 2014-03-08 – 2014-03-09 (×2): 80 mg via ORAL
  Filled 2014-03-07 (×3): qty 2

## 2014-03-07 MED ORDER — DEXAMETHASONE SODIUM PHOSPHATE 10 MG/ML IJ SOLN: 10.0000 mg | Freq: Once | INTRAMUSCULAR | Status: DC

## 2014-03-07 MED ORDER — BUPIVACAINE LIPOSOME 1.3 % IJ SUSP
20.0000 mL | Freq: Once | INTRAMUSCULAR | Status: DC
  Filled 2014-03-07: qty 20

## 2014-03-07 MED ORDER — METOCLOPRAMIDE HCL 5 MG/ML IJ SOLN: 5.0000 mg | Freq: Three times a day (TID) | INTRAMUSCULAR | Status: DC | PRN

## 2014-03-07 MED ORDER — BUPIVACAINE LIPOSOME 1.3 % IJ SUSP
INTRAMUSCULAR | Status: DC | PRN
  Administered 2014-03-07: 20 mL

## 2014-03-07 MED ORDER — PROPOFOL INFUSION 10 MG/ML OPTIME
INTRAVENOUS | Status: DC | PRN
  Administered 2014-03-07: 25 ug/kg/min via INTRAVENOUS

## 2014-03-07 MED ORDER — ROCURONIUM BROMIDE 100 MG/10ML IV SOLN
INTRAVENOUS | Status: AC
  Filled 2014-03-07: qty 1

## 2014-03-07 MED ORDER — LACTATED RINGERS IV SOLN
INTRAVENOUS | Status: DC
  Administered 2014-03-07: 1000 mL via INTRAVENOUS

## 2014-03-07 MED ORDER — SODIUM CHLORIDE 0.9 % IR SOLN
Status: DC | PRN
  Administered 2014-03-07: 1000 mL

## 2014-03-07 MED ORDER — CEFAZOLIN SODIUM-DEXTROSE 2-3 GM-% IV SOLR
2.0000 g | Freq: Four times a day (QID) | INTRAVENOUS | Status: AC
  Administered 2014-03-07 (×2): 2 g via INTRAVENOUS
  Filled 2014-03-07 (×2): qty 50

## 2014-03-07 MED ORDER — METHOCARBAMOL 1000 MG/10ML IJ SOLN
500.0000 mg | Freq: Four times a day (QID) | INTRAMUSCULAR | Status: DC | PRN
  Filled 2014-03-07: qty 5

## 2014-03-07 MED ORDER — POLYVINYL ALCOHOL 1.4 % OP SOLN
1.0000 [drp] | Freq: Two times a day (BID) | OPHTHALMIC | Status: DC | PRN
  Filled 2014-03-07: qty 15

## 2014-03-07 MED ORDER — EZETIMIBE 10 MG PO TABS
5.0000 mg | ORAL_TABLET | Freq: Every day | ORAL | Status: DC
  Administered 2014-03-07 – 2014-03-10 (×4): 5 mg via ORAL
  Filled 2014-03-07 (×4): qty 0.5

## 2014-03-07 MED ORDER — LACTATED RINGERS IV SOLN
INTRAVENOUS | Status: DC | PRN
  Administered 2014-03-07 (×2): via INTRAVENOUS

## 2014-03-07 MED ORDER — ACETAMINOPHEN 325 MG PO TABS
650.0000 mg | ORAL_TABLET | Freq: Four times a day (QID) | ORAL | Status: DC | PRN
Start: 2014-03-08 — End: 2014-03-10
  Administered 2014-03-09: 650 mg via ORAL
  Filled 2014-03-07: qty 2

## 2014-03-07 MED ORDER — LACTATED RINGERS IV SOLN
INTRAVENOUS | Status: DC
Start: 2014-03-07 — End: 2014-03-07

## 2014-03-07 MED ORDER — ACETAMINOPHEN 10 MG/ML IV SOLN
1000.0000 mg | Freq: Once | INTRAVENOUS | Status: AC
  Administered 2014-03-07: 1000 mg via INTRAVENOUS
  Filled 2014-03-07: qty 100

## 2014-03-07 MED ORDER — GLYBURIDE-METFORMIN 5-500 MG PO TABS: 1.0000 | ORAL_TABLET | Freq: Two times a day (BID) | ORAL | Status: DC

## 2014-03-07 MED ORDER — FUROSEMIDE 40 MG PO TABS
40.0000 mg | ORAL_TABLET | Freq: Two times a day (BID) | ORAL | Status: DC | PRN
  Filled 2014-03-07: qty 1

## 2014-03-07 MED ORDER — BUPIVACAINE HCL (PF) 0.25 % IJ SOLN
INTRAMUSCULAR | Status: AC
  Filled 2014-03-07: qty 30

## 2014-03-07 MED ORDER — SODIUM CHLORIDE 0.9 % IJ SOLN
INTRAMUSCULAR | Status: DC | PRN
  Administered 2014-03-07: 30 mL

## 2014-03-07 MED ORDER — TRANEXAMIC ACID 100 MG/ML IV SOLN
1000.0000 mg | INTRAVENOUS | Status: AC
  Administered 2014-03-07: 1000 mg via INTRAVENOUS
  Filled 2014-03-07: qty 10

## 2014-03-07 MED ORDER — CEFAZOLIN SODIUM-DEXTROSE 2-3 GM-% IV SOLR
INTRAVENOUS | Status: AC
  Filled 2014-03-07: qty 50

## 2014-03-07 MED ORDER — BISACODYL 10 MG RE SUPP: 10.0000 mg | Freq: Every day | RECTAL | Status: DC | PRN

## 2014-03-07 MED ORDER — GLYBURIDE 5 MG PO TABS
5.0000 mg | ORAL_TABLET | Freq: Two times a day (BID) | ORAL | Status: DC
  Administered 2014-03-07 – 2014-03-10 (×6): 5 mg via ORAL
  Filled 2014-03-07 (×8): qty 1

## 2014-03-07 MED ORDER — ONDANSETRON HCL 4 MG/2ML IJ SOLN
4.0000 mg | Freq: Four times a day (QID) | INTRAMUSCULAR | Status: DC | PRN
Start: 2014-03-07 — End: 2014-03-10

## 2014-03-07 MED ORDER — DEXAMETHASONE SODIUM PHOSPHATE 10 MG/ML IJ SOLN
10.0000 mg | Freq: Every day | INTRAMUSCULAR | Status: AC
  Filled 2014-03-07: qty 1

## 2014-03-07 MED ORDER — DEXAMETHASONE 6 MG PO TABS
10.0000 mg | ORAL_TABLET | Freq: Every day | ORAL | Status: AC
  Administered 2014-03-08: 10 mg via ORAL
  Filled 2014-03-07: qty 1

## 2014-03-07 MED ORDER — OXYCODONE HCL 5 MG PO TABS
5.0000 mg | ORAL_TABLET | ORAL | Status: DC | PRN
  Administered 2014-03-07 – 2014-03-10 (×14): 10 mg via ORAL
  Filled 2014-03-07 (×13): qty 2

## 2014-03-07 MED ORDER — METOPROLOL SUCCINATE ER 25 MG PO TB24
25.0000 mg | ORAL_TABLET | Freq: Every morning | ORAL | Status: DC
  Administered 2014-03-08 – 2014-03-10 (×3): 25 mg via ORAL
  Filled 2014-03-07 (×3): qty 1

## 2014-03-07 MED ORDER — ONDANSETRON HCL 4 MG PO TABS: 4.0000 mg | ORAL_TABLET | Freq: Four times a day (QID) | ORAL | Status: DC | PRN

## 2014-03-07 MED ORDER — HYDROMORPHONE HCL PF 1 MG/ML IJ SOLN: 0.2500 mg | INTRAMUSCULAR | Status: DC | PRN

## 2014-03-07 MED ORDER — CEFAZOLIN SODIUM-DEXTROSE 2-3 GM-% IV SOLR
2.0000 g | INTRAVENOUS | Status: AC
  Administered 2014-03-07: 2 g via INTRAVENOUS

## 2014-03-07 MED ORDER — BUPIVACAINE HCL 0.25 % IJ SOLN
INTRAMUSCULAR | Status: DC | PRN
  Administered 2014-03-07: 20 mL

## 2014-03-07 MED ORDER — MORPHINE SULFATE 2 MG/ML IJ SOLN
1.0000 mg | INTRAMUSCULAR | Status: DC | PRN
  Administered 2014-03-07 (×2): 2 mg via INTRAVENOUS
  Filled 2014-03-07 (×3): qty 1

## 2014-03-07 MED ORDER — MIDAZOLAM HCL 2 MG/2ML IJ SOLN
INTRAMUSCULAR | Status: AC
  Filled 2014-03-07: qty 2

## 2014-03-07 MED ORDER — 0.9 % SODIUM CHLORIDE (POUR BTL) OPTIME
TOPICAL | Status: DC | PRN
  Administered 2014-03-07: 1000 mL

## 2014-03-07 MED ORDER — PROPOFOL 10 MG/ML IV BOLUS
INTRAVENOUS | Status: DC | PRN
  Administered 2014-03-07: 10 mg via INTRAVENOUS

## 2014-03-07 MED ORDER — ONDANSETRON HCL 4 MG/2ML IJ SOLN
INTRAMUSCULAR | Status: AC
  Filled 2014-03-07: qty 2

## 2014-03-07 MED ORDER — POLYETHYLENE GLYCOL 3350 17 G PO PACK
17.0000 g | PACK | Freq: Every day | ORAL | Status: DC | PRN
  Administered 2014-03-09: 17 g via ORAL

## 2014-03-07 MED ORDER — ONDANSETRON HCL 4 MG/2ML IJ SOLN
INTRAMUSCULAR | Status: DC | PRN
  Administered 2014-03-07: 4 mg via INTRAVENOUS

## 2014-03-07 MED ORDER — FENTANYL CITRATE 0.05 MG/ML IJ SOLN
INTRAMUSCULAR | Status: AC
  Filled 2014-03-07: qty 5

## 2014-03-07 MED ORDER — ACETAMINOPHEN 500 MG PO TABS
1000.0000 mg | ORAL_TABLET | Freq: Four times a day (QID) | ORAL | Status: AC
  Administered 2014-03-07 – 2014-03-08 (×4): 1000 mg via ORAL
  Filled 2014-03-07 (×5): qty 2

## 2014-03-07 MED ORDER — FLEET ENEMA 7-19 GM/118ML RE ENEM: 1.0000 | ENEMA | Freq: Once | RECTAL | Status: AC | PRN

## 2014-03-07 MED ORDER — PHENOL 1.4 % MT LIQD: 1.0000 | OROMUCOSAL | Status: DC | PRN

## 2014-03-07 MED ORDER — MIDAZOLAM HCL 5 MG/5ML IJ SOLN
INTRAMUSCULAR | Status: DC | PRN
  Administered 2014-03-07 (×2): 0.5 mg via INTRAVENOUS

## 2014-03-07 MED ORDER — HYPROMELLOSE (GONIOSCOPIC) 2.5 % OP SOLN: 1.0000 [drp] | Freq: Two times a day (BID) | OPHTHALMIC | Status: DC | PRN

## 2014-03-07 MED ORDER — DIPHENHYDRAMINE HCL 12.5 MG/5ML PO ELIX: 12.5000 mg | ORAL_SOLUTION | ORAL | Status: DC | PRN

## 2014-03-07 SURGICAL SUPPLY — 59 items
BAG SPEC THK2 15X12 ZIP CLS (MISCELLANEOUS)
BAG ZIPLOCK 12X15 (MISCELLANEOUS) ×1 IMPLANT
BANDAGE ELASTIC 6 VELCRO ST LF (GAUZE/BANDAGES/DRESSINGS) ×3 IMPLANT
BANDAGE ESMARK 6X9 LF (GAUZE/BANDAGES/DRESSINGS) ×1 IMPLANT
BLADE SAG 18X100X1.27 (BLADE) ×3 IMPLANT
BLADE SAW SGTL 11.0X1.19X90.0M (BLADE) ×3 IMPLANT
BNDG CMPR 9X6 STRL LF SNTH (GAUZE/BANDAGES/DRESSINGS) ×1
BNDG ESMARK 6X9 LF (GAUZE/BANDAGES/DRESSINGS) ×3
BOWL SMART MIX CTS (DISPOSABLE) ×3 IMPLANT
CAPT RP KNEE ×2 IMPLANT
CEMENT HV SMART SET (Cement) ×6 IMPLANT
CLOSURE WOUND 1/2 X4 (GAUZE/BANDAGES/DRESSINGS) ×2
CUFF TOURN SGL QUICK 34 (TOURNIQUET CUFF) ×3
CUFF TRNQT CYL 34X4X40X1 (TOURNIQUET CUFF) ×1 IMPLANT
DECANTER SPIKE VIAL GLASS SM (MISCELLANEOUS) ×3 IMPLANT
DRAPE EXTREMITY T 121X128X90 (DRAPE) ×3 IMPLANT
DRAPE POUCH INSTRU U-SHP 10X18 (DRAPES) ×3 IMPLANT
DRAPE U-SHAPE 47X51 STRL (DRAPES) ×3 IMPLANT
DRSG ADAPTIC 3X8 NADH LF (GAUZE/BANDAGES/DRESSINGS) ×3 IMPLANT
DRSG PAD ABDOMINAL 8X10 ST (GAUZE/BANDAGES/DRESSINGS) ×3 IMPLANT
DURAPREP 26ML APPLICATOR (WOUND CARE) ×3 IMPLANT
ELECT REM PT RETURN 9FT ADLT (ELECTROSURGICAL) ×3
ELECTRODE REM PT RTRN 9FT ADLT (ELECTROSURGICAL) ×1 IMPLANT
EVACUATOR 1/8 PVC DRAIN (DRAIN) ×3 IMPLANT
FACESHIELD WRAPAROUND (MASK) ×15 IMPLANT
FACESHIELD WRAPAROUND OR TEAM (MASK) ×5 IMPLANT
GLOVE BIO SURGEON STRL SZ8 (GLOVE) ×3 IMPLANT
GLOVE BIOGEL PI IND STRL 8 (GLOVE) ×1 IMPLANT
GLOVE BIOGEL PI INDICATOR 8 (GLOVE) ×4
GLOVE ECLIPSE 8.0 STRL XLNG CF (GLOVE) ×2 IMPLANT
GLOVE SURG SS PI 6.5 STRL IVOR (GLOVE) ×2 IMPLANT
GLOVE SURG SS PI 7.0 STRL IVOR (GLOVE) ×4 IMPLANT
GOWN STRL REUS W/TWL LRG LVL3 (GOWN DISPOSABLE) ×5 IMPLANT
GOWN STRL REUS W/TWL XL LVL3 (GOWN DISPOSABLE) ×4 IMPLANT
HANDPIECE INTERPULSE COAX TIP (DISPOSABLE) ×3
IMMOBILIZER KNEE 20 (SOFTGOODS) ×6 IMPLANT
IMMOBILIZER KNEE 20 THIGH 36 (SOFTGOODS) IMPLANT
KIT BASIN OR (CUSTOM PROCEDURE TRAY) ×3 IMPLANT
MANIFOLD NEPTUNE II (INSTRUMENTS) ×3 IMPLANT
NDL SAFETY ECLIPSE 18X1.5 (NEEDLE) ×2 IMPLANT
NEEDLE HYPO 18GX1.5 SHARP (NEEDLE) ×6
PACK TOTAL JOINT (CUSTOM PROCEDURE TRAY) ×3 IMPLANT
PADDING CAST COTTON 6X4 STRL (CAST SUPPLIES) ×9 IMPLANT
POSITIONER SURGICAL ARM (MISCELLANEOUS) ×3 IMPLANT
SET HNDPC FAN SPRY TIP SCT (DISPOSABLE) ×1 IMPLANT
SPONGE GAUZE 4X4 12PLY (GAUZE/BANDAGES/DRESSINGS) ×3 IMPLANT
STRIP CLOSURE SKIN 1/2X4 (GAUZE/BANDAGES/DRESSINGS) ×4 IMPLANT
SUCTION FRAZIER 12FR DISP (SUCTIONS) ×3 IMPLANT
SUT MNCRL AB 4-0 PS2 18 (SUTURE) ×3 IMPLANT
SUT VIC AB 2-0 CT1 27 (SUTURE) ×9
SUT VIC AB 2-0 CT1 TAPERPNT 27 (SUTURE) ×3 IMPLANT
SUT VLOC 180 0 24IN GS25 (SUTURE) ×3 IMPLANT
SYR 20CC LL (SYRINGE) ×3 IMPLANT
SYR 50ML LL SCALE MARK (SYRINGE) ×3 IMPLANT
TOWEL OR 17X26 10 PK STRL BLUE (TOWEL DISPOSABLE) ×3 IMPLANT
TOWEL OR NON WOVEN STRL DISP B (DISPOSABLE) ×2 IMPLANT
TRAY FOLEY CATH 14FRSI W/METER (CATHETERS) ×3 IMPLANT
WATER STERILE IRR 1500ML POUR (IV SOLUTION) ×3 IMPLANT
WRAP KNEE MAXI GEL POST OP (GAUZE/BANDAGES/DRESSINGS) ×3 IMPLANT

## 2014-03-07 NOTE — Progress Notes (Signed)
Utilization review completed.  

## 2014-03-07 NOTE — Anesthesia Procedure Notes (Signed)
Spinal  Patient location during procedure: OR Start time: 03/07/2014 9:30 AM End time: 03/07/2014 9:35 AM Staffing Anesthesiologist: Rod Mae L Performed by: anesthesiologist  Preanesthetic Checklist Completed: patient identified, site marked, surgical consent, pre-op evaluation, timeout performed, IV checked, risks and benefits discussed and monitors and equipment checked Spinal Block Patient position: sitting Prep: Betadine Patient monitoring: heart rate, continuous pulse ox and blood pressure Approach: midline Location: L2-3 Injection technique: single-shot Needle Needle type: Spinocan  Needle gauge: 22 G Needle length: 9 cm Assessment Sensory level: T6 Additional Notes Expiration date of kit checked and confirmed. Patient tolerated procedure well, without complications.

## 2014-03-07 NOTE — Transfer of Care (Signed)
Immediate Anesthesia Transfer of Care Note  Patient: Kristy [name]  Procedure(s) Performed: Procedure(s): RIGHT TOTAL KNEE ARTHROPLASTY (Right)  Patient Location: PACU  Anesthesia Type:Spinal  Level of Consciousness: awake, alert , oriented and patient cooperative  Airway & Oxygen Therapy: Patient Spontanous Breathing and Patient connected to face mask oxygen  Post-op Assessment: Report given to PACU RN and Post -op Vital signs reviewed and stable  Post vital signs: Reviewed and stable  Complications: No apparent anesthesia complications

## 2014-03-07 NOTE — H&P (View-Only) (Signed)
Kristy [name] DOB: 06/28/1928 Married / Language: English / Race: White Female  Date of Admission:  03-07-2014  Chief Complaint:  Right Knee Pain  History of Present Illness The patient is a 80 year old female who comes in  for a preoperative History and Physical. The patient is scheduled for a right total knee arthroplasty to be performed by Dr. Dione Plover. Aluisio, MD at [facility] on 03-07-2014. The patient is a 80 year old female who presents for follow up of their knee. The patient is being followed for their right knee pain and osteoarthritis. They are now weeks out from a Synvisc series. Symptoms reported today include: pain, difficulty ambulating and difficulty arising from chair, while the patient does not report symptoms of: pain at night. The patient feels that they are doing poorly. Current treatment includes: relative rest and activity modification. The patient has not gotten any relief of their symptoms with viscosupplementation. Note for "Follow-up Knee": She states the injection only helped for about a month or so. Unfortunately the right knee is starting to get as bad as the left one was prior to when it was replaced. She said the left knee is doing very well. The right knee hurts her at all times. It is limiting what she can and cannot do. She is now ready to proceed the right side. They have been treated conservatively in the past for the above stated problem and despite conservative measures, they continue to have progressive pain and severe functional limitations and dysfunction. They have failed non-operative management including home exercise, medications, and injections. It is felt that they would benefit from undergoing total joint replacement. Risks and benefits of the procedure have been discussed with the patient and they elect to proceed with surgery. There are no active contraindications to surgery such as ongoing infection or rapidly progressive  neurological disease.    Problem List Primary osteoarthritis of one knee (715.16  M17.10) S/P Left total knee arthroplasty (V43.65)   Allergies No Known Drug Allergies    Family History Father. Deceased, Cerebrovascular Accident. age 6 Mother. Deceased. age 21    Social History Current work status. retired Careers information officer. 1 Alcohol use. current drinker; drinks wine; only occasionally per week Tobacco use. Never smoker. never smoker Drug/Alcohol Rehab (Currently). no Illicit drug use. no Exercise. Exercises rarely; does running / walking Drug/Alcohol Rehab (Previously). no Marital status. married Living situation. live with spouse Tobacco / smoke exposure. no Pain Contract. no Advance Directives. Living Will, Healthcare POA Post-Surgical Plans. Plan for home.    Medication History Lisinopril (10MG  Tablet, Oral) Active. (1/2 tab daily) Toprol XL Active. Furosemide (40MG  Tablet, Oral as needed) Active. ALPRAZolam (0.25MG  Tablet, Oral as needed) Active. Aspirin Childrens (81MG  Tablet Chewable, Oral) Active. Fish Oil ( Oral) Specific dose unknown - Active. Isopto Tears Active. Pravachol 80mg  Active. Zetia (10MG  Tablet, Oral) Active. Fenofibrate Micronized (134MG  Capsule, Oral) Active. GlyBURIDE-MetFORMIN (5-500MG  Tablet, Oral) Active. Estradiol (1MG  Tablet, Oral) Active.   Past Surgical History Hysterectomy. Date: 43. complete (non-cancerous) Gallbladder Surgery. Date: 2. open Spinal Surgery. Date: 2013. Total Knee Replacement - Left. Date: 12/2012.   Medical History Impaired Hearing Impaired Vision Diabetes Mellitus, Type II Shingles. March 2014 Hypercholesterolemia    Review of Systems General:Not Present- Chills, Fever, Night Sweats, Fatigue, Weight Gain, Weight Loss and Memory Loss. Skin:Not Present- Hives, Itching, Rash, Eczema and Lesions. HEENT:Present- Hearing Loss. Not Present- Tinnitus, Headache,  Double Vision, Visual Loss and Dentures. Respiratory:Not Present- Shortness of breath with exertion,  Shortness of breath at rest, Allergies, Coughing up blood and Chronic Cough. Cardiovascular:Not Present- Chest Pain, Racing/skipping heartbeats, Difficulty Breathing Lying Down, Murmur, Swelling and Palpitations. Gastrointestinal:Not Present- Bloody Stool, Heartburn, Abdominal Pain, Vomiting, Nausea, Constipation, Diarrhea, Difficulty Swallowing, Jaundice and Loss of appetitie. Female Genitourinary:Not Present- Blood in Urine, Urinary frequency, Weak urinary stream, Discharge, Flank Pain, Incontinence, Painful Urination, Urgency, Urinary Retention and Urinating at Night. Musculoskeletal:Present- Joint Pain. Not Present- Muscle Weakness, Muscle Pain, Joint Swelling, Back Pain, Morning Stiffness and Spasms. Neurological:Not Present- Tremor, Dizziness, Blackout spells, Paralysis, Difficulty with balance and Weakness. Psychiatric:Not Present- Insomnia.    Vitals Weight: 167 lb Height: 62.5 in Weight was reported by patient. Height was reported by patient. Body Surface Area: 1.83 m Body Mass Index: 30.06 kg/m Pulse: 68 (Regular) Resp.: 16 (Unlabored) BP: 138/62 (Sitting, Right Arm, Standard)     Physical Exam The physical exam findings are as follows:  Note: Patient is an 80 year old female with continued knee pain. Patient is accompanied today by her granddaughter Kristy [name].   General Mental Status - Alert, cooperative and good historian. General Appearance- pleasant. Not in acute distress. Orientation- Oriented X3. Build & Nutrition- Well nourished and Well developed.   Head and Neck Head- normocephalic, atraumatic . Neck Global Assessment- supple. no bruit auscultated on the right and no bruit auscultated on the left. bilateral hearing aids  Eye Pupil- Bilateral- Regular and Round. Motion- Bilateral- EOMI.   Chest and Lung  Exam Auscultation: Breath sounds:- clear at anterior chest wall and - clear at posterior chest wall. Adventitious sounds:- No Adventitious sounds.   Cardiovascular Auscultation:Rhythm- Regular rate and rhythm. Heart Sounds- S1 WNL and S2 WNL. Murmurs & Other Heart Sounds:Auscultation of the heart reveals - No Murmurs.   Abdomen Palpation/Percussion:Tenderness- Abdomen is non-tender to palpation. Rigidity (guarding)- Abdomen is soft. Auscultation:Auscultation of the abdomen reveals - Bowel sounds normal.   Female Genitourinary  Not done, not pertinent to present illness  Musculoskeletal On exam, she is in no distress. Her left knee shows no swelling. Her range of motion on the left knee is about 0 to 115 degrees. There is no tenderness or instability. Right knee, no effusion. There is marked crepitus on range of motion. Right knee range is about 5 to 115. She is tender medial greater than lateral. No instability noted.  RADIOGRAPHS: AP and lateral of both knees with prosthesis on the left is in excellent position. No periprosthetic abnormalities. On the right, she has bone on bone arthritis medial and patellofemoral compartments.   Assessment & Plan S/P Left total knee arthroplasty (V43.65)  Primary osteoarthritis of one knee (715.16  M17.10) Impression: Right Knee  Note: Plan is for a Right Total Knee Replacement by Dr. Wynelle Link.  Plan is to go home.  PCP - Dr. Morrie Sheldon  The patient does not have any contraindications and will receive TXA (tranexamic acid) prior to surgery.  Signed electronically by Joelene Millin, III PA-C

## 2014-03-07 NOTE — Interval H&P Note (Signed)
History and Physical Interval Note:  03/07/2014 7:03 AM  Kristy [name]  has presented today for surgery, with the diagnosis of OSTEOARTHRITIS RIGHT KNEE  The various methods of treatment have been discussed with the patient and family. After consideration of risks, benefits and other options for treatment, the patient has consented to  Procedure(s): RIGHT TOTAL KNEE ARTHROPLASTY (Right) as a surgical intervention .  The patient's history has been reviewed, patient examined, no change in status, stable for surgery.  I have reviewed the patient's chart and labs.  Questions were answered to the patient's satisfaction.     Pilar Plate Mirta Mally V

## 2014-03-07 NOTE — Op Note (Signed)
Pre-operative diagnosis- Osteoarthritis  Right knee(s)  Post-operative diagnosis- Osteoarthritis Right knee(s)  Procedure-  Right  Total Knee Arthroplasty  Surgeon- Dione Plover. Llewyn Heap, MD  Assistant- Molli Barrows, PA-C   Anesthesia-  Spinal  EBL-* No blood loss amount entered *   Drains Hemovac  Tourniquet time-  Total Tourniquet Time Documented: Thigh (Right) - 31 minutes Total: Thigh (Right) - 31 minutes     Complications- None  Condition-PACU - hemodynamically stable.   Brief Clinical Note  Kristy [name] is a 80 y.o. year old female with end stage OA of her right knee with progressively worsening pain and dysfunction. She has constant pain, with activity and at rest and significant functional deficits with difficulties even with ADLs. She has had extensive non-op management including analgesics, injections of cortisone and viscosupplements, and home exercise program, but remains in significant pain with significant dysfunction.Radiographs show bone on bone arthritis medial and patellofemoral. She presents now for right Total Knee Arthroplasty.    Procedure in detail---   The patient is brought into the operating room and positioned supine on the operating table. After successful administration of  Spinal,   a tourniquet is placed high on the  Right thigh(s) and the lower extremity is prepped and draped in the usual sterile fashion. Time out is performed by the operating team and then the  Right lower extremity is wrapped in Esmarch, knee flexed and the tourniquet inflated to 300 mmHg.       A midline incision is made with a ten blade through the subcutaneous tissue to the level of the extensor mechanism. A fresh blade is used to make a medial parapatellar arthrotomy. Soft tissue over the proximal medial tibia is subperiosteally elevated to the joint line with a knife and into the semimembranosus bursa with a Cobb elevator. Soft tissue over the proximal lateral tibia is elevated with  attention being paid to avoiding the patellar tendon on the tibial tubercle. The patella is everted, knee flexed 90 degrees and the ACL and PCL are removed. Findings are bone on bone medial and patellofemoral with large medial osteophytes.        The drill is used to create a starting hole in the distal femur and the canal is thoroughly irrigated with sterile saline to remove the fatty contents. The 5 degree Right  valgus alignment guide is placed into the femoral canal and the distal femoral cutting block is pinned to remove 10 mm off the distal femur. Resection is made with an oscillating saw.      The tibia is subluxed forward and the menisci are removed. The extramedullary alignment guide is placed referencing proximally at the medial aspect of the tibial tubercle and distally along the second metatarsal axis and tibial crest. The block is pinned to remove 1mm off the more deficient medial  side. Resection is made with an oscillating saw. Size 2.5is the most appropriate size for the tibia and the proximal tibia is prepared with the modular drill and keel punch for that size.      The femoral sizing guide is placed and size 3 is most appropriate. Rotation is marked off the epicondylar axis and confirmed by creating a rectangular flexion gap at 90 degrees. The size 3 cutting block is pinned in this rotation and the anterior, posterior and chamfer cuts are made with the oscillating saw. The intercondylar block is then placed and that cut is made.      Trial size 2.5 tibial component, trial  size 3 posterior stabilized femur and a 10  mm posterior stabilized rotating platform insert trial is placed. Full extension is achieved with excellent varus/valgus and anterior/posterior balance throughout full range of motion. The patella is everted and thickness measured to be 22  mm. Free hand resection is taken to 12 mm, a 35 template is placed, lug holes are drilled, trial patella is placed, and it tracks normally.  Osteophytes are removed off the posterior femur with the trial in place. All trials are removed and the cut bone surfaces prepared with pulsatile lavage. Cement is mixed and once ready for implantation, the size 2.5 tibial implant, size  3 posterior stabilized femoral component, and the size 35 patella are cemented in place and the patella is held with the clamp. The trial insert is placed and the knee held in full extension. The Exparel (20 ml mixed with 30 ml saline) and .25% Bupivicaine, are injected into the extensor mechanism, posterior capsule, medial and lateral gutters and subcutaneous tissues.  All extruded cement is removed and once the cement is hard the permanent 10 mm posterior stabilized rotating platform insert is placed into the tibial tray.      The wound is copiously irrigated with saline solution and the extensor mechanism closed over a hemovac drain with #1 V-loc suture. The tourniquet is released for a total tourniquet time of 30  minutes. Flexion against gravity is 135 degrees and the patella tracks normally. Subcutaneous tissue is closed with 2.0 vicryl and subcuticular with running 4.0 Monocryl. The incision is cleaned and dried and steri-strips and a bulky sterile dressing are applied. The limb is placed into a knee immobilizer and the patient is awakened and transported to recovery in stable condition.      Please note that a surgical assistant was a medical necessity for this procedure in order to perform it in a safe and expeditious manner. Surgical assistant was necessary to retract the ligaments and vital neurovascular structures to prevent injury to them and also necessary for proper positioning of the limb to allow for anatomic placement of the prosthesis.   Dione Plover Gabriellia Rempel, MD    03/07/2014, 10:31 AM

## 2014-03-07 NOTE — Anesthesia Postprocedure Evaluation (Signed)
  Anesthesia Post-op Note  Patient: Kristy [name]  Procedure(s) Performed: Procedure(s) (LRB): RIGHT TOTAL KNEE ARTHROPLASTY (Right)  Patient Location: PACU  Anesthesia Type: Spinal  Level of Consciousness: awake and alert   Airway and Oxygen Therapy: Patient Spontanous Breathing  Post-op Pain: mild  Post-op Assessment: Post-op Vital signs reviewed, Patient's Cardiovascular Status Stable, Respiratory Function Stable, Patent Airway and No signs of Nausea or vomiting  Last Vitals:  Filed Vitals:   03/07/14 1214  BP: 125/72  Pulse: 60  Temp: 36.4 C  Resp: 14    Post-op Vital Signs: stable   Complications: No apparent anesthesia complications

## 2014-03-07 NOTE — Progress Notes (Signed)
PT NOTE  Pt declined PT POD 0. Will check back in am. Thanks.  Weston Anna, PT [phone]

## 2014-03-07 NOTE — Anesthesia Preprocedure Evaluation (Addendum)
Anesthesia Evaluation  Patient identified by MRN, date of birth, ID band Patient awake    Reviewed: Allergy & Precautions, H&P , NPO status , Patient's Chart, lab work & pertinent test results, reviewed documented beta blocker date and time   History of Anesthesia Complications (+) PROLONGED EMERGENCE  Airway Mallampati: II TM Distance: >3 FB Neck ROM: full    Dental  (+) Caps, Dental Advisory Given All front teeth are capped:   Pulmonary neg pulmonary ROS,  breath sounds clear to auscultation  Pulmonary exam normal       Cardiovascular hypertension, Pt. on medications and Pt. on home beta blockers Rhythm:regular Rate:Normal     Neuro/Psych Lumbar fusion negative neurological ROS  negative psych ROS   GI/Hepatic negative GI ROS, Neg liver ROS, hiatal hernia,   Endo/Other  diabetes, Well Controlled, Type 2, Oral Hypoglycemic Agents  Renal/GU negative Renal ROS  negative genitourinary   Musculoskeletal   Abdominal   Peds  Hematology negative hematology ROS (+)   Anesthesia Other Findings Chronic lymphocytic leukemia  Reproductive/Obstetrics negative OB ROS                          Anesthesia Physical Anesthesia Plan  ASA: III  Anesthesia Plan: Spinal   Post-op Pain Management:    Induction:   Airway Management Planned:   Additional Equipment:   Intra-op Plan:   Post-operative Plan:   Informed Consent: I have reviewed the patients History and Physical, chart, labs and discussed the procedure including the risks, benefits and alternatives for the proposed anesthesia with the patient or authorized representative who has indicated his/her understanding and acceptance.   Dental Advisory Given  Plan Discussed with: CRNA and Surgeon  Anesthesia Plan Comments: (Told patient there is a good chance spinal will not work well enough to avoid GA due to fusion and scar tissue but she is  eager to try it.)       Anesthesia Quick Evaluation

## 2014-03-07 NOTE — Telephone Encounter (Signed)
Called in.

## 2014-03-08 LAB — GLUCOSE, CAPILLARY
GLUCOSE-CAPILLARY: 246 mg/dL — AB (ref 70–99)
Glucose-Capillary: 148 mg/dL — ABNORMAL HIGH (ref 70–99)
Glucose-Capillary: 237 mg/dL — ABNORMAL HIGH (ref 70–99)
Glucose-Capillary: 282 mg/dL — ABNORMAL HIGH (ref 70–99)

## 2014-03-08 LAB — BASIC METABOLIC PANEL
BUN: 18 mg/dL (ref 6–23)
CHLORIDE: 105 meq/L (ref 96–112)
CO2: 22 meq/L (ref 19–32)
Calcium: 8.7 mg/dL (ref 8.4–10.5)
Creatinine, Ser: 0.8 mg/dL (ref 0.50–1.10)
GFR calc Af Amer: 77 mL/min — ABNORMAL LOW (ref >90)
GFR calc non Af Amer: 66 mL/min — ABNORMAL LOW (ref >90)
Glucose, Bld: 170 mg/dL — ABNORMAL HIGH (ref 70–99)
Potassium: 4.8 mEq/L (ref 3.7–5.3)
SODIUM: 136 meq/L — AB (ref 137–147)

## 2014-03-08 LAB — CBC
HCT: 29.7 % — ABNORMAL LOW (ref 36.0–46.0)
Hemoglobin: 10.1 g/dL — ABNORMAL LOW (ref 12.0–15.0)
MCH: 30.6 pg (ref 26.0–34.0)
MCHC: 34 g/dL (ref 30.0–36.0)
MCV: 90 fL (ref 78.0–100.0)
PLATELETS: 148 10*3/uL — AB (ref 150–400)
RBC: 3.3 MIL/uL — AB (ref 3.87–5.11)
RDW: 13.2 % (ref 11.5–15.5)
WBC: 16.8 10*3/uL — AB (ref 4.0–10.5)

## 2014-03-08 MED ORDER — METHOCARBAMOL 500 MG PO TABS: 500.0000 mg | ORAL_TABLET | Freq: Four times a day (QID) | ORAL | Status: DC | PRN

## 2014-03-08 MED ORDER — DSS 100 MG PO CAPS: 100.0000 mg | ORAL_CAPSULE | Freq: Two times a day (BID) | ORAL | Status: DC

## 2014-03-08 MED ORDER — TRAMADOL HCL 50 MG PO TABS: 50.0000 mg | ORAL_TABLET | Freq: Four times a day (QID) | ORAL | Status: DC | PRN

## 2014-03-08 MED ORDER — OXYCODONE HCL 5 MG PO TABS: 5.0000 mg | ORAL_TABLET | ORAL | Status: DC | PRN

## 2014-03-08 MED ORDER — RIVAROXABAN 10 MG PO TABS: 10.0000 mg | ORAL_TABLET | Freq: Every day | ORAL | Status: DC

## 2014-03-08 NOTE — Progress Notes (Signed)
Physical Therapy Treatment Patient Details Name: Kristy [name] MRN: [id number] DOB: 01/15/1928 Today's Date: 03/08/2014    History of Present Illness 80 yo female s/p R TKA 03/07/14. hx of L TKA    PT Comments    Progressing with mobility. Pt states she is unsure if she will be ready to d/c home on tomorrow. Will continue to assess and progress activity as able.   Follow Up Recommendations  Home health PT;Supervision/Assistance - 24 hour     Equipment Recommendations  None recommended by PT    Recommendations for Other Services       Precautions / Restrictions Precautions Precautions: Fall Required Braces or Orthoses: Knee Immobilizer - Right Knee Immobilizer - Right: Discontinue once straight leg raise with < 10 degree lag Restrictions Weight Bearing Restrictions: No RLE Weight Bearing: Weight bearing as tolerated    Mobility  Bed Mobility Overal bed mobility: Needs Assistance Bed Mobility: Sit to Supine     Supine to sit: Min assist Sit to supine: Min assist   General bed mobility comments: assist for R. Increased time. VCs safety, technique, hand placement  Transfers Overall transfer level: Needs assistance Equipment used: Rolling walker (2 wheeled) Transfers: Sit to/from Stand Sit to Stand: Min assist         General transfer comment: assist to rise, stabilize, control descent. VCS safety, technique, hand placement  Ambulation/Gait Ambulation/Gait assistance: Min assist Ambulation Distance (Feet): 75 Feet Assistive device: Rolling walker (2 wheeled) Gait Pattern/deviations: Antalgic;Decreased stride length;Step-to pattern;Decreased step length - right     General Gait Details: slow gait speed. VCs safety, technique, sequence. fatigues fairly easily.    Stairs            Wheelchair Mobility    Modified Rankin (Stroke Patients Only)       Balance Overall balance assessment: Needs assistance         Standing balance support: During  functional activity;Bilateral upper extremity supported Standing balance-Leahy Scale: Poor                      Cognition                            Exercises Total Joint Exercises Ankle Circles/Pumps: AROM;Both;10 reps;Supine Quad Sets: AROM;Both;10 reps;Supine Heel Slides: AAROM;Right;10 reps;Supine Hip ABduction/ADduction: AAROM;Right;10 reps;Supine Straight Leg Raises: AAROM;Right;10 reps;Supine Goniometric ROM: 10-30 degrees    General Comments        Pertinent Vitals/Pain 6/10 R knee with activity. Ice applied end of session    Home Living                      Prior Function            PT Goals (current goals can now be found in the care plan section) Acute Rehab PT Goals Patient Stated Goal: home. less pain.  PT Goal Formulation: With patient Time For Goal Achievement: 03/22/14 Potential to Achieve Goals: Good Progress towards PT goals: Progressing toward goals    Frequency  7X/week    PT Plan Current plan remains appropriate    Co-evaluation             End of Session Equipment Utilized During Treatment: Gait belt Activity Tolerance: Patient limited by fatigue;Patient limited by pain Patient left: in bed;with call bell/phone within reach     Time: 3220-2542 PT Time Calculation (min): 34 min  Charges:  $Gait Training:  8-22 mins $Therapeutic Exercise: 8-22 mins                    G Codes:      Weston Anna, MPT Pager: [phone]

## 2014-03-08 NOTE — Evaluation (Signed)
Physical Therapy Evaluation Patient Details Name: Kristy [name] MRN: [id number] DOB: 03/10/1928 Today's Date: 03/08/2014   History of Present Illness  80 yo female s/p R TKA 03/07/14. hx of L TKA  Clinical Impression  On eval, pt required Mod assist for mobility-able to ambulate ~40 feet with rolling walker.     Follow Up Recommendations Home health PT;Supervision/Assistance - 24 hour    Equipment Recommendations       Recommendations for Other Services       Precautions / Restrictions Precautions Precautions: Fall Required Braces or Orthoses: Knee Immobilizer - Right Knee Immobilizer - Right: Discontinue once straight leg raise with < 10 degree lag Restrictions Weight Bearing Restrictions: No RLE Weight Bearing: Weight bearing as tolerated      Mobility  Bed Mobility Overal bed mobility: Needs Assistance Bed Mobility: Supine to Sit     Supine to sit: Min assist     General bed mobility comments: assist for R LE off bed. Increased time. VCs safety, technique, hand placement  Transfers Overall transfer level: Needs assistance Equipment used: Rolling walker (2 wheeled) Transfers: Sit to/from Stand Sit to Stand: Mod assist;From elevated surface         General transfer comment: assist to rise, stabilize, control descent. VCS safety, technique, hand placement  Ambulation/Gait Ambulation/Gait assistance: Min assist Ambulation Distance (Feet): 40 Feet Assistive device: Rolling walker (2 wheeled) Gait Pattern/deviations: Antalgic;Decreased stride length;Step-to pattern;Trunk flexed;Decreased weight shift to right;Decreased step length - left;Decreased step length - right     General Gait Details: slow gait speed. VCs safety, technique, sequence. fatigues fairly easily.   Stairs            Wheelchair Mobility    Modified Rankin (Stroke Patients Only)       Balance Overall balance assessment: Needs assistance         Standing balance support:  During functional activity;Bilateral upper extremity supported Standing balance-Leahy Scale: Poor                               Pertinent Vitals/Pain 6/10 R knee with activity. Ice applied end of session    [place] expects to be discharged to:: Private residence Living Arrangements: Spouse/significant other (granddaughter checking on as well) Available Help at Discharge: Family;Available 24 hours/day Type of Home: House Home Access: Level entry     Home Layout: One level;Laundry or work area in [place]: Environmental consultant - 2 wheels;Bedside commode;Wheelchair - manual      Prior Function Level of Independence: Independent               Hand Dominance        Extremity/Trunk Assessment   Upper Extremity Assessment: Overall WFL for tasks assessed           Lower Extremity Assessment: RLE deficits/detail RLE Deficits / Details: hip flex 2/5, hip abd/add 2/5, moves ankle well    Cervical / Trunk Assessment: Normal  Communication   Communication: HOH  Cognition Arousal/Alertness: Awake/alert Behavior During Therapy: WFL for tasks assessed/performed Overall Cognitive Status: Within Functional Limits for tasks assessed                      General Comments      Exercises        Assessment/Plan    PT Assessment Patient needs continued PT services  PT Diagnosis Difficulty walking;Abnormality of gait;Acute pain   PT  Problem List Decreased strength;Decreased range of motion;Decreased activity tolerance;Decreased balance;Pain;Decreased knowledge of use of DME  PT Treatment Interventions DME instruction;Gait training;Stair training;Functional mobility training;Therapeutic activities;Therapeutic exercise;Patient/family education;Balance training   PT Goals (Current goals can be found in the Care Plan section) Acute Rehab PT Goals Patient Stated Goal: home. less pain.  PT Goal Formulation: With patient Time For Goal  Achievement: 03/22/14 Potential to Achieve Goals: Good    Frequency 7X/week   Barriers to discharge        Co-evaluation               End of Session Equipment Utilized During Treatment: Gait belt Activity Tolerance: Patient limited by fatigue;Patient limited by pain Patient left: in chair;with call bell/phone within reach           Time: 0940-1000 PT Time Calculation (min): 20 min   Charges:   PT Evaluation $Initial PT Evaluation Tier I: 1 Procedure PT Treatments $Gait Training: 8-22 mins   PT G Codes:          Weston Anna, MPT Pager: [phone]

## 2014-03-08 NOTE — Progress Notes (Signed)
OT Cancellation Note  Patient Details Name: Kristy [name] MRN: [id number] DOB: Oct 02, 1927   Cancelled Treatment:    Reason Eval/Treat Not Completed: Other (comment).  Pt/visitor would like OT to review education for bathroom transfers:  She has DME from last surgery. Pt is sleepy at this time and would like for [place] to return later.  Will either check back this pm or tomorrow am.   Lesle Chris 03/08/2014, 10:59 AM Lesle Chris, OTR/L [phone] 03/08/2014

## 2014-03-08 NOTE — Progress Notes (Signed)
   Subjective: 1 Day Post-Op Procedure(s) (LRB): RIGHT TOTAL KNEE ARTHROPLASTY (Right) Patient reports pain as moderate.   Patient seen in rounds with Dr. Wynelle Link. Patient is well, and has had no acute complaints or problems. No complications overnight. No SOB or chest pain. Reports pain but is doing "well".  We will start therapy today.  Plan is to go Home after hospital stay.  Objective: Vital signs in last 24 hours: Temp:  [97.5 F (36.4 C)-98 F (36.7 C)] 97.5 F (36.4 C) (06/09 0502) Pulse Rate:  [55-71] 66 (06/09 0502) Resp:  [13-21] 16 (06/09 0502) BP: (110-151)/(56-77) 119/67 mmHg (06/09 0502) SpO2:  [97 %-100 %] 100 % (06/09 0502) Weight:  [78.926 kg (174 lb)] 78.926 kg (174 lb) (06/08 1214)  Intake/Output from previous day:  Intake/Output Summary (Last 24 hours) at 03/08/14 0753 Last data filed at 03/08/14 0505  Gross per 24 hour  Intake 2218.75 ml  Output   2295 ml  Net -76.25 ml     Labs:  Recent Labs  03/08/14 0445  HGB 10.1*    Recent Labs  03/08/14 0445  WBC 16.8*  RBC 3.30*  HCT 29.7*  PLT 148*    Recent Labs  03/08/14 0445  NA 136*  K 4.8  CL 105  CO2 22  BUN 18  CREATININE 0.80  GLUCOSE 170*  CALCIUM 8.7    EXAM General - Patient is Alert and Oriented Extremity - Neurologically intact Intact pulses distally Dorsiflexion/Plantar flexion intact Compartment soft Dressing - dressing C/D/I Motor Function - intact, moving foot and toes well on exam.  Hemovac pulled without difficulty.  Past Medical History  Diagnosis Date  . Diabetes mellitus   . Hypercholesteremia   . Colon polyps   . Diverticulitis   . Anxiety   . H/O hiatal hernia   . Hypertension     Dr. Laurance Flatten  . History of shingles 01-07-13    3'14 recent outbreak- drying in   . Lumbar spondylosis     osteoarthritis-knees  . Hearing loss 01-07-13    bilateral hearing aids  . Macular degeneration of both eyes     and dry eyes  . Complication of anesthesia  01-18-2013    slow to awaken and felt crazy for 3-4 days    Assessment/Plan: 1 Day Post-Op Procedure(s) (LRB): RIGHT TOTAL KNEE ARTHROPLASTY (Right) Active Problems:   OA (osteoarthritis) of knee  Estimated body mass index is 31.3 kg/(m^2) as calculated from the following:   Height as of this encounter: 5' 2.5" (1.588 m).   Weight as of this encounter: 78.926 kg (174 lb). Advance diet Up with therapy D/C IV fluids when tolerating POs well  DVT Prophylaxis - Xarelto Weight-Bearing as tolerated  D/C O2 and Pulse OX and try on Room Air  Continue PT today. Plan for DC tomorrow.   Ardeen Jourdain, PA-C Orthopaedic Surgery 03/08/2014, 7:53 AM

## 2014-03-08 NOTE — Discharge Instructions (Addendum)
° °Dr. Twana Wileman °Total Joint Specialist °[facility] °[address] °[place], [place] [address] °[phone] ° °TOTAL KNEE REPLACEMENT POSTOPERATIVE DIRECTIONS ° ° ° °Knee Rehabilitation, Guidelines Following Surgery  °Results after knee surgery are often greatly improved when you follow the exercise, range of motion and muscle strengthening exercises prescribed by your doctor. Safety measures are also important to protect the knee from further injury. Any time any of these exercises cause you to have increased pain or swelling in your knee joint, decrease the amount until you are comfortable again and slowly increase them. If you have problems or questions, call your caregiver or physical therapist for advice.  ° °HOME CARE INSTRUCTIONS  °Remove items at home which could result in a fall. This includes throw rugs or furniture in walking pathways.  °Continue medications as instructed at time of discharge. °You may have some home medications which will be placed on hold until you complete the course of blood thinner medication.  °You may start showering once you are discharged home but do not submerge the incision under water. Just pat the incision dry and apply a dry gauze dressing on daily. °Walk with walker as instructed.  °You may resume a sexual relationship in one month or when given the OK by  your doctor.  °· Use walker as long as suggested by your caregivers. °· Avoid periods of inactivity such as sitting longer than an hour when not asleep. This helps prevent blood clots.  °You may put full weight on your legs and walk as much as is comfortable.  °You may return to work once you are cleared by your doctor.  °Do not drive a car for 6 weeks or until released by you surgeon.  °· Do not drive while taking narcotics.  °Wear the elastic stockings for three weeks following surgery during the day but you may remove then at night. °Make sure you keep all of your appointments after your  operation with all of your doctors and caregivers. You should call the office at the above phone number and make an appointment for approximately two weeks after the date of your surgery. °Change the dressing daily and reapply a dry dressing each time. °Please pick up a stool softener and laxative for home use as long as you are requiring pain medications. °· Continue to use ice on the knee for pain and swelling from surgery. You may notice swelling that will progress down to the foot and ankle.  This is normal after surgery.  Elevate the leg when you are not up walking on it.   °It is important for you to complete the blood thinner medication as prescribed by your doctor. °· Continue to use the breathing machine which will help keep your temperature down.  It is common for your temperature to cycle up and down following surgery, especially at night when you are not up moving around and exerting yourself.  The breathing machine keeps your lungs expanded and your temperature down. ° °RANGE OF MOTION AND STRENGTHENING EXERCISES  °Rehabilitation of the knee is important following a knee injury or an operation. After just a few days of immobilization, the muscles of the thigh which control the knee become weakened and shrink (atrophy). Knee exercises are designed to build up the tone and strength of the thigh muscles and to improve knee motion. Often times heat used for twenty to thirty minutes before working out will loosen up your tissues and help with improving the   range of motion but do not use heat for the first two weeks following surgery. These exercises can be done on a training (exercise) mat, on the floor, on a table or on a bed. Use what ever works the best and is most comfortable for you Knee exercises include:  Leg Lifts - While your knee is still immobilized in a splint or cast, you can do straight leg raises. Lift the leg to 60 degrees, hold for 3 sec, and slowly lower the leg. Repeat 10-20 times 2-3  times daily. Perform this exercise against resistance later as your knee gets better.  Quad and Hamstring Sets - Tighten up the muscle on the front of the thigh (Quad) and hold for 5-10 sec. Repeat this 10-20 times hourly. Hamstring sets are done by pushing the foot backward against an object and holding for 5-10 sec. Repeat as with quad sets.  A rehabilitation program following serious knee injuries can speed recovery and prevent re-injury in the future due to weakened muscles. Contact your doctor or a physical therapist for more information on knee rehabilitation.   SKILLED REHAB INSTRUCTIONS: If the patient is transferred to a skilled rehab facility following release from the hospital, a list of the current medications will be sent to the facility for the patient to continue.  When discharged from the skilled rehab facility, please have the facility set up the patient's [place] prior to being released. Also, the skilled facility will be responsible for providing the patient with their medications at time of release from the facility to include their pain medication, the muscle relaxants, and their blood thinner medication. If the patient is still at the rehab facility at time of the two week follow up appointment, the skilled rehab facility will also need to assist the patient in arranging follow up appointment in our office and any transportation needs.  MAKE SURE YOU:  Understand these instructions.  Will watch your condition.  Will get help right away if you are not doing well or get worse.    Pick up stool softner and laxative for home. Do not submerge incision under water. May shower. Continue to use ice for pain and swelling from surgery.             Information on my medicine - XARELTO (Rivaroxaban)  This medication education was reviewed with me or my healthcare representative as part of my discharge preparation.  The pharmacist that spoke with me  during my hospital stay was:  Kara Mead, [facility]  Why was Xarelto prescribed for you? Xarelto was prescribed for you to reduce the risk of blood clots forming after orthopedic surgery. The medical term for these abnormal blood clots is venous thromboembolism (VTE).  What do you need to know about xarelto ? Take your Xarelto ONCE DAILY at the same time every day. You may take it either with or without food.  If you have difficulty swallowing the tablet whole, you may crush it and mix in applesauce just prior to taking your dose.  Take Xarelto exactly as prescribed by your doctor and DO NOT stop taking Xarelto without talking to the doctor who prescribed the medication.  Stopping without other VTE prevention medication to take the place of Xarelto may increase your risk of developing a clot.  After discharge, you should have regular check-up appointments with your healthcare provider that is prescribing your Xarelto.    What do you do if you miss a dose? If you  miss a dose, take it as soon as you remember on the same day then continue your regularly scheduled once daily regimen the next day. Do not take two doses of Xarelto on the same day.   Important Safety Information A possible side effect of Xarelto is bleeding. You should call your healthcare provider right away if you experience any of the following:   Bleeding from an injury or your nose that does not stop.   Unusual colored urine (red or dark brown) or unusual colored stools (red or black).   Unusual bruising for unknown reasons.   A serious fall or if you hit your head (even if there is no bleeding).  Some medicines may interact with Xarelto and might increase your risk of bleeding while on Xarelto. To help avoid this, consult your healthcare provider or pharmacist prior to using any new prescription or non-prescription medications, including herbals, vitamins, non-steroidal anti-inflammatory drugs (NSAIDs) and  supplements.  This website has more information on Xarelto: [email or web address]

## 2014-03-09 LAB — GLUCOSE, CAPILLARY
GLUCOSE-CAPILLARY: 142 mg/dL — AB (ref 70–99)
GLUCOSE-CAPILLARY: 233 mg/dL — AB (ref 70–99)
Glucose-Capillary: 187 mg/dL — ABNORMAL HIGH (ref 70–99)
Glucose-Capillary: 202 mg/dL — ABNORMAL HIGH (ref 70–99)

## 2014-03-09 LAB — CBC
HEMATOCRIT: 29.5 % — AB (ref 36.0–46.0)
Hemoglobin: 9.7 g/dL — ABNORMAL LOW (ref 12.0–15.0)
MCH: 29.8 pg (ref 26.0–34.0)
MCHC: 32.9 g/dL (ref 30.0–36.0)
MCV: 90.5 fL (ref 78.0–100.0)
Platelets: 147 10*3/uL — ABNORMAL LOW (ref 150–400)
RBC: 3.26 MIL/uL — ABNORMAL LOW (ref 3.87–5.11)
RDW: 13.4 % (ref 11.5–15.5)
WBC: 16.3 10*3/uL — AB (ref 4.0–10.5)

## 2014-03-09 LAB — BASIC METABOLIC PANEL
BUN: 16 mg/dL (ref 6–23)
CO2: 23 meq/L (ref 19–32)
CREATININE: 0.85 mg/dL (ref 0.50–1.10)
Calcium: 9.2 mg/dL (ref 8.4–10.5)
Chloride: 101 mEq/L (ref 96–112)
GFR calc Af Amer: 71 mL/min — ABNORMAL LOW (ref >90)
GFR calc non Af Amer: 62 mL/min — ABNORMAL LOW (ref >90)
Glucose, Bld: 160 mg/dL — ABNORMAL HIGH (ref 70–99)
Potassium: 4.5 mEq/L (ref 3.7–5.3)
Sodium: 137 mEq/L (ref 137–147)

## 2014-03-09 NOTE — Progress Notes (Signed)
Physical Therapy Treatment Patient Details Name: Kristy [name] MRN: [id number] DOB: 04/05/1928 Today's Date: 03/09/2014    History of Present Illness 80 yo female s/p R TKA 03/07/14. hx of L TKA    PT Comments    Pt states she still feels "tired." Decreased ambulation distance this afternoon due to increased pain. Discussed d/c-therapist feels pt may benefit from a couple more sessions before she discharges home-husband agrees, however pt feels she will be better at home. Pt states she will discuss it with MD when he comes by to speak with her. Pt states she will have sufficient assistance at home from family-will likely require Min to Mod assist for mobility.   Follow Up Recommendations  Home health PT;Supervision/Assistance - 24 hour     Equipment Recommendations  None recommended by PT    Recommendations for Other Services OT consult     Precautions / Restrictions Precautions Precautions: Fall Required Braces or Orthoses: Knee Immobilizer - Right Knee Immobilizer - Right: Discontinue once straight leg raise with < 10 degree lag Restrictions Weight Bearing Restrictions: No RLE Weight Bearing: Weight bearing as tolerated    Mobility  Bed Mobility Overal bed mobility: Needs Assistance Bed Mobility: Supine to Sit;Sit to Supine     Supine to sit: Min assist Sit to supine: Min assist   General bed mobility comments: assist for RLE. Increased time.   Transfers Overall transfer level: Needs assistance Equipment used: Rolling walker (2 wheeled) Transfers: Sit to/from Stand Sit to Stand: Min assist         General transfer comment: Assist to rise, stabilize, control descent. VCs safety, technique. LOB x 1 posteriorly with initial standing. Increased time to stabilize in standing.   Ambulation/Gait Ambulation/Gait assistance: Min assist Ambulation Distance (Feet): 85 Feet Assistive device: Rolling walker (2 wheeled) Gait Pattern/deviations: Step-to  pattern;Step-through pattern;Antalgic;Decreased stride length;Trunk flexed     General Gait Details: slow gait speed. VCs safety, technique, sequence. fatigues fairly easily. assist to stabilize intermittently. Increased difficulty this pm due to pain.    Stairs            Wheelchair Mobility    Modified Rankin (Stroke Patients Only)       Balance                                    Cognition Arousal/Alertness: Awake/alert Behavior During Therapy: WFL for tasks assessed/performed Overall Cognitive Status: Within Functional Limits for tasks assessed                      Exercises Total Joint Exercises Ankle Circles/Pumps: AROM;Both;10 reps;Seated Quad Sets: AROM;Both;10 reps;Seated Heel Slides: AAROM;Right;10 reps;Seated (slow progressing with knee ROM) Hip ABduction/ADduction: AAROM;Right;10 reps;Seated Straight Leg Raises: AAROM;Right;10 reps;Seated Goniometric ROM: 10-30 degrees    General Comments        Pertinent Vitals/Pain 6/10 R knee with activity. Ice applied end of session    Home Living                      Prior Function            PT Goals (current goals can now be found in the care plan section) Progress towards PT goals: Progressing toward goals    Frequency  7X/week    PT Plan Current plan remains appropriate    Co-evaluation  End of Session Equipment Utilized During Treatment: Gait belt Activity Tolerance: Patient limited by fatigue;Patient limited by pain Patient left: in bed;with call bell/phone within reach;with family/visitor present     Time: 4098-1191 PT Time Calculation (min): 33 min  Charges:  $Gait Training: 23-37 mins $Therapeutic Exercise: 8-22 mins                    G Codes:      Weston Anna, MPT Pager: [phone]

## 2014-03-09 NOTE — Evaluation (Signed)
Occupational Therapy Evaluation Patient Details Name: Kristy [name] MRN: [id number] DOB: [birth year] Today's Date: 03/09/2014    History of Present Illness 80 yo female s/p R TKA 03/07/14. hx of L TKA   Clinical Impression   Pt was admitted for the above surgery. She has h/o L TKA, but family asked me to review bathroom transfers when I met them yesterday.  Pt had not washed and wanted to perform ADL once in bathroom.  Practiced toilet and reviewed shower transfer.  She will have 24/7 assistance at home.  No further OT is needed at this time.      Follow Up Recommendations  No OT follow up;Supervision/Assistance - 24 hour    Equipment Recommendations  None recommended by OT    Recommendations for Other Services       Precautions / Restrictions Precautions Precautions: Fall Restrictions Weight Bearing Restrictions: No      Mobility Bed Mobility           Sit to supine: Min assist   General bed mobility comments: assist for RLE  Transfers     Transfers: Sit to/from Stand Sit to Stand: Min assist;Mod assist         General transfer comment: min from commode; mod from bed; cues for UE/LE placement    Balance                                            ADL Overall ADL's : Needs assistance/impaired     Grooming: Set up;Sitting   Upper Body Bathing: Set up;Sitting   Lower Body Bathing: Moderate assistance;Sit to/from stand   Upper Body Dressing : Set up;Sitting   Lower Body Dressing: Maximal assistance;Sit to/from stand   Toilet Transfer: Minimal assistance;BSC;Ambulation   Toileting- Clothing Manipulation and Hygiene: Minimal assistance;Sit to/from stand         General ADL Comments: pt needed mod A for sit to stand from bed, then min A for standing from 3:1.  Performed ADL in bathroom, sit to stand.  Pt donned own clothes/underwear.  Fatiqued afterwards.  Reviewed shower transfer but did not practice.  Pt was able to verbalize  sequence:  reminded her to back in and come out forward     Vision                     Perception     Praxis      Pertinent Vitals/Pain No pain in bed; quite a lot when standing, initially, but back to just a little when repositioned in chair.  Had just removed ice.  Pt was premedicated     Hand Dominance     Extremity/Trunk Assessment Upper Extremity Assessment Upper Extremity Assessment: Overall WFL for tasks assessed           Communication Communication Communication: HOH   Cognition Arousal/Alertness: Awake/alert Behavior During Therapy: WFL for tasks assessed/performed Overall Cognitive Status: Within Functional Limits for tasks assessed                     General Comments       Exercises       Shoulder Instructions      Home Living Family/patient expects to be discharged to:: Private residence Living Arrangements: Spouse/significant other Available Help at Discharge: Family;Available 24 hours/day Type of Home: House Home Access: Level entry  Home Layout: One level;Laundry or work area in [place] Shower/Tub: Occupational psychologist: [place]: Environmental consultant - 2 wheels;Bedside commode;Wheelchair - manual   Additional Comments: grandson is getting her a shower seat      Prior Functioning/Environment Level of Independence: Independent             OT Diagnosis:     OT Problem List:     OT Treatment/Interventions:      OT Goals(Current goals can be found in the care plan section)    OT Frequency:     Barriers to D/C:            Co-evaluation              End of Session    Activity Tolerance: Patient tolerated treatment well Patient left: in chair;with call bell/phone within reach;with family/visitor present   Time: 1030-1103 OT Time Calculation (min): 33 min Charges:  OT General Charges $OT Visit: 1 Procedure OT Evaluation $Initial OT Evaluation Tier I: 1  Procedure OT Treatments $Self Care/Home Management : 23-37 mins G-Codes:    Kristy [name] 03-18-14, 11:12 AM  Lesle Chris, OTR/L [phone] 03-18-2014

## 2014-03-09 NOTE — Progress Notes (Signed)
   Subjective: 2 Days Post-Op Procedure(s) (LRB): RIGHT TOTAL KNEE ARTHROPLASTY (Right) Patient reports pain as moderate.   Patient seen in rounds with Dr. Wynelle Link. Patient is well, and has had no acute complaints or problems. She reports that she was able to get some rest last night.  No issues overnight. No SOB or chest pain. Plan is to go Home after hospital stay.  Objective: Vital signs in last 24 hours: Temp:  [97.6 F (36.4 C)-99 F (37.2 C)] 99 F (37.2 C) (06/10 0646) Pulse Rate:  [64-81] 78 (06/10 0646) Resp:  [15-18] 18 (06/10 0800) BP: (112-142)/(45-63) 142/61 mmHg (06/10 0646) SpO2:  [93 %-99 %] 99 % (06/10 0646)  Intake/Output from previous day:  Intake/Output Summary (Last 24 hours) at 03/09/14 0819 Last data filed at 03/09/14 0647  Gross per 24 hour  Intake 1416.25 ml  Output    600 ml  Net 816.25 ml   Labs:  Recent Labs  03/08/14 0445 03/09/14 0450  HGB 10.1* 9.7*    Recent Labs  03/08/14 0445 03/09/14 0450  WBC 16.8* 16.3*  RBC 3.30* 3.26*  HCT 29.7* 29.5*  PLT 148* 147*    Recent Labs  03/08/14 0445 03/09/14 0450  NA 136* 137  K 4.8 4.5  CL 105 101  CO2 22 23  BUN 18 16  CREATININE 0.80 0.85  GLUCOSE 170* 160*  CALCIUM 8.7 9.2    EXAM General - Patient is Alert and Oriented Extremity - Neurologically intact Intact pulses distally Dorsiflexion/Plantar flexion intact Compartment soft Dressing/Incision - clean, dry, no drainage Motor Function - intact, moving foot and toes well on exam.   Past Medical History  Diagnosis Date  . Diabetes mellitus   . Hypercholesteremia   . Colon polyps   . Diverticulitis   . Anxiety   . H/O hiatal hernia   . Hypertension     Dr. Laurance Flatten  . History of shingles 01-07-13    3'14 recent outbreak- drying in   . Lumbar spondylosis     osteoarthritis-knees  . Hearing loss 01-07-13    bilateral hearing aids  . Macular degeneration of both eyes     and dry eyes  . Complication of anesthesia  01-18-2013    slow to awaken and felt crazy for 3-4 days    Assessment/Plan: 2 Days Post-Op Procedure(s) (LRB): RIGHT TOTAL KNEE ARTHROPLASTY (Right) Active Problems:   OA (osteoarthritis) of knee  Estimated body mass index is 31.3 kg/(m^2) as calculated from the following:   Height as of this encounter: 5' 2.5" (1.588 m).   Weight as of this encounter: 78.926 kg (174 lb). Advance diet Up with therapy D/C IV fluids  DVT Prophylaxis - Xarelto Weight-Bearing as tolerated   Progressing well. Will have her do two sessions over PT today before DC home.   Ardeen Jourdain, PA-C Orthopaedic Surgery 03/09/2014, 8:19 AM

## 2014-03-09 NOTE — Progress Notes (Signed)
Physical Therapy Treatment Patient Details Name: Kristy [name] MRN: [id number] DOB: [birth year] Today's Date: 03/09/2014    History of Present Illness 80 yo female s/p R TKA 03/07/14. hx of L TKA    PT Comments    Progressing slowly with mobility. Pt fatigues easily. Will see a 2nd time today. Pt is still undecided about whether she wants to d/c today or tomorrow.   Follow Up Recommendations  Home health PT;Supervision/Assistance - 24 hour     Equipment Recommendations  None recommended by PT    Recommendations for Other Services OT consult     Precautions / Restrictions Precautions Precautions: Fall Required Braces or Orthoses: Knee Immobilizer - Right Knee Immobilizer - Right: Discontinue once straight leg raise with < 10 degree lag Restrictions Weight Bearing Restrictions: No RLE Weight Bearing: Weight bearing as tolerated    Mobility  Bed Mobility           Sit to supine: Min assist   General bed mobility comments: assist for RLE  Transfers Overall transfer level: Needs assistance Equipment used: Rolling walker (2 wheeled) Transfers: Sit to/from Stand Sit to Stand: Min assist         General transfer comment: Assist to rise, stabilize, control descent. VCs safety, technique. LOB x 1 posteriorly with initial standing.   Ambulation/Gait Ambulation/Gait assistance: Min guard Ambulation Distance (Feet): 100 Feet Assistive device: Rolling walker (2 wheeled) Gait Pattern/deviations: Step-to pattern;Antalgic;Trunk flexed;Decreased stride length;Step-through pattern     General Gait Details: slow gait speed. VCs safety, technique, sequence. fatigues fairly easily. close guard for safety   Stairs            Wheelchair Mobility    Modified Rankin (Stroke Patients Only)       Balance                                    Cognition Arousal/Alertness: Awake/alert Behavior During Therapy: WFL for tasks assessed/performed Overall  Cognitive Status: Within Functional Limits for tasks assessed                      Exercises Total Joint Exercises Ankle Circles/Pumps: AROM;Both;10 reps;Seated Quad Sets: AROM;Both;10 reps;Seated Heel Slides: AAROM;Right;10 reps;Seated (slow progressing with knee ROM) Hip ABduction/ADduction: AAROM;Right;10 reps;Seated Straight Leg Raises: AAROM;Right;10 reps;Seated Goniometric ROM: 10-30 degrees    General Comments        Pertinent Vitals/Pain 6/10 R knee with activity. Ice applied end of session    [name] expects to be discharged to:: Private residence Living Arrangements: Spouse/significant other Available Help at Discharge: Family;Available 24 hours/day Type of Home: House Home Access: Level entry   Home Layout: One level;Laundry or work area in [place]: Environmental consultant - 2 wheels;Bedside commode;Wheelchair - manual Additional Comments: grandson is getting her a shower seat    Prior Function Level of Independence: Independent          PT Goals (current goals can now be found in the care plan section) Progress towards PT goals: Progressing toward goals (slowly)    Frequency  7X/week    PT Plan Current plan remains appropriate    Co-evaluation             End of Session Equipment Utilized During Treatment: Gait belt Activity Tolerance: Patient limited by fatigue Patient left: in chair;with call bell/phone within reach     Time: 9937-1696 PT Time Calculation (min):  24 min  Charges:  $Gait Training: 8-22 mins $Therapeutic Exercise: 8-22 mins                    G Codes:      Weston Anna, MPT Pager: [phone]

## 2014-03-10 LAB — CBC
HCT: 28.7 % — ABNORMAL LOW (ref 36.0–46.0)
Hemoglobin: 9.9 g/dL — ABNORMAL LOW (ref 12.0–15.0)
MCH: 30.9 pg (ref 26.0–34.0)
MCHC: 34.5 g/dL (ref 30.0–36.0)
MCV: 89.7 fL (ref 78.0–100.0)
PLATELETS: 163 10*3/uL (ref 150–400)
RBC: 3.2 MIL/uL — AB (ref 3.87–5.11)
RDW: 13.2 % (ref 11.5–15.5)
WBC: 15.6 10*3/uL — ABNORMAL HIGH (ref 4.0–10.5)

## 2014-03-10 LAB — GLUCOSE, CAPILLARY: GLUCOSE-CAPILLARY: 193 mg/dL — AB (ref 70–99)

## 2014-03-10 NOTE — Progress Notes (Signed)
Physical Therapy Treatment Patient Details Name: Kristy [name] MRN: [id number] DOB: [birth year] Today's Date: 03/10/2014    History of Present Illness 80 yo female s/p R TKA 03/07/14. hx of L TKA    PT Comments    Progressing with mobility. Pt still reports moderate pain with activity. Practiced ambulation and exercises. Pt states she does not have steps to enter home. All education completed. Ready to d/c from PT standpoint.   Follow Up Recommendations  Home health PT;Supervision/Assistance - 24 hour     Equipment Recommendations  None recommended by PT    Recommendations for Other Services OT consult     Precautions / Restrictions Precautions Precautions: Fall Required Braces or Orthoses: Knee Immobilizer - Right Restrictions Weight Bearing Restrictions: No RLE Weight Bearing: Weight bearing as tolerated    Mobility  Bed Mobility Overal bed mobility: Needs Assistance Bed Mobility: Supine to Sit     Supine to sit: Min assist     General bed mobility comments: assist for RLE. Increased time.   Transfers Overall transfer level: Needs assistance Equipment used: Rolling walker (2 wheeled) Transfers: Sit to/from Stand Sit to Stand: Min assist         General transfer comment: Assist to rise, stabilize, control descent. VCs safety, technique. Increased time to rise and stabilize  Ambulation/Gait Ambulation/Gait assistance: Min assist Ambulation Distance (Feet): 115 Feet Assistive device: Rolling walker (2 wheeled) Gait Pattern/deviations: Step-to pattern;Step-through pattern;Antalgic;Decreased stride length     General Gait Details: slow gait speed. VCs safety, technique, sequence. assist to stabilize intermittently.    Stairs            Wheelchair Mobility    Modified Rankin (Stroke Patients Only)       Balance                                    Cognition Arousal/Alertness: Awake/alert Behavior During Therapy: WFL for tasks  assessed/performed Overall Cognitive Status: Within Functional Limits for tasks assessed                      Exercises Total Joint Exercises Ankle Circles/Pumps: AROM;Both;10 reps;Supine Quad Sets: AROM;Both;10 reps;Supine Heel Slides: AAROM;Right;10 reps;Supine (slow progress with ROM) Hip ABduction/ADduction: AAROM;Right;10 reps;Supine Straight Leg Raises: AAROM;Right;10 reps;Supine Goniometric ROM: 10-35 degrees    General Comments        Pertinent Vitals/Pain 6/10 R knee with activity. 0/10 pain at rest. Ice applied end of session    Home Living                      Prior Function            PT Goals (current goals can now be found in the care plan section) Progress towards PT goals: Progressing toward goals    Frequency  7X/week    PT Plan Current plan remains appropriate    Co-evaluation             End of Session Equipment Utilized During Treatment: Gait belt Activity Tolerance: Patient limited by pain Patient left: in chair;with call bell/phone within reach;with family/visitor present     Time: 8588-5027 PT Time Calculation (min): 23 min  Charges:  $Gait Training: 8-22 mins $Therapeutic Exercise: 8-22 mins                    G Codes:  Weston Anna, MPT Pager: [phone]

## 2014-03-10 NOTE — Progress Notes (Signed)
   Subjective: 3 Days Post-Op Procedure(s) (LRB): RIGHT TOTAL KNEE ARTHROPLASTY (Right) Patient reports pain as mild.   Feeling a lot better this AM. Glad she stayed here last night Plan is to go Home after hospital stay.  Objective: Vital signs in last 24 hours: Temp:  [98.2 F (36.8 C)-100.6 F (38.1 C)] 98.3 F (36.8 C) (06/11 0609) Pulse Rate:  [84-100] 100 (06/11 0609) Resp:  [16-18] 18 (06/11 0609) BP: (154-173)/(70-71) 157/70 mmHg (06/11 0609) SpO2:  [95 %-96 %] 96 % (06/11 0609)  Intake/Output from previous day:  Intake/Output Summary (Last 24 hours) at 03/10/14 0730 Last data filed at 03/10/14 0600  Gross per 24 hour  Intake    760 ml  Output    500 ml  Net    260 ml    Intake/Output this shift:    Labs:  Recent Labs  03/08/14 0445 03/09/14 0450 03/10/14 0413  HGB 10.1* 9.7* 9.9*    Recent Labs  03/09/14 0450 03/10/14 0413  WBC 16.3* 15.6*  RBC 3.26* 3.20*  HCT 29.5* 28.7*  PLT 147* 163    Recent Labs  03/08/14 0445 03/09/14 0450  NA 136* 137  K 4.8 4.5  CL 105 101  CO2 22 23  BUN 18 16  CREATININE 0.80 0.85  GLUCOSE 170* 160*  CALCIUM 8.7 9.2   No results found for this basename: LABPT, INR,  in the last 72 hours  EXAM General - Patient is Alert, Appropriate and Oriented Extremity - Neurologically intact Neurovascular intact Dressing/Incision - clean, dry, no drainage Motor Function - intact, moving foot and toes well on exam.   Past Medical History  Diagnosis Date  . Diabetes mellitus   . Hypercholesteremia   . Colon polyps   . Diverticulitis   . Anxiety   . H/O hiatal hernia   . Hypertension     Dr. Laurance Flatten  . History of shingles 01-07-13    3'14 recent outbreak- drying in   . Lumbar spondylosis     osteoarthritis-knees  . Hearing loss 01-07-13    bilateral hearing aids  . Macular degeneration of both eyes     and dry eyes  . Complication of anesthesia 01-18-2013    slow to awaken and felt crazy for 3-4 days     Assessment/Plan: 3 Days Post-Op Procedure(s) (LRB): RIGHT TOTAL KNEE ARTHROPLASTY (Right) Active Problems:   OA (osteoarthritis) of knee   Discharge home with home health after PT session this AM  DVT Prophylaxis - Xarelto Weight-Bearing as tolerated to right leg  Eileen Kangas V 03/10/2014, 7:30 AM

## 2014-03-11 NOTE — Discharge Summary (Signed)
Physician Discharge Summary   Patient ID: Kristy [name] MRN: [id number] DOB/AGE: [birth year] 80 y.o.  Admit date: 03/07/2014 Discharge date: 03/10/2014  Primary Diagnosis: Osteoarthritis, right knee  Admission Diagnoses:  Past Medical History  Diagnosis Date  . Diabetes mellitus   . Hypercholesteremia   . Colon polyps   . Diverticulitis   . Anxiety   . H/O hiatal hernia   . Hypertension     Dr. Laurance Flatten  . History of shingles 01-07-13    3'14 recent outbreak- drying in   . Lumbar spondylosis     osteoarthritis-knees  . Hearing loss 01-07-13    bilateral hearing aids  . Macular degeneration of both eyes     and dry eyes  . Complication of anesthesia 01-18-2013    slow to awaken and felt crazy for 3-4 days   Discharge Diagnoses:   Active Problems:   OA (osteoarthritis) of knee  Estimated body mass index is 31.3 kg/(m^2) as calculated from the following:   Height as of this encounter: 5' 2.5" (1.588 m).   Weight as of this encounter: 78.926 kg (174 lb).  Procedure:  Procedure(s) (LRB): RIGHT TOTAL KNEE ARTHROPLASTY (Right)   Consults: None  HPI: The patient is a 80 year old female who presents for follow up of their knee. The patient is being followed for their right knee pain and osteoarthritis. They are now weeks out from a Synvisc series. Symptoms reported today include: pain, difficulty ambulating and difficulty arising from chair, while the patient does not report symptoms of: pain at night. The patient feels that they are doing poorly. She states the injection only helped for about a month or so. Unfortunately the right knee is starting to get as bad as the left one was prior to when it was replaced. She said the left knee is doing very well. The right knee hurts her at all times. It is limiting what she can and cannot do. She is now ready to proceed the right side.  They have been treated conservatively in the past for the above stated problem and despite conservative  measures, they continue to have progressive pain and severe functional limitations and dysfunction. They have failed non-operative management including home exercise, medications, and injections. It is felt that they would benefit from undergoing total joint replacement. Risks and benefits of the procedure have been discussed with the patient and they elect to proceed with surgery. There are no active contraindications to surgery such as ongoing infection or rapidly progressive neurological disease.   Laboratory Data: Admission on 03/07/2014, Discharged on 03/10/2014  Component Date Value Ref Range Status  . Glucose-Capillary 03/07/2014 132* 70 - 99 mg/dL Final  . Glucose-Capillary 03/07/2014 134* 70 - 99 mg/dL Final  . Comment 1 03/07/2014 Documented in Chart   Final  . Comment 2 03/07/2014 Call MD NNP PA CNM   Final  . Glucose-Capillary 03/07/2014 102* 70 - 99 mg/dL Final  . Comment 1 03/07/2014 Documented in Chart   Final  . Comment 2 03/07/2014 Notify RN   Final  . Glucose-Capillary 03/07/2014 165* 70 - 99 mg/dL Final  . Comment 1 03/07/2014 Notify RN   Final  . Comment 2 03/07/2014 Documented in Chart   Final  . Glucose-Capillary 03/07/2014 229* 70 - 99 mg/dL Final  . Comment 1 03/07/2014 Notify RN   Final  . Comment 2 03/07/2014 Documented in Chart   Final  . WBC 03/08/2014 16.8* 4.0 - 10.5 K/uL Final  . RBC  03/08/2014 3.30* 3.87 - 5.11 MIL/uL Final  . Hemoglobin 03/08/2014 10.1* 12.0 - 15.0 g/dL Final  . HCT 03/08/2014 29.7* 36.0 - 46.0 % Final  . MCV 03/08/2014 90.0  78.0 - 100.0 fL Final  . MCH 03/08/2014 30.6  26.0 - 34.0 pg Final  . MCHC 03/08/2014 34.0  30.0 - 36.0 g/dL Final  . RDW 03/08/2014 13.2  11.5 - 15.5 % Final  . Platelets 03/08/2014 148* 150 - 400 K/uL Final  . Sodium 03/08/2014 136* 137 - 147 mEq/L Final  . Potassium 03/08/2014 4.8  3.7 - 5.3 mEq/L Final  . Chloride 03/08/2014 105  96 - 112 mEq/L Final  . CO2 03/08/2014 22  19 - 32 mEq/L Final  . Glucose, Bld  03/08/2014 170* 70 - 99 mg/dL Final  . BUN 03/08/2014 18  6 - 23 mg/dL Final  . Creatinine, Ser 03/08/2014 0.80  0.50 - 1.10 mg/dL Final  . Calcium 03/08/2014 8.7  8.4 - 10.5 mg/dL Final  . GFR calc non Af Amer 03/08/2014 66* >90 mL/min Final  . GFR calc Af Amer 03/08/2014 77* >90 mL/min Final   Comment: (NOTE)                          The eGFR has been calculated using the CKD EPI equation.                          This calculation has not been validated in all clinical situations.                          eGFR's persistently <90 mL/min signify possible Chronic Kidney                          Disease.  . Glucose-Capillary 03/07/2014 220* 70 - 99 mg/dL Final  . Glucose-Capillary 03/08/2014 148* 70 - 99 mg/dL Final  . Glucose-Capillary 03/08/2014 237* 70 - 99 mg/dL Final  . Comment 1 03/08/2014 Notify RN   Final  . Comment 2 03/08/2014 Documented in Chart   Final  . WBC 03/09/2014 16.3* 4.0 - 10.5 K/uL Final  . RBC 03/09/2014 3.26* 3.87 - 5.11 MIL/uL Final  . Hemoglobin 03/09/2014 9.7* 12.0 - 15.0 g/dL Final  . HCT 03/09/2014 29.5* 36.0 - 46.0 % Final  . MCV 03/09/2014 90.5  78.0 - 100.0 fL Final  . MCH 03/09/2014 29.8  26.0 - 34.0 pg Final  . MCHC 03/09/2014 32.9  30.0 - 36.0 g/dL Final  . RDW 03/09/2014 13.4  11.5 - 15.5 % Final  . Platelets 03/09/2014 147* 150 - 400 K/uL Final  . Sodium 03/09/2014 137  137 - 147 mEq/L Final  . Potassium 03/09/2014 4.5  3.7 - 5.3 mEq/L Final  . Chloride 03/09/2014 101  96 - 112 mEq/L Final  . CO2 03/09/2014 23  19 - 32 mEq/L Final  . Glucose, Bld 03/09/2014 160* 70 - 99 mg/dL Final  . BUN 03/09/2014 16  6 - 23 mg/dL Final  . Creatinine, Ser 03/09/2014 0.85  0.50 - 1.10 mg/dL Final  . Calcium 03/09/2014 9.2  8.4 - 10.5 mg/dL Final  . GFR calc non Af Amer 03/09/2014 62* >90 mL/min Final  . GFR calc Af Amer 03/09/2014 71* >90 mL/min Final   Comment: (NOTE)  The eGFR has been calculated using the CKD EPI equation.                           This calculation has not been validated in all clinical situations.                          eGFR's persistently <90 mL/min signify possible Chronic Kidney                          Disease.  . Glucose-Capillary 03/08/2014 282* 70 - 99 mg/dL Final  . Comment 1 03/08/2014 Notify RN   Final  . Comment 2 03/08/2014 Documented in Chart   Final  . Glucose-Capillary 03/08/2014 246* 70 - 99 mg/dL Final  . Glucose-Capillary 03/09/2014 142* 70 - 99 mg/dL Final  . Glucose-Capillary 03/09/2014 187* 70 - 99 mg/dL Final  . WBC 03/10/2014 15.6* 4.0 - 10.5 K/uL Final  . RBC 03/10/2014 3.20* 3.87 - 5.11 MIL/uL Final  . Hemoglobin 03/10/2014 9.9* 12.0 - 15.0 g/dL Final  . HCT 03/10/2014 28.7* 36.0 - 46.0 % Final  . MCV 03/10/2014 89.7  78.0 - 100.0 fL Final  . MCH 03/10/2014 30.9  26.0 - 34.0 pg Final  . MCHC 03/10/2014 34.5  30.0 - 36.0 g/dL Final  . RDW 03/10/2014 13.2  11.5 - 15.5 % Final  . Platelets 03/10/2014 163  150 - 400 K/uL Final  . Glucose-Capillary 03/09/2014 233* 70 - 99 mg/dL Final  . Glucose-Capillary 03/09/2014 202* 70 - 99 mg/dL Final  . Glucose-Capillary 03/10/2014 193* 70 - 99 mg/dL Final  . Comment 1 03/10/2014 Notify RN   Final  . Comment 2 03/10/2014 Documented in [facility] Outpatient Visit on 02/28/2014  Component Date Value Ref Range Status  . aPTT 02/28/2014 26  24 - 37 seconds Final  . WBC 02/28/2014 11.1* 4.0 - 10.5 K/uL Final  . RBC 02/28/2014 4.20  3.87 - 5.11 MIL/uL Final  . Hemoglobin 02/28/2014 12.6  12.0 - 15.0 g/dL Final  . HCT 02/28/2014 37.7  36.0 - 46.0 % Final  . MCV 02/28/2014 89.8  78.0 - 100.0 fL Final  . MCH 02/28/2014 30.0  26.0 - 34.0 pg Final  . MCHC 02/28/2014 33.4  30.0 - 36.0 g/dL Final  . RDW 02/28/2014 13.1  11.5 - 15.5 % Final  . Platelets 02/28/2014 189  150 - 400 K/uL Final  . Sodium 02/28/2014 139  137 - 147 mEq/L Final  . Potassium 02/28/2014 4.9  3.7 - 5.3 mEq/L Final  . Chloride 02/28/2014 101  96 - 112 mEq/L  Final  . CO2 02/28/2014 26  19 - 32 mEq/L Final  . Glucose, Bld 02/28/2014 352* 70 - 99 mg/dL Final  . BUN 02/28/2014 21  6 - 23 mg/dL Final  . Creatinine, Ser 02/28/2014 0.93  0.50 - 1.10 mg/dL Final  . Calcium 02/28/2014 10.1  8.4 - 10.5 mg/dL Final  . Total Protein 02/28/2014 6.5  6.0 - 8.3 g/dL Final  . Albumin 02/28/2014 3.7  3.5 - 5.2 g/dL Final  . AST 02/28/2014 14  0 - 37 U/L Final  . ALT 02/28/2014 12  0 - 35 U/L Final  . Alkaline Phosphatase 02/28/2014 37* 39 - 117 U/L Final  . Total Bilirubin 02/28/2014 0.4  0.3 - 1.2 mg/dL Final  . GFR calc non Af Amer 02/28/2014  55* >90 mL/min Final  . GFR calc Af Amer 02/28/2014 64* >90 mL/min Final   Comment: (NOTE)                          The eGFR has been calculated using the CKD EPI equation.                          This calculation has not been validated in all clinical situations.                          eGFR's persistently <90 mL/min signify possible Chronic Kidney                          Disease.  Marland Kitchen Prothrombin Time 02/28/2014 12.7  11.6 - 15.2 seconds Final  . INR 02/28/2014 0.97  0.00 - 1.49 Final  . ABO/RH(D) 02/28/2014 A POS   Final  . Antibody Screen 02/28/2014 NEG   Final  . Sample Expiration 02/28/2014 03/10/2014   Final  . Color, Urine 02/28/2014 YELLOW  YELLOW Final  . APPearance 02/28/2014 CLEAR  CLEAR Final  . Specific Gravity, Urine 02/28/2014 1.023  1.005 - 1.030 Final  . pH 02/28/2014 5.0  5.0 - 8.0 Final  . Glucose, UA 02/28/2014 >1000* NEGATIVE mg/dL Final  . Hgb urine dipstick 02/28/2014 NEGATIVE  NEGATIVE Final  . Bilirubin Urine 02/28/2014 NEGATIVE  NEGATIVE Final  . Ketones, ur 02/28/2014 NEGATIVE  NEGATIVE mg/dL Final  . Protein, ur 02/28/2014 NEGATIVE  NEGATIVE mg/dL Final  . Urobilinogen, UA 02/28/2014 0.2  0.0 - 1.0 mg/dL Final  . Nitrite 02/28/2014 NEGATIVE  NEGATIVE Final  . Leukocytes, UA 02/28/2014 NEGATIVE  NEGATIVE Final  . MRSA, PCR 02/28/2014 NEGATIVE  NEGATIVE Final  . Staphylococcus  aureus 02/28/2014 NEGATIVE  NEGATIVE Final   Comment:                                 The Xpert SA Assay (FDA                          approved for NASAL specimens                          in patients over 62 years of age),                          is one component of                          a comprehensive surveillance                          program.  Test performance has                          been validated by [employer] for patients greater  than or equal to 2 year old.                          It is not intended                          to diagnose infection nor to                          guide or monitor treatment.  . Squamous Epithelial / LPF 02/28/2014 RARE  RARE Final     X-Rays:No results found.  EKG: Orders placed in visit on 01/26/14  . EKG 12-LEAD     Hospital Course: JENIECE HANNIS is a 80 y.o. who was admitted to [facility]. They were brought to the operating room on 03/07/2014 and underwent Procedure(s): RIGHT TOTAL KNEE ARTHROPLASTY.  Patient tolerated the procedure well and was later transferred to the recovery room and then to the orthopaedic floor for postoperative care.  They were given PO and IV analgesics for pain control following their surgery.  They were given 24 hours of postoperative antibiotics of  Anti-infectives   Start     Dose/Rate Route Frequency Ordered Stop   03/07/14 1600  ceFAZolin (ANCEF) IVPB 2 g/50 mL premix     2 g 100 mL/hr over 30 Minutes Intravenous Every 6 hours 03/07/14 1242 03/07/14 2226   03/07/14 0638  ceFAZolin (ANCEF) IVPB 2 g/50 mL premix     2 g 100 mL/hr over 30 Minutes Intravenous On call to O.R. 03/07/14 0960 03/07/14 0949     and started on DVT prophylaxis in the form of Xarelto.   PT and OT were ordered for total joint protocol.  Discharge planning consulted to help with postop disposition and equipment needs.  Patient had a fair night on the evening of  surgery.  They started to get up OOB with therapy on day one. Hemovac drain was pulled without difficulty.  Continued to work with therapy into day two.  Dressing was changed on day two and the incision was clean and dry. She progressed slowly post op day two. By day three, the patient had progressed with therapy and meeting their goals.  Incision was healing well.  Patient was seen in rounds and was ready to go home.   Diet: Cardiac diet Activity:WBAT Follow-up:in 2 weeks Disposition - Home Discharged Condition: stable   Discharge Instructions   Call MD / Call 911    Complete by:  As directed   If you experience chest pain or shortness of breath, CALL 911 and be transported to the hospital emergency room.  If you develope a fever above 101 F, pus (white drainage) or increased drainage or redness at the wound, or calf pain, call your surgeon's office.     Change dressing    Complete by:  As directed   Change dressing daily with sterile 4 x 4 inch gauze dressing and apply TED hose.     Constipation Prevention    Complete by:  As directed   Drink plenty of fluids.  Prune juice may be helpful.  You may use a stool softener, such as Colace (over the counter) 100 mg twice a day.  Use MiraLax (over the counter) for constipation as needed.     Diet - low sodium heart healthy    Complete by:  As directed  Diet Carb Modified    Complete by:  As directed      Discharge instructions    Complete by:  As directed   Walk with your walker. Weight bearing as tolerated [place] will follow you at home for your therapy  Change your dressing daily. Shower only, no tub bath. Call if any temperatures greater than 101 or any wound complications: [phone]     Do not put a pillow under the knee. Place it under the heel.    Complete by:  As directed      Driving restrictions    Complete by:  As directed   No driving     Increase activity slowly as tolerated    Complete by:  As directed        TED hose    Complete by:  As directed   Use stockings (TED hose) for 3 weeks on both leg(s).  You may remove them at night for sleeping.            Medication List    STOP taking these medications       BLACK COHOSH PO     cholecalciferol 1000 UNITS tablet  Commonly known as:  VITAMIN D     Fish Oil Oil     ibuprofen 200 MG tablet  Commonly known as:  ADVIL,MOTRIN      TAKE these medications       ADVOCATE LANCETS Misc  1 Stick by Does not apply route 2 (two) times daily.     freestyle lancets  Use as instructed     ALPRAZolam 0.25 MG tablet  Commonly known as:  XANAX  TAKE 1/2 TABLET BY MOUTH TWICE A DAY OR 1 TABLET ONCE A DAY AS NEEDED     DSS 100 MG Caps  Take 100 mg by mouth 2 (two) times daily.     esomeprazole 40 MG capsule  Commonly known as:  NEXIUM  Take 1 capsule (40 mg total) by mouth daily.     estradiol 1 MG tablet  Commonly known as:  ESTRACE  Take 1 mg by mouth 2 (two) times daily.     ezetimibe 10 MG tablet  Commonly known as:  ZETIA  Take 5 mg by mouth daily.     fenofibrate micronized 134 MG capsule  Commonly known as:  LOFIBRA  Take 134 mg by mouth daily before breakfast.     furosemide 40 MG tablet  Commonly known as:  LASIX  Take 40 mg by mouth 2 (two) times daily as needed for fluid.     glucose blood test strip  Use as instructed     glucose blood test strip  Commonly known as:  FREESTYLE TEST STRIPS  Use as instructed     glucose monitoring kit monitoring kit  1 each by Does not apply route as needed for other. Dx: 250.0. Patient to check blood sugar twice a day and as needed.     glyBURIDE-metformin 5-500 MG per tablet  Commonly known as:  GLUCOVANCE  Take 1 tablet by mouth 2 (two) times daily with a meal.     hydroxypropyl methylcellulose 2.5 % ophthalmic solution  Commonly known as:  ISOPTO TEARS  Place 1 drop into both eyes 2 (two) times daily as needed (dry eyes).     lisinopril 10 MG tablet  Commonly known  as:  PRINIVIL,ZESTRIL  Take 5 mg by mouth every morning. As directed     methocarbamol 500 MG  tablet  Commonly known as:  ROBAXIN  Take 1 tablet (500 mg total) by mouth every 6 (six) hours as needed for muscle spasms.     metoprolol succinate 25 MG 24 hr tablet  Commonly known as:  TOPROL-XL  Take 25 mg by mouth every morning.     oxyCODONE 5 MG immediate release tablet  Commonly known as:  Oxy IR/ROXICODONE  Take 1-2 tablets (5-10 mg total) by mouth every 3 (three) hours as needed for breakthrough pain.     pravastatin 80 MG tablet  Commonly known as:  PRAVACHOL  TAKE ONE TABLET BY MOUTH AT BEDTIME     rivaroxaban 10 MG Tabs tablet  Commonly known as:  XARELTO  Take 1 tablet (10 mg total) by mouth daily with breakfast.     traMADol 50 MG tablet  Commonly known as:  ULTRAM  Take 1-2 tablets (50-100 mg total) by mouth every 6 (six) hours as needed for moderate pain.           Follow-up Information   Follow up with Gearlean Alf, MD. Schedule an appointment as soon as possible for a visit in 2 weeks.   Specialty:  Orthopedic Surgery   Contact information:   [address] [place] [address] [phone]       Signed: Ardeen Jourdain, PA-C Orthopaedic Surgery 03/11/2014, 10:41 AM

## 2014-03-15 ENCOUNTER — Telehealth: Payer: Self-pay | Admitting: Family Medicine

## 2014-03-15 NOTE — Telephone Encounter (Signed)
Home health called our office yesterday to verify the last A1C date and result. The last one resulted was 09/07/14 and it was 6.5. There was an order for one in 11/2013 but it was not resulted. According to our records Kristy [name] is due for an A1C and it would be beneficial for home health to obtain. I tried to contact the patient to explain this but Kristy [name] did not answer. Will try again at a later time.

## 2014-03-28 ENCOUNTER — Ambulatory Visit: Payer: Medicare Other | Attending: Orthopedic Surgery | Admitting: Physical Therapy

## 2014-03-28 DIAGNOSIS — Z96659 Presence of unspecified artificial knee joint: Secondary | ICD-10-CM | POA: Insufficient documentation

## 2014-03-28 DIAGNOSIS — M25569 Pain in unspecified knee: Secondary | ICD-10-CM | POA: Diagnosis not present

## 2014-03-28 DIAGNOSIS — R269 Unspecified abnormalities of gait and mobility: Secondary | ICD-10-CM | POA: Insufficient documentation

## 2014-03-28 DIAGNOSIS — R5381 Other malaise: Secondary | ICD-10-CM | POA: Diagnosis not present

## 2014-03-28 DIAGNOSIS — E119 Type 2 diabetes mellitus without complications: Secondary | ICD-10-CM | POA: Insufficient documentation

## 2014-03-28 DIAGNOSIS — M539 Dorsopathy, unspecified: Secondary | ICD-10-CM | POA: Insufficient documentation

## 2014-03-28 DIAGNOSIS — IMO0001 Reserved for inherently not codable concepts without codable children: Secondary | ICD-10-CM | POA: Insufficient documentation

## 2014-03-31 ENCOUNTER — Ambulatory Visit: Payer: Medicare Other | Attending: Orthopedic Surgery | Admitting: Physical Therapy

## 2014-03-31 DIAGNOSIS — R5381 Other malaise: Secondary | ICD-10-CM | POA: Insufficient documentation

## 2014-03-31 DIAGNOSIS — E119 Type 2 diabetes mellitus without complications: Secondary | ICD-10-CM | POA: Diagnosis not present

## 2014-03-31 DIAGNOSIS — IMO0001 Reserved for inherently not codable concepts without codable children: Secondary | ICD-10-CM | POA: Insufficient documentation

## 2014-03-31 DIAGNOSIS — M25569 Pain in unspecified knee: Secondary | ICD-10-CM | POA: Diagnosis not present

## 2014-03-31 DIAGNOSIS — Z96659 Presence of unspecified artificial knee joint: Secondary | ICD-10-CM | POA: Diagnosis not present

## 2014-03-31 DIAGNOSIS — R269 Unspecified abnormalities of gait and mobility: Secondary | ICD-10-CM | POA: Diagnosis not present

## 2014-03-31 DIAGNOSIS — M539 Dorsopathy, unspecified: Secondary | ICD-10-CM | POA: Diagnosis not present

## 2014-04-04 ENCOUNTER — Encounter: Payer: Medicare Other | Admitting: *Deleted

## 2014-04-06 ENCOUNTER — Ambulatory Visit: Payer: Medicare Other | Admitting: Physical Therapy

## 2014-04-06 ENCOUNTER — Other Ambulatory Visit: Payer: Self-pay | Admitting: Family Medicine

## 2014-04-06 DIAGNOSIS — R928 Other abnormal and inconclusive findings on diagnostic imaging of breast: Secondary | ICD-10-CM

## 2014-04-06 DIAGNOSIS — IMO0001 Reserved for inherently not codable concepts without codable children: Secondary | ICD-10-CM | POA: Diagnosis not present

## 2014-04-06 NOTE — Telephone Encounter (Signed)
Patient a1c was already drawn and lab was scanned into epic

## 2014-04-07 ENCOUNTER — Ambulatory Visit: Payer: Medicare Other | Admitting: Family Medicine

## 2014-04-10 ENCOUNTER — Other Ambulatory Visit: Payer: Self-pay | Admitting: Family Medicine

## 2014-04-12 ENCOUNTER — Ambulatory Visit: Payer: Medicare Other | Admitting: Physical Therapy

## 2014-04-12 DIAGNOSIS — IMO0001 Reserved for inherently not codable concepts without codable children: Secondary | ICD-10-CM | POA: Diagnosis not present

## 2014-04-14 ENCOUNTER — Encounter (INDEPENDENT_AMBULATORY_CARE_PROVIDER_SITE_OTHER): Payer: Self-pay

## 2014-04-14 ENCOUNTER — Ambulatory Visit
Admission: RE | Admit: 2014-04-14 | Discharge: 2014-04-14 | Disposition: A | Discharge status: Home / Self Care | Payer: Medicare Other | Source: Ambulatory Visit | Attending: Family Medicine | Admitting: Family Medicine

## 2014-04-14 DIAGNOSIS — R928 Other abnormal and inconclusive findings on diagnostic imaging of breast: Secondary | ICD-10-CM

## 2014-04-18 ENCOUNTER — Ambulatory Visit: Payer: Medicare Other | Admitting: Physical Therapy

## 2014-04-18 DIAGNOSIS — IMO0001 Reserved for inherently not codable concepts without codable children: Secondary | ICD-10-CM | POA: Diagnosis not present

## 2014-04-21 ENCOUNTER — Ambulatory Visit: Payer: Medicare Other | Admitting: Physical Therapy

## 2014-04-21 DIAGNOSIS — IMO0001 Reserved for inherently not codable concepts without codable children: Secondary | ICD-10-CM | POA: Diagnosis not present

## 2014-04-22 ENCOUNTER — Other Ambulatory Visit: Payer: Self-pay | Admitting: Family Medicine

## 2014-04-22 DIAGNOSIS — N6489 Other specified disorders of breast: Secondary | ICD-10-CM

## 2014-04-27 ENCOUNTER — Encounter: Payer: Medicare Other | Admitting: Physical Therapy

## 2014-04-28 ENCOUNTER — Other Ambulatory Visit: Payer: Self-pay | Admitting: Nurse Practitioner

## 2014-05-02 ENCOUNTER — Ambulatory Visit: Payer: Medicare Other | Admitting: Physical Therapy

## 2014-05-04 ENCOUNTER — Other Ambulatory Visit: Payer: Self-pay | Admitting: Nurse Practitioner

## 2014-05-05 ENCOUNTER — Ambulatory Visit: Payer: Medicare Other | Attending: Orthopedic Surgery | Admitting: *Deleted

## 2014-05-05 DIAGNOSIS — Z96659 Presence of unspecified artificial knee joint: Secondary | ICD-10-CM | POA: Diagnosis not present

## 2014-05-05 DIAGNOSIS — E119 Type 2 diabetes mellitus without complications: Secondary | ICD-10-CM | POA: Insufficient documentation

## 2014-05-05 DIAGNOSIS — IMO0001 Reserved for inherently not codable concepts without codable children: Secondary | ICD-10-CM | POA: Insufficient documentation

## 2014-05-05 DIAGNOSIS — M25569 Pain in unspecified knee: Secondary | ICD-10-CM | POA: Insufficient documentation

## 2014-05-05 DIAGNOSIS — R5381 Other malaise: Secondary | ICD-10-CM | POA: Insufficient documentation

## 2014-05-05 DIAGNOSIS — M539 Dorsopathy, unspecified: Secondary | ICD-10-CM | POA: Diagnosis not present

## 2014-05-05 DIAGNOSIS — R269 Unspecified abnormalities of gait and mobility: Secondary | ICD-10-CM | POA: Diagnosis not present

## 2014-05-11 ENCOUNTER — Encounter: Payer: Medicare Other | Admitting: Physical Therapy

## 2014-05-13 ENCOUNTER — Ambulatory Visit
Admission: RE | Admit: 2014-05-13 | Discharge: 2014-05-13 | Disposition: A | Discharge status: Home / Self Care | Payer: Medicare Other | Source: Ambulatory Visit | Attending: Family Medicine | Admitting: Family Medicine

## 2014-05-13 DIAGNOSIS — N6489 Other specified disorders of breast: Secondary | ICD-10-CM

## 2014-05-17 ENCOUNTER — Ambulatory Visit: Payer: Medicare Other | Admitting: *Deleted

## 2014-05-17 ENCOUNTER — Other Ambulatory Visit: Payer: Self-pay | Admitting: Family Medicine

## 2014-05-17 DIAGNOSIS — IMO0001 Reserved for inherently not codable concepts without codable children: Secondary | ICD-10-CM | POA: Diagnosis not present

## 2014-05-19 ENCOUNTER — Ambulatory Visit: Payer: Medicare Other | Admitting: Physical Therapy

## 2014-05-19 DIAGNOSIS — IMO0001 Reserved for inherently not codable concepts without codable children: Secondary | ICD-10-CM | POA: Diagnosis not present

## 2014-05-24 ENCOUNTER — Ambulatory Visit: Payer: Medicare Other | Admitting: *Deleted

## 2014-05-24 DIAGNOSIS — IMO0001 Reserved for inherently not codable concepts without codable children: Secondary | ICD-10-CM | POA: Diagnosis not present

## 2014-05-26 ENCOUNTER — Ambulatory Visit: Payer: Medicare Other | Admitting: Physical Therapy

## 2014-05-26 DIAGNOSIS — IMO0001 Reserved for inherently not codable concepts without codable children: Secondary | ICD-10-CM | POA: Diagnosis not present

## 2014-05-27 ENCOUNTER — Telehealth: Payer: Self-pay | Admitting: Family Medicine

## 2014-05-30 NOTE — Telephone Encounter (Signed)
Pt called

## 2014-06-09 ENCOUNTER — Other Ambulatory Visit (INDEPENDENT_AMBULATORY_CARE_PROVIDER_SITE_OTHER): Payer: Medicare Other

## 2014-06-09 DIAGNOSIS — I10 Essential (primary) hypertension: Secondary | ICD-10-CM

## 2014-06-09 DIAGNOSIS — R5383 Other fatigue: Secondary | ICD-10-CM

## 2014-06-09 DIAGNOSIS — E785 Hyperlipidemia, unspecified: Secondary | ICD-10-CM

## 2014-06-09 DIAGNOSIS — E559 Vitamin D deficiency, unspecified: Secondary | ICD-10-CM

## 2014-06-09 DIAGNOSIS — R5381 Other malaise: Secondary | ICD-10-CM

## 2014-06-09 DIAGNOSIS — E109 Type 1 diabetes mellitus without complications: Secondary | ICD-10-CM

## 2014-06-09 LAB — POCT CBC
Granulocyte percent: 42.5 %G (ref 37–80)
HEMATOCRIT: 36.4 % — AB (ref 37.7–47.9)
HEMOGLOBIN: 12 g/dL — AB (ref 12.2–16.2)
LYMPH, POC: 6.1 — AB (ref 0.6–3.4)
MCH: 28.9 pg (ref 27–31.2)
MCHC: 33.1 g/dL (ref 31.8–35.4)
MCV: 87.3 fL (ref 80–97)
MPV: 9 fL (ref 0–99.8)
POC GRANULOCYTE: 4.8 (ref 2–6.9)
POC LYMPH PERCENT: 54.6 %L — AB (ref 10–50)
Platelet Count, POC: 178 10*3/uL (ref 142–424)
RBC: 4.2 M/uL (ref 4.04–5.48)
RDW, POC: 14 %
WBC: 11.2 10*3/uL — AB (ref 4.6–10.2)

## 2014-06-09 LAB — POCT GLYCOSYLATED HEMOGLOBIN (HGB A1C): Hemoglobin A1C: 6.5

## 2014-06-10 LAB — HEPATIC FUNCTION PANEL
ALT: 10 IU/L (ref 0–32)
AST: 15 IU/L (ref 0–40)
Albumin: 3.9 g/dL (ref 3.5–4.7)
Alkaline Phosphatase: 32 IU/L — ABNORMAL LOW (ref 39–117)
BILIRUBIN DIRECT: 0.13 mg/dL (ref 0.00–0.40)
BILIRUBIN TOTAL: 0.3 mg/dL (ref 0.0–1.2)
TOTAL PROTEIN: 5.9 g/dL — AB (ref 6.0–8.5)

## 2014-06-10 LAB — NMR, LIPOPROFILE
CHOLESTEROL: 130 mg/dL (ref 100–199)
HDL Cholesterol by NMR: 64 mg/dL (ref >39)
HDL Particle Number: 52 umol/L (ref >=30.5)
LDL PARTICLE NUMBER: 641 nmol/L (ref <1000)
LDL SIZE: 20.4 nm (ref >20.5)
LDLC SERPL CALC-MCNC: 44 mg/dL (ref 0–99)
LP-IR Score: 56 — ABNORMAL HIGH (ref <=45)
SMALL LDL PARTICLE NUMBER: 427 nmol/L (ref <=527)
Triglycerides by NMR: 108 mg/dL (ref 0–149)

## 2014-06-10 LAB — VITAMIN D 25 HYDROXY (VIT D DEFICIENCY, FRACTURES): Vit D, 25-Hydroxy: 30 ng/mL (ref 30.0–100.0)

## 2014-06-10 LAB — BMP8+EGFR
BUN/Creatinine Ratio: 13 (ref 11–26)
BUN: 13 mg/dL (ref 8–27)
CO2: 21 mmol/L (ref 18–29)
Calcium: 10.4 mg/dL — ABNORMAL HIGH (ref 8.7–10.3)
Chloride: 104 mmol/L (ref 97–108)
Creatinine, Ser: 1 mg/dL (ref 0.57–1.00)
GFR, EST AFRICAN AMERICAN: 60 mL/min/{1.73_m2} (ref >59)
GFR, EST NON AFRICAN AMERICAN: 52 mL/min/{1.73_m2} — AB (ref >59)
GLUCOSE: 100 mg/dL — AB (ref 65–99)
Potassium: 4.6 mmol/L (ref 3.5–5.2)
Sodium: 141 mmol/L (ref 134–144)

## 2014-06-14 ENCOUNTER — Ambulatory Visit (INDEPENDENT_AMBULATORY_CARE_PROVIDER_SITE_OTHER): Payer: Medicare Other | Admitting: Family Medicine

## 2014-06-14 ENCOUNTER — Encounter: Payer: Self-pay | Admitting: Family Medicine

## 2014-06-14 VITALS — BP 118/67 | HR 64 | Temp 97.1°F | Ht 62.5 in | Wt 169.0 lb

## 2014-06-14 DIAGNOSIS — L989 Disorder of the skin and subcutaneous tissue, unspecified: Secondary | ICD-10-CM

## 2014-06-14 DIAGNOSIS — E559 Vitamin D deficiency, unspecified: Secondary | ICD-10-CM

## 2014-06-14 DIAGNOSIS — Z96659 Presence of unspecified artificial knee joint: Secondary | ICD-10-CM

## 2014-06-14 DIAGNOSIS — Z96651 Presence of right artificial knee joint: Secondary | ICD-10-CM

## 2014-06-14 DIAGNOSIS — I10 Essential (primary) hypertension: Secondary | ICD-10-CM

## 2014-06-14 DIAGNOSIS — E785 Hyperlipidemia, unspecified: Secondary | ICD-10-CM

## 2014-06-14 DIAGNOSIS — E119 Type 2 diabetes mellitus without complications: Secondary | ICD-10-CM

## 2014-06-14 LAB — POCT UA - MICROALBUMIN: Microalbumin Ur, POC: 20 mg/L

## 2014-06-14 MED ORDER — PRAVASTATIN SODIUM 80 MG PO TABS: ORAL_TABLET | ORAL | Status: DC

## 2014-06-14 NOTE — Progress Notes (Signed)
Subjective:    Patient ID: LACY SOFIA, female    DOB: [birth year], 80 y.o.   MRN: [id number]  HPI Pt here for follow up and management of chronic medical problems. Mother recently had her second knee replacement. This was done in June of this year. This time it is the right knee. Cephalad will be reviewed with her. All of this was good. Her hemoglobin A1c was 6.5% and the CBC was within normal limits except for slightly elevated white blood cell count which has been consistent over the past several months. A cholesterol numbers were excellent blood sugar was good at 100. The patient is in need of a pelvic exam and urine microalbumin. She has a couple of skin lesions which she would like for [place] to look at today.       Patient Active Problem List   Diagnosis Date Noted  . Depression 08/24/2013  . Essential hypertension, benign 06/08/2013  . Hyperlipidemia 06/08/2013  . Vitamin D deficiency 06/08/2013  . Diabetes mellitus type 2 controlled 06/08/2013  . OA (osteoarthritis) of knee 01/18/2013  . CLL (chronic lymphocytic leukemia) 06/17/2012   Outpatient Encounter Prescriptions as of 06/14/2014  Medication Sig  . Advocate Lancets MISC 1 Stick by Does not apply route 2 (two) times daily.  Marland Kitchen ALPRAZolam (XANAX) 0.25 MG tablet TAKE 1/2 TABLET BY MOUTH TWICE A DAY OR 1 TABLET ONCE A DAY AS NEEDED  . esomeprazole (NEXIUM) 40 MG capsule Take 1 capsule (40 mg total) by mouth daily.  Marland Kitchen estradiol (ESTRACE) 1 MG tablet Take 1 mg by mouth 2 (two) times daily.  Marland Kitchen ezetimibe (ZETIA) 10 MG tablet Take 5 mg by mouth daily.  . fenofibrate micronized (LOFIBRA) 134 MG capsule TAKE ONE CAPSULE BY MOUTH AT BEDTIME  . furosemide (LASIX) 40 MG tablet Take 40 mg by mouth 2 (two) times daily as needed for fluid.   Marland Kitchen glucose blood (FREESTYLE TEST STRIPS) test strip Use as instructed  . glucose blood test strip Use as instructed  . glucose monitoring kit (FREESTYLE) monitoring kit 1 each by Does not apply route  as needed for other. Dx: 250.0. Patient to check blood sugar twice a day and as needed.  . glyBURIDE-metformin (GLUCOVANCE) 5-500 MG per tablet TAKE 2 TABLETS BY MOUTH TWICE DAILY  . hydroxypropyl methylcellulose (ISOPTO TEARS) 2.5 % ophthalmic solution Place 1 drop into both eyes 2 (two) times daily as needed (dry eyes).  . Lancets (FREESTYLE) lancets Use as instructed  . lisinopril (PRINIVIL,ZESTRIL) 10 MG tablet TAKE ONE HALF TABLET BY MOUTH EVERY DAY  . metoprolol succinate (TOPROL-XL) 25 MG 24 hr tablet TAKE ONE TABLET BY MOUTH ONE TIME DAILY  . pravastatin (PRAVACHOL) 80 MG tablet TAKE ONE TABLET BY MOUTH AT BEDTIME  . [DISCONTINUED] docusate sodium 100 MG CAPS Take 100 mg by mouth 2 (two) times daily.  . methocarbamol (ROBAXIN) 500 MG tablet Take 1 tablet (500 mg total) by mouth every 6 (six) hours as needed for muscle spasms.  . traMADol (ULTRAM) 50 MG tablet Take 1-2 tablets (50-100 mg total) by mouth every 6 (six) hours as needed for moderate pain.  . [DISCONTINUED] oxyCODONE (OXY IR/ROXICODONE) 5 MG immediate release tablet Take 1-2 tablets (5-10 mg total) by mouth every 3 (three) hours as needed for breakthrough pain.  . [DISCONTINUED] rivaroxaban (XARELTO) 10 MG TABS tablet Take 1 tablet (10 mg total) by mouth daily with breakfast.    Review of Systems  Constitutional: Negative.   HENT: Negative.  Eyes: Negative.   Respiratory: Negative.   Cardiovascular: Negative.   Gastrointestinal: Negative.   Endocrine: Negative.   Genitourinary: Negative.   Musculoskeletal: Negative.   Skin: Negative.        Skin lesion on left ear and black head under lip  Allergic/Immunologic: Negative.   Neurological: Negative.   Hematological: Negative.   Psychiatric/Behavioral: Negative.        Objective:   Physical Exam  Nursing note and vitals reviewed. Constitutional: She is oriented to person, place, and time. She appears well-developed and well-nourished. No distress.  HENT:  Head:  Normocephalic and atraumatic.  Right Ear: External ear normal.  Left Ear: External ear normal.  Nose: Nose normal.  Mouth/Throat: Oropharynx is clear and moist.  The patient has a papular lesion with a wart at the tip of that on the left ear pinna  Eyes: Conjunctivae and EOM are normal. Pupils are equal, round, and reactive to light. Right eye exhibits no discharge. Left eye exhibits no discharge. No scleral icterus.  Neck: Normal range of motion. Neck supple. No JVD present. No thyromegaly present.  Cardiovascular: Normal rate, regular rhythm, normal heart sounds and intact distal pulses.  Exam reveals no gallop and no friction rub.   No murmur heard. At 72 per minute  Pulmonary/Chest: Effort normal and breath sounds normal. No respiratory distress. She has no wheezes. She has no rales. She exhibits no tenderness.  Abdominal: Soft. Bowel sounds are normal. She exhibits no mass. There is no tenderness. There is no rebound and no guarding.  Musculoskeletal: Normal range of motion. She exhibits edema. She exhibits no tenderness.  There is some edema of the right lower extremity secondary to the recent arthroplasty. The knee appears to be healing well and she has good flexion and extension  Lymphadenopathy:    She has no cervical adenopathy.  Neurological: She is alert and oriented to person, place, and time. She has normal reflexes. No cranial nerve deficit.  Skin: Skin is warm and dry. No rash noted.  Psychiatric: She has a normal mood and affect. Her behavior is normal. Judgment and thought content normal.   BP 118/67  Pulse 64  Temp(Src) 97.1 F (36.2 C) (Oral)  Ht 5' 2.5" (1.588 m)  Wt 169 lb (76.658 kg)  BMI 30.40 kg/m2        Assessment & Plan:  1. Vitamin D deficiency  2. Hyperlipidemia  3. Essential hypertension, benign  4. Type 2 diabetes mellitus without complication - POCT UA - Microalbumin  5. Skin lesion - Ambulatory referral to Dermatology  6. History of  total knee arthroplasty, right Meds ordered this encounter  Medications  . pravastatin (PRAVACHOL) 80 MG tablet    Sig: TAKE ONE TABLET BY MOUTH AT BEDTIME    Dispense:  90 tablet    Refill:  3   Patient Instructions  Continue physical therapy Don't forget to get your flu shot next month We will arrange an appointment with the dermatologist to remove the lesion from your left ear Continue to monitor blood sugars and blood pressures at home Be careful adenopathy herself at risk for falls Continue current medications Continue as aggressive therapeutic lifestyle changes as possible with diet and exercise                         Medicare Annual Wellness Visit  [facility] and the medical providers at [facility] strive to bring you the best medical care.  In doing so we not only want to address your current medical conditions and concerns but also to detect new conditions early and prevent illness, disease and health-related problems.    Medicare offers a yearly Wellness Visit which allows our clinical staff to assess your need for preventative services including immunizations, lifestyle education, counseling to decrease risk of preventable diseases and screening for fall risk and other medical concerns.    This visit is provided free of charge (no copay) for all Medicare recipients. The clinical pharmacists at [facility] have begun to conduct these Wellness Visits which will also include a thorough review of all your medications.    As you primary medical provider recommend that you make an appointment for your Annual Wellness Visit if you have not done so already this year.  You may set up this appointment before you leave today or you may call back ([phone]) and schedule an appointment.  Please make sure when you call that you mention that you are scheduling your Annual Wellness Visit with the clinical pharmacist so that the appointment may be  made for the proper length of time.      Arrie Senate MD

## 2014-06-14 NOTE — Addendum Note (Signed)
Addended by: Earlene Plater on: 06/14/2014 03:32 PM   Modules accepted: Orders

## 2014-06-14 NOTE — Patient Instructions (Addendum)
Continue physical therapy Don't forget to get your flu shot next month We will arrange an appointment with the dermatologist to remove the lesion from your left ear Continue to monitor blood sugars and blood pressures at home Be careful adenopathy herself at risk for falls Continue current medications Continue as aggressive therapeutic lifestyle changes as possible with diet and exercise                         Medicare Annual Wellness Visit  [facility] and the medical providers at [facility] strive to bring you the best medical care.  In doing so we not only want to address your current medical conditions and concerns but also to detect new conditions early and prevent illness, disease and health-related problems.    Medicare offers a yearly Wellness Visit which allows our clinical staff to assess your need for preventative services including immunizations, lifestyle education, counseling to decrease risk of preventable diseases and screening for fall risk and other medical concerns.    This visit is provided free of charge (no copay) for all Medicare recipients. The clinical pharmacists at [facility] have begun to conduct these Wellness Visits which will also include a thorough review of all your medications.    As you primary medical provider recommend that you make an appointment for your Annual Wellness Visit if you have not done so already this year.  You may set up this appointment before you leave today or you may call back ([phone]) and schedule an appointment.  Please make sure when you call that you mention that you are scheduling your Annual Wellness Visit with the clinical pharmacist so that the appointment may be made for the proper length of time.

## 2014-06-15 LAB — MICROALBUMIN, URINE: Microalbumin, Urine: 14.7 ug/mL (ref 0.0–17.0)

## 2014-06-16 ENCOUNTER — Telehealth: Payer: Self-pay | Admitting: Family Medicine

## 2014-06-16 NOTE — Telephone Encounter (Signed)
Message copied by Waverly Ferrari on Thu Jun 16, 2014  9:35 AM ------      Message from: Chipper Herb      Created: Thu Jun 16, 2014  7:07 AM       The office urine microalbumin was improved at lower than it was a year ago ------

## 2014-06-20 ENCOUNTER — Other Ambulatory Visit: Payer: Self-pay | Admitting: *Deleted

## 2014-06-20 MED ORDER — LISINOPRIL 10 MG PO TABS: ORAL_TABLET | ORAL | Status: DC

## 2014-07-11 ENCOUNTER — Telehealth: Payer: Self-pay | Admitting: Family Medicine

## 2014-07-18 ENCOUNTER — Ambulatory Visit (INDEPENDENT_AMBULATORY_CARE_PROVIDER_SITE_OTHER): Payer: Medicare Other

## 2014-07-18 DIAGNOSIS — Z23 Encounter for immunization: Secondary | ICD-10-CM

## 2014-07-21 ENCOUNTER — Other Ambulatory Visit: Payer: Self-pay | Admitting: General Practice

## 2014-07-27 ENCOUNTER — Other Ambulatory Visit: Payer: Self-pay | Admitting: Family Medicine

## 2014-07-31 ENCOUNTER — Other Ambulatory Visit: Payer: Self-pay | Admitting: Family Medicine

## 2014-08-02 ENCOUNTER — Encounter: Payer: Self-pay | Admitting: Nurse Practitioner

## 2014-08-02 ENCOUNTER — Ambulatory Visit (INDEPENDENT_AMBULATORY_CARE_PROVIDER_SITE_OTHER): Payer: Medicare Other | Admitting: Nurse Practitioner

## 2014-08-02 DIAGNOSIS — Z01419 Encounter for gynecological examination (general) (routine) without abnormal findings: Secondary | ICD-10-CM

## 2014-08-02 LAB — POCT URINALYSIS DIPSTICK
BILIRUBIN UA: NEGATIVE
GLUCOSE UA: NEGATIVE
Ketones, UA: NEGATIVE
Leukocytes, UA: NEGATIVE
Nitrite, UA: NEGATIVE
Protein, UA: NEGATIVE
SPEC GRAV UA: 1.025
Urobilinogen, UA: NEGATIVE
pH, UA: 5

## 2014-08-02 LAB — POCT UA - MICROSCOPIC ONLY
Casts, Ur, LPF, POC: NEGATIVE
Crystals, Ur, HPF, POC: NEGATIVE
Mucus, UA: NEGATIVE
WBC, UR, HPF, POC: 1.3
Yeast, UA: NEGATIVE

## 2014-08-02 NOTE — Patient Instructions (Signed)

## 2014-08-02 NOTE — Telephone Encounter (Signed)
rx called into pharmacy for alprazolam

## 2014-08-02 NOTE — Telephone Encounter (Signed)
This is okay to refill 

## 2014-08-02 NOTE — Progress Notes (Signed)
   Subjective:    Patient ID: Kristy [name], female    DOB: 1928/09/08, 80 y.o.   MRN: [id number]  HPI Ms. Wardrip is a regular patient of Dr. Laurance Flatten that just saw him for follow up several months ago- She is here today for Pap and breast exam. SHe is doing well today with out complaints.    Review of Systems  Constitutional: Negative.   HENT: Negative.   Respiratory: Negative.   Cardiovascular: Negative.   Neurological: Negative.   Psychiatric/Behavioral: Negative.   All other systems reviewed and are negative.      Objective:   Physical Exam  Constitutional: She is oriented to person, place, and time. She appears well-developed and well-nourished.  HENT:  Head: Normocephalic.  Right Ear: Hearing, tympanic membrane, external ear and ear canal normal.  Left Ear: Hearing, tympanic membrane, external ear and ear canal normal.  Nose: Nose normal.  Mouth/Throat: Uvula is midline and oropharynx is clear and moist.  Eyes: Conjunctivae and EOM are normal. Pupils are equal, round, and reactive to light.  Neck: Normal range of motion and full passive range of motion without pain. Neck supple. No JVD present. Carotid bruit is not present. No thyroid mass and no thyromegaly present.  Cardiovascular: Normal rate, normal heart sounds and intact distal pulses.   No murmur heard. Pulmonary/Chest: Effort normal and breath sounds normal. Right breast exhibits no inverted nipple, no mass, no nipple discharge, no skin change and no tenderness. Left breast exhibits no inverted nipple, no mass, no nipple discharge, no skin change and no tenderness.  Abdominal: Soft. Bowel sounds are normal. She exhibits no mass. There is no tenderness.  Genitourinary: Vagina normal and uterus normal. No breast swelling, tenderness, discharge or bleeding.  bimanual exam-No adnexal masses or tenderness. Vaginal cuff intact  Musculoskeletal: Normal range of motion.  Lymphadenopathy:    She has no cervical adenopathy.    Neurological: She is alert and oriented to person, place, and time.  Skin: Skin is warm and dry.  Psychiatric: She has a normal mood and affect. Her behavior is normal. Judgment and thought content normal.    BP 114/67 mmHg  Pulse 76  Temp(Src) 97.4 F (36.3 C) (Oral)  Ht 5' 2.5" (1.588 m)  Wt 172 lb (78.019 kg)  BMI 30.94 kg/m2       Assessment & Plan:   1. Encounter for routine gynecological examination    Pap ordered Keep follow up appointment with Dr. Mayra Neer, FNP

## 2014-08-02 NOTE — Telephone Encounter (Signed)
Last filled 05/17/14, last seen 06/14/14. Route to pool. Nurse call into [facility]

## 2014-08-03 LAB — PAP IG (IMAGE GUIDED): PAP SMEAR COMMENT: 0

## 2014-08-04 ENCOUNTER — Other Ambulatory Visit: Payer: Self-pay | Admitting: Nurse Practitioner

## 2014-08-04 MED ORDER — FLUCONAZOLE 150 MG PO TABS: 150.0000 mg | ORAL_TABLET | Freq: Once | ORAL | Status: DC

## 2014-09-19 ENCOUNTER — Other Ambulatory Visit: Payer: Self-pay | Admitting: Nurse Practitioner

## 2014-10-04 ENCOUNTER — Other Ambulatory Visit: Payer: Self-pay | Admitting: Family Medicine

## 2014-10-11 ENCOUNTER — Other Ambulatory Visit: Payer: Self-pay | Admitting: Family Medicine

## 2014-10-11 NOTE — Telephone Encounter (Signed)
Refilled. Next visit sched for 10/20/14

## 2014-10-19 ENCOUNTER — Other Ambulatory Visit (INDEPENDENT_AMBULATORY_CARE_PROVIDER_SITE_OTHER): Payer: Medicare Other

## 2014-10-19 DIAGNOSIS — I10 Essential (primary) hypertension: Secondary | ICD-10-CM | POA: Diagnosis not present

## 2014-10-19 DIAGNOSIS — E559 Vitamin D deficiency, unspecified: Secondary | ICD-10-CM

## 2014-10-19 DIAGNOSIS — E119 Type 2 diabetes mellitus without complications: Secondary | ICD-10-CM | POA: Diagnosis not present

## 2014-10-19 DIAGNOSIS — G933 Postviral fatigue syndrome: Secondary | ICD-10-CM | POA: Diagnosis not present

## 2014-10-19 LAB — POCT GLYCOSYLATED HEMOGLOBIN (HGB A1C)

## 2014-10-19 NOTE — Addendum Note (Signed)
Addended by: Pollyann Kennedy F on: 10/19/2014 10:34 AM   Modules accepted: Orders

## 2014-10-19 NOTE — Addendum Note (Signed)
Addended by: Pollyann Kennedy F on: 10/19/2014 10:35 AM   Modules accepted: Orders

## 2014-10-19 NOTE — Progress Notes (Signed)
LAB ONLY 

## 2014-10-20 ENCOUNTER — Telehealth: Payer: Self-pay | Admitting: Medical Oncology

## 2014-10-20 ENCOUNTER — Ambulatory Visit (INDEPENDENT_AMBULATORY_CARE_PROVIDER_SITE_OTHER): Payer: Medicare Other | Admitting: Family Medicine

## 2014-10-20 ENCOUNTER — Encounter: Payer: Self-pay | Admitting: Family Medicine

## 2014-10-20 VITALS — BP 109/65 | HR 76 | Temp 97.9°F | Ht 62.5 in | Wt 170.0 lb

## 2014-10-20 DIAGNOSIS — K469 Unspecified abdominal hernia without obstruction or gangrene: Secondary | ICD-10-CM | POA: Diagnosis not present

## 2014-10-20 DIAGNOSIS — I10 Essential (primary) hypertension: Secondary | ICD-10-CM | POA: Diagnosis not present

## 2014-10-20 DIAGNOSIS — R29898 Other symptoms and signs involving the musculoskeletal system: Secondary | ICD-10-CM | POA: Diagnosis not present

## 2014-10-20 DIAGNOSIS — C9110 Chronic lymphocytic leukemia of B-cell type not having achieved remission: Secondary | ICD-10-CM | POA: Diagnosis not present

## 2014-10-20 DIAGNOSIS — E119 Type 2 diabetes mellitus without complications: Secondary | ICD-10-CM | POA: Diagnosis not present

## 2014-10-20 LAB — NMR, LIPOPROFILE
Cholesterol: 139 mg/dL (ref 100–199)
HDL Cholesterol by NMR: 56 mg/dL (ref >39)
HDL Particle Number: 49.3 umol/L (ref >=30.5)
LDL Particle Number: 880 nmol/L (ref <1000)
LDL Size: 19.8 nm (ref >20.5)
LDL-C: 54 mg/dL (ref 0–99)
LP-IR SCORE: 75 — AB (ref <=45)
Small LDL Particle Number: 661 nmol/L — ABNORMAL HIGH (ref <=527)
Triglycerides by NMR: 145 mg/dL (ref 0–149)

## 2014-10-20 LAB — HEPATIC FUNCTION PANEL
ALT: 7 IU/L (ref 0–32)
AST: 15 IU/L (ref 0–40)
Albumin: 4.2 g/dL (ref 3.5–4.7)
Alkaline Phosphatase: 33 IU/L — ABNORMAL LOW (ref 39–117)
Bilirubin, Direct: 0.19 mg/dL (ref 0.00–0.40)
Total Bilirubin: 0.4 mg/dL (ref 0.0–1.2)
Total Protein: 6.4 g/dL (ref 6.0–8.5)

## 2014-10-20 LAB — BMP8+EGFR
BUN/Creatinine Ratio: 19 (ref 11–26)
BUN: 21 mg/dL (ref 8–27)
CALCIUM: 10 mg/dL (ref 8.7–10.3)
CHLORIDE: 99 mmol/L (ref 97–108)
CO2: 22 mmol/L (ref 18–29)
CREATININE: 1.1 mg/dL — AB (ref 0.57–1.00)
GFR calc Af Amer: 53 mL/min/{1.73_m2} — ABNORMAL LOW (ref >59)
GFR calc non Af Amer: 46 mL/min/{1.73_m2} — ABNORMAL LOW (ref >59)
Glucose: 150 mg/dL — ABNORMAL HIGH (ref 65–99)
Potassium: 4.7 mmol/L (ref 3.5–5.2)
SODIUM: 138 mmol/L (ref 134–144)

## 2014-10-20 LAB — CBC WITH DIFFERENTIAL
Basophils Absolute: 0 10*3/uL (ref 0.0–0.2)
Basos: 0 %
EOS: 1 %
Eosinophils Absolute: 0.2 10*3/uL (ref 0.0–0.4)
HCT: 40.2 % (ref 34.0–46.6)
HEMOGLOBIN: 13.7 g/dL (ref 11.1–15.9)
Immature Grans (Abs): 0 10*3/uL (ref 0.0–0.1)
Immature Granulocytes: 0 %
LYMPHS: 58 %
Lymphocytes Absolute: 8.2 10*3/uL — ABNORMAL HIGH (ref 0.7–3.1)
MCH: 30.3 pg (ref 26.6–33.0)
MCHC: 34.1 g/dL (ref 31.5–35.7)
MCV: 89 fL (ref 79–97)
MONOS ABS: 1.1 10*3/uL — AB (ref 0.1–0.9)
Monocytes: 7 %
NEUTROS PCT: 34 %
Neutrophils Absolute: 4.9 10*3/uL (ref 1.4–7.0)
Platelets: 207 10*3/uL (ref 150–379)
RBC: 4.52 x10E6/uL (ref 3.77–5.28)
RDW: 13.6 % (ref 12.3–15.4)
WBC: 14.4 10*3/uL — ABNORMAL HIGH (ref 3.4–10.8)

## 2014-10-20 LAB — VITAMIN D 25 HYDROXY (VIT D DEFICIENCY, FRACTURES): VIT D 25 HYDROXY: 27.2 ng/mL — AB (ref 30.0–100.0)

## 2014-10-20 NOTE — Progress Notes (Signed)
Subjective:    Patient ID: Kristy [name], female    DOB: 1927/11/08, 81 y.o.   MRN: [id number]  HPI Pt here for follow up and management of chronic medical problems which include hyperlipidemia, diabetes, and CLL. She is taking medications regularly. The patient is complaining of some fatigue today. Her lab work has been reviewed and it will be reviewed with her during the visit. Her vitamin D level was low and we will make sure that she is taking a low-dose of vitamin D every day. Her white count is elevated and this goes along with her CLL. We will make sure that she sees the hematologist again for routine follow-up. Hemoglobin A1c was 7.2% and we will discuss with her about doing better with her diet and getting more exercise. The creatinine was minimally elevated and we will continue to monitor this and this was explained to her. Liver function test and cholesterol numbers were good. The patient says she's been feeling well and has not been following her diet as closely as she should and has gained 45 pounds. She also indicates that she is going to try to do better and get some of the weight off. She still has problems with her knees mostly just getting up out of the chair but minimal pain. She is discuss this with her orthopedic surgeon he thinks it was just take some more time for this to get better. She has not been taking her vitamin D regularly and will try to do better with this. Because of the fatigue we will add a thyroid profile to the blood work as RD been drawn.       Patient Active Problem List   Diagnosis Date Noted  . Depression 08/24/2013  . Essential hypertension, benign 06/08/2013  . Hyperlipidemia 06/08/2013  . Vitamin D deficiency 06/08/2013  . Diabetes mellitus type 2 controlled 06/08/2013  . OA (osteoarthritis) of knee 01/18/2013  . CLL (chronic lymphocytic leukemia) 06/17/2012   Outpatient Encounter Prescriptions as of 10/20/2014  Medication Sig  . Advocate  Lancets MISC 1 Stick by Does not apply route 2 (two) times daily.  Marland Kitchen ALPRAZolam (XANAX) 0.25 MG tablet TAKE 1/2 TABLET BY MOUTH TWICE DAILY AS NEEDED OR TAKE 1 TABLET DAILY AS NEEDED  . esomeprazole (NEXIUM) 40 MG capsule Take 1 capsule (40 mg total) by mouth daily.  Marland Kitchen estradiol (ESTRACE) 1 MG tablet TAKE ONE TABLET BY MOUTH TWICE DAILY  . fenofibrate micronized (LOFIBRA) 134 MG capsule TAKE ONE CAPSULE BY MOUTH AT BEDTIME  . furosemide (LASIX) 40 MG tablet Take 40 mg by mouth 2 (two) times daily as needed for fluid.   Marland Kitchen glucose blood (FREESTYLE TEST STRIPS) test strip Use as instructed  . glyBURIDE-metformin (GLUCOVANCE) 5-500 MG per tablet TAKE 2 TABLETS BY MOUTH TWICE DAILY  . hydroxypropyl methylcellulose (ISOPTO TEARS) 2.5 % ophthalmic solution Place 1 drop into both eyes 2 (two) times daily as needed (dry eyes).  Marland Kitchen lisinopril (PRINIVIL,ZESTRIL) 10 MG tablet TAKE ONE HALF TABLET BY MOUTH EVERY DAY  . metoprolol succinate (TOPROL-XL) 25 MG 24 hr tablet TAKE ONE TABLET BY MOUTH ONE TIME DAILY  . pravastatin (PRAVACHOL) 80 MG tablet TAKE ONE TABLET BY MOUTH AT BEDTIME  . traMADol (ULTRAM) 50 MG tablet Take 1-2 tablets (50-100 mg total) by mouth every 6 (six) hours as needed for moderate pain.  Marland Kitchen ZETIA 10 MG tablet TAKE ONE TABLET BY MOUTH EVERY DAY AS DIRECTED  . [DISCONTINUED] glucose blood test strip Use  as instructed  . [DISCONTINUED] fluconazole (DIFLUCAN) 150 MG tablet Take 1 tablet (150 mg total) by mouth once.  . [DISCONTINUED] glucose monitoring kit (FREESTYLE) monitoring kit 1 each by Does not apply route as needed for other. Dx: 250.0. Patient to check blood sugar twice a day and as needed.  . [DISCONTINUED] Lancets (FREESTYLE) lancets Use as instructed  . [DISCONTINUED] methocarbamol (ROBAXIN) 500 MG tablet Take 1 tablet (500 mg total) by mouth every 6 (six) hours as needed for muscle spasms.    Review of Systems  Constitutional: Positive for fatigue.  HENT: Negative.   Eyes:  Negative.   Respiratory: Negative.   Cardiovascular: Negative.   Gastrointestinal: Negative.   Endocrine: Negative.   Genitourinary: Negative.   Musculoskeletal: Negative.   Skin: Negative.   Allergic/Immunologic: Negative.   Neurological: Negative.   Hematological: Negative.   Psychiatric/Behavioral: Negative.        Objective:   Physical Exam  Constitutional: She is oriented to person, place, and time. She appears well-developed and well-nourished. No distress.  HENT:  Head: Normocephalic and atraumatic.  Right Ear: External ear normal.  Left Ear: External ear normal.  Nose: Nose normal.  Mouth/Throat: Oropharynx is clear and moist.  Eyes: Conjunctivae and EOM are normal. Pupils are equal, round, and reactive to light. Right eye exhibits no discharge. Left eye exhibits no discharge. No scleral icterus.  Neck: Normal range of motion. Neck supple. No JVD present. No thyromegaly present.  There are no carotid bruits or anterior cervical adenopathy.  Cardiovascular: Normal rate, regular rhythm, normal heart sounds and intact distal pulses.  Exam reveals no gallop and no friction rub.   No murmur heard. The heart has a regular rate and rhythm at 72/m without murmurs or gallops  Pulmonary/Chest: Effort normal and breath sounds normal. No respiratory distress. She has no wheezes. She has no rales. She exhibits no tenderness.  Abdominal: Soft. Bowel sounds are normal. She exhibits no mass. There is no tenderness. There is no rebound and no guarding.  The patient has a ventral hernia and a gallbladder incision scar. She also has a small umbilical hernia. This is not giving her much trouble at the present time and she is going to continue to live with this for now.  Musculoskeletal: Normal range of motion. She exhibits no edema or tenderness.  Weakness in the knees with arising but no pain and good mobility  Lymphadenopathy:    She has no cervical adenopathy.  Neurological: She is alert  and oriented to person, place, and time. She has normal reflexes. No cranial nerve deficit.  Skin: Skin is warm and dry. No rash noted.  Psychiatric: She has a normal mood and affect. Her behavior is normal. Judgment and thought content normal.  Nursing note and vitals reviewed.  BP 109/65 mmHg  Pulse 76  Temp(Src) 97.9 F (36.6 C) (Oral)  Ht 5' 2.5" (1.588 m)  Wt 170 lb (77.111 kg)  BMI 30.58 kg/m2        Assessment & Plan:  1. Essential hypertension, benign -Continue lisinopril -Reduce NSAID intake -Decrease sodium intake  2. Type 2 diabetes mellitus without complication -Continue to exercise as regularly as possible and try to lose some of the weight put on over Christmas -Continue glyburide/metformin combination as doing  3. CLL (chronic lymphocytic leukemia) -Call hematology about the need for follow-up  4. Abdominal hernia, recurrence not specified, unspecified hernia type -Readdress this incisional hernia if problems with pain occur  5. Weakness of both  lower extremities -Increase flexion and extension exercises to strengthen knees  Patient Instructions                       Medicare Annual Wellness Visit  [facility] and the medical providers at [facility] strive to bring you the best medical care.  In doing so we not only want to address your current medical conditions and concerns but also to detect new conditions early and prevent illness, disease and health-related problems.    Medicare offers a yearly Wellness Visit which allows our clinical staff to assess your need for preventative services including immunizations, lifestyle education, counseling to decrease risk of preventable diseases and screening for fall risk and other medical concerns.    This visit is provided free of charge (no copay) for all Medicare recipients. The clinical pharmacists at [facility] have begun to conduct these Wellness Visits which  will also include a thorough review of all your medications.    As you primary medical provider recommend that you make an appointment for your Annual Wellness Visit if you have not done so already this year.  You may set up this appointment before you leave today or you may call back ([phone]) and schedule an appointment.  Please make sure when you call that you mention that you are scheduling your Annual Wellness Visit with the clinical pharmacist so that the appointment may be made for the proper length of time.     Continue current medications. Continue good therapeutic lifestyle changes which include good diet and exercise. Fall precautions discussed with patient. If an FOBT was given today- please return it to our front desk. If you are over 47 years old - you may need Prevnar 28 or the adult Pneumonia vaccine.  Flu Shots will be available at our office starting mid- September. Please call and schedule a FLU CLINIC APPOINTMENT.   The patient was instructed to call the hematologist to see if he needs to see her again because of the chronic lymphocytic leukemia and we will abide by his recommendation. She was asked to try to exercise her knees regularly using a pedal bike this is on the floor or maybe some pull-up exercises using her walker and doing flexion and extension exercises. She was reminded to be careful not to fall. She is going to try to lose about 45 pounds of the weight that she gained over Christmas. She has been taking more anti-inflammatory medicines than usual and will reduce the quantity of these pills and hopefully by the next time we check her creatinine that will be improved.   Arrie Senate MD

## 2014-10-20 NOTE — Patient Instructions (Addendum)
Medicare Annual Wellness Visit  [facility] and the medical providers at [facility] strive to bring you the best medical care.  In doing so we not only want to address your current medical conditions and concerns but also to detect new conditions early and prevent illness, disease and health-related problems.    Medicare offers a yearly Wellness Visit which allows our clinical staff to assess your need for preventative services including immunizations, lifestyle education, counseling to decrease risk of preventable diseases and screening for fall risk and other medical concerns.    This visit is provided free of charge (no copay) for all Medicare recipients. The clinical pharmacists at [facility] have begun to conduct these Wellness Visits which will also include a thorough review of all your medications.    As you primary medical provider recommend that you make an appointment for your Annual Wellness Visit if you have not done so already this year.  You may set up this appointment before you leave today or you may call back ([phone]) and schedule an appointment.  Please make sure when you call that you mention that you are scheduling your Annual Wellness Visit with the clinical pharmacist so that the appointment may be made for the proper length of time.     Continue current medications. Continue good therapeutic lifestyle changes which include good diet and exercise. Fall precautions discussed with patient. If an FOBT was given today- please return it to our front desk. If you are over 40 years old - you may need Prevnar 44 or the adult Pneumonia vaccine.  Flu Shots will be available at our office starting mid- September. Please call and schedule a FLU CLINIC APPOINTMENT.   The patient was instructed to call the hematologist to see if he needs to see her again because of the chronic lymphocytic leukemia and we will abide by  his recommendation. She was asked to try to exercise her knees regularly using a pedal bike this is on the floor or maybe some pull-up exercises using her walker and doing flexion and extension exercises. She was reminded to be careful not to fall. She is going to try to lose about 45 pounds of the weight that she gained over Christmas. She has been taking more anti-inflammatory medicines than usual and will reduce the quantity of these pills and hopefully by the next time we check her creatinine that will be improved.

## 2014-10-20 NOTE — Telephone Encounter (Signed)
Wants Mohamed to see pt again for elevated WBC. Note to [place].

## 2014-10-21 LAB — THYROID PANEL WITH TSH
Free Thyroxine Index: 2.3 (ref 1.2–4.9)
T3 Uptake Ratio: 27 % (ref 24–39)
T4 TOTAL: 8.7 ug/dL (ref 4.5–12.0)
TSH: 2.2 u[IU]/mL (ref 0.450–4.500)

## 2014-10-21 LAB — SPECIMEN STATUS REPORT

## 2014-10-24 ENCOUNTER — Other Ambulatory Visit: Payer: Self-pay | Admitting: Medical Oncology

## 2014-10-24 ENCOUNTER — Telehealth: Payer: Self-pay | Admitting: Internal Medicine

## 2014-10-24 ENCOUNTER — Telehealth: Payer: Self-pay | Admitting: Family Medicine

## 2014-10-24 DIAGNOSIS — C9110 Chronic lymphocytic leukemia of B-cell type not having achieved remission: Secondary | ICD-10-CM

## 2014-10-24 NOTE — Telephone Encounter (Signed)
Left message to confirm appointment for February.

## 2014-11-02 ENCOUNTER — Other Ambulatory Visit: Payer: Self-pay | Admitting: Family Medicine

## 2014-11-10 ENCOUNTER — Encounter: Payer: Self-pay | Admitting: *Deleted

## 2014-11-16 ENCOUNTER — Ambulatory Visit (HOSPITAL_BASED_OUTPATIENT_CLINIC_OR_DEPARTMENT_OTHER): Payer: Medicare Other | Admitting: Internal Medicine

## 2014-11-16 ENCOUNTER — Encounter: Payer: Self-pay | Admitting: Internal Medicine

## 2014-11-16 ENCOUNTER — Other Ambulatory Visit: Payer: Self-pay | Admitting: Family Medicine

## 2014-11-16 ENCOUNTER — Other Ambulatory Visit (HOSPITAL_BASED_OUTPATIENT_CLINIC_OR_DEPARTMENT_OTHER): Payer: Medicare Other

## 2014-11-16 VITALS — BP 136/50 | HR 71 | Temp 97.9°F | Resp 18 | Ht 62.5 in | Wt 173.4 lb

## 2014-11-16 DIAGNOSIS — C9110 Chronic lymphocytic leukemia of B-cell type not having achieved remission: Secondary | ICD-10-CM

## 2014-11-16 LAB — CBC WITH DIFFERENTIAL/PLATELET
BASO%: 0.2 % (ref 0.0–2.0)
BASOS ABS: 0 10*3/uL (ref 0.0–0.1)
EOS%: 1.4 % (ref 0.0–7.0)
Eosinophils Absolute: 0.2 10*3/uL (ref 0.0–0.5)
HEMATOCRIT: 39 % (ref 34.8–46.6)
HEMOGLOBIN: 13 g/dL (ref 11.6–15.9)
LYMPH%: 51.7 % — AB (ref 14.0–49.7)
MCH: 30.4 pg (ref 25.1–34.0)
MCHC: 33.3 g/dL (ref 31.5–36.0)
MCV: 91.1 fL (ref 79.5–101.0)
MONO#: 1 10*3/uL — ABNORMAL HIGH (ref 0.1–0.9)
MONO%: 8.4 % (ref 0.0–14.0)
NEUT#: 4.8 10*3/uL (ref 1.5–6.5)
NEUT%: 38.3 % — AB (ref 38.4–76.8)
PLATELETS: 179 10*3/uL (ref 145–400)
RBC: 4.28 10*6/uL (ref 3.70–5.45)
RDW: 13.5 % (ref 11.2–14.5)
WBC: 12.5 10*3/uL — ABNORMAL HIGH (ref 3.9–10.3)
lymph#: 6.4 10*3/uL — ABNORMAL HIGH (ref 0.9–3.3)

## 2014-11-16 LAB — COMPREHENSIVE METABOLIC PANEL (CC13)
ALT: 9 U/L (ref 0–55)
AST: 11 U/L (ref 5–34)
Albumin: 3.5 g/dL (ref 3.5–5.0)
Alkaline Phosphatase: 36 U/L — ABNORMAL LOW (ref 40–150)
Anion Gap: 8 mEq/L (ref 3–11)
BUN: 16.6 mg/dL (ref 7.0–26.0)
CALCIUM: 9.5 mg/dL (ref 8.4–10.4)
CHLORIDE: 109 meq/L (ref 98–109)
CO2: 24 meq/L (ref 22–29)
CREATININE: 1.1 mg/dL (ref 0.6–1.1)
EGFR: 44 mL/min/{1.73_m2} — AB (ref >90)
GLUCOSE: 242 mg/dL — AB (ref 70–140)
Potassium: 4.5 mEq/L (ref 3.5–5.1)
Sodium: 141 mEq/L (ref 136–145)
Total Bilirubin: 0.49 mg/dL (ref 0.20–1.20)
Total Protein: 6 g/dL — ABNORMAL LOW (ref 6.4–8.3)

## 2014-11-16 NOTE — Telephone Encounter (Signed)
This is okay to refill for 3 months 

## 2014-11-16 NOTE — Progress Notes (Signed)
[facility] Telephone:([phone]) [phone]   Fax:(336) [phone]  OFFICE PROGRESS NOTE  Redge Gainer, MD [place] [address]  DIAGNOSIS: Stage 0 chronic lymphocytic leukemia  PRIOR THERAPY: None  CURRENT THERAPY: Observation.  INTERVAL HISTORY: Kristy [name] 81 y.o. female returns to the clinic today for routine followup visit. She was last seen in September 2013. She sees her primary care physician Dr. Laurance Flatten every 3 months because of her diabetes mellitus. He noticed that her white blood count has increased recently and he referred her back to me for evaluation. The patient is feeling fine today with no specific complaints. The patient denied having any significant weight loss or night sweats. She has no significant chest pain, shortness breath, cough or hemoptysis. She has repeat CBC and comprehensive metabolic panel performed earlier today and she is here for evaluation and discussion of her lab results.  MEDICAL HISTORY: Past Medical History  Diagnosis Date  . Diabetes mellitus   . Hypercholesteremia   . Colon polyps   . Diverticulitis   . Anxiety   . H/O hiatal hernia   . Hypertension     Dr. Laurance Flatten  . History of shingles 01-07-13    3'14 recent outbreak- drying in   . Lumbar spondylosis     osteoarthritis-knees  . Hearing loss 01-07-13    bilateral hearing aids  . Macular degeneration of both eyes     and dry eyes  . Complication of anesthesia 01-18-2013    slow to awaken and felt crazy for 3-4 days    ALLERGIES:  is allergic to vioxx.  MEDICATIONS:  Current Outpatient Prescriptions  Medication Sig Dispense Refill  . Advocate Lancets MISC 1 Stick by Does not apply route 2 (two) times daily. 100 each 2  . ALPRAZolam (XANAX) 0.25 MG tablet TAKE 1/2 TABLET BY MOUTH TWICE DAILY AS NEEDED OR TAKE 1 TABLET DAILY AS NEEDED 90 tablet 0  . esomeprazole (NEXIUM) 40 MG capsule Take 1 capsule (40 mg total) by mouth daily. 30 capsule 3  .  estradiol (ESTRACE) 1 MG tablet TAKE ONE TABLET BY MOUTH TWICE DAILY 60 tablet 3  . fenofibrate micronized (LOFIBRA) 134 MG capsule TAKE ONE CAPSULE BY MOUTH AT BEDTIME 30 capsule 4  . furosemide (LASIX) 40 MG tablet Take 40 mg by mouth 2 (two) times daily as needed for fluid.     Marland Kitchen glucose blood (FREESTYLE TEST STRIPS) test strip Use as instructed 100 each 12  . glyBURIDE-metformin (GLUCOVANCE) 5-500 MG per tablet TAKE 2 TABLETS BY MOUTH TWICE DAILY 120 tablet 0  . hydroxypropyl methylcellulose (ISOPTO TEARS) 2.5 % ophthalmic solution Place 1 drop into both eyes 2 (two) times daily as needed (dry eyes).    Marland Kitchen lisinopril (PRINIVIL,ZESTRIL) 10 MG tablet TAKE ONE HALF TABLET BY MOUTH EVERY DAY 30 tablet 2  . metoprolol succinate (TOPROL-XL) 25 MG 24 hr tablet TAKE ONE TABLET BY MOUTH ONE TIME DAILY 30 tablet 5  . pravastatin (PRAVACHOL) 80 MG tablet TAKE ONE TABLET BY MOUTH AT BEDTIME 90 tablet 3  . traMADol (ULTRAM) 50 MG tablet Take 1-2 tablets (50-100 mg total) by mouth every 6 (six) hours as needed for moderate pain. 60 tablet 0  . ZETIA 10 MG tablet TAKE ONE TABLET BY MOUTH EVERY DAY AS DIRECTED 30 tablet 4   No current facility-administered medications for this visit.    SURGICAL HISTORY:  Past Surgical History  Procedure Laterality Date  . Cataract  extraction    . Lumbar back surgery  2013  . Ventral hernia repair    . Knee arthroscopy  01-07-13    left knee- many yrs ago  . Total knee arthroplasty Left 01/18/2013    Procedure: TOTAL KNEE ARTHROPLASTY;  Surgeon: Gearlean Alf, MD;  Location: [facility];  Service: Orthopedics;  Laterality: Left;  . Cholecystectomy  1989    open  . Abdominal hysterectomy  1987  . Total knee arthroplasty Right 03/07/2014    Procedure: RIGHT TOTAL KNEE ARTHROPLASTY;  Surgeon: Gearlean Alf, MD;  Location: [facility];  Service: Orthopedics;  Laterality: Right;    REVIEW OF SYSTEMS:  A comprehensive review of systems was negative.   PHYSICAL EXAMINATION:  General appearance: alert, cooperative and no distress Head: Normocephalic, without obvious abnormality, atraumatic Neck: no adenopathy Lymph nodes: Cervical, supraclavicular, and axillary nodes normal. Resp: clear to auscultation bilaterally Cardio: regular rate and rhythm, S1, S2 normal, no murmur, click, rub or gallop GI: soft, non-tender; bowel sounds normal; no masses,  no organomegaly Extremities: extremities normal, atraumatic, no cyanosis or edema  ECOG PERFORMANCE STATUS: 1 - Symptomatic but completely ambulatory  Blood pressure 136/50, pulse 71, temperature 97.9 F (36.6 C), temperature source Oral, resp. rate 18, height 5' 2.5" (1.588 m), weight 173 lb 6.4 oz (78.654 kg), SpO2 99 %.  LABORATORY DATA: Lab Results  Component Value Date   WBC 12.5* 11/16/2014   HGB 13.0 11/16/2014   HCT 39.0 11/16/2014   MCV 91.1 11/16/2014   PLT 179 11/16/2014      Chemistry      Component Value Date/Time   NA 141 11/16/2014 0852   NA 138 10/19/2014 1036   NA 137 03/09/2014 0450   K 4.5 11/16/2014 0852   K 4.7 10/19/2014 1036   CL 99 10/19/2014 1036   CL 105 06/17/2012 0845   CO2 24 11/16/2014 0852   CO2 22 10/19/2014 1036   BUN 16.6 11/16/2014 0852   BUN 21 10/19/2014 1036   BUN 16 03/09/2014 0450   CREATININE 1.1 11/16/2014 0852   CREATININE 1.10* 10/19/2014 1036      Component Value Date/Time   CALCIUM 9.5 11/16/2014 0852   CALCIUM 10.0 10/19/2014 1036   ALKPHOS 36* 11/16/2014 0852   ALKPHOS 33* 10/19/2014 1036   AST 11 11/16/2014 0852   AST 15 10/19/2014 1036   ALT 9 11/16/2014 0852   ALT 7 10/19/2014 1036   BILITOT 0.49 11/16/2014 0852   BILITOT 0.4 10/19/2014 1036       RADIOGRAPHIC STUDIES: No results found.  ASSESSMENT AND PLAN: This is a very pleasant 81 years old white female with stage 0 chronic lymphocytic leukemia currently on observation. The patient is doing fine today with no significant increase in her total white blood count compared to 2 years  ago. I discussed the lab result with the patient and recommend for her to continue on observation for now. I gave her the option of follow-up visit in 6-12 months versus routine visits by Dr. Laurance Flatten and reevaluation again if she has any significant increase in her blood count. The patient lives in [place] and she preferred to continue her routine follow-up visit with Dr. Laurance Flatten for now and she will see me on as-needed basis. The patient was advised to call me immediately if she has any concerning symptoms in the interval.  All questions were answered. The patient knows to call the clinic with any problems, questions or concerns. We can certainly see  the patient much sooner if necessary.  Disclaimer: This note was dictated with voice recognition software. Similar sounding words can inadvertently be transcribed and may be missed upon review.

## 2014-11-16 NOTE — Telephone Encounter (Signed)
Last seen 10/20/14 [facility]  If approved route to nurse to call into [facility]

## 2014-11-28 ENCOUNTER — Ambulatory Visit (INDEPENDENT_AMBULATORY_CARE_PROVIDER_SITE_OTHER): Payer: Medicare Other

## 2014-11-28 ENCOUNTER — Other Ambulatory Visit: Payer: Self-pay | Admitting: Family Medicine

## 2014-11-28 ENCOUNTER — Ambulatory Visit (INDEPENDENT_AMBULATORY_CARE_PROVIDER_SITE_OTHER): Payer: Medicare Other | Admitting: Family Medicine

## 2014-11-28 ENCOUNTER — Other Ambulatory Visit: Payer: Self-pay | Admitting: *Deleted

## 2014-11-28 ENCOUNTER — Other Ambulatory Visit: Payer: Self-pay | Admitting: Nurse Practitioner

## 2014-11-28 VITALS — BP 132/69 | HR 71 | Temp 98.0°F | Ht 62.5 in | Wt 173.0 lb

## 2014-11-28 DIAGNOSIS — S32501A Unspecified fracture of right pubis, initial encounter for closed fracture: Secondary | ICD-10-CM | POA: Diagnosis not present

## 2014-11-28 DIAGNOSIS — W19XXXA Unspecified fall, initial encounter: Secondary | ICD-10-CM

## 2014-11-28 DIAGNOSIS — M25551 Pain in right hip: Secondary | ICD-10-CM | POA: Diagnosis not present

## 2014-11-28 DIAGNOSIS — S50311A Abrasion of right elbow, initial encounter: Secondary | ICD-10-CM | POA: Diagnosis not present

## 2014-11-28 DIAGNOSIS — S61219A Laceration without foreign body of unspecified finger without damage to nail, initial encounter: Secondary | ICD-10-CM

## 2014-11-28 DIAGNOSIS — S32591A Other specified fracture of right pubis, initial encounter for closed fracture: Secondary | ICD-10-CM

## 2014-11-28 MED ORDER — GLUCOSE BLOOD VI STRP: ORAL_STRIP | Status: DC

## 2014-11-28 MED ORDER — ADVOCATE LANCETS MISC: Status: DC

## 2014-11-28 NOTE — Telephone Encounter (Signed)
done

## 2014-11-28 NOTE — Patient Instructions (Signed)
Please keep the finger skin tear clean and dry Please usual walker at home Take pain medicine that you have at home if needed for severe: Pain and hip pain Please be careful and did not put yourself at risk for any further falls Clean and redress right elbow abrasion

## 2014-11-28 NOTE — Progress Notes (Signed)
Subjective:    Patient ID: Kristy [name], female    DOB: October 22, 1927, 81 y.o.   MRN: [id number]  HPI Patient here today for a fall. She states she fell down carpeted stairs at [employer]. She complains of right side groin and hip pain and several other skin abrasions. She is accompanied today by her husband. She has a torn skin tear of the left pinky. She has an abrasion of the right elbow and she is having right side hip and groin pain.          Patient Active Problem List   Diagnosis Date Noted  . Depression 08/24/2013  . Essential hypertension, benign 06/08/2013  . Hyperlipidemia 06/08/2013  . Vitamin D deficiency 06/08/2013  . Diabetes mellitus type 2 controlled 06/08/2013  . OA (osteoarthritis) of knee 01/18/2013  . CLL (chronic lymphocytic leukemia) 06/17/2012   Outpatient Encounter Prescriptions as of 11/28/2014  Medication Sig  . ADVOCATE LANCETS MISC Check Blood Sugar bid. Dx E11.9  . ALPRAZolam (XANAX) 0.25 MG tablet TAKE 1/2 TABLET TWICE DAILY AS NEEDED, OR TAKE 1 TABLET DAILY AS NEEDED  . esomeprazole (NEXIUM) 40 MG capsule Take 1 capsule (40 mg total) by mouth daily.  Marland Kitchen estradiol (ESTRACE) 1 MG tablet TAKE ONE TABLET BY MOUTH TWICE DAILY  . fenofibrate micronized (LOFIBRA) 134 MG capsule TAKE ONE CAPSULE BY MOUTH AT BEDTIME  . furosemide (LASIX) 40 MG tablet Take 40 mg by mouth 2 (two) times daily as needed for fluid.   Marland Kitchen glucose blood (FREESTYLE TEST STRIPS) test strip Check Blood sugar bid. Dx E11.9  . glyBURIDE-metformin (GLUCOVANCE) 5-500 MG per tablet TAKE 2 TABLETS BY MOUTH TWICE DAILY  . hydroxypropyl methylcellulose (ISOPTO TEARS) 2.5 % ophthalmic solution Place 1 drop into both eyes 2 (two) times daily as needed (dry eyes).  Marland Kitchen lisinopril (PRINIVIL,ZESTRIL) 10 MG tablet TAKE ONE HALF TABLET BY MOUTH EVERY DAY  . metoprolol succinate (TOPROL-XL) 25 MG 24 hr tablet TAKE ONE TABLET BY MOUTH ONE TIME DAILY  . pravastatin (PRAVACHOL) 80 MG tablet TAKE ONE  TABLET BY MOUTH AT BEDTIME  . traMADol (ULTRAM) 50 MG tablet Take 1-2 tablets (50-100 mg total) by mouth every 6 (six) hours as needed for moderate pain.  Marland Kitchen ZETIA 10 MG tablet TAKE ONE TABLET BY MOUTH EVERY DAY AS DIRECTED  . [DISCONTINUED] glucose blood (FREESTYLE TEST STRIPS) test strip Check Blood sugar bid. Dx E11.9    Review of Systems  Constitutional: Negative.   HENT: Negative.   Eyes: Negative.   Respiratory: Negative.   Cardiovascular: Negative.   Gastrointestinal: Negative.   Endocrine: Negative.   Genitourinary: Negative.   Musculoskeletal: Positive for arthralgias. Back pain: pain in pelvis and right hip   Skin: Positive for wound (several skin abrasions).  Allergic/Immunologic: Negative.   Neurological: Negative.   Hematological: Negative.   Psychiatric/Behavioral: Negative.        Objective:   Physical Exam  Constitutional: She is oriented to person, place, and time. She appears well-developed and well-nourished. She appears distressed.  HENT:  Head: Normocephalic and atraumatic.  Eyes: Conjunctivae and EOM are normal. Pupils are equal, round, and reactive to light. Right eye exhibits no discharge. Left eye exhibits no discharge. No scleral icterus.  Neck: Normal range of motion.  Abdominal: Soft. Bowel sounds are normal. She exhibits no mass. There is tenderness. There is no rebound and no guarding.  Tenderness right groin 1  Musculoskeletal: Normal range of motion. She exhibits tenderness. She exhibits no edema.  Good mobility of each knee Good mobility of right elbow Pain and groin with flexion of hip right side  Neurological: She is alert and oriented to person, place, and time.  Skin: Skin is warm and dry.  Multiple skin tears and abrasions including the fifth finger of the left hand and an abrasion of the right elbow and also an abrasion of the right hand. All of these wounds were cleansed and dressed.  Psychiatric: She has a normal mood and affect. Her  behavior is normal. Judgment and thought content normal.  Vitals reviewed.  BP 132/69 mmHg  Pulse 71  Temp(Src) 98 F (36.7 C) (Oral)  Ht 5' 2.5" (1.588 m)  Wt 173 lb (78.472 kg)  BMI 31.12 kg/m2  [facility] reading (PRIMARY) by  Dr. Laurance Flatten--- fracture of posterior pubic ramus right side nondisplaced                                        Assessment & Plan:  1. Fall, initial encounter - DG HIP UNILAT WITH PELVIS 2-3 VIEWS RIGHT; Future  2. Pubic ramus fracture, right, closed, initial encounter -Take pain medicine at home as needed -Use walker  3. Finger laceration, initial encounter -Leave dressing in place and do not change until we see patient in a week -Keep this clean and dry  4. Abrasion of right elbow, initial encounter -Change dressing daily  Patient Instructions  Please keep the finger skin tear clean and dry Please usual walker at home Take pain medicine that you have at home if needed for severe: Pain and hip pain Please be careful and did not put yourself at risk for any further falls Clean and redress right elbow abrasion   Arrie Senate MD

## 2014-12-05 ENCOUNTER — Ambulatory Visit (INDEPENDENT_AMBULATORY_CARE_PROVIDER_SITE_OTHER): Payer: Medicare Other | Admitting: Family Medicine

## 2014-12-05 ENCOUNTER — Encounter: Payer: Self-pay | Admitting: Family Medicine

## 2014-12-05 VITALS — BP 109/60 | HR 77 | Temp 97.7°F | Ht 62.5 in | Wt 173.0 lb

## 2014-12-05 DIAGNOSIS — S61219D Laceration without foreign body of unspecified finger without damage to nail, subsequent encounter: Secondary | ICD-10-CM

## 2014-12-05 DIAGNOSIS — S32501D Unspecified fracture of right pubis, subsequent encounter for fracture with routine healing: Secondary | ICD-10-CM

## 2014-12-05 DIAGNOSIS — S50311D Abrasion of right elbow, subsequent encounter: Secondary | ICD-10-CM

## 2014-12-05 DIAGNOSIS — S7001XD Contusion of right hip, subsequent encounter: Secondary | ICD-10-CM | POA: Diagnosis not present

## 2014-12-05 DIAGNOSIS — S32591D Other specified fracture of right pubis, subsequent encounter for fracture with routine healing: Secondary | ICD-10-CM

## 2014-12-05 NOTE — Progress Notes (Signed)
Subjective:    Patient ID: Kristy [name], female    DOB: October 29, 1927, 81 y.o.   MRN: [id number]  HPI Patient here today for 1 week follow up on fall and skin scrapes. She is accompanied today by her husband. The patient looks good and is in good spirits. She indicates she she has groin pain when she walks or moves. When she is sitting still she does not have any pain. She did not bring her walker to the visit today and use her husband for assistance and she was scolded for this. She will do better in the future she says. The abrasions appear to be healing and the laceration on her pinky finger on the left hand appears to be healing also.          Patient Active Problem List   Diagnosis Date Noted  . Depression 08/24/2013  . Essential hypertension, benign 06/08/2013  . Hyperlipidemia 06/08/2013  . Vitamin D deficiency 06/08/2013  . Diabetes mellitus type 2 controlled 06/08/2013  . OA (osteoarthritis) of knee 01/18/2013  . CLL (chronic lymphocytic leukemia) 06/17/2012   Outpatient Encounter Prescriptions as of 12/05/2014  Medication Sig  . ADVOCATE LANCETS MISC Check Blood Sugar bid. Dx E11.9  . ALPRAZolam (XANAX) 0.25 MG tablet TAKE 1/2 TABLET TWICE DAILY AS NEEDED, OR TAKE 1 TABLET DAILY AS NEEDED  . esomeprazole (NEXIUM) 40 MG capsule Take 1 capsule (40 mg total) by mouth daily.  Marland Kitchen estradiol (ESTRACE) 1 MG tablet TAKE ONE TABLET BY MOUTH TWICE DAILY  . fenofibrate micronized (LOFIBRA) 134 MG capsule TAKE ONE CAPSULE BY MOUTH AT BEDTIME  . furosemide (LASIX) 40 MG tablet Take 40 mg by mouth 2 (two) times daily as needed for fluid.   Marland Kitchen glucose blood (FREESTYLE TEST STRIPS) test strip Check Blood sugar bid. Dx E11.9  . glyBURIDE-metformin (GLUCOVANCE) 5-500 MG per tablet TAKE 2 TABLETS BY MOUTH TWICE DAILY  . hydroxypropyl methylcellulose (ISOPTO TEARS) 2.5 % ophthalmic solution Place 1 drop into both eyes 2 (two) times daily as needed (dry eyes).  Marland Kitchen lisinopril (PRINIVIL,ZESTRIL)  10 MG tablet TAKE ONE HALF TABLET BY MOUTH EVERY DAY  . metoprolol succinate (TOPROL-XL) 25 MG 24 hr tablet TAKE ONE TABLET BY MOUTH ONE TIME DAILY  . pravastatin (PRAVACHOL) 80 MG tablet TAKE ONE TABLET BY MOUTH AT BEDTIME  . traMADol (ULTRAM) 50 MG tablet Take 1-2 tablets (50-100 mg total) by mouth every 6 (six) hours as needed for moderate pain.  Marland Kitchen ZETIA 10 MG tablet TAKE ONE TABLET BY MOUTH EVERY DAY AS DIRECTED    Review of Systems  Constitutional: Negative.   HENT: Negative.   Eyes: Negative.   Respiratory: Negative.   Cardiovascular: Negative.   Gastrointestinal: Negative.   Endocrine: Negative.   Genitourinary: Negative.   Musculoskeletal: Negative.   Skin: Negative.        Multiple skin scrapes.  Allergic/Immunologic: Negative.   Neurological: Negative.   Hematological: Negative.   Psychiatric/Behavioral: Negative.        Objective:   Physical Exam  Constitutional: She is oriented to person, place, and time. She appears well-developed and well-nourished. No distress.  HENT:  Head: Normocephalic.  Musculoskeletal: She exhibits tenderness.  The patient has a large hematoma that seems to be scattering on her right hip area. The films of her hips were negative other than the fracture in the posterior pubic ramus. She was able to stand and turn without any problems but once again she was encouraged to use the  walker in the future  Neurological: She is alert and oriented to person, place, and time.  Skin: Skin is warm and dry. No rash noted. No erythema. No pallor.  Right elbow abrasion was cleansed and dressed. Fifth finger laceration was cleaned and dressed, large bruise on right hip and right lateral thigh  Psychiatric: She has a normal mood and affect. Her behavior is normal. Judgment and thought content normal.  Nursing note and vitals reviewed.  BP 109/60 mmHg  Pulse 77  Temp(Src) 97.7 F (36.5 C) (Oral)  Ht 5' 2.5" (1.588 m)  Wt 173 lb (78.472 kg)  BMI 31.12  kg/m2  All wounds were cleansed and dressed      Assessment & Plan:  1. Finger laceration, subsequent encounter -Keep clean and dry for 3-4 more days then let Steri-Strips come off on their own  2. Pubic ramus fracture, right, with routine healing, subsequent encounter -Continue using walker and return to clinic in 3-4 weeks and re-x-ray  3. Elbow abrasion, right, subsequent encounter -Dressed daily leave open to air when at home  4. Contusion of right hip, subsequent encounter -The bruise appears to be spreading and patient was reassured regarding this  There are no Patient Instructions on file for this visit.    Arrie Senate MD

## 2014-12-09 ENCOUNTER — Telehealth: Payer: Self-pay | Admitting: Family Medicine

## 2014-12-20 ENCOUNTER — Telehealth: Payer: Self-pay

## 2014-12-20 NOTE — Telephone Encounter (Signed)
Estradiol approved  Reference # [id number]

## 2014-12-22 NOTE — Telephone Encounter (Signed)
Form signed and faxed

## 2014-12-28 ENCOUNTER — Telehealth: Payer: Self-pay | Admitting: *Deleted

## 2014-12-28 NOTE — Telephone Encounter (Signed)
Dr. Aline Brochure insurance is not denying

## 2014-12-28 NOTE — Telephone Encounter (Signed)
Dr  Laurance Flatten, [employer]'s ins is not denying her estradiol but it is a high tier.  I applied for a tier exception and it was denied.  They wanted to know if she had tried and failed alendronate, fluxetine, paroxetine,and venlafaxine. I talked with Tammy regarding an argument for tier exception.  She said pt needs to talk with you because ins is concerned with her taking this medication at her age because it is high risk.She said you need to discuss with [place] the need to continue this medication or stop it.

## 2014-12-28 NOTE — Telephone Encounter (Signed)
Let us plan to have a conversation with Kristy [name] next week about discontinuing the estradiol. She has been on this for years. We will discuss with the pharmacist about tapering her off of this medication over 2-3 months.

## 2015-01-03 ENCOUNTER — Encounter: Payer: Self-pay | Admitting: Family Medicine

## 2015-01-03 ENCOUNTER — Ambulatory Visit (INDEPENDENT_AMBULATORY_CARE_PROVIDER_SITE_OTHER): Payer: Medicare Other | Admitting: Family Medicine

## 2015-01-03 ENCOUNTER — Ambulatory Visit (INDEPENDENT_AMBULATORY_CARE_PROVIDER_SITE_OTHER): Payer: Medicare Other

## 2015-01-03 VITALS — BP 119/65 | HR 80 | Temp 96.6°F | Ht 62.5 in | Wt 173.0 lb

## 2015-01-03 DIAGNOSIS — S329XXD Fracture of unspecified parts of lumbosacral spine and pelvis, subsequent encounter for fracture with routine healing: Secondary | ICD-10-CM

## 2015-01-03 DIAGNOSIS — Z961 Presence of intraocular lens: Secondary | ICD-10-CM | POA: Diagnosis not present

## 2015-01-03 DIAGNOSIS — H04123 Dry eye syndrome of bilateral lacrimal glands: Secondary | ICD-10-CM | POA: Diagnosis not present

## 2015-01-03 NOTE — Patient Instructions (Signed)
The patient should continue to do exercises at home 3-4 times daily and most importantly she should take caution regarding climbing going up and down steps etc. She will reduce her estradiol to 1 pill daily and will be given an appointment to see the clinical pharmacists in 4 weeks to come off of this medicine altogether.

## 2015-01-03 NOTE — Telephone Encounter (Signed)
We will discuss this at appt today

## 2015-01-03 NOTE — Progress Notes (Signed)
Subjective:    Patient ID: Kristy [name], female    DOB: Mar 13, 1928, 81 y.o.   MRN: [id number]  HPI Patient here today for follow up from fall and pelvic fracture. She is accompanied today by her husband. Approximately 6 weeks ago the patient fell down some steps coming out of church. She sustained a nondisplaced right inferior ramus fracture of the pelvis. She is doing much better and having much less pain. She was able to get out of the chair and walk to me turn around without any pain. Her husband who came with her today says that she still complains of pain at home but once again is recovering well. Another issue that we need to deal with is her estradiol intake. We plan to reduce this and discontinue this over the next month. She is currently taking 2 pills daily and she has been told to reduce this to 1 pill daily and will be given an appointment to see the clinical pharmacists in about 4 weeks at which time we will discontinue the estradiol and switch over to another medication.         Patient Active Problem List   Diagnosis Date Noted  . Depression 08/24/2013  . Essential hypertension, benign 06/08/2013  . Hyperlipidemia 06/08/2013  . Vitamin D deficiency 06/08/2013  . Diabetes mellitus type 2 controlled 06/08/2013  . OA (osteoarthritis) of knee 01/18/2013  . CLL (chronic lymphocytic leukemia) 06/17/2012   Outpatient Encounter Prescriptions as of 01/03/2015  Medication Sig  . ADVOCATE LANCETS MISC Check Blood Sugar bid. Dx E11.9  . ALPRAZolam (XANAX) 0.25 MG tablet TAKE 1/2 TABLET TWICE DAILY AS NEEDED, OR TAKE 1 TABLET DAILY AS NEEDED  . esomeprazole (NEXIUM) 40 MG capsule Take 1 capsule (40 mg total) by mouth daily.  Marland Kitchen estradiol (ESTRACE) 1 MG tablet TAKE ONE TABLET BY MOUTH TWICE DAILY  . fenofibrate micronized (LOFIBRA) 134 MG capsule TAKE ONE CAPSULE BY MOUTH AT BEDTIME  . furosemide (LASIX) 40 MG tablet Take 40 mg by mouth 2 (two) times daily as needed for fluid.   Marland Kitchen  glucose blood (FREESTYLE TEST STRIPS) test strip Check Blood sugar bid. Dx E11.9  . glyBURIDE-metformin (GLUCOVANCE) 5-500 MG per tablet TAKE 2 TABLETS BY MOUTH TWICE DAILY  . hydroxypropyl methylcellulose (ISOPTO TEARS) 2.5 % ophthalmic solution Place 1 drop into both eyes 2 (two) times daily as needed (dry eyes).  Marland Kitchen lisinopril (PRINIVIL,ZESTRIL) 10 MG tablet TAKE ONE HALF TABLET BY MOUTH EVERY DAY  . metoprolol succinate (TOPROL-XL) 25 MG 24 hr tablet TAKE ONE TABLET BY MOUTH ONE TIME DAILY  . pravastatin (PRAVACHOL) 80 MG tablet TAKE ONE TABLET BY MOUTH AT BEDTIME  . traMADol (ULTRAM) 50 MG tablet Take 1-2 tablets (50-100 mg total) by mouth every 6 (six) hours as needed for moderate pain.  Marland Kitchen ZETIA 10 MG tablet TAKE ONE TABLET BY MOUTH EVERY DAY AS DIRECTED     Review of Systems  Constitutional: Negative.   HENT: Negative.   Eyes: Negative.   Respiratory: Negative.   Cardiovascular: Negative.   Gastrointestinal: Negative.   Endocrine: Negative.   Genitourinary: Negative.   Musculoskeletal: Negative.   Skin: Negative.   Allergic/Immunologic: Negative.   Neurological: Negative.   Hematological: Negative.   Psychiatric/Behavioral: Negative.        Objective:   Physical Exam  Constitutional: She is oriented to person, place, and time. She appears well-developed and well-nourished. No distress.  HENT:  Head: Normocephalic.  Eyes: Conjunctivae and EOM are  normal. Pupils are equal, round, and reactive to light. Right eye exhibits no discharge.  Neck: Normal range of motion.  Musculoskeletal: Normal range of motion. She exhibits no edema or tenderness.  The patient has good range of motion of the hip with abduction and abduction movements. There is good flexion and extension of the hip. She did not show any signs of any pain. There was no tenderness to palpation. She is able to stand up on her own and walk and turn and go back and sit down without any slowing of movement.    Neurological: She is alert and oriented to person, place, and time.  Skin: Skin is warm and dry. No rash noted.  Psychiatric: She has a normal mood and affect. Her behavior is normal. Thought content normal.  Nursing note and vitals reviewed.  BP 119/65 mmHg  Pulse 80  Temp(Src) 96.6 F (35.9 C) (Oral)  Ht 5' 2.5" (1.588 m)  Wt 173 lb (78.472 kg)  BMI 31.12 kg/m2  [facility] reading (PRIMARY) by  Dr. Cecil Cranker inferior ramus fracture on the right side                                       Assessment & Plan:  1. Pelvic fracture, with routine healing, subsequent encounter -Continue gradual increase in physical activity and range of motion exercises with right hip - DG Pelvis 1-2 Views; Future  On a separate issue the patient was advised to reduce her estradiol to 1 pill daily and given a return appointment to see the clinical pharmacists in 4 weeks to discontinue this once and for all.  Patient Instructions  The patient should continue to do exercises at home 3-4 times daily and most importantly she should take caution regarding climbing going up and down steps etc. She will reduce her estradiol to 1 pill daily and will be given an appointment to see the clinical pharmacists in 4 weeks to come off of this medicine altogether.   Arrie Senate MD

## 2015-01-10 ENCOUNTER — Other Ambulatory Visit: Payer: Self-pay | Admitting: Family Medicine

## 2015-01-24 ENCOUNTER — Encounter: Payer: Self-pay | Admitting: Pharmacist Clinician (PhC)/ Clinical Pharmacy Specialist

## 2015-01-24 ENCOUNTER — Ambulatory Visit (INDEPENDENT_AMBULATORY_CARE_PROVIDER_SITE_OTHER): Payer: Medicare Other | Admitting: Pharmacist Clinician (PhC)/ Clinical Pharmacy Specialist

## 2015-01-24 DIAGNOSIS — Z79890 Hormone replacement therapy (postmenopausal): Secondary | ICD-10-CM

## 2015-01-24 MED ORDER — ESTRADIOL 1 MG PO TABS: 1.0000 mg | ORAL_TABLET | Freq: Every day | ORAL | Status: DC

## 2015-01-24 NOTE — Progress Notes (Signed)
Kristy [name] is an 81 year old female who has been on HRT for the past 30 years plus, following a total hysterectomy.  Patient has very minimal and sporadic vasomotor symptoms.  She has not seen any increase since Dr. Laurance Flatten decreased her estradiol from 1mg  bid to qd.  I will have her continue 1mg  qd until May 2nd and then decrease to taking 1/2 tablet a day until May 16th at which time she will discontinue taking estradiol.  4 weeks later patient was instructed to have a lipid panel checked since she may be able to stop fenofibrate (oral estrogen can cause significant TG elevations).  I discussed the use of low dose gabapentin with patient if night sweats or hot flashes resume.  I also discussed the Josph Macho Procedure with patient for urinary incontinence (it is mild now but may worsen once she stops estradiol).  Patient will call me with any change in symptoms.  We discussed in detail clinical trial data associated with HRT use and the risks involved with long term use and at her age.  Patient is agreeable to everything.

## 2015-02-07 ENCOUNTER — Other Ambulatory Visit: Payer: Self-pay | Admitting: Family Medicine

## 2015-02-07 ENCOUNTER — Ambulatory Visit (INDEPENDENT_AMBULATORY_CARE_PROVIDER_SITE_OTHER): Payer: Medicare Other

## 2015-02-07 ENCOUNTER — Other Ambulatory Visit: Payer: Medicare Other

## 2015-02-07 DIAGNOSIS — Z780 Asymptomatic menopausal state: Secondary | ICD-10-CM

## 2015-02-07 DIAGNOSIS — T148XXA Other injury of unspecified body region, initial encounter: Secondary | ICD-10-CM

## 2015-02-07 DIAGNOSIS — T148 Other injury of unspecified body region: Secondary | ICD-10-CM | POA: Diagnosis not present

## 2015-02-08 ENCOUNTER — Ambulatory Visit (INDEPENDENT_AMBULATORY_CARE_PROVIDER_SITE_OTHER): Payer: Medicare Other | Admitting: Cardiology

## 2015-02-08 ENCOUNTER — Encounter: Payer: Self-pay | Admitting: Cardiology

## 2015-02-08 VITALS — BP 110/60 | HR 74 | Ht 63.0 in | Wt 180.0 lb

## 2015-02-08 DIAGNOSIS — I10 Essential (primary) hypertension: Secondary | ICD-10-CM

## 2015-02-08 DIAGNOSIS — I471 Supraventricular tachycardia, unspecified: Secondary | ICD-10-CM

## 2015-02-08 NOTE — Progress Notes (Signed)
HPI The patient presents for follow-up of SVT. I saw her last year prior to right knee replacement. She did well with this. Since then she is recovered nicely. She gets around slowly but it did little gardening today. With this she denies any cardiovascular symptoms or she thinks she may take 1 dose of an extra metoprolol 1 since I saw her. She otherwise does fine. She's not having any sustained palpitations. She doesn't have any presyncope or syncope. She has no chest or arm discomfort. She's had no new shortness of breath, PND or orthopnea. She's had no weight gain or edema.  Allergies  Allergen Reactions  . Vioxx [Rofecoxib] Other (See Comments)    Ulcers in mouth     Current Outpatient Prescriptions  Medication Sig Dispense Refill  . ALPRAZolam (XANAX) 0.25 MG tablet TAKE 1/2 TABLET TWICE DAILY AS NEEDED, OR TAKE 1 TABLET DAILY AS NEEDED 90 tablet 1  . esomeprazole (NEXIUM) 40 MG capsule Take 1 capsule (40 mg total) by mouth daily. (Patient taking differently: Take 40 mg by mouth as needed (reflux). ) 30 capsule 3  . estradiol (ESTRACE) 1 MG tablet Take 1 tablet (1 mg total) by mouth daily. 60 tablet 3  . fenofibrate micronized (LOFIBRA) 134 MG capsule TAKE ONE CAPSULE BY MOUTH AT BEDTIME 30 capsule 3  . furosemide (LASIX) 40 MG tablet Take 40 mg by mouth 2 (two) times daily as needed for fluid.     Marland Kitchen glyBURIDE-metformin (GLUCOVANCE) 5-500 MG per tablet TAKE 2 TABLETS BY MOUTH TWICE DAILY 120 tablet 4  . hydroxypropyl methylcellulose (ISOPTO TEARS) 2.5 % ophthalmic solution Place 1 drop into both eyes 2 (two) times daily as needed (dry eyes).    Marland Kitchen lisinopril (PRINIVIL,ZESTRIL) 10 MG tablet TAKE ONE HALF TABLET BY MOUTH EVERY DAY 30 tablet 2  . metoprolol succinate (TOPROL-XL) 25 MG 24 hr tablet TAKE ONE TABLET BY MOUTH ONE TIME DAILY 30 tablet 5  . pravastatin (PRAVACHOL) 80 MG tablet TAKE ONE TABLET BY MOUTH AT BEDTIME 90 tablet 3  . traMADol (ULTRAM) 50 MG tablet Take 1-2 tablets  (50-100 mg total) by mouth every 6 (six) hours as needed for moderate pain. 60 tablet 0  . ZETIA 10 MG tablet TAKE ONE TABLET BY MOUTH EVERY DAY AS DIRECTED 30 tablet 4  . ADVOCATE LANCETS MISC Check Blood Sugar bid. Dx E11.9 100 each 2  . glucose blood (FREESTYLE TEST STRIPS) test strip Check Blood sugar bid. Dx E11.9 (Patient not taking: Reported on 02/08/2015) 300 each 2   No current facility-administered medications for this visit.    Past Medical History  Diagnosis Date  . Diabetes mellitus   . Hypercholesteremia   . Colon polyps   . Diverticulitis   . Anxiety   . H/O hiatal hernia   . Hypertension     Dr. Laurance Flatten  . History of shingles 01-07-13    3'14 recent outbreak- drying in   . Lumbar spondylosis     osteoarthritis-knees  . Hearing loss 01-07-13    bilateral hearing aids  . Macular degeneration of both eyes     and dry eyes  . Complication of anesthesia 01-18-2013    slow to awaken and felt crazy for 3-4 days    Past Surgical History  Procedure Laterality Date  . Cataract extraction    . Lumbar back surgery  2013  . Ventral hernia repair    . Knee arthroscopy  01-07-13    left knee- many yrs ago  .  Total knee arthroplasty Left 01/18/2013    Procedure: TOTAL KNEE ARTHROPLASTY;  Surgeon: Gearlean Alf, MD;  Location: [facility];  Service: Orthopedics;  Laterality: Left;  . Cholecystectomy  1989    open  . Abdominal hysterectomy  1987  . Total knee arthroplasty Right 03/07/2014    Procedure: RIGHT TOTAL KNEE ARTHROPLASTY;  Surgeon: Gearlean Alf, MD;  Location: [facility];  Service: Orthopedics;  Laterality: Right;    ROS:  Positive difficulty hearing.    Otherwise as stated in the HPI and negative for all other systems.  PHYSICAL EXAM BP 110/60 mmHg  Pulse 74  Ht 5\' 3"  (1.6 m)  Wt 180 lb (81.647 kg)  BMI 31.89 kg/m2 GENERAL:  Well appearing NECK:  No jugular venous distention, waveform within normal limits, carotid upstroke brisk and symmetric, no bruits, no  thyromegaly LUNGS:  Clear to auscultation bilaterally BACK:  No CVA tenderness CHEST:  Unremarkable HEART:  PMI not displaced or sustained,S1 and S2 within normal limits, no S3, no S4, no clicks, no rubs, no murmurs ABD:  Flat, positive bowel sounds normal in frequency in pitch, no bruits, no rebound, no guarding, no midline pulsatile mass, no hepatomegaly, no splenomegaly EXT:  2 plus pulses throughout, no edema, no cyanosis no clubbing   EKG:  Sinus rhythm, rate 74, leftward axis, no acute ST-T wave. 02/08/2015  ASSESSMENT AND PLAN  Hypertension - The blood pressure is at target. No change in medications is indicated. We will continue with therapeutic lifestyle changes (TLC).  Hyperlipidemia - She has an excellent lipid profile. No change in therapy is suggested. This is followed by Dr. Laurance Flatten.  SVT - The patient has had no symptomatic tachypalpitations. She will continue on the beta blocker with PRN.     She can come back to see me as needed.

## 2015-02-08 NOTE — Patient Instructions (Signed)
The current medical regimen is effective;  continue present plan and medications.  Follow up as needed with Dr Percival Spanish.  Thank you for choosing [facility]!!

## 2015-03-02 ENCOUNTER — Other Ambulatory Visit (INDEPENDENT_AMBULATORY_CARE_PROVIDER_SITE_OTHER): Payer: Medicare Other

## 2015-03-02 ENCOUNTER — Ambulatory Visit: Payer: Medicare Other | Admitting: Family Medicine

## 2015-03-02 DIAGNOSIS — C9110 Chronic lymphocytic leukemia of B-cell type not having achieved remission: Secondary | ICD-10-CM

## 2015-03-02 DIAGNOSIS — E119 Type 2 diabetes mellitus without complications: Secondary | ICD-10-CM | POA: Diagnosis not present

## 2015-03-02 DIAGNOSIS — E559 Vitamin D deficiency, unspecified: Secondary | ICD-10-CM

## 2015-03-02 DIAGNOSIS — I10 Essential (primary) hypertension: Secondary | ICD-10-CM

## 2015-03-02 DIAGNOSIS — R5383 Other fatigue: Secondary | ICD-10-CM | POA: Diagnosis not present

## 2015-03-02 DIAGNOSIS — E785 Hyperlipidemia, unspecified: Secondary | ICD-10-CM | POA: Diagnosis not present

## 2015-03-02 LAB — POCT CBC
Granulocyte percent: 42 %G (ref 37–80)
HEMATOCRIT: 37.5 % — AB (ref 37.7–47.9)
Hemoglobin: 12.3 g/dL (ref 12.2–16.2)
Lymph, poc: 6.2 — AB (ref 0.6–3.4)
MCH, POC: 29.1 pg (ref 27–31.2)
MCHC: 32.7 g/dL (ref 31.8–35.4)
MCV: 88.9 fL (ref 80–97)
MPV: 9.9 fL (ref 0–99.8)
POC Granulocyte: 4.9 (ref 2–6.9)
POC LYMPH PERCENT: 52.8 %L — AB (ref 10–50)
Platelet Count, POC: 147 10*3/uL (ref 142–424)
RBC: 4.22 M/uL (ref 4.04–5.48)
RDW, POC: 13.1 %
WBC: 11.7 10*3/uL — AB (ref 4.6–10.2)

## 2015-03-02 LAB — POCT GLYCOSYLATED HEMOGLOBIN (HGB A1C): HEMOGLOBIN A1C: 7.8

## 2015-03-02 NOTE — Progress Notes (Signed)
Lab only 

## 2015-03-03 LAB — HEPATIC FUNCTION PANEL
ALBUMIN: 4 g/dL (ref 3.5–4.7)
ALK PHOS: 39 IU/L (ref 39–117)
ALT: 11 IU/L (ref 0–32)
AST: 12 IU/L (ref 0–40)
Bilirubin Total: 0.3 mg/dL (ref 0.0–1.2)
Bilirubin, Direct: 0.11 mg/dL (ref 0.00–0.40)
Total Protein: 6 g/dL (ref 6.0–8.5)

## 2015-03-03 LAB — NMR, LIPOPROFILE
Cholesterol: 132 mg/dL (ref 100–199)
HDL CHOLESTEROL BY NMR: 55 mg/dL (ref >39)
HDL Particle Number: 46.5 umol/L (ref >=30.5)
LDL Particle Number: 940 nmol/L (ref <1000)
LDL Size: 20.3 nm (ref >20.5)
LDL-C: 55 mg/dL (ref 0–99)
LP-IR SCORE: 47 — AB (ref <=45)
Small LDL Particle Number: 588 nmol/L — ABNORMAL HIGH (ref <=527)
Triglycerides by NMR: 111 mg/dL (ref 0–149)

## 2015-03-03 LAB — BMP8+EGFR
BUN/Creatinine Ratio: 18 (ref 11–26)
BUN: 18 mg/dL (ref 8–27)
CALCIUM: 9.6 mg/dL (ref 8.7–10.3)
CHLORIDE: 104 mmol/L (ref 97–108)
CO2: 23 mmol/L (ref 18–29)
CREATININE: 0.98 mg/dL (ref 0.57–1.00)
GFR, EST AFRICAN AMERICAN: 61 mL/min/{1.73_m2} (ref >59)
GFR, EST NON AFRICAN AMERICAN: 53 mL/min/{1.73_m2} — AB (ref >59)
Glucose: 219 mg/dL — ABNORMAL HIGH (ref 65–99)
POTASSIUM: 5 mmol/L (ref 3.5–5.2)
SODIUM: 143 mmol/L (ref 134–144)

## 2015-03-03 LAB — VITAMIN D 25 HYDROXY (VIT D DEFICIENCY, FRACTURES): Vit D, 25-Hydroxy: 28 ng/mL — ABNORMAL LOW (ref 30.0–100.0)

## 2015-03-07 ENCOUNTER — Encounter: Payer: Self-pay | Admitting: Family Medicine

## 2015-03-07 ENCOUNTER — Ambulatory Visit (INDEPENDENT_AMBULATORY_CARE_PROVIDER_SITE_OTHER): Payer: Medicare Other | Admitting: Family Medicine

## 2015-03-07 VITALS — BP 108/67 | HR 70 | Temp 98.5°F | Ht 63.0 in | Wt 174.0 lb

## 2015-03-07 DIAGNOSIS — I10 Essential (primary) hypertension: Secondary | ICD-10-CM

## 2015-03-07 DIAGNOSIS — C9110 Chronic lymphocytic leukemia of B-cell type not having achieved remission: Secondary | ICD-10-CM

## 2015-03-07 DIAGNOSIS — R5383 Other fatigue: Secondary | ICD-10-CM

## 2015-03-07 DIAGNOSIS — E119 Type 2 diabetes mellitus without complications: Secondary | ICD-10-CM

## 2015-03-07 NOTE — Progress Notes (Signed)
Subjective:    Patient ID: Kristy [name], female    DOB: 1928/04/24, 81 y.o.   MRN: [id number]  HPI Pt here for follow up and management of chronic medical problems which includes hypertension, hyperlipidemia, and diabetes. She is taking medications regularly. The patient is doing well however she does describe some dark stools. The lab work was reviewed with heard at the beginning of the visit today and she will be given an FOBT to return because of the dark stools.      Patient Active Problem List   Diagnosis Date Noted  . SVT (supraventricular tachycardia) 02/08/2015  . Depression 08/24/2013  . Essential hypertension, benign 06/08/2013  . Hyperlipidemia 06/08/2013  . Vitamin D deficiency 06/08/2013  . Diabetes mellitus type 2 controlled 06/08/2013  . OA (osteoarthritis) of knee 01/18/2013  . CLL (chronic lymphocytic leukemia) 06/17/2012   Outpatient Encounter Prescriptions as of 03/07/2015  Medication Sig  . ADVOCATE LANCETS MISC Check Blood Sugar bid. Dx E11.9  . ALPRAZolam (XANAX) 0.25 MG tablet TAKE 1/2 TABLET TWICE DAILY AS NEEDED, OR TAKE 1 TABLET DAILY AS NEEDED  . esomeprazole (NEXIUM) 40 MG capsule Take 1 capsule (40 mg total) by mouth daily. (Patient taking differently: Take 40 mg by mouth as needed (reflux). )  . fenofibrate micronized (LOFIBRA) 134 MG capsule TAKE ONE CAPSULE BY MOUTH AT BEDTIME  . furosemide (LASIX) 40 MG tablet Take 40 mg by mouth 2 (two) times daily as needed for fluid.   Marland Kitchen glucose blood (FREESTYLE TEST STRIPS) test strip Check Blood sugar bid. Dx E11.9  . glyBURIDE-metformin (GLUCOVANCE) 5-500 MG per tablet TAKE 2 TABLETS BY MOUTH TWICE DAILY  . hydroxypropyl methylcellulose (ISOPTO TEARS) 2.5 % ophthalmic solution Place 1 drop into both eyes 2 (two) times daily as needed (dry eyes).  Marland Kitchen lisinopril (PRINIVIL,ZESTRIL) 10 MG tablet TAKE ONE HALF TABLET BY MOUTH EVERY DAY  . metoprolol succinate (TOPROL-XL) 25 MG 24 hr tablet TAKE ONE TABLET BY MOUTH  ONE TIME DAILY  . pravastatin (PRAVACHOL) 80 MG tablet TAKE ONE TABLET BY MOUTH AT BEDTIME  . traMADol (ULTRAM) 50 MG tablet Take 1-2 tablets (50-100 mg total) by mouth every 6 (six) hours as needed for moderate pain.  Marland Kitchen ZETIA 10 MG tablet TAKE ONE TABLET BY MOUTH EVERY DAY AS DIRECTED  . [DISCONTINUED] estradiol (ESTRACE) 1 MG tablet Take 1 tablet (1 mg total) by mouth daily.   No facility-administered encounter medications on file as of 03/07/2015.      Review of Systems  Constitutional: Negative.   HENT: Negative.   Eyes: Negative.   Respiratory: Negative.   Cardiovascular: Negative.   Gastrointestinal: Negative.        Dark stools  Endocrine: Negative.   Genitourinary: Negative.   Musculoskeletal: Negative.   Skin: Negative.   Allergic/Immunologic: Negative.   Neurological: Negative.   Hematological: Negative.   Psychiatric/Behavioral: Negative.        Objective:   Physical Exam  Constitutional: She is oriented to person, place, and time. She appears well-developed and well-nourished. No distress.  HENT:  Head: Normocephalic and atraumatic.  Right Ear: External ear normal.  Left Ear: External ear normal.  Nose: Nose normal.  Mouth/Throat: Oropharynx is clear and moist. No oropharyngeal exudate.  Eyes: Conjunctivae and EOM are normal. Pupils are equal, round, and reactive to light. Right eye exhibits no discharge. Left eye exhibits no discharge. No scleral icterus.  Neck: Normal range of motion. Neck supple. No thyromegaly present.  No thyromegaly or  carotid bruits  Cardiovascular: Normal rate, regular rhythm and intact distal pulses.  Exam reveals no friction rub.   No murmur heard. The rate is 72/m and regular  Pulmonary/Chest: Effort normal and breath sounds normal. No respiratory distress. She has no wheezes. She has no rales. She exhibits no tenderness.  Abdominal: Soft. Bowel sounds are normal. She exhibits no mass. There is no tenderness. There is no rebound and  no guarding.  Genitourinary:  A rectal exam was done today and the Hemoccult card was negative in the office. She will be given an FOBT to return.  Musculoskeletal: Normal range of motion. She exhibits no edema.  The patient is somewhat unsteady on her feet more with arising and moving.  Lymphadenopathy:    She has no cervical adenopathy.  Neurological: She is alert and oriented to person, place, and time. She has normal reflexes. No cranial nerve deficit.  Skin: Skin is warm and dry. No rash noted.  Psychiatric: She has a normal mood and affect. Her behavior is normal. Judgment and thought content normal.  The patient is alert and answers questions appropriately  Nursing note and vitals reviewed.  BP 108/67 mmHg  Pulse 70  Temp(Src) 98.5 F (36.9 C) (Oral)  Ht 5\' 3"  (1.6 m)  Wt 174 lb (78.926 kg)  BMI 30.83 kg/m2        Assessment & Plan:  1. Type 2 diabetes mellitus without complication -The hemoglobin A1c was 7.8% most recently. She will increase her blood sugar medicine from 1 pill twice a day to 2 pills in the morning and one in the evening and will be given a return appointment to follow-up with the clinical pharmacists in about 4 weeks with outside blood sugar readings.  2. CLL (chronic lymphocytic leukemia) -She has seen the hematologist and he does not think she needs to come back and see her again unless the white count rises significantly. We will continue to monitor this condition.  3. Essential hypertension, benign -The blood pressure today was somewhat on the low end of the range. We will not change her medication.  4. Other fatigue -We will add a thyroid profile to the recent blood work that has been drawn and make sure that her fatigue is not related to thyroid function. The patient's hemoglobin was good her liver tests were good and her kidney function tests were good and the only abnormality on the blood work was her elevated hemoglobin A1c at 7.8%. An in office  Hemoccult card was negative.  No orders of the defined types were placed in this encounter.   Patient Instructions                       Medicare Annual Wellness Visit  [facility] and the medical providers at [facility] strive to bring you the best medical care.  In doing so we not only want to address your current medical conditions and concerns but also to detect new conditions early and prevent illness, disease and health-related problems.    Medicare offers a yearly Wellness Visit which allows our clinical staff to assess your need for preventative services including immunizations, lifestyle education, counseling to decrease risk of preventable diseases and screening for fall risk and other medical concerns.    This visit is provided free of charge (no copay) for all Medicare recipients. The clinical pharmacists at [facility] have begun to conduct these Wellness Visits which will also include  a thorough review of all your medications.    As you primary medical provider recommend that you make an appointment for your Annual Wellness Visit if you have not done so already this year.  You may set up this appointment before you leave today or you may call back ([phone]) and schedule an appointment.  Please make sure when you call that you mention that you are scheduling your Annual Wellness Visit with the clinical pharmacist so that the appointment may be made for the proper length of time.     Continue current medications. Continue good therapeutic lifestyle changes which include good diet and exercise. Fall precautions discussed with patient. If an FOBT was given today- please return it to our front desk. If you are over 83 years old - you may need Prevnar 13 or the adult Pneumonia vaccine.  Flu Shots are still available at our office. If you still haven't had one please call to set up a nurse visit to get one.   After your visit with [place]  today you will receive a survey in the mail or online from [employer] regarding your care with [place]. Please take a moment to fill this out. Your feedback is very important to [place] as you can help [place] better understand your patient needs as well as improve your experience and satisfaction. WE CARE ABOUT YOU!!!   The patient will increase her blood sugar pill to 2 in the morning and one in the evening and will be scheduled to meet with the clinical pharmacists in about 4 weeks at which time she will bring her blood sugar readings to that meeting and hope to see some improvement. She's been taking 1 pill twice a day. She will try to do better with her diet and exercise She will continue to be careful not put herself at risk for falling She would drink plenty of fluids this summer    Arrie Senate MD

## 2015-03-07 NOTE — Patient Instructions (Addendum)
Medicare Annual Wellness Visit  [facility] and the medical providers at [facility] strive to bring you the best medical care.  In doing so we not only want to address your current medical conditions and concerns but also to detect new conditions early and prevent illness, disease and health-related problems.    Medicare offers a yearly Wellness Visit which allows our clinical staff to assess your need for preventative services including immunizations, lifestyle education, counseling to decrease risk of preventable diseases and screening for fall risk and other medical concerns.    This visit is provided free of charge (no copay) for all Medicare recipients. The clinical pharmacists at [facility] have begun to conduct these Wellness Visits which will also include a thorough review of all your medications.    As you primary medical provider recommend that you make an appointment for your Annual Wellness Visit if you have not done so already this year.  You may set up this appointment before you leave today or you may call back ([phone]) and schedule an appointment.  Please make sure when you call that you mention that you are scheduling your Annual Wellness Visit with the clinical pharmacist so that the appointment may be made for the proper length of time.     Continue current medications. Continue good therapeutic lifestyle changes which include good diet and exercise. Fall precautions discussed with patient. If an FOBT was given today- please return it to our front desk. If you are over 51 years old - you may need Prevnar 32 or the adult Pneumonia vaccine.  Flu Shots are still available at our office. If you still haven't had one please call to set up a nurse visit to get one.   After your visit with [place] today you will receive a survey in the mail or online from [employer] regarding your care with [place]. Please take a moment to  fill this out. Your feedback is very important to [place] as you can help [place] better understand your patient needs as well as improve your experience and satisfaction. WE CARE ABOUT YOU!!!   The patient will increase her blood sugar pill to 2 in the morning and one in the evening and will be scheduled to meet with the clinical pharmacists in about 4 weeks at which time she will bring her blood sugar readings to that meeting and hope to see some improvement. She's been taking 1 pill twice a day. She will try to do better with her diet and exercise She will continue to be careful not put herself at risk for falling She would drink plenty of fluids this summer

## 2015-03-08 ENCOUNTER — Other Ambulatory Visit: Payer: Medicare Other

## 2015-03-08 DIAGNOSIS — Z1212 Encounter for screening for malignant neoplasm of rectum: Secondary | ICD-10-CM

## 2015-03-08 LAB — THYROID PANEL WITH TSH
FREE THYROXINE INDEX: 2.3 (ref 1.2–4.9)
T3 Uptake Ratio: 27 % (ref 24–39)
T4 TOTAL: 8.7 ug/dL (ref 4.5–12.0)
TSH: 2.27 u[IU]/mL (ref 0.450–4.500)

## 2015-03-08 LAB — SPECIMEN STATUS REPORT

## 2015-03-09 DIAGNOSIS — Z1212 Encounter for screening for malignant neoplasm of rectum: Secondary | ICD-10-CM | POA: Diagnosis not present

## 2015-03-09 NOTE — Progress Notes (Signed)
Lab only 

## 2015-03-12 LAB — FECAL OCCULT BLOOD, IMMUNOCHEMICAL: Fecal Occult Bld: NEGATIVE

## 2015-03-23 ENCOUNTER — Ambulatory Visit: Payer: Medicare Other | Admitting: *Deleted

## 2015-03-24 ENCOUNTER — Encounter: Payer: Self-pay | Admitting: Family Medicine

## 2015-03-28 ENCOUNTER — Other Ambulatory Visit: Payer: Self-pay | Admitting: Family Medicine

## 2015-03-29 ENCOUNTER — Encounter: Payer: Self-pay | Admitting: *Deleted

## 2015-03-29 ENCOUNTER — Ambulatory Visit (INDEPENDENT_AMBULATORY_CARE_PROVIDER_SITE_OTHER): Payer: Medicare Other | Admitting: *Deleted

## 2015-03-29 VITALS — BP 113/59 | HR 66 | Ht 62.0 in | Wt 172.0 lb

## 2015-03-29 DIAGNOSIS — Z0000 Encounter for general adult medical examination without abnormal findings: Secondary | ICD-10-CM

## 2015-03-29 NOTE — Patient Instructions (Signed)
Continue to stay active. Move carefully to avoid falls. Eat a balanced diet with lean proteins, fruits, and vegetables.  Keep regularly scheduled appointments Continue current medications Health Maintenance Adopting a healthy lifestyle and getting preventive care can go a long way to promote health and wellness. Talk with your health care provider about what schedule of regular examinations is right for you. This is a good chance for you to check in with your provider about disease prevention and staying healthy. In between checkups, there are plenty of things you can do on your own. Experts have done a lot of research about which lifestyle changes and preventive measures are most likely to keep you healthy. Ask your health care provider for more information. WEIGHT AND DIET  Eat a healthy diet  Be sure to include plenty of vegetables, fruits, low-fat dairy products, and lean protein.  Do not eat a lot of foods high in solid fats, added sugars, or salt.  Get regular exercise. This is one of the most important things you can do for your health.  Most adults should exercise for at least 150 minutes each week. The exercise should increase your heart rate and make you sweat (moderate-intensity exercise).  Most adults should also do strengthening exercises at least twice a week. This is in addition to the moderate-intensity exercise.  Maintain a healthy weight  Body mass index (BMI) is a measurement that can be used to identify possible weight problems. It estimates body fat based on height and weight. Your health care provider can help determine your BMI and help you achieve or maintain a healthy weight.  For females 94 years of age and older:   A BMI below 18.5 is considered underweight.  A BMI of 18.5 to 24.9 is normal.  A BMI of 25 to 29.9 is considered overweight.  A BMI of 30 and above is considered obese.  Watch levels of cholesterol and blood lipids  You should start having  your blood tested for lipids and cholesterol at 79 years of age, then have this test every 5 years.  You may need to have your cholesterol levels checked more often if:  Your lipid or cholesterol levels are high.  You are older than 79 years of age.  You are at high risk for heart disease.  CANCER SCREENING   Lung Cancer  Lung cancer screening is recommended for adults 42-1 years old who are at high risk for lung cancer because of a history of smoking.  A yearly low-dose CT scan of the lungs is recommended for people who:  Currently smoke.  Have quit within the past 15 years.  Have at least a 30-pack-year history of smoking. A pack year is smoking an average of one pack of cigarettes a day for 1 year.  Yearly screening should continue until it has been 15 years since you quit.  Yearly screening should stop if you develop a health problem that would prevent you from having lung cancer treatment.  Breast Cancer  Practice breast self-awareness. This means understanding how your breasts normally appear and feel.  It also means doing regular breast self-exams. Let your health care provider know about any changes, no matter how small.  If you are in your 20s or 30s, you should have a clinical breast exam (CBE) by a health care provider every 1-3 years as part of a regular health exam.  If you are 43 or older, have a CBE every year. Also consider having a breast  X-ray (mammogram) every year.  If you have a family history of breast cancer, talk to your health care provider about genetic screening.  If you are at high risk for breast cancer, talk to your health care provider about having an MRI and a mammogram every year.  Breast cancer gene (BRCA) assessment is recommended for women who have family members with BRCA-related cancers. BRCA-related cancers include:  Breast.  Ovarian.  Tubal.  Peritoneal cancers.  Results of the assessment will determine the need for genetic  counseling and BRCA1 and BRCA2 testing. Cervical Cancer Routine pelvic examinations to screen for cervical cancer are no longer recommended for nonpregnant women who are considered low risk for cancer of the pelvic organs (ovaries, uterus, and vagina) and who do not have symptoms. A pelvic examination may be necessary if you have symptoms including those associated with pelvic infections. Ask your health care provider if a screening pelvic exam is right for you.   The Pap test is the screening test for cervical cancer for women who are considered at risk.  If you had a hysterectomy for a problem that was not cancer or a condition that could lead to cancer, then you no longer need Pap tests.  If you are older than 65 years, and you have had normal Pap tests for the past 10 years, you no longer need to have Pap tests.  If you have had past treatment for cervical cancer or a condition that could lead to cancer, you need Pap tests and screening for cancer for at least 20 years after your treatment.  If you no longer get a Pap test, assess your risk factors if they change (such as having a new sexual partner). This can affect whether you should start being screened again.  Some women have medical problems that increase their chance of getting cervical cancer. If this is the case for you, your health care provider may recommend more frequent screening and Pap tests.  The human papillomavirus (HPV) test is another test that may be used for cervical cancer screening. The HPV test looks for the virus that can cause cell changes in the cervix. The cells collected during the Pap test can be tested for HPV.  The HPV test can be used to screen women 68 years of age and older. Getting tested for HPV can extend the interval between normal Pap tests from three to five years.  An HPV test also should be used to screen women of any age who have unclear Pap test results.  After 79 years of age, women should have  HPV testing as often as Pap tests.  Colorectal Cancer  This type of cancer can be detected and often prevented.  Routine colorectal cancer screening usually begins at 79 years of age and continues through 79 years of age.  Your health care provider may recommend screening at an earlier age if you have risk factors for colon cancer.  Your health care provider may also recommend using home test kits to check for hidden blood in the stool.  A small camera at the end of a tube can be used to examine your colon directly (sigmoidoscopy or colonoscopy). This is done to check for the earliest forms of colorectal cancer.  Routine screening usually begins at age 91.  Direct examination of the colon should be repeated every 5-10 years through 78 years of age. However, you may need to be screened more often if early forms of precancerous polyps or small  growths are found. Skin Cancer  Check your skin from head to toe regularly.  Tell your health care provider about any new moles or changes in moles, especially if there is a change in a mole's shape or color.  Also tell your health care provider if you have a mole that is larger than the size of a pencil eraser.  Always use sunscreen. Apply sunscreen liberally and repeatedly throughout the day.  Protect yourself by wearing long sleeves, pants, a wide-brimmed hat, and sunglasses whenever you are outside. HEART DISEASE, DIABETES, AND HIGH BLOOD PRESSURE   Have your blood pressure checked at least every 1-2 years. High blood pressure causes heart disease and increases the risk of stroke.  If you are between 63 years and 34 years old, ask your health care provider if you should take aspirin to prevent strokes.  Have regular diabetes screenings. This involves taking a blood sample to check your fasting blood sugar level.  If you are at a normal weight and have a low risk for diabetes, have this test once every three years after 79 years of  age.  If you are overweight and have a high risk for diabetes, consider being tested at a younger age or more often. PREVENTING INFECTION  Hepatitis B  If you have a higher risk for hepatitis B, you should be screened for this virus. You are considered at high risk for hepatitis B if:  You were born in a country where hepatitis B is common. Ask your health care provider which countries are considered high risk.  Your parents were born in a high-risk country, and you have not been immunized against hepatitis B (hepatitis B vaccine).  You have HIV or AIDS.  You use needles to inject street drugs.  You live with someone who has hepatitis B.  You have had sex with someone who has hepatitis B.  You get hemodialysis treatment.  You take certain medicines for conditions, including cancer, organ transplantation, and autoimmune conditions. Hepatitis C  Blood testing is recommended for:  Everyone born from [birth year] through 1963.  Anyone with known risk factors for hepatitis C. Sexually transmitted infections (STIs)  You should be screened for sexually transmitted infections (STIs) including gonorrhea and chlamydia if:  You are sexually active and are younger than 79 years of age.  You are older than 79 years of age and your health care provider tells you that you are at risk for this type of infection.  Your sexual activity has changed since you were last screened and you are at an increased risk for chlamydia or gonorrhea. Ask your health care provider if you are at risk.  If you do not have HIV, but are at risk, it may be recommended that you take a prescription medicine daily to prevent HIV infection. This is called pre-exposure prophylaxis (PrEP). You are considered at risk if:  You are sexually active and do not regularly use condoms or know the HIV status of your partner(s).  You take drugs by injection.  You are sexually active with a partner who has HIV. Talk with your health  care provider about whether you are at high risk of being infected with HIV. If you choose to begin PrEP, you should first be tested for HIV. You should then be tested every 3 months for as long as you are taking PrEP.  PREGNANCY   If you are premenopausal and you may become pregnant, ask your health care provider about preconception counseling.  If you may become pregnant, take 400 to 800 micrograms (mcg) of folic acid every day.  If you want to prevent pregnancy, talk to your health care provider about birth control (contraception). OSTEOPOROSIS AND MENOPAUSE   Osteoporosis is a disease in which the bones lose minerals and strength with aging. This can result in serious bone fractures. Your risk for osteoporosis can be identified using a bone density scan.  If you are 51 years of age or older, or if you are at risk for osteoporosis and fractures, ask your health care provider if you should be screened.  Ask your health care provider whether you should take a calcium or vitamin D supplement to lower your risk for osteoporosis.  Menopause may have certain physical symptoms and risks.  Hormone replacement therapy may reduce some of these symptoms and risks. Talk to your health care provider about whether hormone replacement therapy is right for you.  HOME CARE INSTRUCTIONS   Schedule regular health, dental, and eye exams.  Stay current with your immunizations.   Do not use any tobacco products including cigarettes, chewing tobacco, or electronic cigarettes.  If you are pregnant, do not drink alcohol.  If you are breastfeeding, limit how much and how often you drink alcohol.  Limit alcohol intake to no more than 1 drink per day for nonpregnant women. One drink equals 12 ounces of beer, 5 ounces of wine, or 1 ounces of hard liquor.  Do not use street drugs.  Do not share needles.  Ask your health care provider for help if you need support or information about quitting  drugs.  Tell your health care provider if you often feel depressed.  Tell your health care provider if you have ever been abused or do not feel safe at home. Document Released: 04/01/2011 Document Revised: 01/31/2014 Document Reviewed: 08/18/2013 [facility] Patient Information 2015 [place], [place]. This information is not intended to replace advice given to you by your health care provider. Make sure you discuss any questions you have with your health care provider.

## 2015-03-30 ENCOUNTER — Encounter: Payer: Self-pay | Admitting: *Deleted

## 2015-03-30 NOTE — Progress Notes (Signed)
Patient ID: Kristy [name], female   DOB: [birth year], 81 y.o.   MRN: [id number]    Subjective:   Kristy [name] is a 81 y.o. female who presents for an Initial Medicare Annual Wellness Visit. Kristy [name] is married and lives at home with her husband. She has one living son and one that is deceased. She has 3 grandchildren. She is active around her home and continues to cook and housekeep.   Review of Systems     Cardiac Risk Factors include: advanced age (>71men, >35 women);diabetes mellitus;dyslipidemia;hypertension;sedentary lifestyle;obesity (BMI >30kg/m2)  HEENT: Hearing deficit. Wears hearing aids.   Musculoskeletal: Continues to have some difficulty s/p pelvic fracture but this is improving.   Other systems negative.     Objective:    Today's Vitals   03/29/15 1437  BP: 113/59  Pulse: 66  Height: 5\' 2"  (1.575 m)  Weight: 172 lb (78.019 kg)    Current Medications (verified) Outpatient Encounter Prescriptions as of 03/29/2015  Medication Sig  . ADVOCATE LANCETS MISC Check Blood Sugar bid. Dx E11.9  . ALPRAZolam (XANAX) 0.25 MG tablet TAKE 1/2 TABLET TWICE DAILY AS NEEDED, OR TAKE 1 TABLET DAILY AS NEEDED  . aspirin EC 81 MG tablet Take 81 mg by mouth daily.  . Black Cohosh 40 MG CAPS Take 1 capsule by mouth 2 (two) times daily.  . cholecalciferol (VITAMIN D) 1000 UNITS tablet Take 1,000 Units by mouth daily.  Marland Kitchen esomeprazole (NEXIUM) 40 MG capsule Take 1 capsule (40 mg total) by mouth daily. (Patient taking differently: Take 40 mg by mouth as needed (reflux). )  . fenofibrate micronized (LOFIBRA) 134 MG capsule TAKE ONE CAPSULE BY MOUTH AT BEDTIME  . furosemide (LASIX) 40 MG tablet Take 40 mg by mouth 2 (two) times daily as needed for fluid.   Marland Kitchen glucose blood (FREESTYLE TEST STRIPS) test strip Check Blood sugar bid. Dx E11.9  . glyBURIDE-metformin (GLUCOVANCE) 5-500 MG per tablet TAKE 2 TABLETS BY MOUTH TWICE DAILY  . hydroxypropyl methylcellulose (ISOPTO TEARS) 2.5 % ophthalmic  solution Place 1 drop into both eyes 2 (two) times daily as needed (dry eyes).  Marland Kitchen lisinopril (PRINIVIL,ZESTRIL) 10 MG tablet TAKE ONE HALF TABLET BY MOUTH EVERY DAY  . metoprolol succinate (TOPROL-XL) 25 MG 24 hr tablet TAKE ONE TABLET BY MOUTH ONE TIME DAILY  . Omega-3 Fatty Acids (FISH OIL) 1000 MG CAPS Take 1 capsule by mouth daily.  . pravastatin (PRAVACHOL) 80 MG tablet TAKE ONE TABLET BY MOUTH AT BEDTIME  . ZETIA 10 MG tablet TAKE ONE TABLET BY MOUTH EVERY DAY AS DIRECTED  . [DISCONTINUED] traMADol (ULTRAM) 50 MG tablet Take 1-2 tablets (50-100 mg total) by mouth every 6 (six) hours as needed for moderate pain.   No facility-administered encounter medications on file as of 03/29/2015.    Allergies (verified) Vioxx   History: Past Medical History  Diagnosis Date  . Diabetes mellitus   . Hypercholesteremia   . Colon polyps   . Diverticulitis   . Anxiety   . H/O hiatal hernia   . Hypertension     Dr. Laurance Flatten  . History of shingles 01-07-13    3'14 recent outbreak- drying in   . Lumbar spondylosis     osteoarthritis-knees  . Hearing loss 01-07-13    bilateral hearing aids  . Macular degeneration of both eyes     and dry eyes  . Complication of anesthesia 01-18-2013    slow to awaken and felt crazy for 3-4 days  Past Surgical History  Procedure Laterality Date  . Cataract extraction    . Lumbar back surgery  2013  . Ventral hernia repair    . Knee arthroscopy  01-07-13    left knee- many yrs ago  . Total knee arthroplasty Left 01/18/2013    Procedure: TOTAL KNEE ARTHROPLASTY;  Surgeon: Gearlean Alf, MD;  Location: [facility];  Service: Orthopedics;  Laterality: Left;  . Cholecystectomy  1989    open  . Abdominal hysterectomy  1987  . Total knee arthroplasty Right 03/07/2014    Procedure: RIGHT TOTAL KNEE ARTHROPLASTY;  Surgeon: Gearlean Alf, MD;  Location: [facility];  Service: Orthopedics;  Laterality: Right;   Family History  Problem Relation Age of Onset  . Stroke Father  17  . Healthy Mother   . Healthy Son   . Bipolar disorder Son    Social History   Occupational History  . Not on file.   Social History Main Topics  . Smoking status: Never Smoker   . Smokeless tobacco: Never Used  . Alcohol Use: No  . Drug Use: No  . Sexual Activity: Not Currently    Activities of Daily Living In your present state of health, do you have any difficulty performing the following activities: 03/29/2015  Hearing? Y  Vision? Y  Difficulty concentrating or making decisions? N  Walking or climbing stairs? N  Dressing or bathing? N  Doing errands, shopping? N  Preparing Food and eating ? N  Using the Toilet? N  In the past six months, have you accidently leaked urine? N  Do you have problems with loss of bowel control? N  Managing your Medications? N  Managing your Finances? N  Housekeeping or managing your Housekeeping? N    Immunizations and Health Maintenance Immunization History  Administered Date(s) Administered  . Influenza,inj,Quad PF,36+ Mos 07/06/2013, 07/18/2014  . Pneumococcal Conjugate-13 09/21/2013  . Pneumococcal Polysaccharide-23 08/19/1995  . Tdap 07/16/2011  . Zoster 07/01/2007   Health Maintenance Due  Topic Date Due  . PNA vac Low Risk Adult (2 of 2 - PPSV23) 09/21/2014  . OPHTHALMOLOGY EXAM  03/15/2015  Pneumonia vaccines are complete. Last eye exam was 07/2014 and she has a follow up in July 2016.   Patient Care Team: Chipper Herb, MD as PCP - General (Family Medicine) Gaynelle Arabian, MD as Consulting Physician (Orthopedic Surgery) Minus Breeding, MD as Consulting Physician (Cardiology) Irine Seal, MD as Attending Physician (Urology) Sable Feil, MD as Consulting Physician (Gastroenterology) Rutherford Guys, MD as Consulting Physician (Opthalmology)  Indicate any recent Medical Services you may have received from other than [facility] providers in the past year (date may be approximate). -Eye exam with Dr Gershon Crane in 07/2014       Assessment:   This is a routine wellness examination for Kristy [name].   Hearing/Vision screen No vision deficit noted during visit. Patient does have a hearing deficit which is helped with hearing aids.   Dietary issues and exercise activities discussed: Current Exercise Habits:: The patient does not participate in regular exercise at present (Patient does stay busy working around her house. She cooks and keeps her housework up.)   Depression Screen PHQ 2/9 Scores 03/29/2015 03/07/2015 01/03/2015 12/05/2014 10/20/2014 06/14/2014  PHQ - 2 Score 0 0 0 0 0 0    Fall Risk Fall Risk  03/29/2015 03/07/2015 01/03/2015 12/05/2014 10/20/2014  Falls in the past year? Yes Yes Yes Yes No  Number falls in past yr: 1 1 1  1 -  Injury with Fall? Yes Yes Yes Yes -  Risk for fall due to : History of fall(s) - - - -  Follow up Falls prevention discussed - - - -    Cognitive Function: MMSE - Mini Mental State Exam 03/29/2015  Orientation to time 5  Orientation to Place 5  Registration 3  Attention/ Calculation 4  Recall 3  Language- name 2 objects 2  Language- repeat 1  Language- follow 3 step command 3  Language- read & follow direction 1  Write a sentence 1  No deficit noted  Screening Tests Health Maintenance  Topic Date Due  . PNA vac Low Risk Adult (2 of 2 - PPSV23) 09/21/2014  . OPHTHALMOLOGY EXAM  03/15/2015  . INFLUENZA VACCINE  05/01/2015  . FOOT EXAM  06/15/2015  . URINE MICROALBUMIN  06/15/2015  . HEMOGLOBIN A1C  09/01/2015  . COLONOSCOPY  09/08/2016  . TETANUS/TDAP  07/15/2021  . DEXA SCAN  Completed  . ZOSTAVAX  Completed      Plan:   Move carefully to avoid falls. Continue to stay active. Keep regularly scheduled appointments Continue all medications  During the course of the visit, Kristy [name] was educated and counseled about the following appropriate screening and preventive services:   Vaccines to include Pneumoccal-complete, Influenza-suggested annually, Td-up to date,  Zostavax-complete  Electrocardiogram-Done 02/08/15  Cardiovascular disease screening-Lipids screened at routine office visit  Colorectal cancer screening-FOBT done 03/09/15  Bone density screening-Done 02/07/15  Glaucoma screening-Eye exam 08/2015. F/u sched for July. Will request report.   Mammography/PAP-No more needed  Nutrition counseling-Balanced diet with lean proteins and vegetables. Limit carbohydrates.    Patient Instructions (the written plan) were given to the patient.    Chong Sicilian, RN   03/30/2015      I have reviewed and agree with the above AWV documentation.  Claretta Fraise, M.D.

## 2015-04-13 DIAGNOSIS — H3531 Nonexudative age-related macular degeneration: Secondary | ICD-10-CM | POA: Diagnosis not present

## 2015-04-13 DIAGNOSIS — Z961 Presence of intraocular lens: Secondary | ICD-10-CM | POA: Diagnosis not present

## 2015-04-13 LAB — HM DIABETES EYE EXAM

## 2015-04-20 ENCOUNTER — Encounter (INDEPENDENT_AMBULATORY_CARE_PROVIDER_SITE_OTHER): Payer: Medicare Other | Admitting: Ophthalmology

## 2015-04-20 DIAGNOSIS — H43813 Vitreous degeneration, bilateral: Secondary | ICD-10-CM | POA: Diagnosis not present

## 2015-04-20 DIAGNOSIS — H318 Other specified disorders of choroid: Secondary | ICD-10-CM

## 2015-04-20 DIAGNOSIS — H35033 Hypertensive retinopathy, bilateral: Secondary | ICD-10-CM

## 2015-04-20 DIAGNOSIS — H3531 Nonexudative age-related macular degeneration: Secondary | ICD-10-CM

## 2015-04-20 DIAGNOSIS — I10 Essential (primary) hypertension: Secondary | ICD-10-CM

## 2015-04-24 ENCOUNTER — Telehealth: Payer: Self-pay | Admitting: *Deleted

## 2015-04-24 ENCOUNTER — Other Ambulatory Visit: Payer: Self-pay | Admitting: *Deleted

## 2015-04-24 NOTE — Telephone Encounter (Signed)
Medication added to patient's med list per patient

## 2015-04-27 ENCOUNTER — Ambulatory Visit (INDEPENDENT_AMBULATORY_CARE_PROVIDER_SITE_OTHER): Payer: Medicare Other | Admitting: Ophthalmology

## 2015-04-27 DIAGNOSIS — H318 Other specified disorders of choroid: Secondary | ICD-10-CM | POA: Diagnosis not present

## 2015-04-27 DIAGNOSIS — H35033 Hypertensive retinopathy, bilateral: Secondary | ICD-10-CM

## 2015-04-27 DIAGNOSIS — I10 Essential (primary) hypertension: Secondary | ICD-10-CM | POA: Diagnosis not present

## 2015-05-02 ENCOUNTER — Other Ambulatory Visit: Payer: Self-pay | Admitting: Family Medicine

## 2015-05-03 ENCOUNTER — Telehealth: Payer: Self-pay | Admitting: Family Medicine

## 2015-05-03 ENCOUNTER — Other Ambulatory Visit (INDEPENDENT_AMBULATORY_CARE_PROVIDER_SITE_OTHER): Payer: Medicare Other

## 2015-05-03 DIAGNOSIS — R300 Dysuria: Secondary | ICD-10-CM

## 2015-05-03 DIAGNOSIS — R399 Unspecified symptoms and signs involving the genitourinary system: Secondary | ICD-10-CM

## 2015-05-03 LAB — POCT URINALYSIS DIPSTICK
Bilirubin, UA: NEGATIVE
GLUCOSE UA: NEGATIVE
Ketones, UA: NEGATIVE
Nitrite, UA: NEGATIVE
PH UA: 7.5
Spec Grav, UA: <=1.005
Urobilinogen, UA: NEGATIVE

## 2015-05-03 LAB — POCT UA - MICROSCOPIC ONLY
CASTS, UR, LPF, POC: NEGATIVE
Crystals, Ur, HPF, POC: NEGATIVE
Epithelial cells, urine per micros: NEGATIVE
MUCUS UA: NEGATIVE
Yeast, UA: NEGATIVE

## 2015-05-03 MED ORDER — SULFAMETHOXAZOLE-TRIMETHOPRIM 800-160 MG PO TABS: 1.0000 | ORAL_TABLET | Freq: Two times a day (BID) | ORAL | Status: DC

## 2015-05-03 NOTE — Telephone Encounter (Signed)
Tried to contact patient but phone was busy. Will try again later. Patient may bring in a specimen depending on description of symptoms. Marland Kitchen

## 2015-05-03 NOTE — Telephone Encounter (Signed)
Patient calls again. Says she hasn't heard anything. Advised her to bring in specimen.

## 2015-05-03 NOTE — Addendum Note (Signed)
Addended by: Ilean China on: 05/03/2015 02:00 PM   Modules accepted: Orders

## 2015-05-03 NOTE — Telephone Encounter (Addendum)
Urine showed infection. Antibiotic sent in electronically to [facility].  Patient aware.  She will follow up if symptoms worsen or persist.

## 2015-05-03 NOTE — Progress Notes (Signed)
Lab only 

## 2015-05-05 LAB — URINE CULTURE

## 2015-05-09 ENCOUNTER — Other Ambulatory Visit: Payer: Self-pay | Admitting: Family Medicine

## 2015-05-16 ENCOUNTER — Other Ambulatory Visit (INDEPENDENT_AMBULATORY_CARE_PROVIDER_SITE_OTHER): Payer: Medicare Other

## 2015-05-16 DIAGNOSIS — Z87440 Personal history of urinary (tract) infections: Secondary | ICD-10-CM | POA: Diagnosis not present

## 2015-05-16 LAB — POCT UA - MICROSCOPIC ONLY
BACTERIA, U MICROSCOPIC: NEGATIVE
Casts, Ur, LPF, POC: NEGATIVE
Crystals, Ur, HPF, POC: NEGATIVE
MUCUS UA: NEGATIVE
RBC, urine, microscopic: NEGATIVE
WBC, Ur, HPF, POC: NEGATIVE
YEAST UA: NEGATIVE

## 2015-05-16 LAB — POCT URINALYSIS DIPSTICK
BILIRUBIN UA: NEGATIVE
Blood, UA: NEGATIVE
Glucose, UA: NEGATIVE
KETONES UA: NEGATIVE
Leukocytes, UA: NEGATIVE
Nitrite, UA: NEGATIVE
PROTEIN UA: NEGATIVE
SPEC GRAV UA: 1.01
Urobilinogen, UA: NEGATIVE
pH, UA: 6

## 2015-05-16 NOTE — Progress Notes (Signed)
Lab only 

## 2015-05-19 ENCOUNTER — Other Ambulatory Visit: Payer: Self-pay | Admitting: Family Medicine

## 2015-05-19 DIAGNOSIS — H401290 Low-tension glaucoma, unspecified eye, stage unspecified: Secondary | ICD-10-CM | POA: Diagnosis not present

## 2015-05-19 DIAGNOSIS — H02834 Dermatochalasis of left upper eyelid: Secondary | ICD-10-CM | POA: Diagnosis not present

## 2015-05-19 DIAGNOSIS — H02831 Dermatochalasis of right upper eyelid: Secondary | ICD-10-CM | POA: Diagnosis not present

## 2015-05-19 DIAGNOSIS — E119 Type 2 diabetes mellitus without complications: Secondary | ICD-10-CM | POA: Diagnosis not present

## 2015-05-29 ENCOUNTER — Encounter (INDEPENDENT_AMBULATORY_CARE_PROVIDER_SITE_OTHER): Payer: Medicare Other | Admitting: Ophthalmology

## 2015-05-29 DIAGNOSIS — H318 Other specified disorders of choroid: Secondary | ICD-10-CM | POA: Diagnosis not present

## 2015-05-29 DIAGNOSIS — H35033 Hypertensive retinopathy, bilateral: Secondary | ICD-10-CM | POA: Diagnosis not present

## 2015-05-29 DIAGNOSIS — H3531 Nonexudative age-related macular degeneration: Secondary | ICD-10-CM | POA: Diagnosis not present

## 2015-05-29 DIAGNOSIS — I10 Essential (primary) hypertension: Secondary | ICD-10-CM

## 2015-06-06 ENCOUNTER — Other Ambulatory Visit: Payer: Medicare Other

## 2015-06-06 DIAGNOSIS — R300 Dysuria: Secondary | ICD-10-CM | POA: Diagnosis not present

## 2015-06-06 NOTE — Progress Notes (Signed)
Lab only 

## 2015-06-08 ENCOUNTER — Other Ambulatory Visit: Payer: Self-pay | Admitting: Family Medicine

## 2015-06-08 LAB — URINE CULTURE

## 2015-06-08 NOTE — Telephone Encounter (Signed)
Last seen 03/07/15  [facility]  If approved route to nurse to call into [facility]

## 2015-06-08 NOTE — Telephone Encounter (Signed)
This is okay to refill 

## 2015-06-09 ENCOUNTER — Other Ambulatory Visit: Payer: Self-pay | Admitting: *Deleted

## 2015-06-09 MED ORDER — AMOXICILLIN-POT CLAVULANATE 875-125 MG PO TABS: 1.0000 | ORAL_TABLET | Freq: Two times a day (BID) | ORAL | Status: DC

## 2015-06-09 NOTE — Telephone Encounter (Signed)
Left refill authorization on voicemail 

## 2015-06-14 ENCOUNTER — Telehealth: Payer: Self-pay | Admitting: Family Medicine

## 2015-06-14 NOTE — Telephone Encounter (Signed)
clumsy Staggering Not dizzy maybe slightly light headed No more dysuria Urine symptoms are better  Advised her to stop the antibiotic and come in Friday for repeat urine And culture.

## 2015-06-16 ENCOUNTER — Other Ambulatory Visit (INDEPENDENT_AMBULATORY_CARE_PROVIDER_SITE_OTHER): Payer: Medicare Other

## 2015-06-16 DIAGNOSIS — R399 Unspecified symptoms and signs involving the genitourinary system: Secondary | ICD-10-CM

## 2015-06-16 LAB — POCT URINALYSIS DIPSTICK
Bilirubin, UA: NEGATIVE
Blood, UA: NEGATIVE
GLUCOSE UA: NEGATIVE
Ketones, UA: NEGATIVE
NITRITE UA: NEGATIVE
PH UA: 7
Protein, UA: NEGATIVE
Spec Grav, UA: 1.015
UROBILINOGEN UA: NEGATIVE

## 2015-06-16 LAB — POCT UA - MICROSCOPIC ONLY
Bacteria, U Microscopic: NEGATIVE
Casts, Ur, LPF, POC: NEGATIVE
Crystals, Ur, HPF, POC: NEGATIVE
Mucus, UA: NEGATIVE
RBC, URINE, MICROSCOPIC: NEGATIVE
Yeast, UA: NEGATIVE

## 2015-06-16 NOTE — Addendum Note (Signed)
Addended by: Selmer Dominion on: 06/16/2015 09:34 AM   Modules accepted: Orders

## 2015-06-16 NOTE — Progress Notes (Signed)
Lab only 

## 2015-06-18 LAB — URINE CULTURE

## 2015-06-21 ENCOUNTER — Other Ambulatory Visit (INDEPENDENT_AMBULATORY_CARE_PROVIDER_SITE_OTHER): Payer: Medicare Other

## 2015-06-21 DIAGNOSIS — R399 Unspecified symptoms and signs involving the genitourinary system: Secondary | ICD-10-CM

## 2015-06-21 LAB — POCT UA - MICROSCOPIC ONLY
CASTS, UR, LPF, POC: NEGATIVE
CRYSTALS, UR, HPF, POC: NEGATIVE
Mucus, UA: NEGATIVE
YEAST UA: NEGATIVE

## 2015-06-21 LAB — POCT URINALYSIS DIPSTICK
BILIRUBIN UA: NEGATIVE
Glucose, UA: NEGATIVE
Ketones, UA: NEGATIVE
NITRITE UA: NEGATIVE
PH UA: 6.5
Protein, UA: NEGATIVE
SPEC GRAV UA: 1.01
Urobilinogen, UA: NEGATIVE

## 2015-06-21 NOTE — Progress Notes (Signed)
Lab only 

## 2015-06-23 ENCOUNTER — Telehealth: Payer: Self-pay | Admitting: Family Medicine

## 2015-06-23 NOTE — Telephone Encounter (Signed)
Continues to have some urinary frequency. Dysuria has improved.  She feels that she is getting better. Advised that culture results are not back yet. We will call when they are and she will let us know if her symptoms worsen.

## 2015-06-24 LAB — URINE CULTURE

## 2015-06-26 ENCOUNTER — Other Ambulatory Visit: Payer: Self-pay | Admitting: *Deleted

## 2015-06-26 MED ORDER — SULFAMETHOXAZOLE-TRIMETHOPRIM 800-160 MG PO TABS: 1.0000 | ORAL_TABLET | Freq: Two times a day (BID) | ORAL | Status: DC

## 2015-07-05 ENCOUNTER — Other Ambulatory Visit: Payer: Self-pay | Admitting: Family Medicine

## 2015-07-05 ENCOUNTER — Other Ambulatory Visit (INDEPENDENT_AMBULATORY_CARE_PROVIDER_SITE_OTHER): Payer: Medicare Other

## 2015-07-05 DIAGNOSIS — E785 Hyperlipidemia, unspecified: Secondary | ICD-10-CM

## 2015-07-05 DIAGNOSIS — I10 Essential (primary) hypertension: Secondary | ICD-10-CM | POA: Diagnosis not present

## 2015-07-05 DIAGNOSIS — C9110 Chronic lymphocytic leukemia of B-cell type not having achieved remission: Secondary | ICD-10-CM | POA: Diagnosis not present

## 2015-07-05 DIAGNOSIS — E559 Vitamin D deficiency, unspecified: Secondary | ICD-10-CM

## 2015-07-05 DIAGNOSIS — E119 Type 2 diabetes mellitus without complications: Secondary | ICD-10-CM

## 2015-07-05 DIAGNOSIS — R300 Dysuria: Secondary | ICD-10-CM | POA: Diagnosis not present

## 2015-07-05 LAB — POCT URINALYSIS DIPSTICK
Bilirubin, UA: NEGATIVE
Blood, UA: NEGATIVE
Glucose, UA: NEGATIVE
KETONES UA: NEGATIVE
LEUKOCYTES UA: NEGATIVE
Nitrite, UA: NEGATIVE
PH UA: 7
PROTEIN UA: NEGATIVE
SPEC GRAV UA: 1.015
Urobilinogen, UA: NEGATIVE

## 2015-07-05 LAB — POCT UA - MICROSCOPIC ONLY
CASTS, UR, LPF, POC: NEGATIVE
Crystals, Ur, HPF, POC: NEGATIVE
Mucus, UA: NEGATIVE
RBC, urine, microscopic: NEGATIVE
YEAST UA: NEGATIVE

## 2015-07-05 LAB — POCT GLYCOSYLATED HEMOGLOBIN (HGB A1C): Hemoglobin A1C: 7.1

## 2015-07-06 LAB — BMP8+EGFR
BUN/Creatinine Ratio: 12 (ref 11–26)
BUN: 14 mg/dL (ref 8–27)
CALCIUM: 9.9 mg/dL (ref 8.7–10.3)
CO2: 21 mmol/L (ref 18–29)
CREATININE: 1.16 mg/dL — AB (ref 0.57–1.00)
Chloride: 106 mmol/L (ref 97–108)
GFR calc Af Amer: 50 mL/min/{1.73_m2} — ABNORMAL LOW (ref >59)
GFR, EST NON AFRICAN AMERICAN: 43 mL/min/{1.73_m2} — AB (ref >59)
Glucose: 119 mg/dL — ABNORMAL HIGH (ref 65–99)
POTASSIUM: 5.7 mmol/L — AB (ref 3.5–5.2)
Sodium: 143 mmol/L (ref 134–144)

## 2015-07-06 LAB — CBC WITH DIFFERENTIAL/PLATELET
BASOS: 0 %
Basophils Absolute: 0 10*3/uL (ref 0.0–0.2)
EOS (ABSOLUTE): 0.2 10*3/uL (ref 0.0–0.4)
EOS: 2 %
HEMATOCRIT: 38.1 % (ref 34.0–46.6)
Hemoglobin: 12.2 g/dL (ref 11.1–15.9)
IMMATURE GRANS (ABS): 0 10*3/uL (ref 0.0–0.1)
Immature Granulocytes: 0 %
LYMPHS: 58 %
Lymphocytes Absolute: 6.4 10*3/uL — ABNORMAL HIGH (ref 0.7–3.1)
MCH: 29.3 pg (ref 26.6–33.0)
MCHC: 32 g/dL (ref 31.5–35.7)
MCV: 91 fL (ref 79–97)
Monocytes Absolute: 0.8 10*3/uL (ref 0.1–0.9)
Monocytes: 7 %
NEUTROS ABS: 3.6 10*3/uL (ref 1.4–7.0)
Neutrophils: 33 %
PLATELETS: 192 10*3/uL (ref 150–379)
RBC: 4.17 x10E6/uL (ref 3.77–5.28)
RDW: 13.8 % (ref 12.3–15.4)
WBC: 11 10*3/uL — ABNORMAL HIGH (ref 3.4–10.8)

## 2015-07-06 LAB — HEPATIC FUNCTION PANEL
ALBUMIN: 4.2 g/dL (ref 3.5–4.7)
ALK PHOS: 35 IU/L — AB (ref 39–117)
ALT: 11 IU/L (ref 0–32)
AST: 13 IU/L (ref 0–40)
BILIRUBIN TOTAL: 0.3 mg/dL (ref 0.0–1.2)
Bilirubin, Direct: 0.13 mg/dL (ref 0.00–0.40)
TOTAL PROTEIN: 6.2 g/dL (ref 6.0–8.5)

## 2015-07-06 LAB — VITAMIN D 25 HYDROXY (VIT D DEFICIENCY, FRACTURES): VIT D 25 HYDROXY: 33.2 ng/mL (ref 30.0–100.0)

## 2015-07-06 LAB — NMR, LIPOPROFILE

## 2015-07-07 ENCOUNTER — Encounter: Payer: Self-pay | Admitting: Family Medicine

## 2015-07-07 ENCOUNTER — Ambulatory Visit (INDEPENDENT_AMBULATORY_CARE_PROVIDER_SITE_OTHER): Payer: Medicare Other | Admitting: Family Medicine

## 2015-07-07 VITALS — BP 123/83 | HR 71 | Temp 97.4°F | Ht 62.0 in | Wt 169.0 lb

## 2015-07-07 DIAGNOSIS — C9110 Chronic lymphocytic leukemia of B-cell type not having achieved remission: Secondary | ICD-10-CM | POA: Diagnosis not present

## 2015-07-07 DIAGNOSIS — I10 Essential (primary) hypertension: Secondary | ICD-10-CM | POA: Diagnosis not present

## 2015-07-07 DIAGNOSIS — I129 Hypertensive chronic kidney disease with stage 1 through stage 4 chronic kidney disease, or unspecified chronic kidney disease: Secondary | ICD-10-CM | POA: Diagnosis not present

## 2015-07-07 DIAGNOSIS — E875 Hyperkalemia: Secondary | ICD-10-CM

## 2015-07-07 DIAGNOSIS — F4321 Adjustment disorder with depressed mood: Secondary | ICD-10-CM | POA: Diagnosis not present

## 2015-07-07 DIAGNOSIS — I471 Supraventricular tachycardia: Secondary | ICD-10-CM | POA: Diagnosis not present

## 2015-07-07 DIAGNOSIS — E1121 Type 2 diabetes mellitus with diabetic nephropathy: Secondary | ICD-10-CM | POA: Insufficient documentation

## 2015-07-07 DIAGNOSIS — Z23 Encounter for immunization: Secondary | ICD-10-CM | POA: Diagnosis not present

## 2015-07-07 DIAGNOSIS — E1122 Type 2 diabetes mellitus with diabetic chronic kidney disease: Secondary | ICD-10-CM | POA: Diagnosis not present

## 2015-07-07 DIAGNOSIS — N183 Chronic kidney disease, stage 3 unspecified: Secondary | ICD-10-CM

## 2015-07-07 DIAGNOSIS — E119 Type 2 diabetes mellitus without complications: Secondary | ICD-10-CM

## 2015-07-07 DIAGNOSIS — IMO0001 Reserved for inherently not codable concepts without codable children: Secondary | ICD-10-CM

## 2015-07-07 LAB — POCT UA - MICROALBUMIN: MICROALBUMIN (UR) POC: NEGATIVE mg/L

## 2015-07-07 MED ORDER — ALPRAZOLAM 0.25 MG PO TABS: ORAL_TABLET | ORAL | Status: DC

## 2015-07-07 NOTE — Progress Notes (Signed)
Subjective:    Patient ID: Kristy [name], female    DOB: Jan 03, 1928, 81 y.o.   MRN: [id number]  HPI Pt here for follow up and management of chronic medical problems which includes diabetes and hypertension. She is taking medications regularly. She is accompanied today by her husband. The patient is doing well overall. She will get her flu shot today and we will review her lab work today. She has had some recent bouts with urinary tract infection and the most recent urine culture and sensitivity was clear. Recent lab work will be reviewed today and is as follows. The blood sugar is elevated at 119 but this is greatly improved from 4 months ago. The creatinine, the most important kidney function test is once again slightly elevated and we will continue to monitor this. Please remind the patient she should avoid all NSAIDs like ibuprofen and Aleve. The electrolytes including potassium have a potassium that is elevated at 5.7 and this is higher than it has been in the past. The patient should have a BMP repeated in one week nonfasting to follow-up on the potassium.++++++++ All liver function tests are within normal limits except one is slightly decreased. The CBC is stable and continues to have a slightly elevated white count which has been evaluated by the hematologist and no further treatment plans are recommended for this. The hemoglobin is good at 12.2 and the platelet count is adequate. The vitamin D level is good at 33.2 and she should continue with current treatment Notes Recorded by Chipper Herb, MD on 07/05/2015 at 2:49 PM The hemoglobin A1c is 7.1% and this is slightly elevated but good for a person of her age. This means that the average blood sugar has been under good control for the past 3 months          The patient is pleasant today and good spirits. She denies chest pain shortness of breath and trouble swallowing heartburn indigestion nausea vomiting diarrhea or blood in the stool.  She takes her Xanax as needed. She does have occasional indigestion and takes omeprazole when needed for this.        Patient Active Problem List   Diagnosis Date Noted  . SVT (supraventricular tachycardia) ([place]) 02/08/2015  . Depression 08/24/2013  . Essential hypertension, benign 06/08/2013  . Hyperlipidemia 06/08/2013  . Vitamin D deficiency 06/08/2013  . Diabetes mellitus type 2 controlled 06/08/2013  . OA (osteoarthritis) of knee 01/18/2013  . CLL (chronic lymphocytic leukemia) ([place]) 06/17/2012   Outpatient Encounter Prescriptions as of 07/07/2015  Medication Sig  . ADVOCATE LANCETS MISC Check Blood Sugar bid. Dx E11.9  . ALPRAZolam (XANAX) 0.25 MG tablet TAKE 1/2 TABLET BY MOUTH TWICE A DAY AS NEEDED OR 1 TABLET AT BEDTIME AS NEEDED  . aspirin EC 81 MG tablet Take 81 mg by mouth daily.  . cholecalciferol (VITAMIN D) 1000 UNITS tablet Take 1,000 Units by mouth daily.  Marland Kitchen esomeprazole (NEXIUM) 40 MG capsule Take 1 capsule (40 mg total) by mouth daily. (Patient taking differently: Take 40 mg by mouth as needed (reflux). )  . fenofibrate micronized (LOFIBRA) 134 MG capsule TAKE ONE CAPSULE BY MOUTH AT BEDTIME  . furosemide (LASIX) 40 MG tablet Take 40 mg by mouth 2 (two) times daily as needed for fluid.   Marland Kitchen glucose blood (FREESTYLE TEST STRIPS) test strip Check Blood sugar bid. Dx E11.9  . glyBURIDE-metformin (GLUCOVANCE) 5-500 MG tablet TAKE 2 TABLETS BY MOUTH TWICE DAILY  . hydroxypropyl methylcellulose (  ISOPTO TEARS) 2.5 % ophthalmic solution Place 1 drop into both eyes 2 (two) times daily as needed (dry eyes).  Marland Kitchen lisinopril (PRINIVIL,ZESTRIL) 10 MG tablet TAKE ONE HALF TABLET BY MOUTH EVERY DAY  . metoprolol succinate (TOPROL-XL) 25 MG 24 hr tablet TAKE ONE TABLET BY MOUTH ONE TIME DAILY  . Multiple Vitamins-Minerals (PRESERVISION AREDS 2 PO) Take by mouth.  . Omega-3 Fatty Acids (FISH OIL) 1000 MG CAPS Take 1 capsule by mouth daily.  . pravastatin (PRAVACHOL) 80 MG tablet TAKE  ONE TABLET BY MOUTH AT BEDTIME  . ZETIA 10 MG tablet TAKE ONE TABLET BY MOUTH EVERY DAY AS DIRECTED  . [DISCONTINUED] amoxicillin-clavulanate (AUGMENTIN) 875-125 MG per tablet Take 1 tablet by mouth 2 (two) times daily.  . [DISCONTINUED] Black Cohosh 40 MG CAPS Take 1 capsule by mouth 2 (two) times daily.  . [DISCONTINUED] sulfamethoxazole-trimethoprim (BACTRIM DS,SEPTRA DS) 800-160 MG per tablet Take 1 tablet by mouth 2 (two) times daily.  . [DISCONTINUED] sulfamethoxazole-trimethoprim (BACTRIM DS,SEPTRA DS) 800-160 MG per tablet Take 1 tablet by mouth 2 (two) times daily.   No facility-administered encounter medications on file as of 07/07/2015.     Review of Systems  Constitutional: Negative.   HENT: Negative.   Eyes: Negative.        Has macular degeneration now - Dr Zigmund Daniel  Respiratory: Negative.   Cardiovascular: Negative.   Gastrointestinal: Negative.   Endocrine: Negative.   Genitourinary: Negative.   Musculoskeletal: Negative.   Skin: Negative.   Allergic/Immunologic: Negative.   Neurological: Negative.   Hematological: Negative.   Psychiatric/Behavioral: Negative.        Objective:   Physical Exam  Constitutional: She is oriented to person, place, and time. She appears well-developed and well-nourished. No distress.  HENT:  Head: Normocephalic and atraumatic.  Right Ear: External ear normal.  Left Ear: External ear normal.  Nose: Nose normal.  Mouth/Throat: Oropharynx is clear and moist.  Eyes: Conjunctivae and EOM are normal. Pupils are equal, round, and reactive to light. Right eye exhibits no discharge. Left eye exhibits no discharge. No scleral icterus.  Neck: Normal range of motion. Neck supple. No JVD present. No thyromegaly present.  Cardiovascular: Normal rate, regular rhythm, normal heart sounds and intact distal pulses.   No murmur heard. At 72/m  Pulmonary/Chest: Effort normal and breath sounds normal. No respiratory distress. She has no wheezes. She  has no rales. She exhibits no tenderness.  Abdominal: Soft. Bowel sounds are normal. She exhibits no mass. There is no tenderness. There is no rebound and no guarding.  Obesity without masses tenderness or bruits  Musculoskeletal: She exhibits no edema or tenderness.  The patient's range of motion remains slightly has of that especially with going up on the exam table and coming off of the exam table because of knee surgeries.  Lymphadenopathy:    She has no cervical adenopathy.  Neurological: She is alert and oriented to person, place, and time. She has normal reflexes. No cranial nerve deficit.  Skin: Skin is warm and dry. No rash noted.  Psychiatric: She has a normal mood and affect. Her behavior is normal. Judgment and thought content normal.  Nursing note and vitals reviewed.   BP 123/83 mmHg  Pulse 71  Temp(Src) 97.4 F (36.3 C) (Oral)  Ht _0  (1.575 m)  Wt 169 lb (76.658 kg)  BMI 30.90 kg/m2       Assessment & Plan:  1. Type 2 diabetes mellitus without complication, without long-term current use of  insulin (HCC) -A1c was 7.1 and the patient is going to continue to try to keep this at this level or get it slightly lower.% - POCT UA - Microalbumin  2. CLL (chronic lymphocytic leukemia) ([place]) -The white blood cell count is stable on this patient if she has seen the oncologist/hematologist in the past and no changes will be entertained at this time  3. Essential hypertension, benign -The blood pressure is good today and she will continue with current treatment - BMP8+EGFR  4. SVT (supraventricular tachycardia) ([place]) -She has had no episodes of fast heart rate or lightheadedness  5. Type 2 diabetes mellitus with stage 3 chronic kidney disease, without long-term current use of insulin ([place]) -She will continue with aggressive therapeutic lifestyle changes and avoiding NSAIDs  6. Type 2 diabetes mellitus with chronic kidney disease and hypertension ([place]) -She will continue  with her current blood pressure medicine and therapeutic lifestyle changes  7. Serum potassium elevated -This will be rechecked today because of an elevated potassium - BMP8+EGFR  8. Adjustment disorder with depressed mood -Continue Xanax as needed and stay as active as possible and reduce caffeine intake as recommended  Meds ordered this encounter  Medications  . ALPRAZolam (XANAX) 0.25 MG tablet    Sig: TAKE 1/2 TABLET BY MOUTH TWICE A DAY AS NEEDED OR 1 TABLET AT BEDTIME AS NEEDED    Dispense:  90 tablet    Refill:  1   Patient Instructions                       Medicare Annual Wellness Visit  [facility] and the medical providers at [facility] strive to bring you the best medical care.  In doing so we not only want to address your current medical conditions and concerns but also to detect new conditions early and prevent illness, disease and health-related problems.    Medicare offers a yearly Wellness Visit which allows our clinical staff to assess your need for preventative services including immunizations, lifestyle education, counseling to decrease risk of preventable diseases and screening for fall risk and other medical concerns.    This visit is provided free of charge (no copay) for all Medicare recipients. The clinical pharmacists at [facility] have begun to conduct these Wellness Visits which will also include a thorough review of all your medications.    As you primary medical provider recommend that you make an appointment for your Annual Wellness Visit if you have not done so already this year.  You may set up this appointment before you leave today or you may call back ([phone]) and schedule an appointment.  Please make sure when you call that you mention that you are scheduling your Annual Wellness Visit with the clinical pharmacist so that the appointment may be made for the proper length of time.     Continue current  medications. Continue good therapeutic lifestyle changes which include good diet and exercise. Fall precautions discussed with patient. If an FOBT was given today- please return it to our front desk. If you are over 24 years old - you may need Prevnar 45 or the adult Pneumonia vaccine.  **Flu shots will be available soon--- please call and schedule a FLU-CLINIC appointment**  After your visit with [place] today you will receive a survey in the mail or online from [employer] regarding your care with [place]. Please take a moment to fill this out. Your feedback is very  important to [place] as you can help [place] better understand your patient needs as well as improve your experience and satisfaction. WE CARE ABOUT YOU!!!   Continue to check blood sugars at home Stop drinking [facility] is loaded with caffeine. It could be playing a role with your insomnia. Continue to check feet regularly and stay as active physically as possible without falling   Arrie Senate MD

## 2015-07-07 NOTE — Patient Instructions (Addendum)
Medicare Annual Wellness Visit  [facility] and the medical providers at [facility] strive to bring you the best medical care.  In doing so we not only want to address your current medical conditions and concerns but also to detect new conditions early and prevent illness, disease and health-related problems.    Medicare offers a yearly Wellness Visit which allows our clinical staff to assess your need for preventative services including immunizations, lifestyle education, counseling to decrease risk of preventable diseases and screening for fall risk and other medical concerns.    This visit is provided free of charge (no copay) for all Medicare recipients. The clinical pharmacists at [facility] have begun to conduct these Wellness Visits which will also include a thorough review of all your medications.    As you primary medical provider recommend that you make an appointment for your Annual Wellness Visit if you have not done so already this year.  You may set up this appointment before you leave today or you may call back ([phone]) and schedule an appointment.  Please make sure when you call that you mention that you are scheduling your Annual Wellness Visit with the clinical pharmacist so that the appointment may be made for the proper length of time.     Continue current medications. Continue good therapeutic lifestyle changes which include good diet and exercise. Fall precautions discussed with patient. If an FOBT was given today- please return it to our front desk. If you are over 57 years old - you may need Prevnar 46 or the adult Pneumonia vaccine.  **Flu shots will be available soon--- please call and schedule a FLU-CLINIC appointment**  After your visit with [place] today you will receive a survey in the mail or online from [employer] regarding your care with [place]. Please take a moment to fill this out. Your feedback is  very important to [place] as you can help [place] better understand your patient needs as well as improve your experience and satisfaction. WE CARE ABOUT YOU!!!   Continue to check blood sugars at home Stop drinking [facility] is loaded with caffeine. It could be playing a role with your insomnia. Continue to check feet regularly and stay as active physically as possible without falling

## 2015-07-08 LAB — BMP8+EGFR
BUN / CREAT RATIO: 11 (ref 11–26)
BUN: 13 mg/dL (ref 8–27)
CO2: 22 mmol/L (ref 18–29)
CREATININE: 1.15 mg/dL — AB (ref 0.57–1.00)
Calcium: 9.8 mg/dL (ref 8.7–10.3)
Chloride: 105 mmol/L (ref 97–108)
GFR, EST AFRICAN AMERICAN: 50 mL/min/{1.73_m2} — AB (ref >59)
GFR, EST NON AFRICAN AMERICAN: 44 mL/min/{1.73_m2} — AB (ref >59)
GLUCOSE: 140 mg/dL — AB (ref 65–99)
Potassium: 5 mmol/L (ref 3.5–5.2)
Sodium: 142 mmol/L (ref 134–144)

## 2015-07-31 DIAGNOSIS — Z1231 Encounter for screening mammogram for malignant neoplasm of breast: Secondary | ICD-10-CM | POA: Diagnosis not present

## 2015-07-31 LAB — HM MAMMOGRAPHY: HM Mammogram: NEGATIVE

## 2015-08-02 ENCOUNTER — Encounter: Payer: Self-pay | Admitting: *Deleted

## 2015-08-08 ENCOUNTER — Other Ambulatory Visit (INDEPENDENT_AMBULATORY_CARE_PROVIDER_SITE_OTHER): Payer: Medicare Other

## 2015-08-08 DIAGNOSIS — N390 Urinary tract infection, site not specified: Secondary | ICD-10-CM

## 2015-08-08 LAB — POCT UA - MICROSCOPIC ONLY
Casts, Ur, LPF, POC: NEGATIVE
Crystals, Ur, HPF, POC: NEGATIVE
MUCUS UA: NEGATIVE
Yeast, UA: NEGATIVE

## 2015-08-08 LAB — POCT URINALYSIS DIPSTICK
Bilirubin, UA: NEGATIVE
Blood, UA: NEGATIVE
KETONES UA: NEGATIVE
Leukocytes, UA: NEGATIVE
Nitrite, UA: NEGATIVE
PH UA: 5
PROTEIN UA: NEGATIVE
SPEC GRAV UA: 1.025
Urobilinogen, UA: NEGATIVE

## 2015-08-08 NOTE — Progress Notes (Signed)
Lab only 

## 2015-08-10 LAB — URINE CULTURE

## 2015-08-29 ENCOUNTER — Encounter (INDEPENDENT_AMBULATORY_CARE_PROVIDER_SITE_OTHER): Payer: Medicare Other | Admitting: Ophthalmology

## 2015-08-29 DIAGNOSIS — H353134 Nonexudative age-related macular degeneration, bilateral, advanced atrophic with subfoveal involvement: Secondary | ICD-10-CM | POA: Diagnosis not present

## 2015-08-29 DIAGNOSIS — H35033 Hypertensive retinopathy, bilateral: Secondary | ICD-10-CM

## 2015-08-29 DIAGNOSIS — I10 Essential (primary) hypertension: Secondary | ICD-10-CM

## 2015-08-29 DIAGNOSIS — H43813 Vitreous degeneration, bilateral: Secondary | ICD-10-CM

## 2015-09-08 DIAGNOSIS — Z961 Presence of intraocular lens: Secondary | ICD-10-CM | POA: Diagnosis not present

## 2015-09-08 DIAGNOSIS — H401290 Low-tension glaucoma, unspecified eye, stage unspecified: Secondary | ICD-10-CM | POA: Diagnosis not present

## 2015-09-08 DIAGNOSIS — H0289 Other specified disorders of eyelid: Secondary | ICD-10-CM | POA: Diagnosis not present

## 2015-09-13 ENCOUNTER — Other Ambulatory Visit: Payer: Self-pay | Admitting: Family Medicine

## 2015-09-18 ENCOUNTER — Encounter: Payer: Self-pay | Admitting: *Deleted

## 2015-09-18 ENCOUNTER — Other Ambulatory Visit: Payer: Self-pay | Admitting: Family Medicine

## 2015-09-19 ENCOUNTER — Other Ambulatory Visit: Payer: Self-pay | Admitting: Family Medicine

## 2015-09-19 NOTE — Telephone Encounter (Signed)
Done 09/07/15 for #90, shouldn't be due?

## 2015-09-27 ENCOUNTER — Ambulatory Visit (INDEPENDENT_AMBULATORY_CARE_PROVIDER_SITE_OTHER): Payer: Medicare Other | Admitting: Pediatrics

## 2015-09-27 ENCOUNTER — Encounter: Payer: Self-pay | Admitting: Pediatrics

## 2015-09-27 VITALS — BP 124/60 | HR 70 | Temp 97.3°F | Ht 62.0 in | Wt 176.2 lb

## 2015-09-27 DIAGNOSIS — M542 Cervicalgia: Secondary | ICD-10-CM | POA: Diagnosis not present

## 2015-09-27 MED ORDER — METHOCARBAMOL 500 MG PO TABS: 500.0000 mg | ORAL_TABLET | Freq: Two times a day (BID) | ORAL | Status: DC

## 2015-09-27 NOTE — Progress Notes (Signed)
Subjective:    Patient ID: Kristy [name], female    DOB: [birth year], 81 y.o.   MRN: [id number]  CC: Neck Pain   HPI: Kristy [name] is a 81 y.o. female presenting for Neck Pain  Started 6 days ago Thought it was a crick in her neck from sleeping but not going away Pain with ROM of neck Does not hurt if she holds head still Thinks at times R side feels more swollen compared with L of neck No recent URI symptoms No fevers No coughing, breathing has been fine No ear pain Has tried ibuprofen at home with some improvement in pain, taking 400mg  3-4 times a day   Depression screen [facility] 2/9 09/27/2015 07/07/2015 03/29/2015 03/07/2015 01/03/2015  Decreased Interest 0 0 0 0 0  Down, Depressed, Hopeless 0 0 0 0 0  PHQ - 2 Score 0 0 0 0 0     Relevant past medical, surgical, family and social history reviewed and updated as indicated. Interim medical history since our last visit reviewed. Allergies and medications reviewed and updated.    ROS: Per HPI unless specifically indicated above  History  Smoking status  . Never Smoker   Smokeless tobacco  . Never Used    Past Medical History Patient Active Problem List   Diagnosis Date Noted  . Type 2 diabetes mellitus with stage 3 chronic kidney disease ([place]) 07/07/2015  . Type 2 diabetes mellitus with chronic kidney disease and hypertension ([place]) 07/07/2015  . SVT (supraventricular tachycardia) ([place]) 02/08/2015  . Depression 08/24/2013  . Essential hypertension, benign 06/08/2013  . Hyperlipidemia 06/08/2013  . Vitamin D deficiency 06/08/2013  . Diabetes mellitus type 2 controlled 06/08/2013  . OA (osteoarthritis) of knee 01/18/2013  . CLL (chronic lymphocytic leukemia) ([place]) 06/17/2012        Objective:    BP 124/60 mmHg  Pulse 70  Temp(Src) 97.3 F (36.3 C) (Oral)  Ht 5\' 2"  (1.575 m)  Wt 176 lb 3.2 oz (79.924 kg)  BMI 32.22 kg/m2  Wt Readings from Last 3 Encounters:  09/27/15 176 lb 3.2 oz (79.924 kg)  07/07/15  169 lb (76.658 kg)  03/29/15 172 lb (78.019 kg)     Gen: NAD, alert, cooperative with exam, NCAT EYES: EOMI, no scleral injection or icterus ENT:  TMs pearly gray b/l, OP without erythema LYMPH: no cervical LAD CV: NRRR, normal S1/S2, no murmur, distal pulses 2+ b/l Resp: CTABL, no wheezes, normal WOB Ext: No edema, warm Neuro: Alert and oriented, strength equal b/l UE and LE, coordination grossly normal MSK: Decreased ROM of neck with turning head to R, limited by pain in ant neck muscle, better ROM with L, limited head tilt to both L and R shoulder due to pain in R side of neck. No redness or erythema, no masses palpated. R collar bone more protruberant compared with L.    Assessment & Plan:    Kristy [name] was seen today for neck pain, likely due to tight muscle in neck. Start ROM exercises as discussed, try heating pad, continue low dose NSAIDs, try muscle relaxant not with other meds that make her sleepy. RTC if worsening or not improving.  Diagnoses and all orders for this visit:  Neck pain -     methocarbamol (ROBAXIN) 500 MG tablet; Take 1 tablet (500 mg total) by mouth 2 (two) times daily.  Patient Instructions  Heating pad for 5- 10 min 3-4 times a day  Gentle neck range of motion stretches/exercises  like we did in clinic -ear to each shoulder -turn head to left then right -chin to chest  Try muscle relaxer, start with half a tab. Can take twice a day. Dont take with other medicines that make you sleepy  Follow up plan: Return in about 2 weeks (around 10/11/2015), or if symptoms worsen or fail to improve.  Assunta Found, MD [facility] 09/27/2015, 5:29 PM

## 2015-09-27 NOTE — Patient Instructions (Signed)
Heating pad for 5- 10 min 3-4 times a day  Gentle neck range of motion stretches/exercises like we did in clinic -ear to each shoulder -turn head to left then right -chin to chest  Try muscle relaxer, start with half a tab. Can take twice a day. Dont take with other medicines that make you sleepy

## 2015-09-28 ENCOUNTER — Ambulatory Visit (INDEPENDENT_AMBULATORY_CARE_PROVIDER_SITE_OTHER): Payer: Medicare Other | Admitting: Nurse Practitioner

## 2015-09-28 VITALS — BP 149/68 | HR 69 | Temp 97.1°F | Ht 62.0 in | Wt 176.0 lb

## 2015-09-28 DIAGNOSIS — M542 Cervicalgia: Secondary | ICD-10-CM

## 2015-09-28 MED ORDER — KETOROLAC TROMETHAMINE 30 MG/ML IJ SOLN
30.0000 mg | Freq: Once | INTRAMUSCULAR | Status: AC
  Administered 2015-09-28: 30 mg via INTRAMUSCULAR

## 2015-09-28 NOTE — Progress Notes (Signed)
   Subjective:    Patient ID: Kristy [name], female    DOB: 1927-11-04, 81 y.o.   MRN: [id number]  HPI Patient was seen yesterday with neck pain- now it is swollen on right side and hurts. SHe denies fever bur says that she just does not feel good.    Review of Systems  Constitutional: Negative.   HENT: Negative.   Respiratory: Negative.   Cardiovascular: Negative.   Genitourinary: Negative.   Neurological: Negative.   Psychiatric/Behavioral: Negative.   All other systems reviewed and are negative.      Objective:   Physical Exam  Constitutional: She is oriented to person, place, and time. She appears well-developed and well-nourished.  Cardiovascular: Normal rate, regular rhythm and normal heart sounds.   Pulmonary/Chest: Effort normal and breath sounds normal.  Musculoskeletal:  Tenderness along all neck muscles on right- decreased ROM with pin on rotation and lateral movement.   Neurological: She is alert and oriented to person, place, and time.  Skin: Skin is warm.  Psychiatric: She has a normal mood and affect. Her behavior is normal. Judgment and thought content normal.   BP 149/68 mmHg  Pulse 69  Temp(Src) 97.1 F (36.2 C) (Oral)  Ht 5\' 2"  (1.575 m)  Wt 176 lb (79.833 kg)  BMI 32.18 kg/m2       Assessment & Plan:  1. Neck pain Continue muscle relaxer as rx Moist heat'RTO prn - ketorolac (TORADOL) 30 MG/ML injection 30 mg; Inject 1 mL (30 mg total) into the muscle once.  Mary-Margaret Hassell Done, FNP

## 2015-09-28 NOTE — Patient Instructions (Signed)
Acute Torticollis °Torticollis is a condition in which the muscles of the neck tighten (contract) abnormally, causing the neck to twist and the head to move into an unnatural position. Torticollis that develops suddenly is called acute torticollis. If torticollis becomes chronic and is left untreated, the face and neck can become deformed. °CAUSES °This condition may be caused by: °· Sleeping in an awkward position (common). °· Extending or twisting the neck muscles beyond their normal position. °· Infection. °In some cases, the cause may not be known. °SYMPTOMS °Symptoms of this condition include: °· An unnatural position of the head. °· Neck pain. °· A limited ability to move the neck. °· Twisting of the neck to one side. °DIAGNOSIS °This condition is diagnosed with a physical exam. You may also have imaging tests, such as an X-ray, CT scan, or MRI. °TREATMENT °Treatment for this condition involves trying to relax the neck muscles. It may include: °· Medicines or shots. °· Physical therapy. °· Surgery. This may be done in severe cases. °HOME CARE INSTRUCTIONS °· Take medicines only as directed by your health care provider. °· Do stretching exercises and massage your neck as directed by your health care provider. °· Keep all follow-up visits as directed by your health care provider. This is important. °SEEK MEDICAL CARE IF: °· You develop a fever. °SEEK IMMEDIATE MEDICAL CARE IF: °· You develop difficulty breathing. °· You develop noisy breathing (stridor). °· You start drooling. °· You have trouble swallowing or have pain with swallowing. °· You develop numbness or weakness in your hands or feet. °· You have changes in your speech, understanding, or vision. °· Your pain gets worse. °  °This information is not intended to replace advice given to you by your health care provider. Make sure you discuss any questions you have with your health care provider. °  °Document Released: 09/13/2000 Document Revised:  01/31/2015 Document Reviewed: 09/12/2014 °Elsevier Interactive Patient Education ©2016 [employer] ° °

## 2015-10-03 ENCOUNTER — Other Ambulatory Visit: Payer: Self-pay | Admitting: Family Medicine

## 2015-10-03 ENCOUNTER — Ambulatory Visit (INDEPENDENT_AMBULATORY_CARE_PROVIDER_SITE_OTHER): Payer: Medicare Other

## 2015-10-03 ENCOUNTER — Encounter: Payer: Self-pay | Admitting: Family Medicine

## 2015-10-03 ENCOUNTER — Ambulatory Visit (INDEPENDENT_AMBULATORY_CARE_PROVIDER_SITE_OTHER): Payer: Medicare Other | Admitting: Family Medicine

## 2015-10-03 ENCOUNTER — Ambulatory Visit (HOSPITAL_COMMUNITY)
Admission: RE | Admit: 2015-10-03 | Discharge: 2015-10-03 | Disposition: A | Discharge status: Home / Self Care | Payer: Medicare Other | Source: Ambulatory Visit | Attending: Family Medicine | Admitting: Family Medicine

## 2015-10-03 VITALS — BP 131/66 | HR 73 | Temp 97.8°F | Ht 62.0 in | Wt 176.0 lb

## 2015-10-03 DIAGNOSIS — M542 Cervicalgia: Secondary | ICD-10-CM | POA: Diagnosis not present

## 2015-10-03 DIAGNOSIS — M50221 Other cervical disc displacement at C4-C5 level: Secondary | ICD-10-CM | POA: Insufficient documentation

## 2015-10-03 DIAGNOSIS — E119 Type 2 diabetes mellitus without complications: Secondary | ICD-10-CM | POA: Diagnosis not present

## 2015-10-03 DIAGNOSIS — R52 Pain, unspecified: Secondary | ICD-10-CM

## 2015-10-03 DIAGNOSIS — I10 Essential (primary) hypertension: Secondary | ICD-10-CM

## 2015-10-03 DIAGNOSIS — M5021 Other cervical disc displacement,  high cervical region: Secondary | ICD-10-CM | POA: Insufficient documentation

## 2015-10-03 DIAGNOSIS — M2578 Osteophyte, vertebrae: Secondary | ICD-10-CM | POA: Insufficient documentation

## 2015-10-03 DIAGNOSIS — M47812 Spondylosis without myelopathy or radiculopathy, cervical region: Secondary | ICD-10-CM | POA: Insufficient documentation

## 2015-10-03 DIAGNOSIS — M47814 Spondylosis without myelopathy or radiculopathy, thoracic region: Secondary | ICD-10-CM | POA: Diagnosis not present

## 2015-10-03 DIAGNOSIS — M5010 Cervical disc disorder with radiculopathy, unspecified cervical region: Secondary | ICD-10-CM

## 2015-10-03 DIAGNOSIS — C9110 Chronic lymphocytic leukemia of B-cell type not having achieved remission: Secondary | ICD-10-CM

## 2015-10-03 NOTE — Patient Instructions (Signed)
Continue to use warm wet compresses to the neck for 20 minutes 5 or 6 times daily Continue ibuprofen but no more than 200 mg after meals and at bedtime after a snack Continue current pain medicine and use a muscle relaxer as needed

## 2015-10-03 NOTE — Progress Notes (Signed)
Subjective:    Patient ID: Kristy [name], female    DOB: July 19, 1928, 82 y.o.   MRN: [id number]  HPI Patient here today for neck pain that started 1 week ago. There has been no known injury. She has been seen twice in the office. She's been taking muscle relaxants anti-inflammatory medicines and pain pills from a previous surgery that she's had and she is still in severe pain. The pain radiates to her right shoulder. The pain radiates also to the right side of the neck. The pain is severe. It hurts to turn her head to the right. She's been using warm wet compresses.      Patient Active Problem List   Diagnosis Date Noted  . Type 2 diabetes mellitus with stage 3 chronic kidney disease ([place]) 07/07/2015  . Type 2 diabetes mellitus with chronic kidney disease and hypertension ([place]) 07/07/2015  . SVT (supraventricular tachycardia) ([place]) 02/08/2015  . Depression 08/24/2013  . Essential hypertension, benign 06/08/2013  . Hyperlipidemia 06/08/2013  . Vitamin D deficiency 06/08/2013  . Diabetes mellitus type 2 controlled 06/08/2013  . OA (osteoarthritis) of knee 01/18/2013  . CLL (chronic lymphocytic leukemia) ([place]) 06/17/2012   Outpatient Encounter Prescriptions as of 10/03/2015  Medication Sig  . ADVOCATE LANCETS MISC Check Blood Sugar bid. Dx E11.9  . ALPRAZolam (XANAX) 0.25 MG tablet TAKE 1/2 TABLET TWICE DAILY AS NEEDED OR 1 TABLET AT BEDTIME  . aspirin EC 81 MG tablet Take 81 mg by mouth daily.  . cholecalciferol (VITAMIN D) 1000 UNITS tablet Take 1,000 Units by mouth daily.  Marland Kitchen esomeprazole (NEXIUM) 40 MG capsule Take 1 capsule (40 mg total) by mouth daily. (Patient taking differently: Take 40 mg by mouth as needed (reflux). )  . fenofibrate micronized (LOFIBRA) 134 MG capsule TAKE ONE CAPSULE BY MOUTH AT BEDTIME  . furosemide (LASIX) 40 MG tablet Take 40 mg by mouth 2 (two) times daily as needed for fluid.   Marland Kitchen glucose blood (FREESTYLE TEST STRIPS) test strip Check Blood sugar bid. Dx  E11.9  . glyBURIDE-metformin (GLUCOVANCE) 5-500 MG tablet TAKE 2 TABLETS BY MOUTH TWICE DAILY  . hydroxypropyl methylcellulose (ISOPTO TEARS) 2.5 % ophthalmic solution Place 1 drop into both eyes 2 (two) times daily as needed (dry eyes).  Marland Kitchen lisinopril (PRINIVIL,ZESTRIL) 10 MG tablet TAKE ONE HALF TABLET BY MOUTH EVERY DAY  . methocarbamol (ROBAXIN) 500 MG tablet Take 1 tablet (500 mg total) by mouth 2 (two) times daily.  . metoprolol succinate (TOPROL-XL) 25 MG 24 hr tablet TAKE ONE TABLET BY MOUTH ONE TIME DAILY  . Multiple Vitamins-Minerals (PRESERVISION AREDS 2 PO) Take by mouth.  . Omega-3 Fatty Acids (FISH OIL) 1000 MG CAPS Take 1 capsule by mouth daily.  . pravastatin (PRAVACHOL) 80 MG tablet TAKE ONE TABLET BY MOUTH AT BEDTIME  . ZETIA 10 MG tablet TAKE ONE TABLET BY MOUTH EVERY DAY AS DIRECTED   No facility-administered encounter medications on file as of 10/03/2015.      Review of Systems  Constitutional: Negative.   HENT: Negative.   Eyes: Negative.   Respiratory: Negative.   Cardiovascular: Negative.   Gastrointestinal: Negative.   Endocrine: Negative.   Genitourinary: Negative.   Musculoskeletal: Positive for neck pain (x 1 week).  Skin: Negative.   Allergic/Immunologic: Negative.   Neurological: Negative.   Hematological: Negative.   Psychiatric/Behavioral: Negative.        Objective:   Physical Exam  Constitutional: She is oriented to person, place, and time. She appears well-developed  and well-nourished. She appears distressed.  HENT:  Head: Normocephalic.  Mouth/Throat: Oropharynx is clear and moist.  Eyes: Conjunctivae and EOM are normal. Pupils are equal, round, and reactive to light. Right eye exhibits no discharge. Left eye exhibits no discharge. No scleral icterus.  Neck: Normal range of motion. Neck supple. No thyromegaly present.  There is pain in the neck with moving her neck from left to right. There is no rash.  Pulmonary/Chest: Effort normal.    Musculoskeletal: She exhibits tenderness. She exhibits no edema.  There is limited range of motion of the neck due to the pain worse with turning her head to the right.  Lymphadenopathy:    She has no cervical adenopathy.  Neurological: She is alert and oriented to person, place, and time. She has normal reflexes. No cranial nerve deficit.  Skin: Skin is warm and dry. No rash noted.  Psychiatric: She has a normal mood and affect. Her behavior is normal. Judgment and thought content normal.  Nursing note and vitals reviewed.  BP 131/66 mmHg  Pulse 73  Temp(Src) 97.8 F (36.6 C) (Oral)  Ht 5\' 2"  (1.575 m)  Wt 176 lb (79.833 kg)  BMI 32.18 kg/m2  [facility] reading (PRIMARY) by  Dr. Kathee Polite spine--degenerative changes                                        Assessment & Plan:  1. Neck pain -The patient will continue her narcotic pain medicine that she has held over from the past and will reduce her ibuprofen to no more than 200 mg after meals and at bedtime pending results of BMP -Because of the findings on x-ray she will be scheduled for an MRI due to the severe pain that she is having and no relief with medication she is taking  2. Type 2 diabetes mellitus without complication, without long-term current use of insulin (HCC) -Jeannie to monitor blood sugars closely  3. CLL (chronic lymphocytic leukemia) ([place]) -Patient has a history of this and has been followed by [place] and the hematologist  4. Essential hypertension, benign -The blood pressure stable today    5. Cervical disc disorder with radiculopathy of cervical region -X-rays revealed severe degenerative disease and multiple disc spaces with foraminal narrowing and patient was informed of this  6. Degenerative arthritis of cervical spine -As above an MRI will be scheduled as soon as possible since treatment options have been exhausted as far as pain pills anti-inflammatory medicines  Patient Instructions  Continue to  use warm wet compresses to the neck for 20 minutes 5 or 6 times daily Continue ibuprofen but no more than 200 mg after meals and at bedtime after a snack Continue current pain medicine and use a muscle relaxer as needed   Arrie Senate MD

## 2015-10-04 ENCOUNTER — Telehealth: Payer: Self-pay | Admitting: Family Medicine

## 2015-10-04 LAB — BMP8+EGFR
BUN/Creatinine Ratio: 17 (ref 11–26)
BUN: 15 mg/dL (ref 8–27)
CO2: 23 mmol/L (ref 18–29)
Calcium: 9.9 mg/dL (ref 8.7–10.3)
Chloride: 98 mmol/L (ref 96–106)
Creatinine, Ser: 0.89 mg/dL (ref 0.57–1.00)
GFR, EST AFRICAN AMERICAN: 68 mL/min/{1.73_m2} (ref >59)
GFR, EST NON AFRICAN AMERICAN: 59 mL/min/{1.73_m2} — AB (ref >59)
Glucose: 303 mg/dL — ABNORMAL HIGH (ref 65–99)
POTASSIUM: 4.6 mmol/L (ref 3.5–5.2)
SODIUM: 137 mmol/L (ref 134–144)

## 2015-10-04 NOTE — Telephone Encounter (Signed)
Aware of MRI results and referral to neurosurgeon done.

## 2015-10-04 NOTE — Addendum Note (Signed)
Addended by: Shelbie Ammons on: 10/04/2015 03:46 PM   Modules accepted: Orders

## 2015-10-05 ENCOUNTER — Telehealth: Payer: Self-pay | Admitting: Family Medicine

## 2015-10-05 NOTE — Telephone Encounter (Signed)
Spoke with husband and results given

## 2015-10-13 ENCOUNTER — Telehealth: Payer: Self-pay | Admitting: Family Medicine

## 2015-10-13 DIAGNOSIS — M542 Cervicalgia: Secondary | ICD-10-CM

## 2015-10-13 MED ORDER — METHOCARBAMOL 500 MG PO TABS: 500.0000 mg | ORAL_TABLET | Freq: Two times a day (BID) | ORAL | Status: DC

## 2015-10-13 NOTE — Telephone Encounter (Signed)
Pt med sent to [facility] at pt home

## 2015-10-18 ENCOUNTER — Telehealth: Payer: Self-pay | Admitting: Cardiology

## 2015-10-18 ENCOUNTER — Encounter (INDEPENDENT_AMBULATORY_CARE_PROVIDER_SITE_OTHER): Payer: Medicare Other | Admitting: Ophthalmology

## 2015-10-19 ENCOUNTER — Telehealth: Payer: Self-pay

## 2015-10-19 NOTE — Telephone Encounter (Signed)
Take fenofibrate 160 one daily

## 2015-10-19 NOTE — Telephone Encounter (Signed)
Insurance denied prior authorization for Fenofibrate 134   Must try and fail Gemfibrozil, Fenofibrate 160 or Fenofibrate 54

## 2015-10-23 ENCOUNTER — Telehealth: Payer: Self-pay | Admitting: Family Medicine

## 2015-10-23 NOTE — Telephone Encounter (Signed)
Pt has had all these in the past:  Oxycodone 5 mg Tramadol 50 Hydrocodone 5-325  She just needs something to get her through until she see Dr Vertell Limber in a few weeks.  Which would you want her to have? She rarely uses them I will print and you can sign on Wednesday.

## 2015-10-23 NOTE — Telephone Encounter (Signed)
Continue oxycodone 5/325, #20, 1 twice daily if needed for severe pain

## 2015-10-25 MED ORDER — OXYCODONE-ACETAMINOPHEN 5-325 MG PO TABS: 1.0000 | ORAL_TABLET | Freq: Two times a day (BID) | ORAL | Status: DC | PRN

## 2015-10-31 ENCOUNTER — Other Ambulatory Visit: Payer: Self-pay

## 2015-10-31 NOTE — Telephone Encounter (Signed)
Please have clinical pharmacy or the individual components that the insurance is requesting based on her current strengths and we will okay that.

## 2015-10-31 NOTE — Telephone Encounter (Signed)
Insurance denied prior authorization for Glyburide-Metformin because this is not a covered drug  Must first try combination of metformin (metformin, generic Glucophage XR, Riomet) and sulfonylurea (glimepiride, Glipizide-metformin

## 2015-11-01 MED ORDER — GLIMEPIRIDE 4 MG PO TABS: 4.0000 mg | ORAL_TABLET | Freq: Two times a day (BID) | ORAL | Status: DC

## 2015-11-01 MED ORDER — METFORMIN HCL 1000 MG PO TABS: 1000.0000 mg | ORAL_TABLET | Freq: Two times a day (BID) | ORAL | Status: DC

## 2015-11-01 NOTE — Telephone Encounter (Signed)
rx should have been sent to [facility].  I called them to transfer.  Patient was notified of change Has follow up with PCP in last this month

## 2015-11-01 NOTE — Telephone Encounter (Signed)
x

## 2015-11-01 NOTE — Telephone Encounter (Signed)
Recommend change from glyburide/metformin combo as glyburide is not preferred sulfonylurea in patient's over 80yo.  Recommend change to glimepiride 4mg  take 1 tablet bid with breakfast and supper + metformin 1000mg  take 1 tablet twice a day with food.

## 2015-11-08 ENCOUNTER — Other Ambulatory Visit: Payer: Medicare Other

## 2015-11-08 DIAGNOSIS — R300 Dysuria: Secondary | ICD-10-CM

## 2015-11-08 NOTE — Progress Notes (Signed)
Lab only 

## 2015-11-09 ENCOUNTER — Telehealth: Payer: Self-pay | Admitting: Family Medicine

## 2015-11-09 ENCOUNTER — Other Ambulatory Visit (INDEPENDENT_AMBULATORY_CARE_PROVIDER_SITE_OTHER): Payer: Medicare Other

## 2015-11-09 DIAGNOSIS — R300 Dysuria: Secondary | ICD-10-CM

## 2015-11-09 LAB — POCT UA - MICROSCOPIC ONLY
BACTERIA, U MICROSCOPIC: NEGATIVE
Casts, Ur, LPF, POC: NEGATIVE
Crystals, Ur, HPF, POC: NEGATIVE
Mucus, UA: NEGATIVE
RBC, URINE, MICROSCOPIC: NEGATIVE
Yeast, UA: NEGATIVE

## 2015-11-09 LAB — POCT URINALYSIS DIPSTICK
BILIRUBIN UA: NEGATIVE
Blood, UA: NEGATIVE
Glucose, UA: NEGATIVE
Ketones, UA: NEGATIVE
LEUKOCYTES UA: NEGATIVE
NITRITE UA: NEGATIVE
PH UA: 5
PROTEIN UA: NEGATIVE
Spec Grav, UA: >=1.03
Urobilinogen, UA: NEGATIVE

## 2015-11-09 NOTE — Telephone Encounter (Signed)
Patient is complaining with frequent urination, and has a foul odor. Please advise and route to pool A since I am leaving early.

## 2015-11-09 NOTE — Telephone Encounter (Signed)
We need to check a urinalysis and get a culture and sensitivity

## 2015-11-09 NOTE — Telephone Encounter (Signed)
Patient brought a urine yesterday and a culture was sent out. Patient will come by a leave another urine for in house urine.

## 2015-11-10 LAB — URINE CULTURE

## 2015-11-10 MED ORDER — SULFAMETHOXAZOLE-TRIMETHOPRIM 800-160 MG PO TABS: 1.0000 | ORAL_TABLET | Freq: Two times a day (BID) | ORAL | Status: DC

## 2015-11-10 NOTE — Telephone Encounter (Signed)
error 

## 2015-11-20 ENCOUNTER — Other Ambulatory Visit (INDEPENDENT_AMBULATORY_CARE_PROVIDER_SITE_OTHER): Payer: Medicare Other

## 2015-11-20 DIAGNOSIS — E559 Vitamin D deficiency, unspecified: Secondary | ICD-10-CM

## 2015-11-20 DIAGNOSIS — I10 Essential (primary) hypertension: Secondary | ICD-10-CM

## 2015-11-20 DIAGNOSIS — E119 Type 2 diabetes mellitus without complications: Secondary | ICD-10-CM

## 2015-11-20 DIAGNOSIS — C9110 Chronic lymphocytic leukemia of B-cell type not having achieved remission: Secondary | ICD-10-CM | POA: Diagnosis not present

## 2015-11-20 LAB — POCT GLYCOSYLATED HEMOGLOBIN (HGB A1C): Hemoglobin A1C: 7.7

## 2015-11-21 LAB — BMP8+EGFR
BUN / CREAT RATIO: 17 (ref 11–26)
BUN: 17 mg/dL (ref 8–27)
CALCIUM: 10.4 mg/dL — AB (ref 8.7–10.3)
CHLORIDE: 102 mmol/L (ref 96–106)
CO2: 21 mmol/L (ref 18–29)
Creatinine, Ser: 1.01 mg/dL — ABNORMAL HIGH (ref 0.57–1.00)
GFR calc Af Amer: 59 mL/min/{1.73_m2} — ABNORMAL LOW (ref >59)
GFR calc non Af Amer: 51 mL/min/{1.73_m2} — ABNORMAL LOW (ref >59)
GLUCOSE: 188 mg/dL — AB (ref 65–99)
Potassium: 5.4 mmol/L — ABNORMAL HIGH (ref 3.5–5.2)
Sodium: 141 mmol/L (ref 134–144)

## 2015-11-21 LAB — CBC WITH DIFFERENTIAL/PLATELET
BASOS ABS: 0 10*3/uL (ref 0.0–0.2)
Basos: 0 %
EOS (ABSOLUTE): 0.2 10*3/uL (ref 0.0–0.4)
EOS: 2 %
HEMATOCRIT: 38.9 % (ref 34.0–46.6)
HEMOGLOBIN: 12.6 g/dL (ref 11.1–15.9)
IMMATURE GRANS (ABS): 0 10*3/uL (ref 0.0–0.1)
IMMATURE GRANULOCYTES: 0 %
LYMPHS ABS: 7.4 10*3/uL — AB (ref 0.7–3.1)
LYMPHS: 49 %
MCH: 29 pg (ref 26.6–33.0)
MCHC: 32.4 g/dL (ref 31.5–35.7)
MCV: 90 fL (ref 79–97)
MONOCYTES: 7 %
Monocytes Absolute: 1 10*3/uL — ABNORMAL HIGH (ref 0.1–0.9)
NEUTROS PCT: 42 %
Neutrophils Absolute: 6.4 10*3/uL (ref 1.4–7.0)
Platelets: 218 10*3/uL (ref 150–379)
RBC: 4.34 x10E6/uL (ref 3.77–5.28)
RDW: 13.8 % (ref 12.3–15.4)
WBC: 15.1 10*3/uL — AB (ref 3.4–10.8)

## 2015-11-21 LAB — NMR, LIPOPROFILE
Cholesterol: 135 mg/dL (ref 100–199)
HDL Cholesterol by NMR: 59 mg/dL (ref >39)
HDL PARTICLE NUMBER: 51.3 umol/L (ref >=30.5)
LDL Particle Number: 797 nmol/L (ref <1000)
LDL SIZE: 19.9 nm (ref >20.5)
LDL-C: 49 mg/dL (ref 0–99)
LP-IR SCORE: 57 — AB (ref <=45)
SMALL LDL PARTICLE NUMBER: 496 nmol/L (ref <=527)
Triglycerides by NMR: 136 mg/dL (ref 0–149)

## 2015-11-21 LAB — HEPATIC FUNCTION PANEL
ALBUMIN: 4.4 g/dL (ref 3.5–4.7)
ALK PHOS: 40 IU/L (ref 39–117)
ALT: 12 IU/L (ref 0–32)
AST: 14 IU/L (ref 0–40)
Bilirubin Total: 0.3 mg/dL (ref 0.0–1.2)
Bilirubin, Direct: 0.17 mg/dL (ref 0.00–0.40)
Total Protein: 6.5 g/dL (ref 6.0–8.5)

## 2015-11-21 LAB — VITAMIN D 25 HYDROXY (VIT D DEFICIENCY, FRACTURES): VIT D 25 HYDROXY: 33.3 ng/mL (ref 30.0–100.0)

## 2015-11-22 ENCOUNTER — Telehealth: Payer: Self-pay | Admitting: Family Medicine

## 2015-11-24 ENCOUNTER — Ambulatory Visit (INDEPENDENT_AMBULATORY_CARE_PROVIDER_SITE_OTHER): Payer: Medicare Other | Admitting: Family Medicine

## 2015-11-24 ENCOUNTER — Encounter: Payer: Self-pay | Admitting: Family Medicine

## 2015-11-24 VITALS — BP 116/74 | HR 102 | Temp 97.1°F | Ht 62.0 in | Wt 172.0 lb

## 2015-11-24 DIAGNOSIS — I10 Essential (primary) hypertension: Secondary | ICD-10-CM | POA: Diagnosis not present

## 2015-11-24 DIAGNOSIS — N183 Chronic kidney disease, stage 3 unspecified: Secondary | ICD-10-CM

## 2015-11-24 DIAGNOSIS — E1122 Type 2 diabetes mellitus with diabetic chronic kidney disease: Secondary | ICD-10-CM | POA: Diagnosis not present

## 2015-11-24 DIAGNOSIS — E1169 Type 2 diabetes mellitus with other specified complication: Secondary | ICD-10-CM | POA: Diagnosis not present

## 2015-11-24 DIAGNOSIS — M47812 Spondylosis without myelopathy or radiculopathy, cervical region: Secondary | ICD-10-CM

## 2015-11-24 DIAGNOSIS — IMO0001 Reserved for inherently not codable concepts without codable children: Secondary | ICD-10-CM

## 2015-11-24 DIAGNOSIS — M542 Cervicalgia: Secondary | ICD-10-CM

## 2015-11-24 DIAGNOSIS — R300 Dysuria: Secondary | ICD-10-CM | POA: Diagnosis not present

## 2015-11-24 DIAGNOSIS — M25551 Pain in right hip: Secondary | ICD-10-CM | POA: Diagnosis not present

## 2015-11-24 DIAGNOSIS — E785 Hyperlipidemia, unspecified: Secondary | ICD-10-CM | POA: Diagnosis not present

## 2015-11-24 DIAGNOSIS — I129 Hypertensive chronic kidney disease with stage 1 through stage 4 chronic kidney disease, or unspecified chronic kidney disease: Secondary | ICD-10-CM

## 2015-11-24 DIAGNOSIS — C9110 Chronic lymphocytic leukemia of B-cell type not having achieved remission: Secondary | ICD-10-CM | POA: Diagnosis not present

## 2015-11-24 DIAGNOSIS — E559 Vitamin D deficiency, unspecified: Secondary | ICD-10-CM

## 2015-11-24 DIAGNOSIS — E875 Hyperkalemia: Secondary | ICD-10-CM

## 2015-11-24 DIAGNOSIS — N189 Chronic kidney disease, unspecified: Secondary | ICD-10-CM

## 2015-11-24 LAB — POCT UA - MICROSCOPIC ONLY
CASTS, UR, LPF, POC: NEGATIVE
CRYSTALS, UR, HPF, POC: NEGATIVE
Mucus, UA: NEGATIVE
YEAST UA: NEGATIVE

## 2015-11-24 LAB — POCT URINALYSIS DIPSTICK
BILIRUBIN UA: NEGATIVE
Blood, UA: NEGATIVE
Leukocytes, UA: NEGATIVE
Nitrite, UA: NEGATIVE
PH UA: 6
PROTEIN UA: NEGATIVE
SPEC GRAV UA: 1.025
Urobilinogen, UA: NEGATIVE

## 2015-11-24 NOTE — Progress Notes (Signed)
Subjective:    Patient ID: Kristy [name], female    DOB: [birth year], 82 y.o.   MRN: [id number]  HPI Patient is here today for a 4 month follow up of chronic medical problems. She states that since starting Robaxin she has had hot flashes. Recent lab work will be reviewed with the patient and the results are as follows;The hemoglobin A1c is too high. Please schedule this patient for an appointment with the clinical pharmacist to make an attempt to get a blood sugar under better control with medication and diet The BMP has a blood sugar that is elevated at 188. The creatinine, the most important kidney function test remains slightly elevated. The electrolytes have a potassium that is slightly elevated and a serum calcium is slightly elevated and the should be repeated at the patient's next visit.+++++++++++ The CBC is within normal limits except she does have the elevated white blood cell count for which she is seen the hematologist and no further recommendations will be made regarding this. The hemoglobin is good and the platelet count is adequate. All liver function tests are within normal limits All cholesterol numbers with advanced lipid testing are excellent and at goal the patient should continue with current treatment. The vitamin D level is at the low end of the normal range but consistent with readings from 4 months ago. She should continue with current treatment  The patient denies any chest pain or shortness of breath. She says she is swallowing without problems and has no heartburn indigestion nausea vomiting diarrhea blood in the stool or black tarry bowel movements. She is passing her water without problems. She is getting a urine checked but she says she is asymptomatic with this. Currently she is doing well with her knees and the knee replacement that she has had. This morning specifically she's had more hip pain on the right. Is not been any falls or injuries. She is very stressed  right now because her husband has had recent shoulder surgery and its been difficult for her to deal with him not being as active as he used to be.  Review of Systems  Constitutional: Negative.   HENT: Negative.   Eyes: Negative.   Respiratory: Negative.   Cardiovascular: Negative.   Gastrointestinal: Negative.   Endocrine:       Hot Flashes  Genitourinary: Negative.   Musculoskeletal: Negative.   Skin: Negative.   Allergic/Immunologic: Negative.   Neurological: Negative.   Hematological: Negative.   Psychiatric/Behavioral: Negative.         Patient Active Problem List   Diagnosis Date Noted  . Type 2 diabetes mellitus with stage 3 chronic kidney disease ([place]) 07/07/2015  . Type 2 diabetes mellitus with chronic kidney disease and hypertension ([place]) 07/07/2015  . SVT (supraventricular tachycardia) ([place]) 02/08/2015  . Depression 08/24/2013  . Essential hypertension, benign 06/08/2013  . Hyperlipidemia 06/08/2013  . Vitamin D deficiency 06/08/2013  . Diabetes mellitus type 2 controlled 06/08/2013  . OA (osteoarthritis) of knee 01/18/2013  . CLL (chronic lymphocytic leukemia) ([place]) 06/17/2012   Outpatient Encounter Prescriptions as of 11/24/2015  Medication Sig  . ADVOCATE LANCETS MISC Check Blood Sugar bid. Dx E11.9  . ALPRAZolam (XANAX) 0.25 MG tablet TAKE 1/2 TABLET TWICE DAILY AS NEEDED OR 1 TABLET AT BEDTIME  . aspirin EC 81 MG tablet Take 81 mg by mouth daily.  . cholecalciferol (VITAMIN D) 1000 UNITS tablet Take 1,000 Units by mouth daily.  Marland Kitchen esomeprazole (NEXIUM) 40 MG capsule Take  1 capsule (40 mg total) by mouth daily. (Patient taking differently: Take 40 mg by mouth as needed (reflux). )  . fenofibrate micronized (LOFIBRA) 134 MG capsule TAKE ONE CAPSULE BY MOUTH AT BEDTIME  . furosemide (LASIX) 40 MG tablet Take 40 mg by mouth 2 (two) times daily as needed for fluid.   Marland Kitchen glimepiride (AMARYL) 4 MG tablet Take 1 tablet (4 mg total) by mouth 2 (two) times daily.  Marland Kitchen  glucose blood (FREESTYLE TEST STRIPS) test strip Check Blood sugar bid. Dx E11.9  . hydroxypropyl methylcellulose (ISOPTO TEARS) 2.5 % ophthalmic solution Place 1 drop into both eyes 2 (two) times daily as needed (dry eyes).  Marland Kitchen lisinopril (PRINIVIL,ZESTRIL) 10 MG tablet TAKE ONE HALF TABLET BY MOUTH EVERY DAY  . LUMIGAN 0.01 % SOLN   . metFORMIN (GLUCOPHAGE) 1000 MG tablet Take 1 tablet (1,000 mg total) by mouth 2 (two) times daily with a meal.  . methocarbamol (ROBAXIN) 500 MG tablet Take 1 tablet (500 mg total) by mouth 2 (two) times daily.  . metoprolol succinate (TOPROL-XL) 25 MG 24 hr tablet TAKE ONE TABLET BY MOUTH ONE TIME DAILY  . Multiple Vitamins-Minerals (PRESERVISION AREDS 2 PO) Take by mouth.  . Omega-3 Fatty Acids (FISH OIL) 1000 MG CAPS Take 1 capsule by mouth daily.  Marland Kitchen oxyCODONE-acetaminophen (ROXICET) 5-325 MG tablet Take 1 tablet by mouth 2 (two) times daily as needed for severe pain.  . pravastatin (PRAVACHOL) 80 MG tablet TAKE ONE TABLET BY MOUTH AT BEDTIME  . ZETIA 10 MG tablet TAKE ONE TABLET BY MOUTH EVERY DAY AS DIRECTED  . [DISCONTINUED] sulfamethoxazole-trimethoprim (BACTRIM DS) 800-160 MG tablet Take 1 tablet by mouth 2 (two) times daily.   No facility-administered encounter medications on file as of 11/24/2015.       Objective:   Physical Exam  Constitutional: She is oriented to person, place, and time. She appears well-developed and well-nourished. She appears distressed.  Alert and stressed  HENT:  Head: Normocephalic and atraumatic.  Right Ear: External ear normal.  Left Ear: External ear normal.  Nose: Nose normal.  Mouth/Throat: Oropharynx is clear and moist.  Eyes: Conjunctivae and EOM are normal. Pupils are equal, round, and reactive to light. Right eye exhibits no discharge. Left eye exhibits no discharge. No scleral icterus.  The patient has decreased vision and she is being followed by the ophthalmologist regularly  Neck: Normal range of motion. Neck  supple. No thyromegaly present.  Cardiovascular: Normal rate, regular rhythm, normal heart sounds and intact distal pulses.   No murmur heard. The heart is regular at 84/m  Pulmonary/Chest: Effort normal and breath sounds normal. No respiratory distress. She has no wheezes. She has no rales. She exhibits no tenderness.  Clear anteriorly and posteriorly  Abdominal: Soft. Bowel sounds are normal. She exhibits no mass. There is no tenderness. There is no rebound and no guarding.  No abdominal tenderness no masses no organ enlargement. She does have a ventral hernia.  Musculoskeletal: She exhibits no edema or tenderness.  Range of motion is limited today because of hip pain on the right side and she has trouble laying down and getting up on the table because of this discomfort.  Lymphadenopathy:    She has no cervical adenopathy.  Neurological: She is alert and oriented to person, place, and time. She has normal reflexes. No cranial nerve deficit.  Skin: Skin is warm and dry. No rash noted.  Psychiatric: She has a normal mood and affect. Her behavior is  normal. Judgment and thought content normal.  Nursing note and vitals reviewed.   BP 116/74 mmHg  Pulse 102  Temp(Src) 97.1 F (36.2 C) (Oral)  Ht '5\' 2"'  (1.575 m)  Wt 172 lb (78.019 kg)  BMI 31.45 kg/m2       Assessment & Plan:  1. Dysuria -The patient wants a urine repeated but she is not having symptoms with this today. She has had several recent urinary tract infections. - POCT urinalysis dipstick - POCT UA - Microscopic Only - Urine culture  2. Vitamin D deficiency -She should continue with her current vitamin D replacement  3. Type 2 diabetes mellitus with stage 3 chronic kidney disease, without long-term current use of insulin (HCC) -The creatinine is slightly elevated, the A1c was 7.7, we will not make any changes in the medications today and we'll give her 3 more months to work on the stress in her life and hopefully be  more active and watching her diet more closely  4. Type 2 diabetes mellitus with chronic kidney disease and hypertension ([place]) -The blood pressure is good today and she will continue with current treatment and with current diabetic medicines  5. Essential hypertension, benign -Continue with current treatment  6. Degenerative arthritis of cervical spine -Her neck pain is better but she will keep the appointment with the neurosurgeon  7. CLL (chronic lymphocytic leukemia) (HCC) -The white blood cell count remains elevated but not significantly higher than it has been in the past and we will continue to monitor this.  8. Hyperlipidemia -All cholesterol numbers were excellent and she will continue with her atorvastatin but will reduce the zetia to one half pill daily to help with the cost  9. Hyperlipidemia associated with type 2 diabetes mellitus ([place])  10. Serum potassium elevated - BMP8+EGFR  11. Right hip pain -Tylenol for pain and if no better return to the office for recheck  12. Neck pain -Follow up with neurosurgeon as planned  Patient Instructions   Continue with current medications  Continue to follow diet closely an exercise as much as possible  Continue to be careful and did not put yourself at risk for falling  No climbing You may purchase black cohosh OTC to see if it will help with hot flashes You may decrease your zetia to 1/2 tablet a day                        Medicare Annual Wellness Visit  [facility] and the medical providers at [facility] strive to bring you the best medical care.  In doing so we not only want to address your current medical conditions and concerns but also to detect new conditions early and prevent illness, disease and health-related problems.    Medicare offers a yearly Wellness Visit which allows our clinical staff to assess your need for preventative services including immunizations, lifestyle education, counseling  to decrease risk of preventable diseases and screening for fall risk and other medical concerns.    This visit is provided free of charge (no copay) for all Medicare recipients. The clinical pharmacists at [facility] have begun to conduct these Wellness Visits which will also include a thorough review of all your medications.    As you primary medical provider recommend that you make an appointment for your Annual Wellness Visit if you have not done so already this year.  You may set up this appointment before you leave  today or you may call back [phone] and schedule an appointment.  Please make sure when you call that you mention that you are scheduling your Annual Wellness Visit with the clinical pharmacist so that the appointment may be made for the proper length of time.    The patient should be very careful and did not put herself at risk for falling She should follow-up with the neurosurgeon as planned and make sure that she takes a copy of her MRI report with her when she goes to this visit Hopefully as the stress of her husband's surgery improves she will also improved with the stress levels that she is having at home and she will be able to get out and be more active and watch her diet more closely and we will not change her blood sugar medicines today because of that. If the hip pain that she is experiencing does not get better she should return to the office to have this rechecked We will call with the results of the urinalysis and the repeat BMP as soon as those results become available   Arrie Senate MD

## 2015-11-24 NOTE — Patient Instructions (Addendum)
Continue with current medications  Continue to follow diet closely an exercise as much as possible  Continue to be careful and did not put yourself at risk for falling  No climbing You may purchase black cohosh OTC to see if it will help with hot flashes You may decrease your zetia to 1/2 tablet a day                        Medicare Annual Wellness Visit  [facility] and the medical providers at [facility] strive to bring you the best medical care.  In doing so we not only want to address your current medical conditions and concerns but also to detect new conditions early and prevent illness, disease and health-related problems.    Medicare offers a yearly Wellness Visit which allows our clinical staff to assess your need for preventative services including immunizations, lifestyle education, counseling to decrease risk of preventable diseases and screening for fall risk and other medical concerns.    This visit is provided free of charge (no copay) for all Medicare recipients. The clinical pharmacists at [facility] have begun to conduct these Wellness Visits which will also include a thorough review of all your medications.    As you primary medical provider recommend that you make an appointment for your Annual Wellness Visit if you have not done so already this year.  You may set up this appointment before you leave today or you may call back [phone] and schedule an appointment.  Please make sure when you call that you mention that you are scheduling your Annual Wellness Visit with the clinical pharmacist so that the appointment may be made for the proper length of time.    The patient should be very careful and did not put herself at risk for falling She should follow-up with the neurosurgeon as planned and make sure that she takes a copy of her MRI report with her when she goes to this visit Hopefully as the stress of her husband's surgery  improves she will also improved with the stress levels that she is having at home and she will be able to get out and be more active and watch her diet more closely and we will not change her blood sugar medicines today because of that. If the hip pain that she is experiencing does not get better she should return to the office to have this rechecked We will call with the results of the urinalysis and the repeat BMP as soon as those results become available

## 2015-11-25 LAB — BMP8+EGFR
BUN/Creatinine Ratio: 15 (ref 11–26)
BUN: 16 mg/dL (ref 8–27)
CALCIUM: 10.6 mg/dL — AB (ref 8.7–10.3)
CHLORIDE: 106 mmol/L (ref 96–106)
CO2: 20 mmol/L (ref 18–29)
Creatinine, Ser: 1.07 mg/dL — ABNORMAL HIGH (ref 0.57–1.00)
GFR, EST AFRICAN AMERICAN: 55 mL/min/{1.73_m2} — AB (ref >59)
GFR, EST NON AFRICAN AMERICAN: 47 mL/min/{1.73_m2} — AB (ref >59)
Glucose: 118 mg/dL — ABNORMAL HIGH (ref 65–99)
POTASSIUM: 5.4 mmol/L — AB (ref 3.5–5.2)
Sodium: 143 mmol/L (ref 134–144)

## 2015-11-28 LAB — URINE CULTURE

## 2015-11-29 DIAGNOSIS — M542 Cervicalgia: Secondary | ICD-10-CM | POA: Diagnosis not present

## 2015-11-29 DIAGNOSIS — M47812 Spondylosis without myelopathy or radiculopathy, cervical region: Secondary | ICD-10-CM | POA: Diagnosis not present

## 2015-11-29 MED ORDER — SULFAMETHOXAZOLE-TRIMETHOPRIM 800-160 MG PO TABS: 1.0000 | ORAL_TABLET | Freq: Two times a day (BID) | ORAL | Status: DC

## 2015-11-29 NOTE — Addendum Note (Signed)
Addended by: Thana Ates on: 11/29/2015 11:10 AM   Modules accepted: Orders

## 2015-12-04 ENCOUNTER — Other Ambulatory Visit: Payer: Self-pay | Admitting: *Deleted

## 2015-12-04 MED ORDER — EZETIMIBE 10 MG PO TABS: 10.0000 mg | ORAL_TABLET | Freq: Every day | ORAL | Status: DC

## 2015-12-11 ENCOUNTER — Other Ambulatory Visit: Payer: Medicare Other

## 2015-12-11 DIAGNOSIS — R300 Dysuria: Secondary | ICD-10-CM

## 2015-12-13 LAB — URINE CULTURE

## 2015-12-16 ENCOUNTER — Other Ambulatory Visit: Payer: Self-pay | Admitting: Family Medicine

## 2016-01-05 ENCOUNTER — Encounter: Payer: Self-pay | Admitting: Family Medicine

## 2016-01-05 ENCOUNTER — Ambulatory Visit (INDEPENDENT_AMBULATORY_CARE_PROVIDER_SITE_OTHER): Payer: Medicare Other | Admitting: Family Medicine

## 2016-01-05 VITALS — BP 104/59 | HR 72 | Temp 97.6°F | Ht 62.0 in | Wt 175.1 lb

## 2016-01-05 DIAGNOSIS — R232 Flushing: Secondary | ICD-10-CM

## 2016-01-05 DIAGNOSIS — N951 Menopausal and female climacteric states: Secondary | ICD-10-CM | POA: Diagnosis not present

## 2016-01-05 NOTE — Patient Instructions (Addendum)
Hold Fenofibrate Continue with current BP meds Continue with current blood sugar medicine

## 2016-01-05 NOTE — Progress Notes (Signed)
Subjective:    Patient ID: Kristy [name], female    DOB: [birth year], 82 y.o.   MRN: [id number]  HPI The patient came in today to discuss hot flashes. She is also concerned about the cost of her fenofibrate and wonders if we need to continue to take that.The patient has been under a lot of stress at home. She thinks some of her hot flashes may be related to that. She also thinks it could be related to the blood sugar medicine that was changed recently because of the expense of a previous medicine. She says the hot flashes are getting better. She was wondering if we could stop the fenofibrate. I discussed with her that I thought it would be okay to leave it off until the next visit and if there was a need to restart it we would restart it at that time. This is done primarily for financial reasons because of the expense of the medicine that she is currently taking. She denies any chest pain or shortness of breath. She is not having any problems with her GI tract or with passing her water. She says sometimes her water has an odor this when she is concerned that he could be a urinary tract infection. She is not having that problem currently.    Patient Active Problem List   Diagnosis Date Noted  . Type 2 diabetes mellitus with stage 3 chronic kidney disease ([place]) 07/07/2015  . Type 2 diabetes mellitus with chronic kidney disease and hypertension ([place]) 07/07/2015  . SVT (supraventricular tachycardia) ([place]) 02/08/2015  . Depression 08/24/2013  . Essential hypertension, benign 06/08/2013  . Hyperlipidemia associated with type 2 diabetes mellitus ([place]) 06/08/2013  . Vitamin D deficiency 06/08/2013  . Diabetes mellitus type 2 controlled 06/08/2013  . OA (osteoarthritis) of knee 01/18/2013  . CLL (chronic lymphocytic leukemia) ([place]) 06/17/2012    Current outpatient prescriptions:  .  ADVOCATE LANCETS MISC, Check Blood Sugar bid. Dx E11.9, Disp: 100 each, Rfl: 2 .  ALPRAZolam (XANAX) 0.25 MG tablet,  TAKE 1/2 TABLET TWICE DAILY AS NEEDED OR 1 TABLET AT BEDTIME, Disp: 90 tablet, Rfl: 0 .  aspirin EC 81 MG tablet, Take 81 mg by mouth daily., Disp: , Rfl:  .  cholecalciferol (VITAMIN D) 1000 UNITS tablet, Take 1,000 Units by mouth daily., Disp: , Rfl:  .  esomeprazole (NEXIUM) 40 MG capsule, Take 1 capsule (40 mg total) by mouth daily. (Patient taking differently: Take 40 mg by mouth as needed (reflux). ), Disp: 30 capsule, Rfl: 3 .  ezetimibe (ZETIA) 10 MG tablet, Take 1 tablet (10 mg total) by mouth daily. as directed, Disp: 30 tablet, Rfl: 5 .  fenofibrate micronized (LOFIBRA) 134 MG capsule, TAKE ONE CAPSULE BY MOUTH AT BEDTIME, Disp: 30 capsule, Rfl: 3 .  furosemide (LASIX) 40 MG tablet, Take 40 mg by mouth 2 (two) times daily as needed for fluid. , Disp: , Rfl:  .  glimepiride (AMARYL) 4 MG tablet, Take 1 tablet (4 mg total) by mouth 2 (two) times daily., Disp: 60 tablet, Rfl: 1 .  glucose blood (FREESTYLE TEST STRIPS) test strip, Check Blood sugar bid. Dx E11.9, Disp: 300 each, Rfl: 2 .  hydroxypropyl methylcellulose (ISOPTO TEARS) 2.5 % ophthalmic solution, Place 1 drop into both eyes 2 (two) times daily as needed (dry eyes)., Disp: , Rfl:  .  lisinopril (PRINIVIL,ZESTRIL) 10 MG tablet, TAKE ONE HALF TABLET BY MOUTH EVERY DAY, Disp: 30 tablet, Rfl: 5 .  LUMIGAN 0.01 %  SOLN, , Disp: , Rfl:  .  metFORMIN (GLUCOPHAGE) 1000 MG tablet, TAKE 1 TABLET BY MOUTH WITH MEALS BREAKFAST AND SUPPER, Disp: 60 tablet, Rfl: 2 .  methocarbamol (ROBAXIN) 500 MG tablet, Take 1 tablet (500 mg total) by mouth 2 (two) times daily., Disp: 30 tablet, Rfl: 0 .  metoprolol succinate (TOPROL-XL) 25 MG 24 hr tablet, TAKE ONE TABLET BY MOUTH ONE TIME DAILY, Disp: 30 tablet, Rfl: 3 .  Multiple Vitamins-Minerals (PRESERVISION AREDS 2 PO), Take by mouth., Disp: , Rfl:  .  Omega-3 Fatty Acids (FISH OIL) 1000 MG CAPS, Take 1 capsule by mouth daily., Disp: , Rfl:  .  pravastatin (PRAVACHOL) 80 MG tablet, TAKE ONE TABLET BY  MOUTH AT BEDTIME, Disp: 90 tablet, Rfl: 2   Review of Systems  Constitutional: Negative.   HENT: Negative.   Eyes: Negative.   Respiratory: Negative.   Cardiovascular: Negative.   Gastrointestinal: Negative.   Endocrine: Positive for heat intolerance.  Genitourinary: Negative.   Musculoskeletal: Negative.   Skin: Negative.   Allergic/Immunologic: Negative.   Neurological: Negative.   Hematological: Negative.   Psychiatric/Behavioral: Negative.        Objective:   Physical Exam  Constitutional: She is oriented to person, place, and time. She appears well-developed and well-nourished. No distress.  HENT:  Head: Normocephalic and atraumatic.  Eyes: Conjunctivae and EOM are normal. Pupils are equal, round, and reactive to light. Right eye exhibits no discharge. Left eye exhibits no discharge.  Neck: Normal range of motion. Neck supple. No thyromegaly present.  Cardiovascular: Normal rate, regular rhythm and normal heart sounds.   No murmur heard. Pulmonary/Chest: Effort normal and breath sounds normal. She has no wheezes. She has no rales.  Abdominal: Soft. Bowel sounds are normal. There is no tenderness. There is no rebound and no guarding.  Musculoskeletal: She exhibits no edema.  Range of motion is somewhat hesitant as it has been in the past especially when arising from a sitting position  Neurological: She is alert and oriented to person, place, and time.  Skin: Skin is warm and dry. No rash noted.  Psychiatric: She has a normal mood and affect. Her behavior is normal. Thought content normal.  Nursing note and vitals reviewed.  BP 104/59 mmHg  Pulse 72  Temp(Src) 97.6 F (36.4 C) (Oral)  Ht '5\' 2"'  (1.575 m)  Wt 175 lb 2 oz (79.436 kg)  BMI 32.02 kg/m2        Assessment & Plan:  1. Hot flashes -Continue current treatment -Hold the fenofibrate due to financial reasons and recheck cholesterol at next visit and check for a cheaper alternative if there is a need to  restart fenofibrate - Thyroid Panel With TSH - BMP8+EGFR - CBC with Differential/Platelet  Patient Instructions  Hold Fenofibrate Continue with current BP meds Continue with current blood sugar medicine   Arrie Senate MD

## 2016-01-06 LAB — BMP8+EGFR
BUN / CREAT RATIO: 15 (ref 12–28)
BUN: 13 mg/dL (ref 8–27)
CO2: 22 mmol/L (ref 18–29)
CREATININE: 0.88 mg/dL (ref 0.57–1.00)
Calcium: 9.4 mg/dL (ref 8.7–10.3)
Chloride: 104 mmol/L (ref 96–106)
GFR calc non Af Amer: 60 mL/min/{1.73_m2} (ref >59)
GFR, EST AFRICAN AMERICAN: 69 mL/min/{1.73_m2} (ref >59)
Glucose: 165 mg/dL — ABNORMAL HIGH (ref 65–99)
Potassium: 5 mmol/L (ref 3.5–5.2)
Sodium: 145 mmol/L — ABNORMAL HIGH (ref 134–144)

## 2016-01-06 LAB — CBC WITH DIFFERENTIAL/PLATELET
Basophils Absolute: 0 10*3/uL (ref 0.0–0.2)
Basos: 0 %
EOS (ABSOLUTE): 0.2 10*3/uL (ref 0.0–0.4)
EOS: 1 %
HEMATOCRIT: 36.1 % (ref 34.0–46.6)
HEMOGLOBIN: 11.9 g/dL (ref 11.1–15.9)
Immature Grans (Abs): 0 10*3/uL (ref 0.0–0.1)
Immature Granulocytes: 0 %
LYMPHS ABS: 6.5 10*3/uL — AB (ref 0.7–3.1)
Lymphs: 47 %
MCH: 29.8 pg (ref 26.6–33.0)
MCHC: 33 g/dL (ref 31.5–35.7)
MCV: 90 fL (ref 79–97)
MONOCYTES: 6 %
Monocytes Absolute: 0.8 10*3/uL (ref 0.1–0.9)
NEUTROS ABS: 6.4 10*3/uL (ref 1.4–7.0)
Neutrophils: 46 %
Platelets: 188 10*3/uL (ref 150–379)
RBC: 4 x10E6/uL (ref 3.77–5.28)
RDW: 14.3 % (ref 12.3–15.4)
WBC: 13.9 10*3/uL — ABNORMAL HIGH (ref 3.4–10.8)

## 2016-01-06 LAB — THYROID PANEL WITH TSH
FREE THYROXINE INDEX: 2.1 (ref 1.2–4.9)
T3 Uptake Ratio: 31 % (ref 24–39)
T4, Total: 6.7 ug/dL (ref 4.5–12.0)
TSH: 1.39 u[IU]/mL (ref 0.450–4.500)

## 2016-01-15 ENCOUNTER — Other Ambulatory Visit: Payer: Self-pay | Admitting: Family Medicine

## 2016-01-16 ENCOUNTER — Other Ambulatory Visit: Payer: Self-pay | Admitting: Family Medicine

## 2016-01-16 NOTE — Telephone Encounter (Signed)
Last seen 01/05/16  DWM  If approved route to nurse to call into [facility]

## 2016-01-24 ENCOUNTER — Other Ambulatory Visit: Payer: Self-pay | Admitting: Family Medicine

## 2016-01-26 DIAGNOSIS — H401290 Low-tension glaucoma, unspecified eye, stage unspecified: Secondary | ICD-10-CM | POA: Diagnosis not present

## 2016-01-26 DIAGNOSIS — Z961 Presence of intraocular lens: Secondary | ICD-10-CM | POA: Diagnosis not present

## 2016-01-26 DIAGNOSIS — H26492 Other secondary cataract, left eye: Secondary | ICD-10-CM | POA: Diagnosis not present

## 2016-02-14 DIAGNOSIS — H43811 Vitreous degeneration, right eye: Secondary | ICD-10-CM | POA: Diagnosis not present

## 2016-02-14 DIAGNOSIS — E113293 Type 2 diabetes mellitus with mild nonproliferative diabetic retinopathy without macular edema, bilateral: Secondary | ICD-10-CM | POA: Diagnosis not present

## 2016-02-14 DIAGNOSIS — H353113 Nonexudative age-related macular degeneration, right eye, advanced atrophic without subfoveal involvement: Secondary | ICD-10-CM | POA: Diagnosis not present

## 2016-02-14 DIAGNOSIS — H353124 Nonexudative age-related macular degeneration, left eye, advanced atrophic with subfoveal involvement: Secondary | ICD-10-CM | POA: Diagnosis not present

## 2016-02-29 ENCOUNTER — Ambulatory Visit (INDEPENDENT_AMBULATORY_CARE_PROVIDER_SITE_OTHER): Payer: Medicare Other | Admitting: Ophthalmology

## 2016-03-01 ENCOUNTER — Other Ambulatory Visit: Payer: Self-pay | Admitting: Family Medicine

## 2016-03-20 ENCOUNTER — Other Ambulatory Visit: Payer: Self-pay | Admitting: Family Medicine

## 2016-03-25 ENCOUNTER — Telehealth: Payer: Self-pay | Admitting: Family Medicine

## 2016-03-25 ENCOUNTER — Other Ambulatory Visit: Payer: Self-pay | Admitting: *Deleted

## 2016-03-25 DIAGNOSIS — Z0000 Encounter for general adult medical examination without abnormal findings: Secondary | ICD-10-CM

## 2016-03-25 DIAGNOSIS — E785 Hyperlipidemia, unspecified: Principal | ICD-10-CM

## 2016-03-25 DIAGNOSIS — E559 Vitamin D deficiency, unspecified: Secondary | ICD-10-CM

## 2016-03-25 DIAGNOSIS — E1122 Type 2 diabetes mellitus with diabetic chronic kidney disease: Secondary | ICD-10-CM

## 2016-03-25 DIAGNOSIS — E1169 Type 2 diabetes mellitus with other specified complication: Secondary | ICD-10-CM

## 2016-03-25 DIAGNOSIS — I10 Essential (primary) hypertension: Secondary | ICD-10-CM

## 2016-03-25 DIAGNOSIS — N183 Chronic kidney disease, stage 3 (moderate): Secondary | ICD-10-CM

## 2016-03-25 NOTE — Telephone Encounter (Signed)
Lab orders placed.  

## 2016-03-27 ENCOUNTER — Other Ambulatory Visit: Payer: Medicare Other

## 2016-03-27 DIAGNOSIS — E785 Hyperlipidemia, unspecified: Principal | ICD-10-CM

## 2016-03-27 DIAGNOSIS — E1122 Type 2 diabetes mellitus with diabetic chronic kidney disease: Secondary | ICD-10-CM

## 2016-03-27 DIAGNOSIS — N183 Chronic kidney disease, stage 3 (moderate): Secondary | ICD-10-CM | POA: Diagnosis not present

## 2016-03-27 DIAGNOSIS — Z0000 Encounter for general adult medical examination without abnormal findings: Secondary | ICD-10-CM

## 2016-03-27 DIAGNOSIS — E559 Vitamin D deficiency, unspecified: Secondary | ICD-10-CM

## 2016-03-27 DIAGNOSIS — I10 Essential (primary) hypertension: Secondary | ICD-10-CM

## 2016-03-27 DIAGNOSIS — E1169 Type 2 diabetes mellitus with other specified complication: Secondary | ICD-10-CM

## 2016-03-27 DIAGNOSIS — Z7689 Persons encountering health services in other specified circumstances: Secondary | ICD-10-CM | POA: Diagnosis not present

## 2016-03-28 LAB — BMP8+EGFR
BUN / CREAT RATIO: 13 (ref 12–28)
BUN: 12 mg/dL (ref 8–27)
CO2: 23 mmol/L (ref 18–29)
CREATININE: 0.89 mg/dL (ref 0.57–1.00)
Calcium: 9.8 mg/dL (ref 8.7–10.3)
Chloride: 105 mmol/L (ref 96–106)
GFR calc non Af Amer: 59 mL/min/{1.73_m2} — ABNORMAL LOW (ref >59)
GFR, EST AFRICAN AMERICAN: 68 mL/min/{1.73_m2} (ref >59)
GLUCOSE: 156 mg/dL — AB (ref 65–99)
Potassium: 4.9 mmol/L (ref 3.5–5.2)
SODIUM: 144 mmol/L (ref 134–144)

## 2016-03-28 LAB — NMR, LIPOPROFILE
Cholesterol: 134 mg/dL (ref 100–199)
HDL CHOLESTEROL BY NMR: 54 mg/dL (ref >39)
HDL Particle Number: 44.4 umol/L (ref >=30.5)
LDL PARTICLE NUMBER: 815 nmol/L (ref <1000)
LDL Size: 20.4 nm (ref >20.5)
LDL-C: 48 mg/dL (ref 0–99)
LP-IR Score: 41 (ref <=45)
Small LDL Particle Number: 366 nmol/L (ref <=527)
Triglycerides by NMR: 162 mg/dL — ABNORMAL HIGH (ref 0–149)

## 2016-03-28 LAB — HEPATIC FUNCTION PANEL
ALK PHOS: 52 IU/L (ref 39–117)
ALT: 14 IU/L (ref 0–32)
AST: 15 IU/L (ref 0–40)
Albumin: 4.1 g/dL (ref 3.5–4.7)
Bilirubin Total: 0.5 mg/dL (ref 0.0–1.2)
Bilirubin, Direct: 0.14 mg/dL (ref 0.00–0.40)
TOTAL PROTEIN: 6.2 g/dL (ref 6.0–8.5)

## 2016-03-28 LAB — CBC WITH DIFFERENTIAL/PLATELET
BASOS ABS: 0 10*3/uL (ref 0.0–0.2)
Basos: 0 %
EOS (ABSOLUTE): 0.2 10*3/uL (ref 0.0–0.4)
Eos: 1 %
HEMOGLOBIN: 13 g/dL (ref 11.1–15.9)
Hematocrit: 39.3 % (ref 34.0–46.6)
Immature Grans (Abs): 0 10*3/uL (ref 0.0–0.1)
Immature Granulocytes: 0 %
LYMPHS ABS: 6.7 10*3/uL — AB (ref 0.7–3.1)
Lymphs: 53 %
MCH: 30.2 pg (ref 26.6–33.0)
MCHC: 33.1 g/dL (ref 31.5–35.7)
MCV: 91 fL (ref 79–97)
Monocytes Absolute: 1 10*3/uL — ABNORMAL HIGH (ref 0.1–0.9)
Monocytes: 8 %
NEUTROS ABS: 4.8 10*3/uL (ref 1.4–7.0)
Neutrophils: 38 %
PLATELETS: 185 10*3/uL (ref 150–379)
RBC: 4.31 x10E6/uL (ref 3.77–5.28)
RDW: 13.7 % (ref 12.3–15.4)
WBC: 12.6 10*3/uL — ABNORMAL HIGH (ref 3.4–10.8)

## 2016-03-28 LAB — THYROID PANEL WITH TSH
FREE THYROXINE INDEX: 2.6 (ref 1.2–4.9)
T3 Uptake Ratio: 30 % (ref 24–39)
T4, Total: 8.8 ug/dL (ref 4.5–12.0)
TSH: 2.07 u[IU]/mL (ref 0.450–4.500)

## 2016-03-28 LAB — VITAMIN D 25 HYDROXY (VIT D DEFICIENCY, FRACTURES): VIT D 25 HYDROXY: 42.5 ng/mL (ref 30.0–100.0)

## 2016-03-29 ENCOUNTER — Ambulatory Visit (INDEPENDENT_AMBULATORY_CARE_PROVIDER_SITE_OTHER): Payer: Medicare Other | Admitting: Family Medicine

## 2016-03-29 ENCOUNTER — Encounter: Payer: Self-pay | Admitting: Family Medicine

## 2016-03-29 ENCOUNTER — Ambulatory Visit (INDEPENDENT_AMBULATORY_CARE_PROVIDER_SITE_OTHER): Payer: Medicare Other

## 2016-03-29 VITALS — BP 122/73 | HR 68 | Temp 97.5°F | Ht 62.0 in | Wt 167.0 lb

## 2016-03-29 DIAGNOSIS — E559 Vitamin D deficiency, unspecified: Secondary | ICD-10-CM

## 2016-03-29 DIAGNOSIS — E785 Hyperlipidemia, unspecified: Secondary | ICD-10-CM

## 2016-03-29 DIAGNOSIS — R195 Other fecal abnormalities: Secondary | ICD-10-CM

## 2016-03-29 DIAGNOSIS — R197 Diarrhea, unspecified: Secondary | ICD-10-CM

## 2016-03-29 DIAGNOSIS — Z1211 Encounter for screening for malignant neoplasm of colon: Secondary | ICD-10-CM

## 2016-03-29 DIAGNOSIS — N183 Chronic kidney disease, stage 3 unspecified: Secondary | ICD-10-CM

## 2016-03-29 DIAGNOSIS — E1122 Type 2 diabetes mellitus with diabetic chronic kidney disease: Secondary | ICD-10-CM

## 2016-03-29 DIAGNOSIS — I10 Essential (primary) hypertension: Secondary | ICD-10-CM

## 2016-03-29 DIAGNOSIS — E1169 Type 2 diabetes mellitus with other specified complication: Secondary | ICD-10-CM

## 2016-03-29 NOTE — Patient Instructions (Addendum)
Medicare Annual Wellness Visit  [facility] and the medical providers at [facility] strive to bring you the best medical care.  In doing so we not only want to address your current medical conditions and concerns but also to detect new conditions early and prevent illness, disease and health-related problems.    Medicare offers a yearly Wellness Visit which allows our clinical staff to assess your need for preventative services including immunizations, lifestyle education, counseling to decrease risk of preventable diseases and screening for fall risk and other medical concerns.    This visit is provided free of charge (no copay) for all Medicare recipients. The clinical pharmacists at [facility] have begun to conduct these Wellness Visits which will also include a thorough review of all your medications.    As you primary medical provider recommend that you make an appointment for your Annual Wellness Visit if you have not done so already this year.  You may set up this appointment before you leave today or you may call back [phone] and schedule an appointment.  Please make sure when you call that you mention that you are scheduling your Annual Wellness Visit with the clinical pharmacist so that the appointment may be made for the proper length of time.     Continue current medications. Continue good therapeutic lifestyle changes which include good diet and exercise. Fall precautions discussed with patient. If an FOBT was given today- please return it to our front desk. If you are over 48 years old - you may need Prevnar 65 or the adult Pneumonia vaccine.  **Flu shots are available--- please call and schedule a FLU-CLINIC appointment**  After your visit with [place] today you will receive a survey in the mail or online from [employer] regarding your care with [place]. Please take a moment to fill this out. Your feedback is very  important to [place] as you can help [place] better understand your patient needs as well as improve your experience and satisfaction. WE CARE ABOUT YOU!!!   The patient will make every effort to exercise more and improve her stamina with walking. She will watch her diet more closely If the triglycerides do not come down by the next visit we will add the fenofibrate back to her treatment regimen She needs to be careful and make every effort to keep herself from falling

## 2016-03-29 NOTE — Progress Notes (Signed)
Subjective:    Patient ID: Kristy [name], female    DOB: July 16, 1928, 82 y.o.   MRN: [id number]  HPI Pt here for follow up and management of chronic medical problems which includes diabetes, hyperlipidemia, and hypertension. She is taking medications regularly.The patient has had some loose bowel movements. She is taking metformin 1000 mg twice daily. The patient today is due to have a chest x-ray return an FOBT and we will review her lab work which has already been done. The total LDL particle number was good at 8:15. The triglycerides were elevated at 162. The CBC has a white blood cell count that remains slightly elevated as it has been in the past but the hemoglobin was good. All liver function tests vitamin D levels and thyroid function tests were within normal limits. The patient denies any chest pain palpitations or chest pressure. She does not have any shortness of breath but she complains of fatigue with activity. She denies any problems with blood in the stool or black tarry bowel movements but does have occasional heartburn and she does have Nexium and Zantac on hand but does not take these regularly. She says the heartburn is worse in the morning. She is passing her water without problems. In reviewing her lab work her triglycerides were elevated and it must be noted that her fenofibrate was discontinued because of the expense and this most likely explains the elevation in the triglycerides. We discussed this and we decided to check the triglycerides 1 more time in about 3-4 months and if they remain elevated she will go back on the fenofibrate at that time. In the meantime she will continue to watch her diet more aggressively and try to lose some weight. The patient had a complaint today of loose bowel movements but on further questioning her these have improved and they're no longer loose for the past 4 weeks. If the loose bowel movements continue we will consider changing to a different  preparation of metformin.     Patient Active Problem List   Diagnosis Date Noted  . Type 2 diabetes mellitus with stage 3 chronic kidney disease ([place]) 07/07/2015  . Type 2 diabetes mellitus with chronic kidney disease and hypertension ([place]) 07/07/2015  . SVT (supraventricular tachycardia) ([place]) 02/08/2015  . Depression 08/24/2013  . Essential hypertension, benign 06/08/2013  . Hyperlipidemia associated with type 2 diabetes mellitus ([place]) 06/08/2013  . Vitamin D deficiency 06/08/2013  . Diabetes mellitus type 2 controlled 06/08/2013  . OA (osteoarthritis) of knee 01/18/2013  . CLL (chronic lymphocytic leukemia) ([place]) 06/17/2012   Outpatient Encounter Prescriptions as of 03/29/2016  Medication Sig  . ADVOCATE LANCETS MISC Check Blood Sugar bid. Dx E11.9  . ALPRAZolam (XANAX) 0.25 MG tablet TAKE 1/2 TABLET BY MOUTH TWICE A DAY AS NEEDED OR 1 AT BEDTIME  . aspirin EC 81 MG tablet Take 81 mg by mouth daily.  . cholecalciferol (VITAMIN D) 1000 UNITS tablet Take 1,000 Units by mouth daily.  [facility] Kitchen esomeprazole (NEXIUM) 40 MG capsule Take 1 capsule (40 mg total) by mouth daily. (Patient taking differently: Take 40 mg by mouth as needed (reflux). )  . ezetimibe (ZETIA) 10 MG tablet Take 1 tablet (10 mg total) by mouth daily. as directed  . fenofibrate micronized (LOFIBRA) 134 MG capsule TAKE ONE CAPSULE BY MOUTH AT BEDTIME  . furosemide (LASIX) 40 MG tablet Take 40 mg by mouth 2 (two) times daily as needed for fluid.   [place] Kitchen glimepiride (AMARYL) 4 MG tablet  TAKE 1 TABLET TWICE A DAY  . glucose blood (FREESTYLE TEST STRIPS) test strip Check Blood sugar bid. Dx E11.9  . hydroxypropyl methylcellulose (ISOPTO TEARS) 2.5 % ophthalmic solution Place 1 drop into both eyes 2 (two) times daily as needed (dry eyes).  Mar[place] Kitchen lisinopril (PRINIVIL,ZESTRIL) 10 MG tablet TAKE 1/2 TABLET DAILY AS DIRECTED  . LUMIGAN 0.01 % SOLN   . metFORMIN (GLUCOPHAGE) 1000 MG tablet TAKE 1 TABLET BY MOUTH WITH MEALS BREAKFAST AND SUPPER   . methocarbamol (ROBAXIN) 500 MG tablet Take 1 tablet (500 mg total) by mouth 2 (two) times daily.  . metoprolol succinate (TOPROL-XL) 25 MG 24 hr tablet TAKE 1 TABLET EVERY DAY  . Multiple Vitamins-Minerals (PRESERVISION AREDS 2 PO) Take by mouth.  . Omega-3 Fatty Acids (FISH OIL) 1000 MG CAPS Take 1 capsule by mouth daily.  . pravastatin (PRAVACHOL) 80 MG tablet TAKE ONE TABLET BY MOUTH AT BEDTIME   No facility-administered encounter medications on file as of 03/29/2016.      Review of Systems  Constitutional: Negative.   HENT: Negative.   Eyes: Negative.   Respiratory: Negative.   Cardiovascular: Negative.   Gastrointestinal: Positive for diarrhea. Negative for nausea and blood in stool.  Endocrine: Negative.   Genitourinary: Negative.   Musculoskeletal: Negative.   Skin: Negative.   Allergic/Immunologic: Negative.   Neurological: Negative.   Hematological: Negative.   Psychiatric/Behavioral: Negative.        Objective:   Physical Exam  Constitutional: She is oriented to person, place, and time. She appears well-developed and well-nourished. No distress.  The patient is pleasant and alert and in no acute distress. She comes to the visit today with her granddaughter.  HENT:  Head: Normocephalic and atraumatic.  Right Ear: External ear normal.  Left Ear: External ear normal.  Nose: Nose normal.  Mouth/Throat: Oropharynx is clear and moist.  She wears bilateral hearing aids.  Eyes: Conjunctivae and EOM are normal. Pupils are equal, round, and reactive to light. Right eye exhibits no discharge. Left eye exhibits no discharge. No scleral icterus.  Neck: Normal range of motion. Neck supple. No thyromegaly present.  No bruits or thyromegaly  Cardiovascular: Normal rate, regular rhythm, normal heart sounds and intact distal pulses.   No murmur heard. Heart is regular at 60/m  Pulmonary/Chest: Effort normal and breath sounds normal. No respiratory distress. She has no wheezes.  She has no rales. She exhibits no tenderness.  Lungs are clear anteriorly and posteriorly  Abdominal: Soft. Bowel sounds are normal. She exhibits no mass. There is no tenderness. There is no rebound and no guarding.  No liver or spleen enlargement. No bruits. No abdominal tenderness.  Musculoskeletal: She exhibits no edema or tenderness.  The patient continues to have a hesitant range of motion due to her past surgeries.  Lymphadenopathy:    She has no cervical adenopathy.  Neurological: She is alert and oriented to person, place, and time. She has normal reflexes. No cranial nerve deficit.  Skin: Skin is warm and dry. No rash noted.  Psychiatric: She has a normal mood and affect. Her behavior is normal. Judgment and thought content normal.  Nursing note and vitals reviewed.   BP 122/73 mmHg  Pulse 68  Temp(Src) 97.5 F (36.4 C) (Oral)  Ht 5\' 2"  (1.575 m)  Wt 167 lb (75.751 kg)  BMI 30.54 kg/m2  [facility] reading (PRIMARY) by  Dr.Dontarius Sheley-chest x-ray with results pending  Assessment & Plan:  1. Type 2 diabetes mellitus with stage 3 chronic kidney disease, without long-term current use of insulin ([place]) -Continue aggressive therapeutic lifestyle changes and current treatment. If loose bowel movements continue to recur we will consider changing the metformin. Hemoglobin A1c will be added to her lab work she has had recently done.  2. Vitamin D deficiency -Continue current treatment pending results of lab work  3. Essential hypertension, benign -The blood pressure is good today and the patient will continue with current treatment - DG Chest 2 View; Future  4. Hyperlipidemia associated with type 2 diabetes mellitus ([place]) -All cholesterol numbers were excellent other than the triglycerides being elevated. If they remain elevated after efforts at diet and exercise we will add the fenofibric back to her treatment regimen at the next visit - DG Chest 2  View; Future  5. Special screening for malignant neoplasms, colon - Fecal occult blood, imunochemical; Future  6. Loose bowel movements -Even though this was a complaint the loose stools have actually improved and have not been a problem over the past 4 weeks.  Patient Instructions                       Medicare Annual Wellness Visit  [facility] and the medical providers at [facility] strive to bring you the best medical care.  In doing so we not only want to address your current medical conditions and concerns but also to detect new conditions early and prevent illness, disease and health-related problems.    Medicare offers a yearly Wellness Visit which allows our clinical staff to assess your need for preventative services including immunizations, lifestyle education, counseling to decrease risk of preventable diseases and screening for fall risk and other medical concerns.    This visit is provided free of charge (no copay) for all Medicare recipients. The clinical pharmacists at [facility] have begun to conduct these Wellness Visits which will also include a thorough review of all your medications.    As you primary medical provider recommend that you make an appointment for your Annual Wellness Visit if you have not done so already this year.  You may set up this appointment before you leave today or you may call back [phone] and schedule an appointment.  Please make sure when you call that you mention that you are scheduling your Annual Wellness Visit with the clinical pharmacist so that the appointment may be made for the proper length of time.     Continue current medications. Continue good therapeutic lifestyle changes which include good diet and exercise. Fall precautions discussed with patient. If an FOBT was given today- please return it to our front desk. If you are over 71 years old - you may need Prevnar 81 or the adult  Pneumonia vaccine.  **Flu shots are available--- please call and schedule a FLU-CLINIC appointment**  After your visit with [place] today you will receive a survey in the mail or online from [employer] regarding your care with [place]. Please take a moment to fill this out. Your feedback is very important to [place] as you can help [place] better understand your patient needs as well as improve your experience and satisfaction. WE CARE ABOUT YOU!!!   The patient will make every effort to exercise more and improve her stamina with walking. She will watch her diet more closely If the triglycerides do not come down by the next visit we  will add the fenofibrate back to her treatment regimen She needs to be careful and make every effort to keep herself from falling    Arrie Senate MD

## 2016-03-30 LAB — HGB A1C W/O EAG: HEMOGLOBIN A1C: 7.2 % — AB (ref 4.8–5.6)

## 2016-03-30 LAB — SPECIMEN STATUS REPORT

## 2016-04-10 ENCOUNTER — Other Ambulatory Visit: Payer: Medicare Other

## 2016-04-10 DIAGNOSIS — Z1211 Encounter for screening for malignant neoplasm of colon: Secondary | ICD-10-CM

## 2016-04-13 LAB — FECAL OCCULT BLOOD, IMMUNOCHEMICAL: Fecal Occult Bld: NEGATIVE

## 2016-04-26 ENCOUNTER — Other Ambulatory Visit: Payer: Self-pay | Admitting: Family Medicine

## 2016-04-30 ENCOUNTER — Other Ambulatory Visit: Payer: Self-pay | Admitting: Family Medicine

## 2016-05-15 DIAGNOSIS — H353133 Nonexudative age-related macular degeneration, bilateral, advanced atrophic without subfoveal involvement: Secondary | ICD-10-CM | POA: Diagnosis not present

## 2016-05-15 DIAGNOSIS — H401130 Primary open-angle glaucoma, bilateral, stage unspecified: Secondary | ICD-10-CM | POA: Diagnosis not present

## 2016-05-15 DIAGNOSIS — H53413 Scotoma involving central area, bilateral: Secondary | ICD-10-CM | POA: Diagnosis not present

## 2016-05-15 DIAGNOSIS — H35372 Puckering of macula, left eye: Secondary | ICD-10-CM | POA: Diagnosis not present

## 2016-06-04 ENCOUNTER — Ambulatory Visit: Payer: Medicare Other

## 2016-06-07 ENCOUNTER — Ambulatory Visit (INDEPENDENT_AMBULATORY_CARE_PROVIDER_SITE_OTHER): Payer: Medicare Other | Admitting: *Deleted

## 2016-06-07 VITALS — BP 155/80 | HR 79 | Ht 62.5 in | Wt 167.0 lb

## 2016-06-07 DIAGNOSIS — Z0000 Encounter for general adult medical examination without abnormal findings: Secondary | ICD-10-CM

## 2016-06-07 NOTE — Patient Instructions (Addendum)
  Ms. Sudol , Thank you for taking time to come for your Medicare Wellness Visit. I appreciate your ongoing commitment to your health goals. Please review the following plan we discussed and let me know if I can assist you in the future.   These are the goals we discussed: Goals    . Exercise 150 minutes per week (moderate activity)          Walk for 15 minutes 3 times per week. Increase up to 30 minutes as tolerated.         This is a list of the screening recommended for you and due dates:  Health Maintenance  Topic Date Due  . Flu Shot  04/30/2016  . Pneumonia vaccines (2 of 2 - PPSV23) 09/28/2016*  . Eye exam for diabetics  06/08/2016  . Hemoglobin A1C  09/26/2016  . Complete foot exam   03/29/2017  . DEXA scan (bone density measurement)  01/29/2020  . Tetanus Vaccine  07/15/2021  . Shingles Vaccine  Completed  *Topic was postponed. The date shown is not the original due date.

## 2016-06-10 NOTE — Progress Notes (Signed)
Subjective:   Kristy [name] is a 82 y.o. female who presents for a subsequent Medicare Annual Wellness Visit. Kristy [name] lives at home with her husband. She has 1 son and 3 grandchildren. She also has one sone that is deceased.   Review of Systems    All systems negative.   Cardiac Risk Factors include: advanced age (>62men, >49 women);diabetes mellitus;dyslipidemia;hypertension;sedentary lifestyle     Objective:    Today's Vitals   06/07/16 0953  BP: (!) 155/80  Pulse: 79  Weight: 167 lb (75.8 kg)  Height: 5' 2.5" (1.588 m)   Body mass index is 30.06 kg/m.   Current Medications (verified) Outpatient Encounter Prescriptions as of 06/07/2016  Medication Sig  . ALPRAZolam (XANAX) 0.25 MG tablet TAKE 1/2 TABLET BY MOUTH TWICE A DAY AS NEEDED OR 1 AT BEDTIME  . aspirin EC 81 MG tablet Take 81 mg by mouth daily.  . cholecalciferol (VITAMIN D) 1000 UNITS tablet Take 1,000 Units by mouth daily.  Marland Kitchen esomeprazole (NEXIUM) 40 MG capsule Take 1 capsule (40 mg total) by mouth daily. (Patient taking differently: Take 40 mg by mouth as needed (reflux). )  . ezetimibe (ZETIA) 10 MG tablet Take 1 tablet (10 mg total) by mouth daily. as directed  . fenofibrate micronized (LOFIBRA) 134 MG capsule TAKE ONE CAPSULE BY MOUTH AT BEDTIME  . furosemide (LASIX) 40 MG tablet Take 40 mg by mouth 2 (two) times daily as needed for fluid.   Marland Kitchen glimepiride (AMARYL) 4 MG tablet TAKE 1 TABLET TWICE A DAY  . hydroxypropyl methylcellulose (ISOPTO TEARS) 2.5 % ophthalmic solution Place 1 drop into both eyes 2 (two) times daily as needed (dry eyes).  Marland Kitchen lisinopril (PRINIVIL,ZESTRIL) 10 MG tablet TAKE 1/2 TABLET DAILY AS DIRECTED  . LUMIGAN 0.01 % SOLN   . metFORMIN (GLUCOPHAGE) 1000 MG tablet TAKE 1 TABLET BY MOUTH WITH MEALS BREAKFAST AND SUPPER  . metoprolol succinate (TOPROL-XL) 25 MG 24 hr tablet TAKE 1 TABLET EVERY DAY  . Multiple Vitamins-Minerals (PRESERVISION AREDS 2 PO) Take by mouth.  . Omega-3 Fatty  Acids (FISH OIL) 1000 MG CAPS Take 1 capsule by mouth daily.  . pravastatin (PRAVACHOL) 80 MG tablet TAKE ONE TABLET BY MOUTH AT BEDTIME  . ADVOCATE LANCETS MISC Check Blood Sugar bid. Dx E11.9  . glucose blood (FREESTYLE TEST STRIPS) test strip Check Blood sugar bid. Dx E11.9  . methocarbamol (ROBAXIN) 500 MG tablet Take 1 tablet (500 mg total) by mouth 2 (two) times daily. (Patient not taking: Reported on 06/07/2016)   No facility-administered encounter medications on file as of 06/07/2016.     Allergies (verified) Vioxx [rofecoxib]   History: Past Medical History:  Diagnosis Date  . Anxiety   . Colon polyps   . Complication of anesthesia 01-18-2013   slow to awaken and felt crazy for 3-4 days  . Diabetes mellitus   . Diverticulitis   . H/O hiatal hernia   . Hearing loss 01-07-13   bilateral hearing aids  . History of shingles 01-07-13   3'14 recent outbreak- drying in   . Hypercholesteremia   . Hypertension    Dr. Laurance Flatten  . Lumbar spondylosis    osteoarthritis-knees  . Macular degeneration of both eyes    and dry eyes   Past Surgical History:  Procedure Laterality Date  . ABDOMINAL HYSTERECTOMY  1987  . CATARACT EXTRACTION    . CHOLECYSTECTOMY  1989   open  . KNEE ARTHROSCOPY  01-07-13   left knee-  many yrs ago  . Lumbar back surgery  2013  . TOTAL KNEE ARTHROPLASTY Left 01/18/2013   Procedure: TOTAL KNEE ARTHROPLASTY;  Surgeon: Gearlean Alf, MD;  Location: [facility];  Service: Orthopedics;  Laterality: Left;  . TOTAL KNEE ARTHROPLASTY Right 03/07/2014   Procedure: RIGHT TOTAL KNEE ARTHROPLASTY;  Surgeon: Gearlean Alf, MD;  Location: [facility];  Service: Orthopedics;  Laterality: Right;  . VENTRAL HERNIA REPAIR     Family History  Problem Relation Age of Onset  . Stroke Father 38  . Healthy Mother   . Healthy Son   . Heart disease Sister   . Bipolar disorder Son   . Cancer Sister    Social History   Occupational History  . Not on file.   Social History Main  Topics  . Smoking status: Never Smoker  . Smokeless tobacco: Never Used  . Alcohol use No  . Drug use: No  . Sexual activity: Not Currently    Tobacco Counseling No tobacco use  Activities of Daily Living In your present state of health, do you have any difficulty performing the following activities: 06/07/2016  Hearing? Y  Vision? Y  Difficulty concentrating or making decisions? N  Walking or climbing stairs? N  Dressing or bathing? N  Doing errands, shopping? Y  Preparing Food and eating ? N  Using the Toilet? N  In the past six months, have you accidently leaked urine? Y  Do you have problems with loss of bowel control? N  Managing your Medications? N  Managing your Finances? N  Housekeeping or managing your Housekeeping? N  Some recent data might be hidden    Immunizations and Health Maintenance Immunization History  Administered Date(s) Administered  . Influenza,inj,Quad PF,36+ Mos 07/06/2013, 07/18/2014, 07/07/2015  . Pneumococcal Conjugate-13 09/21/2013  . Pneumococcal Polysaccharide-23 08/19/1995  . Tdap 07/16/2011  . Zoster 07/01/2007   Health Maintenance Due  Topic Date Due  . INFLUENZA VACCINE  04/30/2016  . OPHTHALMOLOGY EXAM  06/08/2016    Patient Care Team: Chipper Herb, MD as PCP - General (Family Medicine) Gaynelle Arabian, MD as Consulting Physician (Orthopedic Surgery) Minus Breeding, MD as Consulting Physician (Cardiology) Irine Seal, MD as Attending Physician (Urology) Sable Feil, MD as Consulting Physician (Gastroenterology)      Assessment:   This is a routine wellness examination for Kristy [name].   Hearing/Vision screen Eye exam scheduled with Dr Tye Savoy for 10/2016. She will request that a report be sent to our office.   She's wears hearing aids and does well with them.  Dietary issues and exercise activities discussed: Exercise limited by: Other - see comments (vision)  Diet-currently eats 2.5 meals a day. Meals are well balanced  and consist of lean proteins, fruits and vegetables.   Goals    . Exercise 150 minutes per week (moderate activity)          Walk for 15 minutes 3 times per week. Increase up to 30 minutes as tolerated.        Depression Screen PHQ 2/9 Scores 06/07/2016 03/29/2016 01/05/2016 10/03/2015 09/27/2015 07/07/2015 03/29/2015  PHQ - 2 Score 0 0 0 0 0 0 0    Fall Risk Fall Risk  06/07/2016 03/29/2016 01/05/2016 10/03/2015 09/27/2015  Falls in the past year? No No No No No  Number falls in past yr: - - - - -  Injury with Fall? - - - - -  Risk for fall due to : - - - - -  Follow up - - - - -    Cognitive Function: MMSE - Mini Mental State Exam 06/07/2016 03/29/2015  Orientation to time 5 5  Orientation to Place 5 5  Registration 3 3  Attention/ Calculation 5 4  Recall 2 3  Language- name 2 objects 2 2  Language- repeat 1 1  Language- follow 3 step command 3 3  Language- read & follow direction 1 1  Write a sentence 1 1  Copy design 0 -  Copy design-comments due to visual impairment -  Total score 28 -    Screening Tests Health Maintenance  Topic Date Due  . INFLUENZA VACCINE  04/30/2016  . OPHTHALMOLOGY EXAM  06/08/2016  . PNA vac Low Risk Adult (2 of 2 - PPSV23) 09/28/2016 (Originally 09/21/2014)  . HEMOGLOBIN A1C  09/26/2016  . FOOT EXAM  03/29/2017  . DEXA SCAN  01/29/2020  . TETANUS/TDAP  07/15/2021  . ZOSTAVAX  Completed      Plan:   Keep regularly scheduled follow up appointment with Dr Laurance Flatten in 07/2016.  During the course of the visit, Kristy [name] was educated and counseled about the following appropriate screening and preventive services:   Vaccines to include Pneumoccal, Td, Zostavax- up to date. Influenza due at the end of September. Appts scheduled.   Electrocardiogram-performed 02/08/15 by Dr Percival Spanish  Cardiovascular disease screening-Sees Dr Percival Spanish  Glaucoma screening-Sees ophthalmologist regularly for macular degeneration  Nutrition counseling  Patient Instructions  (the written plan) were given to the patient.    Chong Sicilian, RN   06/10/2016

## 2016-06-25 ENCOUNTER — Other Ambulatory Visit: Payer: Self-pay | Admitting: Family Medicine

## 2016-07-01 ENCOUNTER — Ambulatory Visit (INDEPENDENT_AMBULATORY_CARE_PROVIDER_SITE_OTHER): Payer: Medicare Other

## 2016-07-01 DIAGNOSIS — Z23 Encounter for immunization: Secondary | ICD-10-CM

## 2016-07-03 ENCOUNTER — Other Ambulatory Visit: Payer: Self-pay | Admitting: Family Medicine

## 2016-07-25 ENCOUNTER — Ambulatory Visit (INDEPENDENT_AMBULATORY_CARE_PROVIDER_SITE_OTHER): Payer: Medicare Other | Admitting: Pediatrics

## 2016-07-25 ENCOUNTER — Encounter: Payer: Self-pay | Admitting: Pediatrics

## 2016-07-25 VITALS — BP 137/75 | HR 80 | Temp 97.9°F | Ht 62.5 in | Wt 172.0 lb

## 2016-07-25 DIAGNOSIS — T148XXA Other injury of unspecified body region, initial encounter: Secondary | ICD-10-CM

## 2016-07-25 DIAGNOSIS — S80922A Unspecified superficial injury of left lower leg, initial encounter: Secondary | ICD-10-CM | POA: Diagnosis not present

## 2016-07-25 NOTE — Progress Notes (Signed)
  Subjective:   Patient ID: Kristy [name], female    DOB: 05-01-1928, 82 y.o.   MRN: [id number] CC: Leg Pain (left)  HPI: Kristy [name] is a 82 y.o. female presenting for Leg Pain (left)  Dropped card table on her L shin 4 days ago Able to weight bear, has had bruising since then Some pain with weight bearing Every once in a while will have sharp shooting pain up leg Doesn't hurt as long as she isnt moving it or standing on it Can move ankle, some pain in bruised area but no ankle pain No redness or swelling around area Not on blood thinners Has taken tylenol a few times  Relevant past medical, surgical, family and social history reviewed. Allergies and medications reviewed and updated. History  Smoking Status  . Never Smoker  Smokeless Tobacco  . Never Used   ROS: Per HPI   Objective:    BP 137/75   Pulse 80   Temp 97.9 F (36.6 C) (Oral)   Ht 5' 2.5" (1.588 m)   Wt 172 lb (78 kg)   BMI 30.96 kg/m   Wt Readings from Last 3 Encounters:  07/25/16 172 lb (78 kg)  06/07/16 167 lb (75.8 kg)  03/29/16 167 lb (75.8 kg)    Gen: NAD, alert, cooperative with exam, NCAT EYES: EOMI, no conjunctival injection, or no icterus CV: DP pulse L side 2+ Neuro: Alert and oriented MSK: no calf tenderness or swelling. Lower legs same size Skin: apprx 10 cm purple bruised area L lower anterior leg over ankle Small amount swelling below bruise, tender to palpation No redness, no induration  Assessment & Plan:  Kristy [name] was seen today for leg pain.  Diagnoses and all orders for this visit:  Bruise Tender over injured area, normal ROM LE Can weight bear on leg No signs of blood clot or infection Discussed resting leg, keeping elevated when she can, tylenol use, let me know if starts feeling worse  Follow up plan: As scheduled Assunta Found, MD [place]

## 2016-07-25 NOTE — Patient Instructions (Signed)
Cold compresses for no more than 5 minutes twice a day as needed Use a tea towel or other cloth between the cold and your skin to prevent skin damage  Take tylenol three times a day  Keep foot elevated

## 2016-08-03 ENCOUNTER — Other Ambulatory Visit: Payer: Self-pay | Admitting: Family Medicine

## 2016-08-05 DIAGNOSIS — H353133 Nonexudative age-related macular degeneration, bilateral, advanced atrophic without subfoveal involvement: Secondary | ICD-10-CM | POA: Diagnosis not present

## 2016-08-05 DIAGNOSIS — H401130 Primary open-angle glaucoma, bilateral, stage unspecified: Secondary | ICD-10-CM | POA: Diagnosis not present

## 2016-08-05 DIAGNOSIS — H43811 Vitreous degeneration, right eye: Secondary | ICD-10-CM | POA: Diagnosis not present

## 2016-08-07 ENCOUNTER — Other Ambulatory Visit (INDEPENDENT_AMBULATORY_CARE_PROVIDER_SITE_OTHER): Payer: Medicare Other

## 2016-08-07 ENCOUNTER — Other Ambulatory Visit: Payer: Self-pay | Admitting: Family Medicine

## 2016-08-07 DIAGNOSIS — N183 Chronic kidney disease, stage 3 (moderate): Secondary | ICD-10-CM | POA: Diagnosis not present

## 2016-08-07 DIAGNOSIS — I10 Essential (primary) hypertension: Secondary | ICD-10-CM | POA: Diagnosis not present

## 2016-08-07 DIAGNOSIS — E559 Vitamin D deficiency, unspecified: Secondary | ICD-10-CM

## 2016-08-07 DIAGNOSIS — R8290 Unspecified abnormal findings in urine: Secondary | ICD-10-CM | POA: Diagnosis not present

## 2016-08-07 DIAGNOSIS — E1169 Type 2 diabetes mellitus with other specified complication: Secondary | ICD-10-CM

## 2016-08-07 DIAGNOSIS — E1122 Type 2 diabetes mellitus with diabetic chronic kidney disease: Secondary | ICD-10-CM | POA: Diagnosis not present

## 2016-08-07 DIAGNOSIS — E785 Hyperlipidemia, unspecified: Secondary | ICD-10-CM

## 2016-08-07 LAB — URINALYSIS, COMPLETE
Bilirubin, UA: NEGATIVE
Glucose, UA: NEGATIVE
KETONES UA: NEGATIVE
Leukocytes, UA: NEGATIVE
NITRITE UA: NEGATIVE
Protein, UA: NEGATIVE
RBC, UA: NEGATIVE
Specific Gravity, UA: 1.02 (ref 1.005–1.030)
Urobilinogen, Ur: 0.2 mg/dL (ref 0.2–1.0)
pH, UA: 5 (ref 5.0–7.5)

## 2016-08-07 LAB — MICROSCOPIC EXAMINATION: Epithelial Cells (non renal): >10 /hpf — AB (ref 0–10)

## 2016-08-07 LAB — BAYER DCA HB A1C WAIVED: HB A1C: 7.1 % — AB (ref <7.0)

## 2016-08-07 NOTE — Telephone Encounter (Signed)
Please call in xanax with 1 refills 

## 2016-08-08 ENCOUNTER — Telehealth: Payer: Self-pay | Admitting: Family Medicine

## 2016-08-08 ENCOUNTER — Other Ambulatory Visit: Payer: Self-pay | Admitting: Family Medicine

## 2016-08-08 LAB — LIPID PANEL
CHOL/HDL RATIO: 2.9 ratio (ref 0.0–4.4)
Cholesterol, Total: 168 mg/dL (ref 100–199)
HDL: 57 mg/dL (ref >39)
LDL Calculated: 77 mg/dL (ref 0–99)
TRIGLYCERIDES: 171 mg/dL — AB (ref 0–149)
VLDL Cholesterol Cal: 34 mg/dL (ref 5–40)

## 2016-08-08 LAB — BMP8+EGFR
BUN / CREAT RATIO: 14 (ref 12–28)
BUN: 11 mg/dL (ref 8–27)
CO2: 22 mmol/L (ref 18–29)
Calcium: 9.7 mg/dL (ref 8.7–10.3)
Chloride: 102 mmol/L (ref 96–106)
Creatinine, Ser: 0.8 mg/dL (ref 0.57–1.00)
GFR, EST AFRICAN AMERICAN: 78 mL/min/{1.73_m2} (ref >59)
GFR, EST NON AFRICAN AMERICAN: 67 mL/min/{1.73_m2} (ref >59)
Glucose: 189 mg/dL — ABNORMAL HIGH (ref 65–99)
Potassium: 4.9 mmol/L (ref 3.5–5.2)
SODIUM: 142 mmol/L (ref 134–144)

## 2016-08-08 LAB — CBC WITH DIFFERENTIAL/PLATELET
BASOS: 0 %
Basophils Absolute: 0 10*3/uL (ref 0.0–0.2)
EOS (ABSOLUTE): 0.1 10*3/uL (ref 0.0–0.4)
EOS: 1 %
HEMATOCRIT: 39.8 % (ref 34.0–46.6)
Hemoglobin: 13.2 g/dL (ref 11.1–15.9)
IMMATURE GRANULOCYTES: 0 %
Immature Grans (Abs): 0 10*3/uL (ref 0.0–0.1)
LYMPHS ABS: 7.4 10*3/uL — AB (ref 0.7–3.1)
Lymphs: 51 %
MCH: 30.1 pg (ref 26.6–33.0)
MCHC: 33.2 g/dL (ref 31.5–35.7)
MCV: 91 fL (ref 79–97)
MONOS ABS: 0.9 10*3/uL (ref 0.1–0.9)
Monocytes: 7 %
NEUTROS ABS: 5.7 10*3/uL (ref 1.4–7.0)
Neutrophils: 41 %
Platelets: 188 10*3/uL (ref 150–379)
RBC: 4.39 x10E6/uL (ref 3.77–5.28)
RDW: 13.8 % (ref 12.3–15.4)
WBC: 14.2 10*3/uL — ABNORMAL HIGH (ref 3.4–10.8)

## 2016-08-08 LAB — HEPATIC FUNCTION PANEL
ALT: 14 IU/L (ref 0–32)
AST: 16 IU/L (ref 0–40)
Albumin: 4.3 g/dL (ref 3.5–4.7)
Alkaline Phosphatase: 56 IU/L (ref 39–117)
BILIRUBIN TOTAL: 0.4 mg/dL (ref 0.0–1.2)
Bilirubin, Direct: 0.16 mg/dL (ref 0.00–0.40)
Total Protein: 6.5 g/dL (ref 6.0–8.5)

## 2016-08-08 LAB — VITAMIN D 25 HYDROXY (VIT D DEFICIENCY, FRACTURES): Vit D, 25-Hydroxy: 42 ng/mL (ref 30.0–100.0)

## 2016-08-08 NOTE — Telephone Encounter (Signed)
Kristy [name] doing now

## 2016-08-08 NOTE — Telephone Encounter (Signed)
Called to [facility] 

## 2016-08-09 LAB — URINE CULTURE

## 2016-08-14 ENCOUNTER — Ambulatory Visit: Payer: Medicare Other | Admitting: Family Medicine

## 2016-08-24 ENCOUNTER — Other Ambulatory Visit: Payer: Self-pay | Admitting: Family Medicine

## 2016-09-02 ENCOUNTER — Other Ambulatory Visit: Payer: Self-pay | Admitting: Family Medicine

## 2016-09-12 ENCOUNTER — Ambulatory Visit (INDEPENDENT_AMBULATORY_CARE_PROVIDER_SITE_OTHER): Payer: Medicare Other | Admitting: Family Medicine

## 2016-09-12 ENCOUNTER — Other Ambulatory Visit: Payer: Self-pay | Admitting: Family Medicine

## 2016-09-12 ENCOUNTER — Encounter: Payer: Self-pay | Admitting: Family Medicine

## 2016-09-12 VITALS — BP 110/60 | HR 78 | Temp 97.8°F | Ht 62.5 in | Wt 168.0 lb

## 2016-09-12 DIAGNOSIS — E1169 Type 2 diabetes mellitus with other specified complication: Secondary | ICD-10-CM

## 2016-09-12 DIAGNOSIS — I10 Essential (primary) hypertension: Secondary | ICD-10-CM

## 2016-09-12 DIAGNOSIS — R195 Other fecal abnormalities: Secondary | ICD-10-CM

## 2016-09-12 DIAGNOSIS — E1122 Type 2 diabetes mellitus with diabetic chronic kidney disease: Secondary | ICD-10-CM | POA: Diagnosis not present

## 2016-09-12 DIAGNOSIS — E559 Vitamin D deficiency, unspecified: Secondary | ICD-10-CM

## 2016-09-12 DIAGNOSIS — N183 Chronic kidney disease, stage 3 (moderate): Secondary | ICD-10-CM | POA: Diagnosis not present

## 2016-09-12 DIAGNOSIS — E785 Hyperlipidemia, unspecified: Secondary | ICD-10-CM | POA: Diagnosis not present

## 2016-09-12 DIAGNOSIS — R42 Dizziness and giddiness: Secondary | ICD-10-CM | POA: Diagnosis not present

## 2016-09-12 NOTE — Patient Instructions (Addendum)
Medicare Annual Wellness Visit  [facility] and the medical providers at [facility] strive to bring you the best medical care.  In doing so we not only want to address your current medical conditions and concerns but also to detect new conditions early and prevent illness, disease and health-related problems.    Medicare offers a yearly Wellness Visit which allows our clinical staff to assess your need for preventative services including immunizations, lifestyle education, counseling to decrease risk of preventable diseases and screening for fall risk and other medical concerns.    This visit is provided free of charge (no copay) for all Medicare recipients. The clinical pharmacists at [facility] have begun to conduct these Wellness Visits which will also include a thorough review of all your medications.    As you primary medical provider recommend that you make an appointment for your Annual Wellness Visit if you have not done so already this year.  You may set up this appointment before you leave today or you may call back [phone]) and schedule an appointment.  Please make sure when you call that you mention that you are scheduling your Annual Wellness Visit with the clinical pharmacist so that the appointment may be made for the proper length of time.     Continue current medications. Continue good therapeutic lifestyle changes which include good diet and exercise. Fall precautions discussed with patient. If an FOBT was given today- please return it to our front desk. If you are over 10 years old - you may need Prevnar 63 or the adult Pneumonia vaccine.  **Flu shots are available--- please call and schedule a FLU-CLINIC appointment**  After your visit with [place] today you will receive a survey in the mail or online from [employer] regarding your care with [place]. Please take a moment to fill this out. Your feedback is very  important to [place] as you can help [place] better understand your patient needs as well as improve your experience and satisfaction. WE CARE ABOUT YOU!!!   Return FOBT We will call with thyroid profile as soon as it becomes available

## 2016-09-12 NOTE — Addendum Note (Signed)
Addended by: Zannie Cove on: 09/12/2016 05:05 PM   Modules accepted: Orders

## 2016-09-12 NOTE — Progress Notes (Signed)
Subjective:    Patient ID: Kristy [name], female    DOB: 06/19/1928, 82 y.o.   MRN: [id number]  HPI Pt here for follow up and management of chronic medical problems which includes diabetes and hyperlipidemia. She is taking medications regularly.The patient's husband who comes with her to this visit and is being seen in a different room and says that the patient has had some dark stools episodes of severe headache and dizziness. She has a history of CLL diabetes mellitus hypertension and hyperlipidemia. She also has a history of SVT. The patient thinks that the problem with her headaches and dizziness is coming from her inability to see well which appears to be getting worse and that she is very stressed with this. She denies any chest pain shortness of breath trouble with her stoma heartburn indigestion nausea vomiting diarrhea or blood in the stool. She says her stools were never blacked they were dark brown in color but they were not black. She is passing her water without problems. She did have blood work done and this is reviewed with her during the visit today and everything was quite good including her cholesterol numbers other than her triglycerides. Her creatinine renal function and liver function were good. She does complain of some fatigue and there was not a thyroid profile so we will get a thyroid profile on the way out today. She does have a history of chronic lymphocytic leukemia and her white blood cell count remains slightly elevated but levels it has been at in the past. The patient says she feels well overall and she looks good and she smiling and in good spirits.     Patient Active Problem List   Diagnosis Date Noted  . Type 2 diabetes mellitus with stage 3 chronic kidney disease ([place]) 07/07/2015  . Type 2 diabetes mellitus with chronic kidney disease and hypertension ([place]) 07/07/2015  . SVT (supraventricular tachycardia) ([place]) 02/08/2015  . Depression 08/24/2013  . Essential  hypertension, benign 06/08/2013  . Hyperlipidemia associated with type 2 diabetes mellitus ([place]) 06/08/2013  . Vitamin D deficiency 06/08/2013  . Diabetes mellitus type 2 controlled 06/08/2013  . OA (osteoarthritis) of knee 01/18/2013  . CLL (chronic lymphocytic leukemia) ([place]) 06/17/2012   Outpatient Encounter Prescriptions as of 09/12/2016  Medication Sig  . ADVOCATE LANCETS MISC Check Blood Sugar bid. Dx E11.9  . ALPRAZolam (XANAX) 0.25 MG tablet TAKE 1/2 TABLET BY MOUTH TWICE A DAY OR 1 AT BEDTIME  . aspirin EC 81 MG tablet Take 81 mg by mouth daily.  . cholecalciferol (VITAMIN D) 1000 UNITS tablet Take 1,000 Units by mouth daily.  Marland Kitchen esomeprazole (NEXIUM) 40 MG capsule Take 1 capsule (40 mg total) by mouth daily. (Patient taking differently: Take 40 mg by mouth as needed (reflux). )  . ezetimibe (ZETIA) 10 MG tablet Take 1 tablet (10 mg total) by mouth daily. as directed  . fenofibrate micronized (LOFIBRA) 134 MG capsule TAKE ONE CAPSULE BY MOUTH AT BEDTIME  . furosemide (LASIX) 40 MG tablet Take 40 mg by mouth 2 (two) times daily as needed for fluid.   Marland Kitchen glimepiride (AMARYL) 4 MG tablet TAKE 1 TABLET TWICE A DAY  . glucose blood (FREESTYLE TEST STRIPS) test strip Check Blood sugar bid. Dx E11.9  . hydroxypropyl methylcellulose (ISOPTO TEARS) 2.5 % ophthalmic solution Place 1 drop into both eyes 2 (two) times daily as needed (dry eyes).  Marland Kitchen lisinopril (PRINIVIL,ZESTRIL) 10 MG tablet TAKE 1/2 TABLET DAILY AS DIRECTED  .  LUMIGAN 0.01 % SOLN   . metFORMIN (GLUCOPHAGE) 1000 MG tablet TAKE 1 TABLET BY MOUTH WITH MEALS BREAKFAST AND SUPPER  . methocarbamol (ROBAXIN) 500 MG tablet Take 1 tablet (500 mg total) by mouth 2 (two) times daily.  . metoprolol succinate (TOPROL-XL) 25 MG 24 hr tablet TAKE 1 TABLET EVERY DAY  . Multiple Vitamins-Minerals (PRESERVISION AREDS 2 PO) Take by mouth.  . Omega-3 Fatty Acids (FISH OIL) 1000 MG CAPS Take 1 capsule by mouth daily.  . pravastatin (PRAVACHOL) 80  MG tablet TAKE ONE TABLET BY MOUTH AT BEDTIME   No facility-administered encounter medications on file as of 09/12/2016.       Review of Systems  Constitutional: Positive for fatigue.  HENT: Negative.   Eyes: Negative.   Respiratory: Negative.   Cardiovascular: Negative.   Gastrointestinal: Negative.   Endocrine: Negative.   Genitourinary: Negative.   Musculoskeletal: Positive for back pain.  Skin: Negative.   Allergic/Immunologic: Negative.   Neurological: Negative.   Hematological: Negative.   Psychiatric/Behavioral: Negative.        Objective:   Physical Exam  Constitutional: She is oriented to person, place, and time. She appears well-developed and well-nourished. No distress.  HENT:  Head: Normocephalic and atraumatic.  Right Ear: External ear normal.  Left Ear: External ear normal.  Nose: Nose normal.  Mouth/Throat: Oropharynx is clear and moist. No oropharyngeal exudate.  Eyes: Conjunctivae and EOM are normal. Pupils are equal, round, and reactive to light. Right eye exhibits no discharge. Left eye exhibits no discharge. No scleral icterus.  Neck: Normal range of motion. Neck supple. No thyromegaly present.  Cardiovascular: Normal rate, regular rhythm, normal heart sounds and intact distal pulses.  Exam reveals no gallop and no friction rub.   No murmur heard. Pulmonary/Chest: Effort normal and breath sounds normal. No respiratory distress. She has no wheezes. She has no rales. She exhibits no tenderness.  Abdominal: Soft. Bowel sounds are normal. She exhibits no mass. There is no tenderness. There is no rebound and no guarding.  Musculoskeletal: Normal range of motion. She exhibits no edema.  Lymphadenopathy:    She has no cervical adenopathy.  Neurological: She is alert and oriented to person, place, and time. She has normal reflexes. No cranial nerve deficit.  Skin: Skin is warm and dry. No rash noted.  Psychiatric: She has a normal mood and affect. Her behavior  is normal. Judgment and thought content normal.  Nursing note and vitals reviewed.  BP 110/60 (BP Location: Left Arm)   Pulse 78   Temp 97.8 F (36.6 C) (Oral)   Ht 5' 2.5" (1.588 m)   Wt 168 lb (76.2 kg)   BMI 30.24 kg/m         Assessment & Plan:  1. Type 2 diabetes mellitus with stage 3 chronic kidney disease, without long-term current use of insulin (HCC) -The patient's A1c was 7.1%. She is working hard to keep her blood sugar down as much as possible  2. Vitamin D deficiency -Vitamin D level was good and she will continue with current treatment  3. Essential hypertension, benign -The blood pressure is good and she will continue with current treatment  4. Hyperlipidemia associated with type 2 diabetes mellitus ([place]) -Cholesterol numbers are good except for elevated triglyceride and she was fasting and she will continue with as aggressive therapeutic lifestyle changes as possible along with current treatment  5. Dizziness and giddiness -The patient takes that this may be associated with her declining vision. I  am tending to agree with that. We told her that if the dizziness and giddiness and headache continue that we will get her to see the neurologist and she will let us know.  6. Dark stools -She indicates the stools were never blacked there were only dark and this has actually improved. -She will be given an FOBT to return.  Patient Instructions                       Medicare Annual Wellness Visit  [facility] and the medical providers at [facility] strive to bring you the best medical care.  In doing so we not only want to address your current medical conditions and concerns but also to detect new conditions early and prevent illness, disease and health-related problems.    Medicare offers a yearly Wellness Visit which allows our clinical staff to assess your need for preventative services including immunizations, lifestyle education, counseling  to decrease risk of preventable diseases and screening for fall risk and other medical concerns.    This visit is provided free of charge (no copay) for all Medicare recipients. The clinical pharmacists at [facility] have begun to conduct these Wellness Visits which will also include a thorough review of all your medications.    As you primary medical provider recommend that you make an appointment for your Annual Wellness Visit if you have not done so already this year.  You may set up this appointment before you leave today or you may call back [phone]) and schedule an appointment.  Please make sure when you call that you mention that you are scheduling your Annual Wellness Visit with the clinical pharmacist so that the appointment may be made for the proper length of time.     Continue current medications. Continue good therapeutic lifestyle changes which include good diet and exercise. Fall precautions discussed with patient. If an FOBT was given today- please return it to our front desk. If you are over 89 years old - you may need Prevnar 52 or the adult Pneumonia vaccine.  **Flu shots are available--- please call and schedule a FLU-CLINIC appointment**  After your visit with [place] today you will receive a survey in the mail or online from [employer] regarding your care with [place]. Please take a moment to fill this out. Your feedback is very important to [place] as you can help [place] better understand your patient needs as well as improve your experience and satisfaction. WE CARE ABOUT YOU!!!   Return FOBT We will call with thyroid profile as soon as it becomes available   Arrie Senate MD

## 2016-09-13 LAB — THYROID PANEL WITH TSH
FREE THYROXINE INDEX: 2.1 (ref 1.2–4.9)
T3 Uptake Ratio: 27 % (ref 24–39)
T4, Total: 7.9 ug/dL (ref 4.5–12.0)
TSH: 1.31 u[IU]/mL (ref 0.450–4.500)

## 2016-09-16 ENCOUNTER — Other Ambulatory Visit: Payer: Medicare Other

## 2016-09-16 DIAGNOSIS — Z1211 Encounter for screening for malignant neoplasm of colon: Secondary | ICD-10-CM | POA: Diagnosis not present

## 2016-09-17 LAB — FECAL OCCULT BLOOD, IMMUNOCHEMICAL: Fecal Occult Bld: NEGATIVE

## 2016-10-31 ENCOUNTER — Other Ambulatory Visit: Payer: Self-pay | Admitting: Family Medicine

## 2016-11-28 ENCOUNTER — Other Ambulatory Visit: Payer: Self-pay | Admitting: Family Medicine

## 2016-12-10 ENCOUNTER — Other Ambulatory Visit: Payer: Self-pay | Admitting: Family Medicine

## 2016-12-26 ENCOUNTER — Other Ambulatory Visit: Payer: Self-pay | Admitting: Family Medicine

## 2016-12-30 DIAGNOSIS — H401133 Primary open-angle glaucoma, bilateral, severe stage: Secondary | ICD-10-CM | POA: Diagnosis not present

## 2017-01-14 ENCOUNTER — Other Ambulatory Visit: Payer: Medicare Other

## 2017-01-14 DIAGNOSIS — E1169 Type 2 diabetes mellitus with other specified complication: Secondary | ICD-10-CM | POA: Diagnosis not present

## 2017-01-14 DIAGNOSIS — E559 Vitamin D deficiency, unspecified: Secondary | ICD-10-CM | POA: Diagnosis not present

## 2017-01-14 DIAGNOSIS — I10 Essential (primary) hypertension: Secondary | ICD-10-CM | POA: Diagnosis not present

## 2017-01-14 DIAGNOSIS — N183 Chronic kidney disease, stage 3 (moderate): Secondary | ICD-10-CM | POA: Diagnosis not present

## 2017-01-14 DIAGNOSIS — E785 Hyperlipidemia, unspecified: Secondary | ICD-10-CM

## 2017-01-14 DIAGNOSIS — E1122 Type 2 diabetes mellitus with diabetic chronic kidney disease: Secondary | ICD-10-CM

## 2017-01-14 DIAGNOSIS — C9110 Chronic lymphocytic leukemia of B-cell type not having achieved remission: Secondary | ICD-10-CM

## 2017-01-14 LAB — BAYER DCA HB A1C WAIVED: HB A1C: 7.2 % — AB (ref <7.0)

## 2017-01-15 LAB — HEPATIC FUNCTION PANEL
ALK PHOS: 57 IU/L (ref 39–117)
ALT: 12 IU/L (ref 0–32)
AST: 13 IU/L (ref 0–40)
Albumin: 4.1 g/dL (ref 3.5–4.7)
BILIRUBIN, DIRECT: 0.19 mg/dL (ref 0.00–0.40)
Bilirubin Total: 0.6 mg/dL (ref 0.0–1.2)
Total Protein: 6.2 g/dL (ref 6.0–8.5)

## 2017-01-15 LAB — CBC WITH DIFFERENTIAL/PLATELET
BASOS: 0 %
Basophils Absolute: 0 10*3/uL (ref 0.0–0.2)
EOS (ABSOLUTE): 0.3 10*3/uL (ref 0.0–0.4)
EOS: 2 %
HEMATOCRIT: 39.8 % (ref 34.0–46.6)
HEMOGLOBIN: 13.3 g/dL (ref 11.1–15.9)
IMMATURE GRANS (ABS): 0 10*3/uL (ref 0.0–0.1)
IMMATURE GRANULOCYTES: 0 %
LYMPHS: 55 %
Lymphocytes Absolute: 8.5 10*3/uL — ABNORMAL HIGH (ref 0.7–3.1)
MCH: 30 pg (ref 26.6–33.0)
MCHC: 33.4 g/dL (ref 31.5–35.7)
MCV: 90 fL (ref 79–97)
MONOCYTES: 6 %
Monocytes Absolute: 0.9 10*3/uL (ref 0.1–0.9)
NEUTROS ABS: 5.7 10*3/uL (ref 1.4–7.0)
NEUTROS PCT: 37 %
PLATELETS: 186 10*3/uL (ref 150–379)
RBC: 4.44 x10E6/uL (ref 3.77–5.28)
RDW: 13.8 % (ref 12.3–15.4)
WBC: 15.5 10*3/uL — ABNORMAL HIGH (ref 3.4–10.8)

## 2017-01-15 LAB — LIPID PANEL
CHOL/HDL RATIO: 3.1 ratio (ref 0.0–4.4)
Cholesterol, Total: 146 mg/dL (ref 100–199)
HDL: 47 mg/dL (ref >39)
LDL Calculated: 57 mg/dL (ref 0–99)
Triglycerides: 211 mg/dL — ABNORMAL HIGH (ref 0–149)
VLDL Cholesterol Cal: 42 mg/dL — ABNORMAL HIGH (ref 5–40)

## 2017-01-15 LAB — BMP8+EGFR
BUN/Creatinine Ratio: 16 (ref 12–28)
BUN: 14 mg/dL (ref 8–27)
CALCIUM: 9.5 mg/dL (ref 8.7–10.3)
CO2: 20 mmol/L (ref 18–29)
CREATININE: 0.86 mg/dL (ref 0.57–1.00)
Chloride: 103 mmol/L (ref 96–106)
GFR, EST AFRICAN AMERICAN: 71 mL/min/{1.73_m2} (ref >59)
GFR, EST NON AFRICAN AMERICAN: 61 mL/min/{1.73_m2} (ref >59)
Glucose: 179 mg/dL — ABNORMAL HIGH (ref 65–99)
Potassium: 4.8 mmol/L (ref 3.5–5.2)
Sodium: 141 mmol/L (ref 134–144)

## 2017-01-15 LAB — VITAMIN D 25 HYDROXY (VIT D DEFICIENCY, FRACTURES): Vit D, 25-Hydroxy: 38.8 ng/mL (ref 30.0–100.0)

## 2017-01-16 ENCOUNTER — Encounter: Payer: Self-pay | Admitting: Family Medicine

## 2017-01-16 ENCOUNTER — Ambulatory Visit (INDEPENDENT_AMBULATORY_CARE_PROVIDER_SITE_OTHER): Payer: Medicare Other | Admitting: Family Medicine

## 2017-01-16 VITALS — BP 119/61 | HR 79 | Temp 97.0°F | Ht 62.5 in | Wt 170.0 lb

## 2017-01-16 DIAGNOSIS — E559 Vitamin D deficiency, unspecified: Secondary | ICD-10-CM

## 2017-01-16 DIAGNOSIS — I10 Essential (primary) hypertension: Secondary | ICD-10-CM

## 2017-01-16 DIAGNOSIS — N183 Chronic kidney disease, stage 3 (moderate): Secondary | ICD-10-CM

## 2017-01-16 DIAGNOSIS — E1122 Type 2 diabetes mellitus with diabetic chronic kidney disease: Secondary | ICD-10-CM | POA: Diagnosis not present

## 2017-01-16 DIAGNOSIS — I471 Supraventricular tachycardia: Secondary | ICD-10-CM | POA: Diagnosis not present

## 2017-01-16 DIAGNOSIS — E1169 Type 2 diabetes mellitus with other specified complication: Secondary | ICD-10-CM

## 2017-01-16 DIAGNOSIS — C9110 Chronic lymphocytic leukemia of B-cell type not having achieved remission: Secondary | ICD-10-CM

## 2017-01-16 DIAGNOSIS — E785 Hyperlipidemia, unspecified: Secondary | ICD-10-CM | POA: Diagnosis not present

## 2017-01-16 DIAGNOSIS — C9190 Lymphoid leukemia, unspecified not having achieved remission: Secondary | ICD-10-CM | POA: Diagnosis not present

## 2017-01-16 DIAGNOSIS — R198 Other specified symptoms and signs involving the digestive system and abdomen: Secondary | ICD-10-CM

## 2017-01-16 DIAGNOSIS — R109 Unspecified abdominal pain: Secondary | ICD-10-CM

## 2017-01-16 NOTE — Progress Notes (Signed)
Subjective:    Patient ID: Kristy [name], female    DOB: 1927/10/04, 83 y.o.   MRN: [id number]  HPI  Pt here for follow up and management of chronic medical problems which includes diabetes, hyperlipidemia and hypertension. She is taking medication regularly.She has had lab work and we will review this with her during the visit today. Her traditional lipid panel had a total cholesterol that was good at 146 with an LDL C cholesterol that was at goal at 70. Her HDL was also good at 47. Her triglycerides however were elevated. She is currently taking fenofibrate and Zetia and pravastatin must do better with her diet. The blood sugar was elevated at 179 and her hemoglobin A1c was 7.2% which is slightly higher than it was 5 months ago. The creatinine, the most important kidney function test remains within normal limits as well as all of the electrolytes including potassium. She has a history of chronic lymphocytic leukemia and the white blood cell count remains stable at 15,500. Hemoglobin is good at 13.3 and stable and the platelet count was adequate. All liver function tests were normal. The vitamin D level was good at 38.8. All this will be reviewed with the patient during the visit and any questions that she may have will be answered. She comes to the visit today with no complaints and requiring no refills. Her weight is up a couple pounds from the last time it is now 170. The patient indicates that she does not think she is taking fenofibrate will have her check with her medicines at home and call us back on that so we can call a prescription in for fenofibrate which was certainly help bring her triglycerides down more. She denies any chest pain or shortness of breath. She did have a fecal occult blood test card done in December and it was negative. She is not seeing any blood in the stool or had any black tarry bowel movements but does complain of loose bowel movements sometimes 2 or 3 daily. She is  passing her water without problems. The patient's blood sugars at home have been running fasting anywhere from 140-150.    Patient Active Problem List   Diagnosis Date Noted  . Type 2 diabetes mellitus with stage 3 chronic kidney disease ([facility]) 07/07/2015  . Type 2 diabetes mellitus with chronic kidney disease and hypertension ([facility]) 07/07/2015  . SVT (supraventricular tachycardia) ([facility]) 02/08/2015  . Depression 08/24/2013  . Essential hypertension, benign 06/08/2013  . Hyperlipidemia associated with type 2 diabetes mellitus ([facility]) 06/08/2013  . Vitamin D deficiency 06/08/2013  . Diabetes mellitus type 2 controlled 06/08/2013  . OA (osteoarthritis) of knee 01/18/2013  . CLL (chronic lymphocytic leukemia) ([facility]) 06/17/2012   Outpatient Encounter Prescriptions as of 01/16/2017  Medication Sig  . ADVOCATE LANCETS MISC Check Blood Sugar bid. Dx E11.9  . ALPRAZolam (XANAX) 0.25 MG tablet TAKE 1/2 TABLET BY MOUTH TWICE A DAY OR 1 AT BEDTIME  . aspirin EC 81 MG tablet Take 81 mg by mouth daily.  . cholecalciferol (VITAMIN D) 1000 UNITS tablet Take 1,000 Units by mouth daily.  Marland Kitchen esomeprazole (NEXIUM) 40 MG capsule Take 1 capsule (40 mg total) by mouth daily. (Patient taking differently: Take 40 mg by mouth as needed (reflux). )  . ezetimibe (ZETIA) 10 MG tablet TAKE 1 TABLET BY MOUTH EVERY DAY  . fenofibrate micronized (LOFIBRA) 134 MG capsule TAKE ONE CAPSULE BY MOUTH AT BEDTIME  . furosemide (LASIX) 40 MG tablet Take  40 mg by mouth 2 (two) times daily as needed for fluid.   Marland Kitchen glimepiride (AMARYL) 4 MG tablet TAKE 1 TABLET TWICE A DAY  . glucose blood (FREESTYLE TEST STRIPS) test strip Check Blood sugar bid. Dx E11.9  . hydroxypropyl methylcellulose (ISOPTO TEARS) 2.5 % ophthalmic solution Place 1 drop into both eyes 2 (two) times daily as needed (dry eyes).  Marland Kitchen lisinopril (PRINIVIL,ZESTRIL) 10 MG tablet TAKE 1/2 TABLET DAILY AS DIRECTED  . LUMIGAN 0.01 % SOLN   . metFORMIN (GLUCOPHAGE) 1000 MG  tablet TAKE 1 TABLET BY MOUTH WITH MEALS BREAKFAST AND SUPPER  . methocarbamol (ROBAXIN) 500 MG tablet Take 1 tablet (500 mg total) by mouth 2 (two) times daily.  . metoprolol succinate (TOPROL-XL) 25 MG 24 hr tablet TAKE 1 TABLET BY MOUTH EVERY DAY  . Multiple Vitamins-Minerals (PRESERVISION AREDS 2 PO) Take by mouth.  . Omega-3 Fatty Acids (FISH OIL) 1000 MG CAPS Take 1 capsule by mouth daily.  . pravastatin (PRAVACHOL) 80 MG tablet TAKE ONE TABLET BY MOUTH AT BEDTIME   No facility-administered encounter medications on file as of 01/16/2017.      Review of Systems  Constitutional: Negative.   HENT: Negative.   Eyes: Negative.   Respiratory: Negative.   Cardiovascular: Negative.   Gastrointestinal: Negative.   Endocrine: Negative.   Genitourinary: Negative.   Musculoskeletal: Negative.   Skin: Negative.   Allergic/Immunologic: Negative.   Neurological: Negative.   Hematological: Negative.   Psychiatric/Behavioral: Negative.        Objective:   Physical Exam  Constitutional: She is oriented to person, place, and time. She appears well-developed and well-nourished. No distress.  Patient is pleasant and alert  HENT:  Head: Normocephalic and atraumatic.  Right Ear: External ear normal.  Left Ear: External ear normal.  Nose: Nose normal.  Mouth/Throat: Oropharynx is clear and moist. No oropharyngeal exudate.  Eyes: Conjunctivae and EOM are normal. Pupils are equal, round, and reactive to light. Right eye exhibits no discharge. Left eye exhibits no discharge. No scleral icterus.  She will be seeing the eye doctor soon  Neck: Normal range of motion. Neck supple. No JVD present. No thyromegaly present.  Cardiovascular: Normal rate, regular rhythm, normal heart sounds and intact distal pulses.   No murmur heard. The heart has a regular rate and rhythm at 72/m  Pulmonary/Chest: Effort normal and breath sounds normal. No respiratory distress. She has no wheezes. She has no rales.    Clear anteriorly and posteriorly  Abdominal: Soft. Bowel sounds are normal. She exhibits no mass. There is no tenderness. There is no rebound and no guarding.  Midline scar with no liver or spleen enlargement and no masses and no bruits and no inguinal adenopathy  Musculoskeletal: She exhibits no edema.  Hesitant movements because of her chronic back pain  Lymphadenopathy:    She has no cervical adenopathy.  Neurological: She is alert and oriented to person, place, and time. She has normal reflexes. No cranial nerve deficit.  Skin: Skin is warm and dry. No rash noted.  Psychiatric: She has a normal mood and affect. Her behavior is normal. Judgment and thought content normal.  Nursing note and vitals reviewed.   BP 119/61 (BP Location: Right Arm)   Pulse 79   Temp 97 F (36.1 C) (Oral)   Ht 5' 2.5" (1.588 m)   Wt 170 lb (77.1 kg)   BMI 30.60 kg/m        Assessment & Plan:  1. Type 2  diabetes mellitus with stage 3 chronic kidney disease, without long-term current use of insulin (HCC) -Continue as aggressive therapeutic lifestyle changes as possible - Microalbumin / creatinine urine ratio  2. Vitamin D deficiency -Continue vitamin D  3. Hyperlipidemia associated with type 2 diabetes mellitus ([facility]) -Continue pravastatin but we most likely will have to add back the fenofibrate if she is not already taking this. She will call back and confirm this with the nurse.  4. Essential hypertension, benign -The blood pressure is good today and she will continue with current treatment  5. CLL (chronic lymphocytic leukemia) (HCC) -We will continue to monitor the white blood cell count at each visit  6. SVT (supraventricular tachycardia) (HCC) -No complaints with this today.  7. Abnormal bowel habits -The patient has a couple of loose bowel movements a day and has had some periodic left lower quadrant abdominal soreness. - US Abdomen Complete; Future  8. Abdominal pain, unspecified  abdominal location - US Abdomen Complete; Future  Patient Instructions                       Medicare Annual Wellness Visit  [facility] and the medical providers at [facility] strive to bring you the best medical care.  In doing so we not only want to address your current medical conditions and concerns but also to detect new conditions early and prevent illness, disease and health-related problems.    Medicare offers a yearly Wellness Visit which allows our clinical staff to assess your need for preventative services including immunizations, lifestyle education, counseling to decrease risk of preventable diseases and screening for fall risk and other medical concerns.    This visit is provided free of charge (no copay) for all Medicare recipients. The clinical pharmacists at [facility] have begun to conduct these Wellness Visits which will also include a thorough review of all your medications.    As you primary medical provider recommend that you make an appointment for your Annual Wellness Visit if you have not done so already this year.  You may set up this appointment before you leave today or you may call back ([phone]) and schedule an appointment.  Please make sure when you call that you mention that you are scheduling your Annual Wellness Visit with the clinical pharmacist so that the appointment may be made for the proper length of time.     Continue current medications. Continue good therapeutic lifestyle changes which include good diet and exercise. Fall precautions discussed with patient. If an FOBT was given today- please return it to our front desk. If you are over 88 years old - you may need Prevnar 49 or the adult Pneumonia vaccine.  **Flu shots are available--- please call and schedule a FLU-CLINIC appointment**  After your visit with [place] today you will receive a survey in the mail or online from [employer] regarding your care  with [place]. Please take a moment to fill this out. Your feedback is very important to [place] as you can help [place] better understand your patient needs as well as improve your experience and satisfaction. WE CARE ABOUT YOU!!!   The patient will continue with as aggressive therapeutic lifestyle changes as possible. Patient will purchase a probiotic or the generic equivalent of that called align and take one daily She will return the FOBT She will continue to drink plenty of fluids and reduce her caffeine intake as much as possible She should  check some fasting blood sugars periodically as well as those during the day She should call the nurse back and confirm whether or not she is taking fenofibrate and if she isn't we will restart this to help bring her triglycerides down some more.    Arrie Senate MD

## 2017-01-16 NOTE — Patient Instructions (Addendum)
Medicare Annual Wellness Visit  [facility] and the medical providers at [facility] strive to bring you the best medical care.  In doing so we not only want to address your current medical conditions and concerns but also to detect new conditions early and prevent illness, disease and health-related problems.    Medicare offers a yearly Wellness Visit which allows our clinical staff to assess your need for preventative services including immunizations, lifestyle education, counseling to decrease risk of preventable diseases and screening for fall risk and other medical concerns.    This visit is provided free of charge (no copay) for all Medicare recipients. The clinical pharmacists at [facility] have begun to conduct these Wellness Visits which will also include a thorough review of all your medications.    As you primary medical provider recommend that you make an appointment for your Annual Wellness Visit if you have not done so already this year.  You may set up this appointment before you leave today or you may call back ([phone]) and schedule an appointment.  Please make sure when you call that you mention that you are scheduling your Annual Wellness Visit with the clinical pharmacist so that the appointment may be made for the proper length of time.     Continue current medications. Continue good therapeutic lifestyle changes which include good diet and exercise. Fall precautions discussed with patient. If an FOBT was given today- please return it to our front desk. If you are over 53 years old - you may need Prevnar 53 or the adult Pneumonia vaccine.  **Flu shots are available--- please call and schedule a FLU-CLINIC appointment**  After your visit with [place] today you will receive a survey in the mail or online from [employer] regarding your care with [place]. Please take a moment to fill this out. Your feedback is very  important to [place] as you can help [place] better understand your patient needs as well as improve your experience and satisfaction. WE CARE ABOUT YOU!!!   The patient will continue with as aggressive therapeutic lifestyle changes as possible. Patient will purchase a probiotic or the generic equivalent of that called align and take one daily She will return the FOBT She will continue to drink plenty of fluids and reduce her caffeine intake as much as possible She should check some fasting blood sugars periodically as well as those during the day She should call the nurse back and confirm whether or not she is taking fenofibrate and if she isn't we will restart this to help bring her triglycerides down some more.

## 2017-01-21 ENCOUNTER — Other Ambulatory Visit: Payer: Self-pay | Admitting: Family Medicine

## 2017-01-22 ENCOUNTER — Other Ambulatory Visit: Payer: Self-pay | Admitting: Family Medicine

## 2017-01-22 LAB — MICROALBUMIN / CREATININE URINE RATIO
Creatinine, Urine: 137.5 mg/dL
MICROALB/CREAT RATIO: 26 mg/g{creat} (ref 0.0–30.0)
Microalbumin, Urine: 35.8 ug/mL

## 2017-01-23 ENCOUNTER — Ambulatory Visit (HOSPITAL_COMMUNITY)
Admission: RE | Admit: 2017-01-23 | Discharge: 2017-01-23 | Disposition: A | Discharge status: Home / Self Care | Payer: Medicare Other | Source: Ambulatory Visit | Attending: Family Medicine | Admitting: Family Medicine

## 2017-01-23 DIAGNOSIS — R109 Unspecified abdominal pain: Secondary | ICD-10-CM | POA: Insufficient documentation

## 2017-01-23 DIAGNOSIS — R198 Other specified symptoms and signs involving the digestive system and abdomen: Secondary | ICD-10-CM

## 2017-01-24 MED ORDER — FENOFIBRATE MICRONIZED 134 MG PO CAPS: 134.0000 mg | ORAL_CAPSULE | Freq: Every day | ORAL | 5 refills | Status: DC

## 2017-01-24 NOTE — Addendum Note (Signed)
Addended byCarrolyn Leigh on: 01/24/2017 10:07 AM   Modules accepted: Orders

## 2017-02-10 DIAGNOSIS — H401133 Primary open-angle glaucoma, bilateral, severe stage: Secondary | ICD-10-CM | POA: Diagnosis not present

## 2017-02-27 IMAGING — CR DG HIP (WITH OR WITHOUT PELVIS) 2-3V*R*
2 series · 2 of 2 positions shown · non-contrast
Comparison: 01/29/2012

CLINICAL DATA: Fall today

EXAM:
RIGHT HIP (WITH PELVIS) 2-3 VIEWS

[view not recorded (1 of 2)]
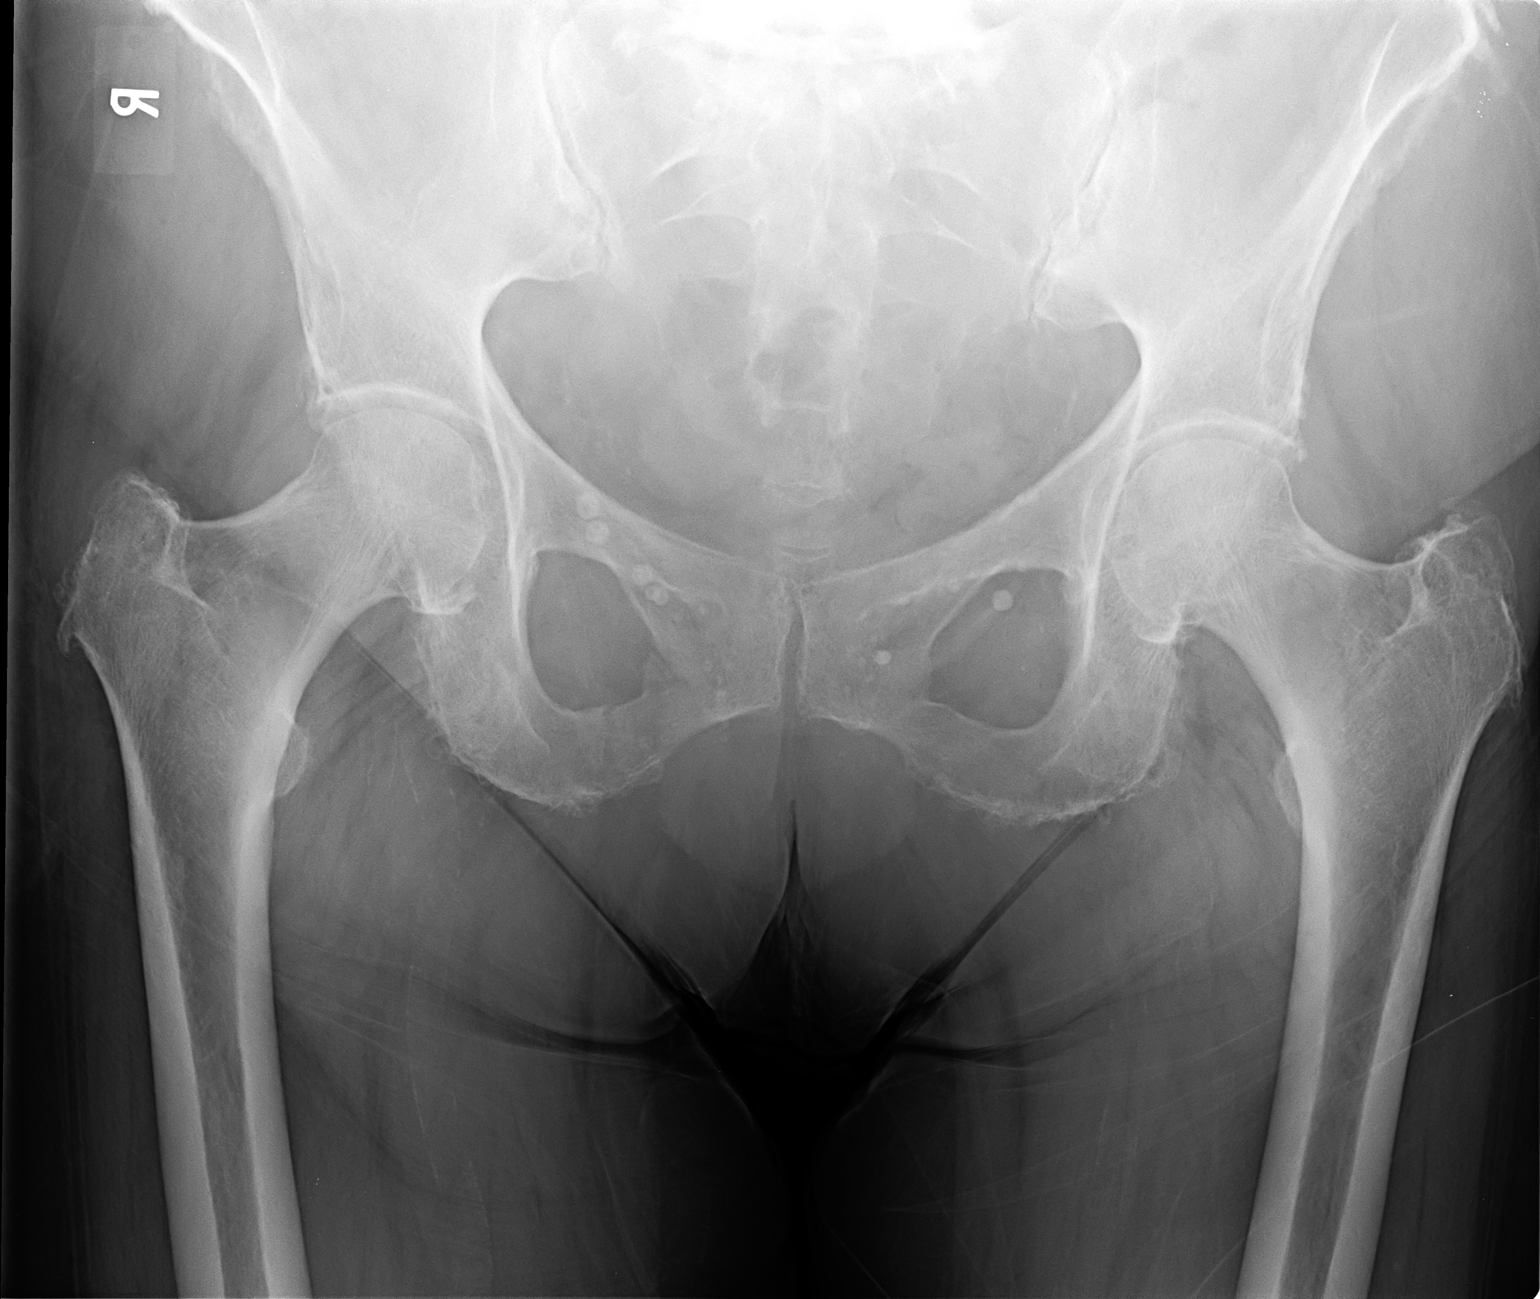

[view not recorded (2 of 2)]
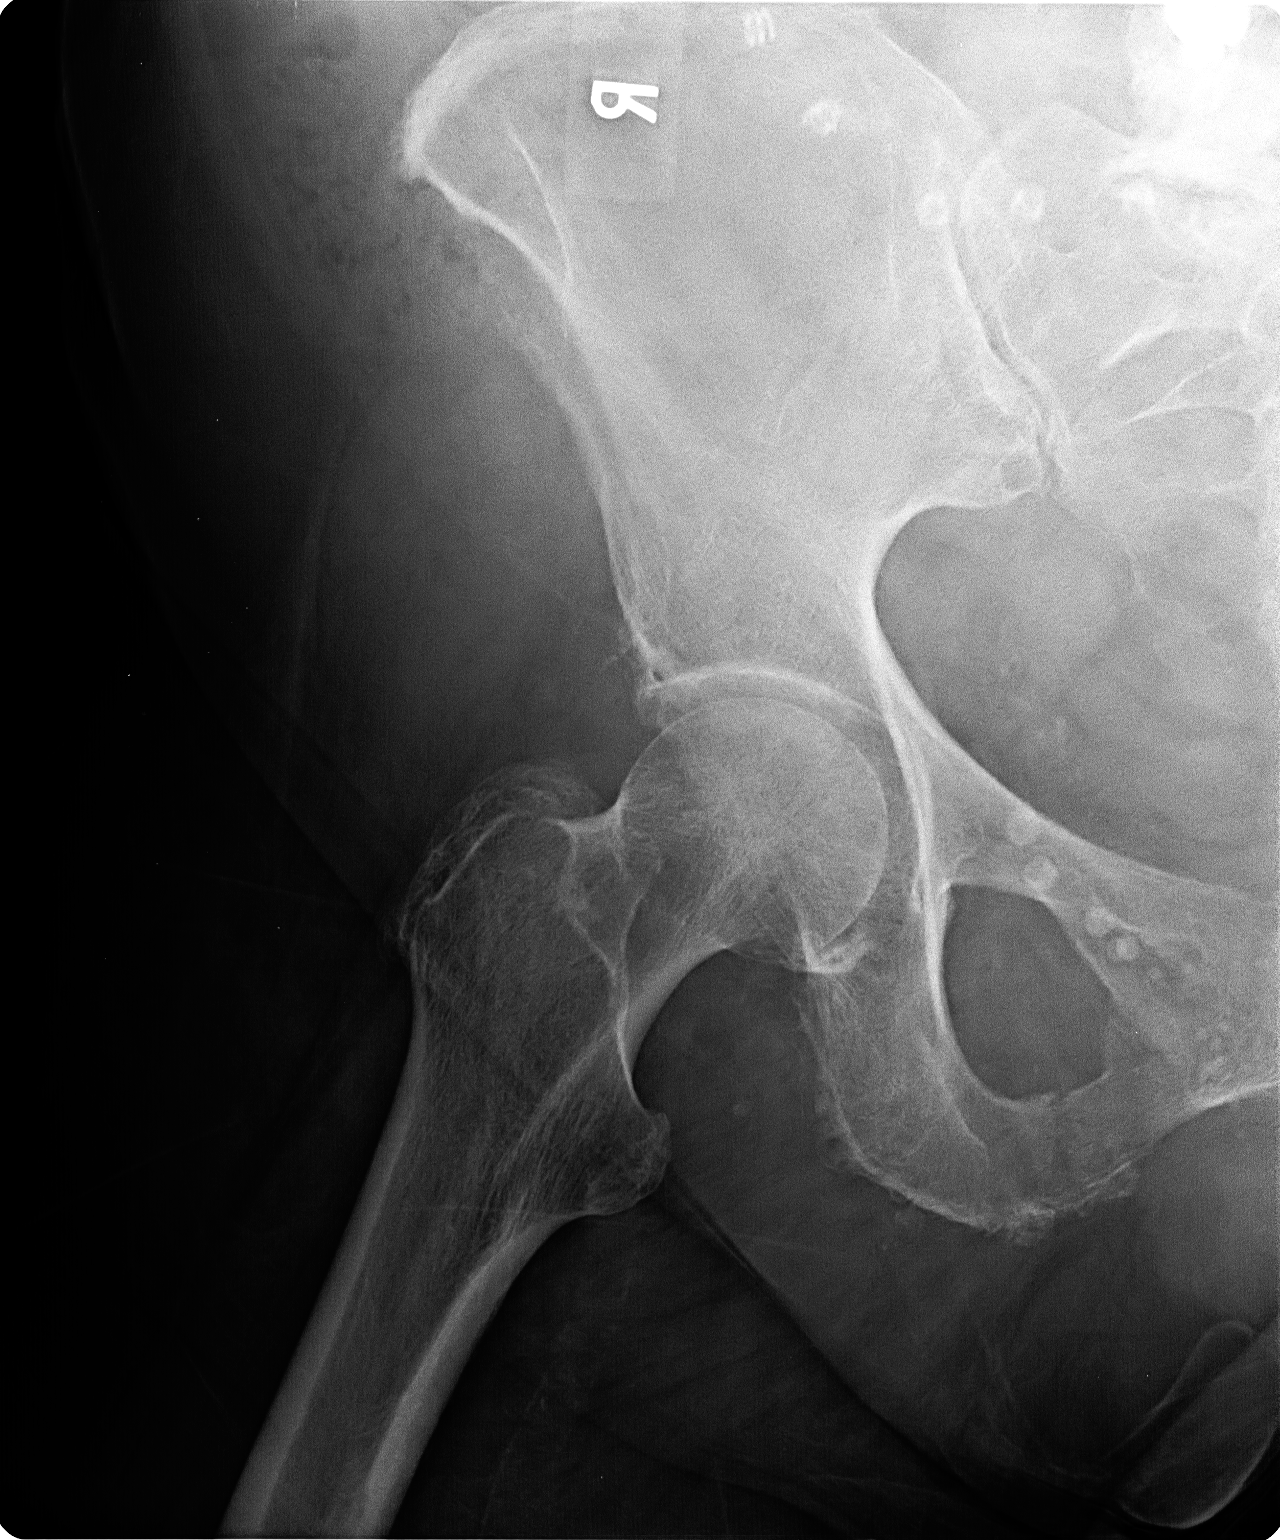

[2 of 2 positions shown; findings below may reference images not displayed]

FINDINGS: Right hip joint is normal.  Negative for osteoarthritis or AVN.

Nondisplaced fracture inferior pubic ramus not present on the prior
study. This appears acute. No other acute fracture.
IMPRESSION: Nondisplaced fracture right inferior pubic ramus.

These results will be called to the ordering clinician or
representative by the Radiologist Assistant, and communication
documented in the PACS or zVision Dashboard.

## 2017-03-07 DIAGNOSIS — H353134 Nonexudative age-related macular degeneration, bilateral, advanced atrophic with subfoveal involvement: Secondary | ICD-10-CM | POA: Diagnosis not present

## 2017-03-07 DIAGNOSIS — H43811 Vitreous degeneration, right eye: Secondary | ICD-10-CM | POA: Diagnosis not present

## 2017-03-11 ENCOUNTER — Ambulatory Visit (INDEPENDENT_AMBULATORY_CARE_PROVIDER_SITE_OTHER): Payer: Medicare Other

## 2017-03-11 ENCOUNTER — Ambulatory Visit (INDEPENDENT_AMBULATORY_CARE_PROVIDER_SITE_OTHER): Payer: Medicare Other | Admitting: Family Medicine

## 2017-03-11 ENCOUNTER — Encounter: Payer: Self-pay | Admitting: Family Medicine

## 2017-03-11 VITALS — BP 142/75 | HR 62 | Temp 96.9°F | Ht 62.5 in | Wt 151.4 lb

## 2017-03-11 DIAGNOSIS — M25551 Pain in right hip: Secondary | ICD-10-CM | POA: Diagnosis not present

## 2017-03-11 DIAGNOSIS — M7061 Trochanteric bursitis, right hip: Secondary | ICD-10-CM | POA: Diagnosis not present

## 2017-03-11 NOTE — Progress Notes (Signed)
   HPI  Patient presents today here in lateral hip pain.  Patient had pain for about one week, last Friday she fell while stepping out of her car landing on her lateral hip causing more pain. She states that the pain is tolerable but she was worried about blood clots.  As a young lady she had multiple episodes of blood clots in her stool, this has not recurred in several years, this has not recurred recently.  She states that it hurts to walk, however the pain is bearable. She has been taking Tylenol 1 pill with some good effect. She has not tried ice.  PMH: Smoking status noted ROS: Per HPI  Objective: BP (!) 142/75   Pulse 62   Temp (!) 96.9 F (36.1 C) (Oral)   Ht 5' 2.5" (1.588 m)   Wt 151 lb 6.4 oz (68.7 kg)   BMI 27.25 kg/m  Gen: NAD, alert, cooperative with exam HEENT: NCAT CV: RRR, good S1/S2, no murmur Resp: CTABL, no wheezes, non-labored Ext: No edema, warm Neuro: Alert and oriented, No gross deficits  Assessment and plan:  # Right hip pain, greater trochanteric bursitis Patient with signs of greater trochanteric bursitis, however after a fall in think it's most prudent to go ahead and rule out fracture with an x-ray as well. X-ray pending. Tylenol, ice. Handout given from sports medicine patient advisor.  My personal review of x-ray: Healing right pelvic rami fracture, no acute findings in the right hip    Orders Placed This Encounter  Procedures  . DG HIP UNILAT W OR W/O PELVIS 2-3 VIEWS RIGHT    Standing Status:   Future    Number of Occurrences:   1    Standing Expiration Date:   05/10/2018    Order Specific Question:   Reason for Exam (SYMPTOM  OR DIAGNOSIS REQUIRED)    Answer:   R hip pain after fall    Order Specific Question:   Preferred imaging location?    Answer:   Internal     Laroy Apple, MD [facility] 03/11/2017, 5:09 PM

## 2017-03-11 NOTE — Patient Instructions (Addendum)
Great to see you!  Come back with any concerns  See the handout I gave you from the Sports Medicine Patient Advisor.

## 2017-03-13 ENCOUNTER — Other Ambulatory Visit: Payer: Self-pay | Admitting: Family Medicine

## 2017-03-27 ENCOUNTER — Telehealth: Payer: Self-pay | Admitting: Family Medicine

## 2017-04-25 ENCOUNTER — Ambulatory Visit (INDEPENDENT_AMBULATORY_CARE_PROVIDER_SITE_OTHER): Payer: Medicare Other | Admitting: Physician Assistant

## 2017-04-25 ENCOUNTER — Encounter: Payer: Self-pay | Admitting: Physician Assistant

## 2017-04-25 VITALS — BP 98/54 | HR 70 | Temp 97.0°F | Ht 62.5 in | Wt 149.0 lb

## 2017-04-25 DIAGNOSIS — N3000 Acute cystitis without hematuria: Secondary | ICD-10-CM

## 2017-04-25 DIAGNOSIS — R300 Dysuria: Secondary | ICD-10-CM

## 2017-04-25 MED ORDER — SULFAMETHOXAZOLE-TRIMETHOPRIM 800-160 MG PO TABS: 1.0000 | ORAL_TABLET | Freq: Two times a day (BID) | ORAL | 0 refills | Status: DC

## 2017-04-25 NOTE — Patient Instructions (Signed)
In a few days you may receive a survey in the mail or online from [employer] regarding your visit with us today. Please take a moment to fill this out. Your feedback is very important to our whole office. It can help us better understand your needs as well as improve your experience and satisfaction. Thank you for taking your time to complete it. We care about you.  Barett Whidbee, PA-C  

## 2017-04-27 NOTE — Progress Notes (Signed)
BP (!) 98/54   Pulse 70   Temp (!) 97 F (36.1 C) (Oral)   Ht 5' 2.5" (1.588 m)   Wt 149 lb (67.6 kg)   BMI 26.82 kg/m    Subjective:    Patient ID: Kristy [name], female    DOB: [birth year], 83 y.o.   MRN: [id number]  HPI: Kristy [name] is a 83 y.o. female presenting on 04/25/2017 for Urinary Frequency  This patient has had several days of dysuria, frequency and nocturia. There is also pain over the bladder in the suprapubic region, no back pain. Denies leakage or hematuria.  Denies fever or chills. No pain in flank area.   Relevant past medical, surgical, family and social history reviewed and updated as indicated. Allergies and medications reviewed and updated.  Past Medical History:  Diagnosis Date  . Anxiety   . Colon polyps   . Complication of anesthesia 01-18-2013   slow to awaken and felt crazy for 3-4 days  . Diabetes mellitus   . Diverticulitis   . H/O hiatal hernia   . Hearing loss 01-07-13   bilateral hearing aids  . History of shingles 01-07-13   3'14 recent outbreak- drying in   . Hypercholesteremia   . Hypertension    Dr. Laurance Flatten  . Lumbar spondylosis    osteoarthritis-knees  . Macular degeneration of both eyes    and dry eyes    Past Surgical History:  Procedure Laterality Date  . ABDOMINAL HYSTERECTOMY  1987  . CATARACT EXTRACTION    . CHOLECYSTECTOMY  1989   open  . KNEE ARTHROSCOPY  01-07-13   left knee- many yrs ago  . Lumbar back surgery  2013  . TOTAL KNEE ARTHROPLASTY Left 01/18/2013   Procedure: TOTAL KNEE ARTHROPLASTY;  Surgeon: Gearlean Alf, MD;  Location: [facility];  Service: Orthopedics;  Laterality: Left;  . TOTAL KNEE ARTHROPLASTY Right 03/07/2014   Procedure: RIGHT TOTAL KNEE ARTHROPLASTY;  Surgeon: Gearlean Alf, MD;  Location: [facility];  Service: Orthopedics;  Laterality: Right;  . VENTRAL HERNIA REPAIR      Review of Systems  Constitutional: Negative.   HENT: Negative.   Eyes: Negative.   Respiratory: Negative.     Gastrointestinal: Negative.   Genitourinary: Positive for difficulty urinating, dysuria and urgency. Negative for flank pain.    Allergies as of 04/25/2017      Reactions   Vioxx [rofecoxib] Other (See Comments)   Ulcers in mouth      Medication List       Accurate as of 04/25/17 11:59 PM. Always use your most recent med list.          ADVOCATE LANCETS Misc Check Blood Sugar bid. Dx E11.9   ALPRAZolam 0.25 MG tablet Commonly known as:  XANAX TAKE 1/2 TABLET BY MOUTH TWICE A DAY OR 1 AT BEDTIME   aspirin EC 81 MG tablet Take 81 mg by mouth daily.   cholecalciferol 1000 units tablet Commonly known as:  VITAMIN D Take 1,000 Units by mouth daily.   esomeprazole 40 MG capsule Commonly known as:  NEXIUM Take 1 capsule (40 mg total) by mouth daily.   Fish Oil 1000 MG Caps Take 1 capsule by mouth daily.   furosemide 40 MG tablet Commonly known as:  LASIX Take 40 mg by mouth 2 (two) times daily as needed for fluid.   glimepiride 4 MG tablet Commonly known as:  AMARYL TAKE 1 TABLET TWICE A DAY   glucose blood  test strip Commonly known as:  FREESTYLE TEST STRIPS Check Blood sugar bid. Dx E11.9   hydroxypropyl methylcellulose / hypromellose 2.5 % ophthalmic solution Commonly known as:  ISOPTO TEARS / GONIOVISC Place 1 drop into both eyes 2 (two) times daily as needed (dry eyes).   lisinopril 10 MG tablet Commonly known as:  PRINIVIL,ZESTRIL TAKE 1/2 TABLET DAILY AS DIRECTED   LUMIGAN 0.01 % Soln Generic drug:  bimatoprost   metFORMIN 1000 MG tablet Commonly known as:  GLUCOPHAGE TAKE 1 TABLET BY MOUTH WITH MEALS BREAKFAST AND SUPPER   methocarbamol 500 MG tablet Commonly known as:  ROBAXIN Take 1 tablet (500 mg total) by mouth 2 (two) times daily.   metoprolol succinate 25 MG 24 hr tablet Commonly known as:  TOPROL-XL TAKE 1 TABLET BY MOUTH EVERY DAY   pravastatin 80 MG tablet Commonly known as:  PRAVACHOL TAKE ONE TABLET BY MOUTH AT BEDTIME    PRESERVISION AREDS 2 PO Take by mouth.   sulfamethoxazole-trimethoprim 800-160 MG tablet Commonly known as:  BACTRIM DS,SEPTRA DS Take 1 tablet by mouth 2 (two) times daily.          Objective:    BP (!) 98/54   Pulse 70   Temp (!) 97 F (36.1 C) (Oral)   Ht 5' 2.5" (1.588 m)   Wt 149 lb (67.6 kg)   BMI 26.82 kg/m   Allergies  Allergen Reactions  . Vioxx [Rofecoxib] Other (See Comments)    Ulcers in mouth     Physical Exam  Constitutional: She is oriented to person, place, and time. She appears well-developed and well-nourished.  HENT:  Head: Normocephalic and atraumatic.  Eyes: Pupils are equal, round, and reactive to light. Conjunctivae are normal.  Cardiovascular: Normal rate, regular rhythm, normal heart sounds and intact distal pulses.   Pulmonary/Chest: Effort normal and breath sounds normal.  Abdominal: Soft. Bowel sounds are normal. She exhibits no distension and no mass. There is tenderness in the suprapubic area. There is no rebound, no guarding and no CVA tenderness.  Neurological: She is alert and oriented to person, place, and time. She has normal reflexes.  Skin: Skin is warm and dry. No rash noted.  Psychiatric: She has a normal mood and affect. Her behavior is normal. Judgment and thought content normal.    Results for orders placed or performed in visit on 01/16/17  Microalbumin / creatinine urine ratio  Result Value Ref Range   Creatinine, Urine 137.5 Not Estab. mg/dL   Albumin, Urine 35.8 Not Estab. ug/mL   Microalb/Creat Ratio 26.0 0.0 - 30.0 mg/g creat      Assessment & Plan:   1. Dysuria - Urine Culture - Urinalysis, Complete  2. Acute cystitis without hematuria - sulfamethoxazole-trimethoprim (BACTRIM DS,SEPTRA DS) 800-160 MG tablet; Take 1 tablet by mouth 2 (two) times daily.  Dispense: 20 tablet; Refill: 0 IF YEAST BECOMES BOTHERSOME, please call for diflucan.   Current Outpatient Prescriptions:  .  ADVOCATE LANCETS MISC, Check  Blood Sugar bid. Dx E11.9, Disp: 100 each, Rfl: 2 .  ALPRAZolam (XANAX) 0.25 MG tablet, TAKE 1/2 TABLET BY MOUTH TWICE A DAY OR 1 AT BEDTIME, Disp: 90 tablet, Rfl: 1 .  aspirin EC 81 MG tablet, Take 81 mg by mouth daily., Disp: , Rfl:  .  cholecalciferol (VITAMIN D) 1000 UNITS tablet, Take 1,000 Units by mouth daily., Disp: , Rfl:  .  esomeprazole (NEXIUM) 40 MG capsule, Take 1 capsule (40 mg total) by mouth daily. (Patient taking differently:  Take 40 mg by mouth as needed (reflux). ), Disp: 30 capsule, Rfl: 3 .  furosemide (LASIX) 40 MG tablet, Take 40 mg by mouth 2 (two) times daily as needed for fluid. , Disp: , Rfl:  .  glimepiride (AMARYL) 4 MG tablet, TAKE 1 TABLET TWICE A DAY, Disp: 60 tablet, Rfl: 3 .  glucose blood (FREESTYLE TEST STRIPS) test strip, Check Blood sugar bid. Dx E11.9, Disp: 300 each, Rfl: 2 .  hydroxypropyl methylcellulose (ISOPTO TEARS) 2.5 % ophthalmic solution, Place 1 drop into both eyes 2 (two) times daily as needed (dry eyes)., Disp: , Rfl:  .  lisinopril (PRINIVIL,ZESTRIL) 10 MG tablet, TAKE 1/2 TABLET DAILY AS DIRECTED, Disp: 30 tablet, Rfl: 3 .  LUMIGAN 0.01 % SOLN, , Disp: , Rfl:  .  metFORMIN (GLUCOPHAGE) 1000 MG tablet, TAKE 1 TABLET BY MOUTH WITH MEALS BREAKFAST AND SUPPER, Disp: 60 tablet, Rfl: 11 .  methocarbamol (ROBAXIN) 500 MG tablet, Take 1 tablet (500 mg total) by mouth 2 (two) times daily., Disp: 30 tablet, Rfl: 0 .  metoprolol succinate (TOPROL-XL) 25 MG 24 hr tablet, TAKE 1 TABLET BY MOUTH EVERY DAY, Disp: 30 tablet, Rfl: 5 .  Multiple Vitamins-Minerals (PRESERVISION AREDS 2 PO), Take by mouth., Disp: , Rfl:  .  Omega-3 Fatty Acids (FISH OIL) 1000 MG CAPS, Take 1 capsule by mouth daily., Disp: , Rfl:  .  pravastatin (PRAVACHOL) 80 MG tablet, TAKE ONE TABLET BY MOUTH AT BEDTIME, Disp: 90 tablet, Rfl: 1 .  sulfamethoxazole-trimethoprim (BACTRIM DS,SEPTRA DS) 800-160 MG tablet, Take 1 tablet by mouth 2 (two) times daily., Disp: 20 tablet, Rfl:  0  Continue all other maintenance medications as listed above.  Follow up plan: Return if symptoms worsen or fail to improve.  Educational handout given for uti  Terald Sleeper PA-C [facility] [address]  [place], [place] [address] [phone]   04/27/2017, 10:27 PM

## 2017-04-28 LAB — URINALYSIS, COMPLETE
Bilirubin, UA: NEGATIVE
NITRITE UA: NEGATIVE
UUROB: 0.2 mg/dL (ref 0.2–1.0)
pH, UA: 5 (ref 5.0–7.5)

## 2017-04-28 LAB — MICROSCOPIC EXAMINATION
RBC, UA: NONE SEEN /hpf (ref 0–?)
Renal Epithel, UA: NONE SEEN /hpf

## 2017-05-09 IMAGING — CR DG PELVIS 1-2V
1 series · 1 of 1 positions shown · non-contrast
Comparison: 01/03/2015.

CLINICAL DATA: Pelvic fracture follow-up.

EXAM:
PELVIS - 1-2 VIEW

[view not recorded]
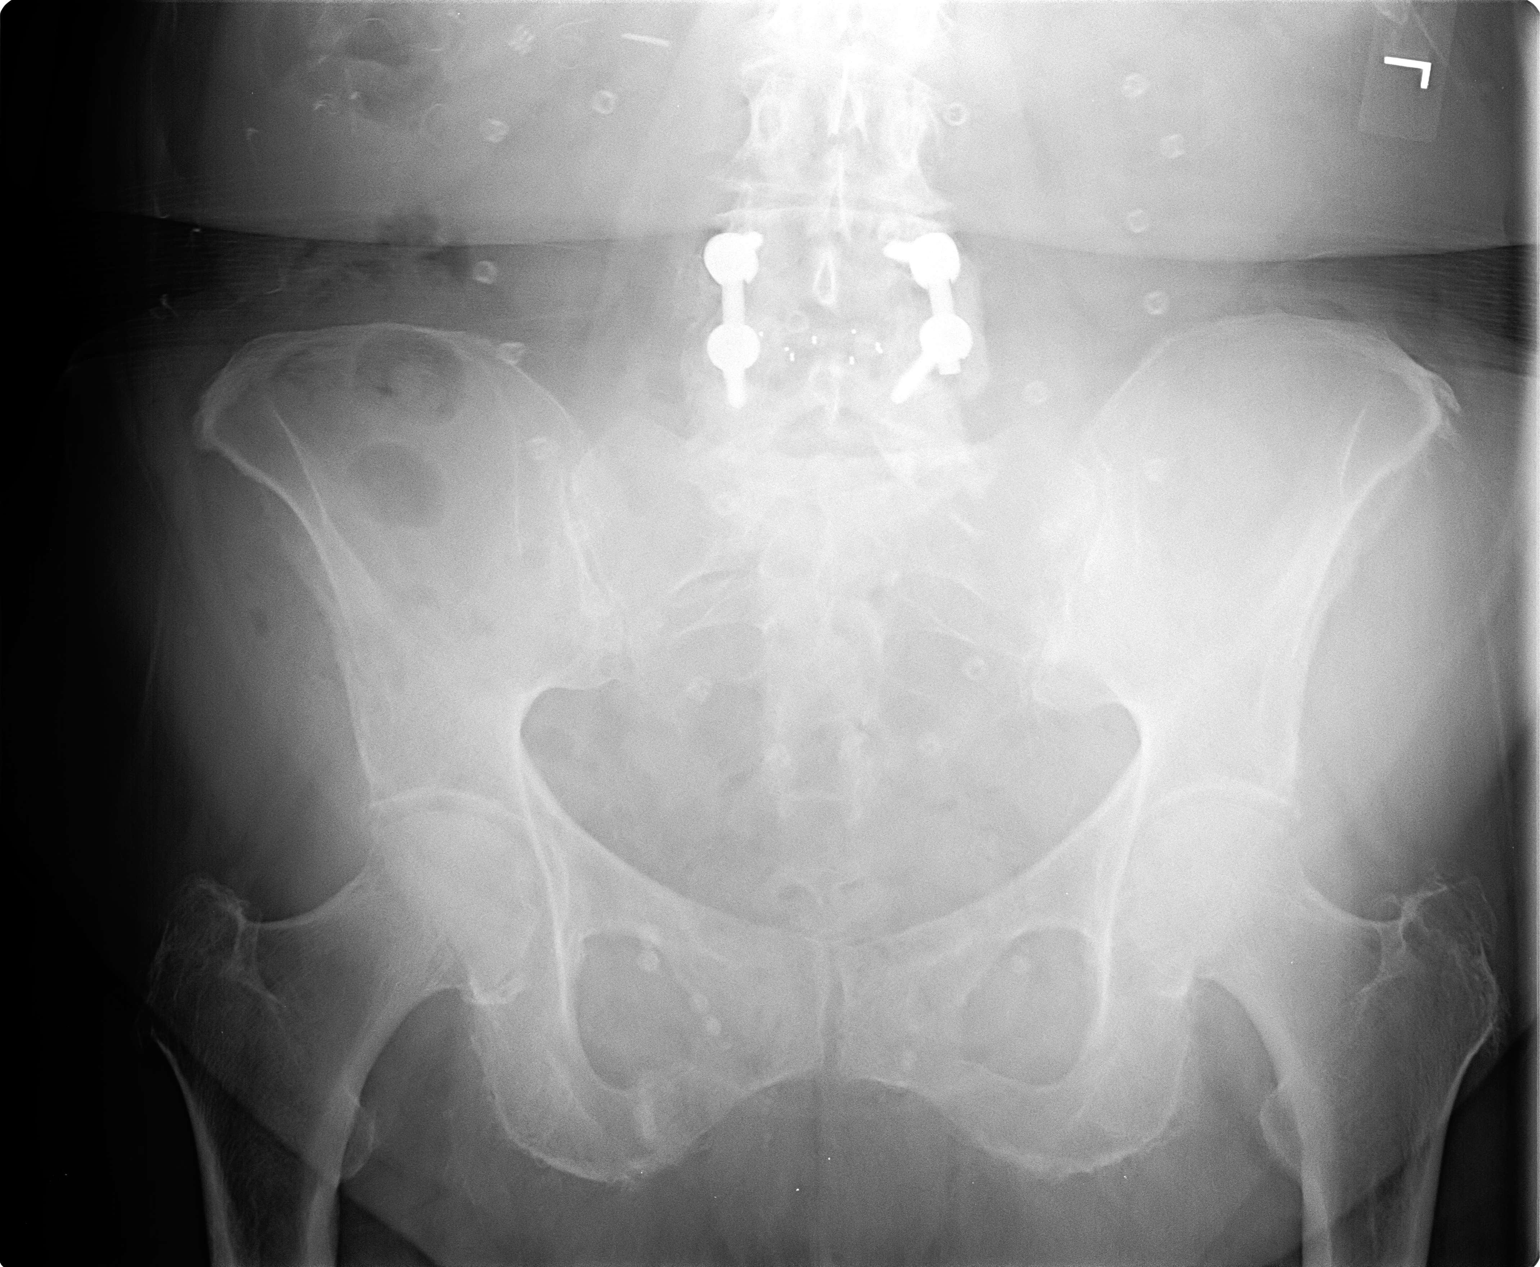

[1 of 1 positions shown; findings below may reference images not displayed]

FINDINGS: Continued healing of the right inferior pubic fracture noted. Callus
formation is present. No other acute abnormality identified. Prior
lower lumbar fusion. Prior hernia repair. Pelvic calcifications
consistent with phleboliths. Peripheral vascular calcification.
IMPRESSION: Continued healing right inferior pubic ramus fracture.

## 2017-05-15 ENCOUNTER — Other Ambulatory Visit: Payer: Self-pay | Admitting: *Deleted

## 2017-05-15 MED ORDER — GLUCOSE BLOOD VI STRP: ORAL_STRIP | 2 refills | Status: DC

## 2017-05-20 ENCOUNTER — Telehealth: Payer: Self-pay | Admitting: Family Medicine

## 2017-05-21 MED ORDER — GLUCOSE BLOOD VI STRP: ORAL_STRIP | 2 refills | Status: DC

## 2017-05-21 NOTE — Telephone Encounter (Signed)
Rx sent to pharmacy   

## 2017-05-23 ENCOUNTER — Other Ambulatory Visit (INDEPENDENT_AMBULATORY_CARE_PROVIDER_SITE_OTHER): Payer: Medicare Other

## 2017-05-23 DIAGNOSIS — C9110 Chronic lymphocytic leukemia of B-cell type not having achieved remission: Secondary | ICD-10-CM

## 2017-05-23 DIAGNOSIS — E1122 Type 2 diabetes mellitus with diabetic chronic kidney disease: Secondary | ICD-10-CM | POA: Diagnosis not present

## 2017-05-23 DIAGNOSIS — E785 Hyperlipidemia, unspecified: Secondary | ICD-10-CM | POA: Diagnosis not present

## 2017-05-23 DIAGNOSIS — N183 Chronic kidney disease, stage 3 unspecified: Secondary | ICD-10-CM

## 2017-05-23 DIAGNOSIS — E559 Vitamin D deficiency, unspecified: Secondary | ICD-10-CM

## 2017-05-23 DIAGNOSIS — C9190 Lymphoid leukemia, unspecified not having achieved remission: Secondary | ICD-10-CM | POA: Diagnosis not present

## 2017-05-23 DIAGNOSIS — E1169 Type 2 diabetes mellitus with other specified complication: Secondary | ICD-10-CM | POA: Diagnosis not present

## 2017-05-23 DIAGNOSIS — I10 Essential (primary) hypertension: Secondary | ICD-10-CM | POA: Diagnosis not present

## 2017-05-23 LAB — BAYER DCA HB A1C WAIVED: HB A1C: 9.2 % — AB (ref <7.0)

## 2017-05-24 LAB — BMP8+EGFR
BUN/Creatinine Ratio: 12 (ref 12–28)
BUN: 9 mg/dL (ref 8–27)
CO2: 20 mmol/L (ref 20–29)
CREATININE: 0.73 mg/dL (ref 0.57–1.00)
Calcium: 9.3 mg/dL (ref 8.7–10.3)
Chloride: 103 mmol/L (ref 96–106)
GFR, EST AFRICAN AMERICAN: 86 mL/min/{1.73_m2} (ref >59)
GFR, EST NON AFRICAN AMERICAN: 75 mL/min/{1.73_m2} (ref >59)
Glucose: 294 mg/dL — ABNORMAL HIGH (ref 65–99)
POTASSIUM: 4.5 mmol/L (ref 3.5–5.2)
SODIUM: 140 mmol/L (ref 134–144)

## 2017-05-24 LAB — HEPATIC FUNCTION PANEL
ALK PHOS: 58 IU/L (ref 39–117)
ALT: 13 IU/L (ref 0–32)
AST: 13 IU/L (ref 0–40)
Albumin: 4.2 g/dL (ref 3.5–4.7)
BILIRUBIN, DIRECT: 0.17 mg/dL (ref 0.00–0.40)
Bilirubin Total: 0.5 mg/dL (ref 0.0–1.2)
TOTAL PROTEIN: 6.3 g/dL (ref 6.0–8.5)

## 2017-05-24 LAB — CBC WITH DIFFERENTIAL/PLATELET
BASOS: 0 %
Basophils Absolute: 0 10*3/uL (ref 0.0–0.2)
EOS (ABSOLUTE): 0.1 10*3/uL (ref 0.0–0.4)
EOS: 1 %
HEMATOCRIT: 40.3 % (ref 34.0–46.6)
Hemoglobin: 13.2 g/dL (ref 11.1–15.9)
IMMATURE GRANS (ABS): 0 10*3/uL (ref 0.0–0.1)
IMMATURE GRANULOCYTES: 0 %
Lymphocytes Absolute: 6.1 10*3/uL — ABNORMAL HIGH (ref 0.7–3.1)
Lymphs: 53 %
MCH: 30.6 pg (ref 26.6–33.0)
MCHC: 32.8 g/dL (ref 31.5–35.7)
MCV: 94 fL (ref 79–97)
MONOS ABS: 0.6 10*3/uL (ref 0.1–0.9)
Monocytes: 6 %
NEUTROS ABS: 4.7 10*3/uL (ref 1.4–7.0)
Neutrophils: 40 %
PLATELETS: 174 10*3/uL (ref 150–379)
RBC: 4.31 x10E6/uL (ref 3.77–5.28)
RDW: 13.5 % (ref 12.3–15.4)
WBC: 11.5 10*3/uL — ABNORMAL HIGH (ref 3.4–10.8)

## 2017-05-24 LAB — LIPID PANEL
CHOL/HDL RATIO: 2.4 ratio (ref 0.0–4.4)
Cholesterol, Total: 137 mg/dL (ref 100–199)
HDL: 58 mg/dL (ref >39)
LDL Calculated: 49 mg/dL (ref 0–99)
Triglycerides: 148 mg/dL (ref 0–149)
VLDL CHOLESTEROL CAL: 30 mg/dL (ref 5–40)

## 2017-05-24 LAB — VITAMIN D 25 HYDROXY (VIT D DEFICIENCY, FRACTURES): VIT D 25 HYDROXY: 41.5 ng/mL (ref 30.0–100.0)

## 2017-05-27 ENCOUNTER — Ambulatory Visit (INDEPENDENT_AMBULATORY_CARE_PROVIDER_SITE_OTHER): Payer: Medicare Other | Admitting: Family Medicine

## 2017-05-27 ENCOUNTER — Encounter: Payer: Self-pay | Admitting: Family Medicine

## 2017-05-27 VITALS — BP 117/64 | HR 61 | Temp 96.9°F | Ht 62.5 in | Wt 164.0 lb

## 2017-05-27 DIAGNOSIS — H3530 Unspecified macular degeneration: Secondary | ICD-10-CM | POA: Diagnosis not present

## 2017-05-27 DIAGNOSIS — N390 Urinary tract infection, site not specified: Secondary | ICD-10-CM | POA: Diagnosis not present

## 2017-05-27 DIAGNOSIS — N183 Chronic kidney disease, stage 3 (moderate): Secondary | ICD-10-CM | POA: Diagnosis not present

## 2017-05-27 DIAGNOSIS — I10 Essential (primary) hypertension: Secondary | ICD-10-CM | POA: Diagnosis not present

## 2017-05-27 DIAGNOSIS — E1169 Type 2 diabetes mellitus with other specified complication: Secondary | ICD-10-CM | POA: Diagnosis not present

## 2017-05-27 DIAGNOSIS — E1122 Type 2 diabetes mellitus with diabetic chronic kidney disease: Secondary | ICD-10-CM

## 2017-05-27 DIAGNOSIS — K432 Incisional hernia without obstruction or gangrene: Secondary | ICD-10-CM | POA: Insufficient documentation

## 2017-05-27 DIAGNOSIS — H9193 Unspecified hearing loss, bilateral: Secondary | ICD-10-CM

## 2017-05-27 DIAGNOSIS — E559 Vitamin D deficiency, unspecified: Secondary | ICD-10-CM | POA: Diagnosis not present

## 2017-05-27 DIAGNOSIS — E785 Hyperlipidemia, unspecified: Secondary | ICD-10-CM

## 2017-05-27 DIAGNOSIS — C9190 Lymphoid leukemia, unspecified not having achieved remission: Secondary | ICD-10-CM

## 2017-05-27 DIAGNOSIS — R410 Disorientation, unspecified: Secondary | ICD-10-CM | POA: Diagnosis not present

## 2017-05-27 DIAGNOSIS — E1165 Type 2 diabetes mellitus with hyperglycemia: Secondary | ICD-10-CM | POA: Diagnosis not present

## 2017-05-27 DIAGNOSIS — C9110 Chronic lymphocytic leukemia of B-cell type not having achieved remission: Secondary | ICD-10-CM

## 2017-05-27 LAB — URINALYSIS, COMPLETE
Bilirubin, UA: NEGATIVE
NITRITE UA: NEGATIVE
PH UA: 5 (ref 5.0–7.5)
RBC, UA: NEGATIVE
Specific Gravity, UA: 1.025 (ref 1.005–1.030)
UUROB: 0.2 mg/dL (ref 0.2–1.0)

## 2017-05-27 LAB — MICROSCOPIC EXAMINATION: RENAL EPITHEL UA: NONE SEEN /HPF

## 2017-05-27 NOTE — Patient Instructions (Addendum)
Medicare Annual Wellness Visit  [facility] and the medical providers at [facility] strive to bring you the best medical care.  In doing so we not only want to address your current medical conditions and concerns but also to detect new conditions early and prevent illness, disease and health-related problems.    Medicare offers a yearly Wellness Visit which allows our clinical staff to assess your need for preventative services including immunizations, lifestyle education, counseling to decrease risk of preventable diseases and screening for fall risk and other medical concerns.    This visit is provided free of charge (no copay) for all Medicare recipients. The clinical pharmacists at [facility] have begun to conduct these Wellness Visits which will also include a thorough review of all your medications.    As you primary medical provider recommend that you make an appointment for your Annual Wellness Visit if you have not done so already this year.  You may set up this appointment before you leave today or you may call back ([phone]) and schedule an appointment.  Please make sure when you call that you mention that you are scheduling your Annual Wellness Visit with the clinical pharmacist so that the appointment may be made for the proper length of time.     Continue current medications. Continue good therapeutic lifestyle changes which include good diet and exercise. Fall precautions discussed with patient. If an FOBT was given today- please return it to our front desk. If you are over 30 years old - you may need Prevnar 72 or the adult Pneumonia vaccine.  **Flu shots are available--- please call and schedule a FLU-CLINIC appointment**  After your visit with [place] today you will receive a survey in the mail or online from [employer] regarding your care with [place]. Please take a moment to fill this out. Your feedback is very  important to [place] as you can help [place] better understand your patient needs as well as improve your experience and satisfaction. WE CARE ABOUT YOU!!!   Finish antibiotics Make sure that you bring all medicines to the office for review so that we can determine if you are still taking the glimepiride or not. Continue to drink plenty of fluids and stay well hydrated Follow-up with ophthalmologist as planned  MAKE sure to check your medications and see if you are taking GLIMEPIRIDE 4MG  or not???

## 2017-05-27 NOTE — Progress Notes (Addendum)
Subjective:    Patient ID: Kristy [name], female    DOB: 08/07/1928, 83 y.o.   MRN: [id number]  HPI Pt here for follow up and management of chronic medical problems which includes diabetes, hyperlipidemia and hypertension. She is taking medication regularly.The patient is doing well overall and has become more forgetful. When asking her about her current medicines she is not sure if she is taking the glimepiride at all butshe is taking the metformin 1000 mg twice daily. She will double check on her medicines when she goes home she will call us back regarding the Amaryl or glimepiride. I record so that she was supposed to be taking 1 tablet twice daily. She has had blood work done and this will be reviewed with her during the visit today. All for cholesterol numbers were good with an LDL C being 49 and triglycerides being 148. The blood sugar was very elevated at 294 and this is a big difference from 4 months ago when it was still elevated but just 179. Creatinine, the most important kidney function test was normal all the electrolytes including potassium were normal. The white blood cell count remains elevated as it has been in the past but not quite as high and this goes along with her chronic lymphocytic leukemia. Her hemoglobin is good and stable and her platelet count is adequate. Vitamin D level was good at 41.5 in all liver function tests were normal and hemoglobin A1c reflects her high blood sugar and was 9.2% and previously this was 7.2%. The patient denies any chest pain or shortness of breath. She denies any trouble with nausea vomiting diarrhea or blood in the stool. She has had a recent urinary tract infection and has not quite finished the Septra DS that she was prescribed. On discussing with her about her blood sugar medicine she does not remember the [place] per right or Amaryl.    Patient Active Problem List   Diagnosis Date Noted  . Type 2 diabetes mellitus with stage 3 chronic kidney  disease ([place]) 07/07/2015  . Type 2 diabetes mellitus with chronic kidney disease and hypertension ([place]) 07/07/2015  . SVT (supraventricular tachycardia) ([place]) 02/08/2015  . Depression 08/24/2013  . Essential hypertension, benign 06/08/2013  . Hyperlipidemia associated with type 2 diabetes mellitus ([place]) 06/08/2013  . Vitamin D deficiency 06/08/2013  . Diabetes mellitus type 2 controlled 06/08/2013  . OA (osteoarthritis) of knee 01/18/2013  . CLL (chronic lymphocytic leukemia) ([place]) 06/17/2012   Outpatient Encounter Prescriptions as of 05/27/2017  Medication Sig  . ADVOCATE LANCETS MISC Check Blood Sugar bid. Dx E11.9  . ALPRAZolam (XANAX) 0.25 MG tablet TAKE 1/2 TABLET BY MOUTH TWICE A DAY OR 1 AT BEDTIME  . aspirin EC 81 MG tablet Take 81 mg by mouth daily.  . cholecalciferol (VITAMIN D) 1000 UNITS tablet Take 1,000 Units by mouth daily.  Marland Kitchen esomeprazole (NEXIUM) 40 MG capsule Take 1 capsule (40 mg total) by mouth daily. (Patient taking differently: Take 40 mg by mouth as needed (reflux). )  . furosemide (LASIX) 40 MG tablet Take 40 mg by mouth 2 (two) times daily as needed for fluid.   Marland Kitchen glucose blood (FREESTYLE TEST STRIPS) test strip Check Blood sugar two times daily  . hydroxypropyl methylcellulose (ISOPTO TEARS) 2.5 % ophthalmic solution Place 1 drop into both eyes 2 (two) times daily as needed (dry eyes).  Marland Kitchen lisinopril (PRINIVIL,ZESTRIL) 10 MG tablet TAKE 1/2 TABLET DAILY AS DIRECTED  . LUMIGAN 0.01 % SOLN   .  metFORMIN (GLUCOPHAGE) 1000 MG tablet TAKE 1 TABLET BY MOUTH WITH MEALS BREAKFAST AND SUPPER  . metoprolol succinate (TOPROL-XL) 25 MG 24 hr tablet TAKE 1 TABLET BY MOUTH EVERY DAY  . Multiple Vitamins-Minerals (PRESERVISION AREDS 2 PO) Take by mouth.  . Omega-3 Fatty Acids (FISH OIL) 1000 MG CAPS Take 1 capsule by mouth daily.  . pravastatin (PRAVACHOL) 80 MG tablet TAKE ONE TABLET BY MOUTH AT BEDTIME  . [DISCONTINUED] glimepiride (AMARYL) 4 MG tablet TAKE 1 TABLET TWICE A DAY   . [DISCONTINUED] methocarbamol (ROBAXIN) 500 MG tablet Take 1 tablet (500 mg total) by mouth 2 (two) times daily.  . [DISCONTINUED] sulfamethoxazole-trimethoprim (BACTRIM DS,SEPTRA DS) 800-160 MG tablet Take 1 tablet by mouth 2 (two) times daily.   No facility-administered encounter medications on file as of 05/27/2017.      Review of Systems  Constitutional: Negative.   HENT: Negative.   Eyes: Negative.   Respiratory: Negative.   Cardiovascular: Negative.   Gastrointestinal: Negative.   Endocrine: Negative.   Genitourinary: Negative.   Musculoskeletal: Negative.   Skin: Negative.   Allergic/Immunologic: Negative.   Neurological: Negative.   Hematological: Negative.   Psychiatric/Behavioral: Negative.        Objective:   Physical Exam  Constitutional: She is oriented to person, place, and time. She appears well-developed and well-nourished. No distress.  The patient is pleasant and alert and appears to have trouble with her vision which she does.  HENT:  Head: Normocephalic and atraumatic.  Right Ear: External ear normal.  Left Ear: External ear normal.  Nose: Nose normal.  Mouth/Throat: Oropharynx is clear and moist.  Bilateral hearing aids  Eyes: Pupils are equal, round, and reactive to light. Conjunctivae and EOM are normal. Right eye exhibits no discharge. Left eye exhibits no discharge. No scleral icterus.  Diminished vision  Neck: Normal range of motion. Neck supple. No thyromegaly present.  No bruits thyromegaly or anterior cervical adenopathy  Cardiovascular: Normal rate, regular rhythm, normal heart sounds and intact distal pulses.   No murmur heard. The heart has a regular rate and rhythm at 60/m  Pulmonary/Chest: Effort normal and breath sounds normal. No respiratory distress. She has no wheezes. She has no rales.  Clear anteriorly and posteriorly  Abdominal: Soft. Bowel sounds are normal. She exhibits no mass. There is tenderness. There is no rebound and no  guarding.  Abdominal obesity with an incisional hernia at the superior aspect of the cholecystectomy scar. There is some tenderness in this area.  Musculoskeletal: She exhibits no edema or tenderness.  Patient has had bilateral knee replacements. Slow with movement due to pain.  Lymphadenopathy:    She has no cervical adenopathy.  Neurological: She is alert and oriented to person, place, and time. She has normal reflexes. No cranial nerve deficit.  Skin: Skin is warm and dry. No rash noted.  Psychiatric: She has a normal mood and affect. Her behavior is normal. Judgment and thought content normal.  Nursing note and vitals reviewed.  BP 117/64 (BP Location: Left Arm)   Pulse 61   Temp (!) 96.9 F (36.1 C) (Oral)   Ht 5' 2.5" (1.588 m)   Wt 164 lb (74.4 kg)   BMI 29.52 kg/m         Assessment & Plan:  1. Type 2 diabetes mellitus with stage 3 chronic kidney disease, without long-term current use of insulin ([place]) -There's been a drastic change in her hemoglobin A 1C from 7.2-9.2. She is going to bring her  medicines back and see if for some reason she is not taking one of the medicines that she should've been taking this could certainly account for the elevated hemoglobin A1c. She says she's been eating the way she normally eats.  2. Hyperlipidemia associated with type 2 diabetes mellitus ([place]) -All cholesterol numbers were good and she will continue with current treatment  3. Vitamin D deficiency -Vitamin D level was good and she will continue with current treatment  4. Essential hypertension, benign -Continue with current treatment  5. CLL (chronic lymphocytic leukemia) ([place]) -White blood cell count was only slightly elevated today and the patient has seen the hematologist in the past and does not need to see her back unless the white count goes extremely high. We will continue to monitor this at future visits.  6. Poorly controlled type 2 diabetes mellitus ([place]) -Hemoglobin A1c  was 7.2 and is now 9.2. Patient is unaware of the [facility] parietal and will bring medicines back for [place] to review for her to make sure she is taking this.  7. Urinary tract infection without hematuria, site unspecified -Urinalysis today. Patient has not completed all of sulfa medication which she will go ahead and do.  8. Macular degeneration of both eyes, unspecified type -Follow-up with ophthalmology as planned  9. Hearing impaired person, bilateral -Hearing aids are great benefit she will continue with these.  10. Incisional hernia without complications  Patient Instructions                       Medicare Annual Wellness Visit  [facility] and the medical providers at [facility] strive to bring you the best medical care.  In doing so we not only want to address your current medical conditions and concerns but also to detect new conditions early and prevent illness, disease and health-related problems.    Medicare offers a yearly Wellness Visit which allows our clinical staff to assess your need for preventative services including immunizations, lifestyle education, counseling to decrease risk of preventable diseases and screening for fall risk and other medical concerns.    This visit is provided free of charge (no copay) for all Medicare recipients. The clinical pharmacists at [facility] have begun to conduct these Wellness Visits which will also include a thorough review of all your medications.    As you primary medical provider recommend that you make an appointment for your Annual Wellness Visit if you have not done so already this year.  You may set up this appointment before you leave today or you may call back ([phone]) and schedule an appointment.  Please make sure when you call that you mention that you are scheduling your Annual Wellness Visit with the clinical pharmacist so that the appointment may be made for the proper length  of time.     Continue current medications. Continue good therapeutic lifestyle changes which include good diet and exercise. Fall precautions discussed with patient. If an FOBT was given today- please return it to our front desk. If you are over 39 years old - you may need Prevnar 82 or the adult Pneumonia vaccine.  **Flu shots are available--- please call and schedule a FLU-CLINIC appointment**  After your visit with [place] today you will receive a survey in the mail or online from [employer] regarding your care with [place]. Please take a moment to fill this out. Your feedback is very important to [place] as you can help  [place] better understand your patient needs as well as improve your experience and satisfaction. WE CARE ABOUT YOU!!!   Finish antibiotics Make sure that you bring all medicines to the office for review so that we can determine if you are still taking the glimepiride or not. Continue to drink plenty of fluids and stay well hydrated Follow-up with ophthalmologist as planned  Arrie Senate MD

## 2017-05-27 NOTE — Addendum Note (Signed)
Addended by: Zannie Cove on: 05/27/2017 02:48 PM   Modules accepted: Orders

## 2017-05-28 ENCOUNTER — Telehealth: Payer: Self-pay | Admitting: *Deleted

## 2017-05-28 DIAGNOSIS — R739 Hyperglycemia, unspecified: Secondary | ICD-10-CM

## 2017-05-28 LAB — URINE CULTURE

## 2017-05-28 MED ORDER — GLIMEPIRIDE 4 MG PO TABS: 4.0000 mg | ORAL_TABLET | Freq: Every day | ORAL | 1 refills | Status: DC

## 2017-05-28 NOTE — Telephone Encounter (Signed)
Please restart glimepiride 4 mg once daily. Call in new prescription in and have her come by in 4 weeks and get a fasting blood sugar here with [place]. Bring any outside fasting blood sugar readings in with her to that visit

## 2017-05-28 NOTE — Telephone Encounter (Signed)
Med ordered

## 2017-06-09 ENCOUNTER — Other Ambulatory Visit: Payer: Self-pay | Admitting: Family Medicine

## 2017-06-20 ENCOUNTER — Other Ambulatory Visit: Payer: Medicare Other

## 2017-06-20 DIAGNOSIS — R739 Hyperglycemia, unspecified: Secondary | ICD-10-CM

## 2017-06-20 LAB — GLUCOSE HEMOCUE WAIVED: Glu Hemocue Waived: 189 mg/dL — ABNORMAL HIGH (ref 65–99)

## 2017-06-25 DIAGNOSIS — H401133 Primary open-angle glaucoma, bilateral, severe stage: Secondary | ICD-10-CM | POA: Diagnosis not present

## 2017-06-26 ENCOUNTER — Ambulatory Visit (INDEPENDENT_AMBULATORY_CARE_PROVIDER_SITE_OTHER): Payer: Medicare Other

## 2017-06-26 DIAGNOSIS — Z23 Encounter for immunization: Secondary | ICD-10-CM

## 2017-07-28 ENCOUNTER — Other Ambulatory Visit: Payer: Self-pay

## 2017-07-28 NOTE — Patient Outreach (Signed)
[facility]) Care Management  07/28/2017  SHEENAH DIMITROFF 1928/08/30 [id number]   Medication Adherence call to Mrs. Oran Rein the reason for this call is because Mrs. Schumm is showing past due under [facility] Ins.on Pravastatin 80 mg spoke to patient she said she still has medication she has been taking this medication once a day patient said she has medication until mid November .   [facility] Management Direct Dial [phone]  Fax [phone] Veto [email or web address]

## 2017-07-31 ENCOUNTER — Other Ambulatory Visit: Payer: Self-pay | Admitting: Family Medicine

## 2017-08-20 ENCOUNTER — Other Ambulatory Visit: Payer: Self-pay | Admitting: Family Medicine

## 2017-08-20 MED ORDER — GLUCOSE BLOOD VI STRP: ORAL_STRIP | 2 refills | Status: DC

## 2017-08-20 NOTE — Telephone Encounter (Signed)
Patient notified that refill on test strips was sent to pharmacy.

## 2017-08-20 NOTE — Telephone Encounter (Signed)
What is the name of the medication? OneTouch Ultra test strips  Have you contacted your pharmacy to request a refill? YES  Which pharmacy would you like this sent to? [facility] in [place]   Patient notified that their request is being sent to the clinical staff for review and that they should receive a call once it is complete. If they do not receive a call within 24 hours they can check with their pharmacy or our office.

## 2017-09-08 ENCOUNTER — Other Ambulatory Visit: Payer: Self-pay | Admitting: Family Medicine

## 2017-09-11 ENCOUNTER — Other Ambulatory Visit: Payer: Self-pay | Admitting: Family Medicine

## 2017-09-11 MED ORDER — EZETIMIBE 10 MG PO TABS: 10.0000 mg | ORAL_TABLET | Freq: Every day | ORAL | 2 refills | Status: DC

## 2017-09-11 NOTE — Telephone Encounter (Signed)
Pt aware Rx sent to [facility]

## 2017-10-08 ENCOUNTER — Telehealth: Payer: Self-pay | Admitting: Family Medicine

## 2017-10-08 NOTE — Telephone Encounter (Signed)
Pt aware of appt.

## 2017-10-10 ENCOUNTER — Other Ambulatory Visit: Payer: Medicare Other

## 2017-10-10 DIAGNOSIS — I10 Essential (primary) hypertension: Secondary | ICD-10-CM | POA: Diagnosis not present

## 2017-10-10 DIAGNOSIS — N183 Chronic kidney disease, stage 3 unspecified: Secondary | ICD-10-CM

## 2017-10-10 DIAGNOSIS — E1169 Type 2 diabetes mellitus with other specified complication: Secondary | ICD-10-CM | POA: Diagnosis not present

## 2017-10-10 DIAGNOSIS — E785 Hyperlipidemia, unspecified: Secondary | ICD-10-CM | POA: Diagnosis not present

## 2017-10-10 DIAGNOSIS — C9110 Chronic lymphocytic leukemia of B-cell type not having achieved remission: Secondary | ICD-10-CM

## 2017-10-10 DIAGNOSIS — E1122 Type 2 diabetes mellitus with diabetic chronic kidney disease: Secondary | ICD-10-CM

## 2017-10-10 DIAGNOSIS — E559 Vitamin D deficiency, unspecified: Secondary | ICD-10-CM

## 2017-10-10 LAB — BAYER DCA HB A1C WAIVED: HB A1C (BAYER DCA - WAIVED): 7.8 % — ABNORMAL HIGH (ref <7.0)

## 2017-10-11 LAB — BMP8+EGFR
BUN/Creatinine Ratio: 12 (ref 12–28)
BUN: 9 mg/dL (ref 8–27)
CO2: 22 mmol/L (ref 20–29)
CREATININE: 0.78 mg/dL (ref 0.57–1.00)
Calcium: 9.4 mg/dL (ref 8.7–10.3)
Chloride: 106 mmol/L (ref 96–106)
GFR calc Af Amer: 79 mL/min/{1.73_m2} (ref >59)
GFR, EST NON AFRICAN AMERICAN: 69 mL/min/{1.73_m2} (ref >59)
Glucose: 215 mg/dL — ABNORMAL HIGH (ref 65–99)
Potassium: 4.9 mmol/L (ref 3.5–5.2)
Sodium: 142 mmol/L (ref 134–144)

## 2017-10-11 LAB — CBC WITH DIFFERENTIAL/PLATELET
BASOS: 0 %
Basophils Absolute: 0 10*3/uL (ref 0.0–0.2)
EOS (ABSOLUTE): 0.2 10*3/uL (ref 0.0–0.4)
EOS: 1 %
HEMATOCRIT: 40 % (ref 34.0–46.6)
Hemoglobin: 13.1 g/dL (ref 11.1–15.9)
Immature Grans (Abs): 0 10*3/uL (ref 0.0–0.1)
Immature Granulocytes: 0 %
Lymphocytes Absolute: 8.6 10*3/uL — ABNORMAL HIGH (ref 0.7–3.1)
Lymphs: 59 %
MCH: 30.2 pg (ref 26.6–33.0)
MCHC: 32.8 g/dL (ref 31.5–35.7)
MCV: 92 fL (ref 79–97)
MONOS ABS: 0.8 10*3/uL (ref 0.1–0.9)
Monocytes: 6 %
Neutrophils Absolute: 4.8 10*3/uL (ref 1.4–7.0)
Neutrophils: 34 %
Platelets: 188 10*3/uL (ref 150–379)
RBC: 4.34 x10E6/uL (ref 3.77–5.28)
RDW: 14.1 % (ref 12.3–15.4)
WBC: 14.5 10*3/uL — ABNORMAL HIGH (ref 3.4–10.8)

## 2017-10-11 LAB — LIPID PANEL
CHOL/HDL RATIO: 2.3 ratio (ref 0.0–4.4)
Cholesterol, Total: 137 mg/dL (ref 100–199)
HDL: 59 mg/dL (ref >39)
LDL Calculated: 55 mg/dL (ref 0–99)
Triglycerides: 113 mg/dL (ref 0–149)
VLDL Cholesterol Cal: 23 mg/dL (ref 5–40)

## 2017-10-11 LAB — HEPATIC FUNCTION PANEL
ALBUMIN: 4 g/dL (ref 3.5–4.7)
ALT: 13 IU/L (ref 0–32)
AST: 13 IU/L (ref 0–40)
Alkaline Phosphatase: 55 IU/L (ref 39–117)
BILIRUBIN TOTAL: 0.5 mg/dL (ref 0.0–1.2)
Bilirubin, Direct: 0.16 mg/dL (ref 0.00–0.40)
Total Protein: 5.9 g/dL — ABNORMAL LOW (ref 6.0–8.5)

## 2017-10-11 LAB — VITAMIN D 25 HYDROXY (VIT D DEFICIENCY, FRACTURES): VIT D 25 HYDROXY: 31.9 ng/mL (ref 30.0–100.0)

## 2017-10-15 ENCOUNTER — Encounter: Payer: Self-pay | Admitting: Family Medicine

## 2017-10-15 ENCOUNTER — Ambulatory Visit (INDEPENDENT_AMBULATORY_CARE_PROVIDER_SITE_OTHER): Payer: Medicare Other

## 2017-10-15 ENCOUNTER — Ambulatory Visit (INDEPENDENT_AMBULATORY_CARE_PROVIDER_SITE_OTHER): Payer: Medicare Other | Admitting: Family Medicine

## 2017-10-15 VITALS — BP 123/75 | HR 68 | Temp 98.4°F | Ht 62.5 in | Wt 170.0 lb

## 2017-10-15 DIAGNOSIS — N183 Chronic kidney disease, stage 3 (moderate): Secondary | ICD-10-CM

## 2017-10-15 DIAGNOSIS — R51 Headache: Secondary | ICD-10-CM

## 2017-10-15 DIAGNOSIS — Z13820 Encounter for screening for osteoporosis: Secondary | ICD-10-CM

## 2017-10-15 DIAGNOSIS — E1122 Type 2 diabetes mellitus with diabetic chronic kidney disease: Secondary | ICD-10-CM | POA: Diagnosis not present

## 2017-10-15 DIAGNOSIS — E559 Vitamin D deficiency, unspecified: Secondary | ICD-10-CM | POA: Diagnosis not present

## 2017-10-15 DIAGNOSIS — Z780 Asymptomatic menopausal state: Secondary | ICD-10-CM

## 2017-10-15 DIAGNOSIS — E785 Hyperlipidemia, unspecified: Secondary | ICD-10-CM | POA: Diagnosis not present

## 2017-10-15 DIAGNOSIS — C9190 Lymphoid leukemia, unspecified not having achieved remission: Secondary | ICD-10-CM | POA: Diagnosis not present

## 2017-10-15 DIAGNOSIS — E1169 Type 2 diabetes mellitus with other specified complication: Secondary | ICD-10-CM

## 2017-10-15 DIAGNOSIS — H9193 Unspecified hearing loss, bilateral: Secondary | ICD-10-CM | POA: Diagnosis not present

## 2017-10-15 DIAGNOSIS — H3530 Unspecified macular degeneration: Secondary | ICD-10-CM

## 2017-10-15 DIAGNOSIS — M85851 Other specified disorders of bone density and structure, right thigh: Secondary | ICD-10-CM | POA: Diagnosis not present

## 2017-10-15 DIAGNOSIS — C9110 Chronic lymphocytic leukemia of B-cell type not having achieved remission: Secondary | ICD-10-CM

## 2017-10-15 DIAGNOSIS — I10 Essential (primary) hypertension: Secondary | ICD-10-CM | POA: Diagnosis not present

## 2017-10-15 DIAGNOSIS — R519 Headache, unspecified: Secondary | ICD-10-CM

## 2017-10-15 MED ORDER — ALPRAZOLAM 0.25 MG PO TABS: ORAL_TABLET | ORAL | 1 refills | Status: DC

## 2017-10-15 NOTE — Progress Notes (Signed)
Subjective:    Patient ID: Kristy [name], female    DOB: 1928/08/22, 84 y.o.   MRN: [id number]  HPI Pt here for follow up and management of chronic medical problems which includes hyperlipidemia and diabetes. She is taking medication regularly.  Patient is complaining with a swimmy headed feeling especially with the right side of her head feeling funny at times.  She is due to get a DEXA scan.  She will also be given an FOBT to return.  She has had lab work done which we will will review with her during the visit today.  Her vital signs are stable.  Her weight is up 6 pounds since the last visit.  Cholesterol numbers with traditional lipid testing are all excellent and at goal with an LDL C being 55 and triglycerides being 113.  Good cholesterol remains good at 59.  The blood sugar however remains elevated at 215.  The creatinine, the most important kidney function test is within normal limits as well as all of the electrolytes including potassium.  The patient has a history of chronic lymphocytic leukemia and her white count remains elevated at about the levels it has been at in the past at 14,500.  Hemoglobin is good at 13.1 and the platelet count is adequate.  The vitamin D level was at the low end of the normal range at 31.9.  All liver function tests were within normal limits except the total protein was slightly decreased.  The hemoglobin A1c was definitely improved from 4 months ago but is still slightly elevated at 7.8%.  This means his average blood sugar is under better control but still not under as good of control as we would like.  We will make sure that she is taking her vitamin D regularly and if she is we will have her increase this to 2000 units daily.  The patient denies any chest pain or shortness of breath.  She denies any trouble with her stomach other than indigestion from eating something she should not have eaten.  She denies any blood in the stool black tarry bowel movements or  change in bowel habits.  She is passing her water without problems.  Her biggest issue is having glaucoma and not being able to see well.  She is followed closely by the ophthalmologist.     Patient Active Problem List   Diagnosis Date Noted  . Incisional hernia, without obstruction or gangrene 05/27/2017  . Type 2 diabetes mellitus with stage 3 chronic kidney disease ([place]) 07/07/2015  . Type 2 diabetes mellitus with chronic kidney disease and hypertension ([place]) 07/07/2015  . SVT (supraventricular tachycardia) ([place]) 02/08/2015  . Depression 08/24/2013  . Essential hypertension, benign 06/08/2013  . Hyperlipidemia associated with type 2 diabetes mellitus ([place]) 06/08/2013  . Vitamin D deficiency 06/08/2013  . Diabetes mellitus type 2 controlled 06/08/2013  . OA (osteoarthritis) of knee 01/18/2013  . CLL (chronic lymphocytic leukemia) ([place]) 06/17/2012   Outpatient Encounter Medications as of 10/15/2017  Medication Sig  . ADVOCATE LANCETS MISC Check Blood Sugar bid. Dx E11.9  . ALPRAZolam (XANAX) 0.25 MG tablet TAKE 1/2 TABLET BY MOUTH TWICE A DAY OR 1 AT BEDTIME  . aspirin EC 81 MG tablet Take 81 mg by mouth daily.  . cholecalciferol (VITAMIN D) 1000 UNITS tablet Take 1,000 Units by mouth daily.  Marland Kitchen esomeprazole (NEXIUM) 40 MG capsule Take 1 capsule (40 mg total) by mouth daily. (Patient taking differently: Take 40 mg by mouth as  needed (reflux). )  . ezetimibe (ZETIA) 10 MG tablet Take 1 tablet (10 mg total) by mouth daily.  . furosemide (LASIX) 40 MG tablet Take 40 mg by mouth 2 (two) times daily as needed for fluid.   Marland Kitchen glimepiride (AMARYL) 4 MG tablet Take 1 tablet (4 mg total) by mouth daily with breakfast.  . glucose blood (FREESTYLE TEST STRIPS) test strip Check Blood sugar two times daily  . hydroxypropyl methylcellulose (ISOPTO TEARS) 2.5 % ophthalmic solution Place 1 drop into both eyes 2 (two) times daily as needed (dry eyes).  Marland Kitchen lisinopril (PRINIVIL,ZESTRIL) 10 MG tablet TAKE  1/2 TABLET DAILY AS DIRECTED  . LUMIGAN 0.01 % SOLN   . metFORMIN (GLUCOPHAGE) 1000 MG tablet TAKE 1 TABLET BY MOUTH WITH MEALS BREAKFAST AND SUPPER  . metoprolol succinate (TOPROL-XL) 25 MG 24 hr tablet TAKE 1 TABLET BY MOUTH EVERY DAY  . Multiple Vitamins-Minerals (PRESERVISION AREDS 2 PO) Take by mouth.  . Omega-3 Fatty Acids (FISH OIL) 1000 MG CAPS Take 1 capsule by mouth daily.  . pravastatin (PRAVACHOL) 80 MG tablet TAKE ONE TABLET BY MOUTH AT BEDTIME   No facility-administered encounter medications on file as of 10/15/2017.       Review of Systems  Constitutional: Negative.   HENT: Negative.   Eyes: Negative.   Respiratory: Negative.   Cardiovascular: Negative.   Gastrointestinal: Negative.   Endocrine: Negative.   Genitourinary: Negative.   Musculoskeletal: Negative.   Skin: Negative.   Allergic/Immunologic: Negative.   Neurological: Positive for light-headedness (at times - right side of head feels funny ).  Hematological: Negative.   Psychiatric/Behavioral: Negative.        Objective:   Physical Exam  Constitutional: She is oriented to person, place, and time. She appears well-developed and well-nourished. No distress.  Is pleasant and alert but obviously has severely decreased vision which she says cannot be improved  HENT:  Head: Normocephalic and atraumatic.  Right Ear: External ear normal.  Left Ear: External ear normal.  Nose: Nose normal.  Mouth/Throat: Oropharynx is clear and moist. No oropharyngeal exudate.  Eyes: Conjunctivae and EOM are normal. Pupils are equal, round, and reactive to light. Right eye exhibits no discharge. Left eye exhibits no discharge. No scleral icterus.  Unable to see clearly  Neck: Normal range of motion. Neck supple. No thyromegaly present.  No bruits thyromegaly or anterior cervical adenopathy  Cardiovascular: Normal rate, regular rhythm, normal heart sounds and intact distal pulses.  No murmur heard. Heart is regular at 72/min   Pulmonary/Chest: Effort normal and breath sounds normal. No respiratory distress. She has no wheezes. She has no rales.  Clear anteriorly and posteriorly  Abdominal: Soft. Bowel sounds are normal. She exhibits no mass. There is no tenderness. There is no rebound and no guarding.  Ventral hernia is present mostly in incisional lines.  Musculoskeletal: Normal range of motion. She exhibits no edema.  Lymphadenopathy:    She has no cervical adenopathy.  Neurological: She is alert and oriented to person, place, and time. She has normal reflexes. No cranial nerve deficit.  Skin: Skin is warm and dry. No rash noted.  Psychiatric: She has a normal mood and affect. Her behavior is normal. Judgment and thought content normal.  Nursing note and vitals reviewed.  BP 123/75 (BP Location: Left Arm)   Pulse 68   Temp 98.4 F (36.9 C) (Oral)   Ht 5' 2.5" (1.588 m)   Wt 170 lb (77.1 kg)   BMI 30.60 kg/m  Assessment & Plan:  1. Type 2 diabetes mellitus with stage 3 chronic kidney disease, without long-term current use of insulin (HCC) -Hemoglobin A1c was still somewhat elevated but improved from the past and she will continue with current  2. Hyperlipidemia associated with type 2 diabetes mellitus ([place]) -Continue with current treatment  3. Essential hypertension, benign -Blood pressure is good today and she will continue with current treatment  4. Vitamin D deficiency -Continue with current treatment - DG [facility] DEXA; Future  5. CLL (chronic lymphocytic leukemia) ([place]) -White blood cell count remains elevated as it has been in the past.  6. Nonintractable headache, unspecified chronicity pattern, unspecified headache type -The patient has a head discomfort on the right side associated with dizziness and we will get a CT scan to make sure that there is no abnormality causing the symptoms - Sedimentation rate - CT Head Wo Contrast; Future  7. Screening for osteoporosis - DG [facility]  DEXA; Future  8. Postmenopausal - DG [facility] DEXA; Future  9. Macular degeneration of both eyes, unspecified type -Continue follow-up with ophthalmologist  10. Hearing impaired person, bilateral -Only has one hearing aid that is working and that is the left.  Meds ordered this encounter  Medications  . ALPRAZolam (XANAX) 0.25 MG tablet    Sig: TAKE 1/2 TABLET BY MOUTH TWICE A DAY OR 1 AT BEDTIME    Dispense:  90 tablet    Refill:  1    This request is for a new prescription for a controlled substance as required by Federal/State law..   Patient Instructions                       Medicare Annual Wellness Visit  [facility] and the medical providers at [facility] strive to bring you the best medical care.  In doing so we not only want to address your current medical conditions and concerns but also to detect new conditions early and prevent illness, disease and health-related problems.    Medicare offers a yearly Wellness Visit which allows our clinical staff to assess your need for preventative services including immunizations, lifestyle education, counseling to decrease risk of preventable diseases and screening for fall risk and other medical concerns.    This visit is provided free of charge (no copay) for all Medicare recipients. The clinical pharmacists at [facility] have begun to conduct these Wellness Visits which will also include a thorough review of all your medications.    As you primary medical provider recommend that you make an appointment for your Annual Wellness Visit if you have not done so already this year.  You may set up this appointment before you leave today or you may call back ([phone]) and schedule an appointment.  Please make sure when you call that you mention that you are scheduling your Annual Wellness Visit with the clinical pharmacist so that the appointment may be made for the proper length of time.      Continue current medications. Continue good therapeutic lifestyle changes which include good diet and exercise. Fall precautions discussed with patient. If an FOBT was given today- please return it to our front desk. If you are over 4 years old - you may need Prevnar 54 or the adult Pneumonia vaccine.  **Flu shots are available--- please call and schedule a FLU-CLINIC appointment**  After your visit with [place] today you will receive a survey in the mail or online from [employer] regarding your care with [place]. Please take a moment to fill this out. Your feedback is very important to [place] as you can help [place] better understand your patient needs as well as improve your experience and satisfaction. WE CARE ABOUT YOU!!!   Course weakening purposes, stop using Splenda and only use Stevia force weakening or Stevia products Continue to follow-up with ophthalmology Because of the worsening dizziness episodes we will get a CT scan of your head  Arrie Senate MD

## 2017-10-15 NOTE — Patient Instructions (Addendum)
Medicare Annual Wellness Visit  [facility] and the medical providers at [facility] strive to bring you the best medical care.  In doing so we not only want to address your current medical conditions and concerns but also to detect new conditions early and prevent illness, disease and health-related problems.    Medicare offers a yearly Wellness Visit which allows our clinical staff to assess your need for preventative services including immunizations, lifestyle education, counseling to decrease risk of preventable diseases and screening for fall risk and other medical concerns.    This visit is provided free of charge (no copay) for all Medicare recipients. The clinical pharmacists at [facility] have begun to conduct these Wellness Visits which will also include a thorough review of all your medications.    As you primary medical provider recommend that you make an appointment for your Annual Wellness Visit if you have not done so already this year.  You may set up this appointment before you leave today or you may call back ([phone]) and schedule an appointment.  Please make sure when you call that you mention that you are scheduling your Annual Wellness Visit with the clinical pharmacist so that the appointment may be made for the proper length of time.     Continue current medications. Continue good therapeutic lifestyle changes which include good diet and exercise. Fall precautions discussed with patient. If an FOBT was given today- please return it to our front desk. If you are over 73 years old - you may need Prevnar 75 or the adult Pneumonia vaccine.  **Flu shots are available--- please call and schedule a FLU-CLINIC appointment**  After your visit with [place] today you will receive a survey in the mail or online from [employer] regarding your care with [place]. Please take a moment to fill this out. Your feedback is very  important to [place] as you can help [place] better understand your patient needs as well as improve your experience and satisfaction. WE CARE ABOUT YOU!!!   Course weakening purposes, stop using Splenda and only use Stevia force weakening or Stevia products Continue to follow-up with ophthalmology Because of the worsening dizziness episodes we will get a CT scan of your head

## 2017-10-16 LAB — SEDIMENTATION RATE: SED RATE: 3 mm/h (ref 0–40)

## 2017-10-27 ENCOUNTER — Other Ambulatory Visit: Payer: Self-pay | Admitting: Family Medicine

## 2017-10-28 ENCOUNTER — Ambulatory Visit (HOSPITAL_COMMUNITY)
Admission: RE | Admit: 2017-10-28 | Discharge: 2017-10-28 | Disposition: A | Discharge status: Home / Self Care | Payer: Medicare Other | Source: Ambulatory Visit | Attending: Family Medicine | Admitting: Family Medicine

## 2017-10-28 DIAGNOSIS — R519 Headache, unspecified: Secondary | ICD-10-CM

## 2017-10-28 DIAGNOSIS — R51 Headache: Secondary | ICD-10-CM | POA: Diagnosis not present

## 2017-10-28 DIAGNOSIS — R42 Dizziness and giddiness: Secondary | ICD-10-CM | POA: Diagnosis not present

## 2017-10-28 DIAGNOSIS — G319 Degenerative disease of nervous system, unspecified: Secondary | ICD-10-CM | POA: Insufficient documentation

## 2017-11-26 ENCOUNTER — Other Ambulatory Visit: Payer: Self-pay | Admitting: Family Medicine

## 2017-12-01 ENCOUNTER — Other Ambulatory Visit: Payer: Self-pay | Admitting: Family Medicine

## 2017-12-01 ENCOUNTER — Telehealth: Payer: Self-pay | Admitting: Family Medicine

## 2017-12-01 NOTE — Telephone Encounter (Signed)
appt scheduled Pt notified 

## 2017-12-03 ENCOUNTER — Encounter: Payer: Self-pay | Admitting: Family Medicine

## 2017-12-03 ENCOUNTER — Ambulatory Visit (INDEPENDENT_AMBULATORY_CARE_PROVIDER_SITE_OTHER): Payer: Medicare Other | Admitting: Family Medicine

## 2017-12-03 VITALS — BP 119/63 | HR 70 | Temp 96.8°F | Ht 62.5 in | Wt 168.0 lb

## 2017-12-03 DIAGNOSIS — R51 Headache: Secondary | ICD-10-CM | POA: Diagnosis not present

## 2017-12-03 DIAGNOSIS — C9190 Lymphoid leukemia, unspecified not having achieved remission: Secondary | ICD-10-CM | POA: Diagnosis not present

## 2017-12-03 DIAGNOSIS — R42 Dizziness and giddiness: Secondary | ICD-10-CM

## 2017-12-03 DIAGNOSIS — C9110 Chronic lymphocytic leukemia of B-cell type not having achieved remission: Secondary | ICD-10-CM

## 2017-12-03 DIAGNOSIS — H3530 Unspecified macular degeneration: Secondary | ICD-10-CM | POA: Diagnosis not present

## 2017-12-03 DIAGNOSIS — R110 Nausea: Secondary | ICD-10-CM | POA: Diagnosis not present

## 2017-12-03 DIAGNOSIS — R519 Headache, unspecified: Secondary | ICD-10-CM

## 2017-12-03 DIAGNOSIS — E1122 Type 2 diabetes mellitus with diabetic chronic kidney disease: Secondary | ICD-10-CM | POA: Diagnosis not present

## 2017-12-03 NOTE — Patient Instructions (Addendum)
Because of your ongoing symptoms with your head and the pulling sensation we will arrange for you to see the neurologist for further evaluation Take a copy of your blood work and the CT scan report with you to that visit Also when you go see the ophthalmologist, take a copy with you to that visit also.

## 2017-12-03 NOTE — Progress Notes (Signed)
Subjective:    Patient ID: Kristy [name], female    DOB: 07/21/1928, 84 y.o.   MRN: [id number]  HPI Patient here today for dizziness and lightheaded sensations that occur randomly.  The patient comes with her husband to the visit today.  She complains of a headache every morning.  She does not think she has any memory issues or problems other than her age.  The husband says that she calls out names of people who are not there.  She also has diminished vision.  Her MMSE score was 27 out of 28.  She recently had a CT head without contrast and this showed mild atrophy with mild chronic microvascular changes with no acute intracranial abnormality.  This was reviewed prior to the visit and the patient and her husband would be given another copy of this report.  The patient has a history of chronic lymphocytic leukemia.  Recent lab work done in January was stable compared to past readings with a persistently elevated WBC.  A1c renal test and hemoglobin were all stable.  She did have a sed rate done about a week later and the sedimentation rate was normal at 3.  The patient describes her symptoms as lightheaded or dizziness but then she says is not really dizziness.  She talks about a sudden onset of pulling to the right whether she is sitting or standing.  These episodes have been going on for about 6 months at a frequency of about once a week.  She also describes a sensation of floating.  She has macular degeneration and I suspect that some of this could be attributed to the macular degeneration and the loss of her vision which cannot be fixed according to the patient and her husband and they are currently seeing [facility] eye physicians in [place].  Lately she has had some nausea with these episodes of "dizziness".  Patient denies any chest pain shortness of breath trouble with her stomach including nausea vomiting diarrhea blood in the stool or black tarry bowel movements.  She is passing her  water without problems.   Patient Active Problem List   Diagnosis Date Noted  . Incisional hernia, without obstruction or gangrene 05/27/2017  . Type 2 diabetes mellitus with stage 3 chronic kidney disease ([place]) 07/07/2015  . Type 2 diabetes mellitus with chronic kidney disease and hypertension ([place]) 07/07/2015  . SVT (supraventricular tachycardia) ([place]) 02/08/2015  . Depression 08/24/2013  . Essential hypertension, benign 06/08/2013  . Hyperlipidemia associated with type 2 diabetes mellitus ([place]) 06/08/2013  . Vitamin D deficiency 06/08/2013  . Diabetes mellitus type 2 controlled 06/08/2013  . OA (osteoarthritis) of knee 01/18/2013  . CLL (chronic lymphocytic leukemia) ([place]) 06/17/2012   Outpatient Encounter Medications as of 12/03/2017  Medication Sig  . ADVOCATE LANCETS MISC Check Blood Sugar bid. Dx E11.9  . ALPRAZolam (XANAX) 0.25 MG tablet TAKE 1/2 TABLET BY MOUTH TWICE A DAY OR 1 AT BEDTIME  . aspirin EC 81 MG tablet Take 81 mg by mouth daily.  . cholecalciferol (VITAMIN D) 1000 UNITS tablet Take 1,000 Units by mouth daily.  Marland Kitchen esomeprazole (NEXIUM) 40 MG capsule Take 1 capsule (40 mg total) by mouth daily. (Patient taking differently: Take 40 mg by mouth as needed (reflux). )  . ezetimibe (ZETIA) 10 MG tablet Take 1 tablet (10 mg total) by mouth daily.  . furosemide (LASIX) 40 MG tablet Take 40 mg by mouth 2 (two) times daily as needed for fluid.   Marland Kitchen  glimepiride (AMARYL) 4 MG tablet TAKE 1 TABLET (4 MG TOTAL) BY MOUTH DAILY WITH BREAKFAST.  Marland Kitchen glucose blood (FREESTYLE TEST STRIPS) test strip Check Blood sugar two times daily  . hydroxypropyl methylcellulose (ISOPTO TEARS) 2.5 % ophthalmic solution Place 1 drop into both eyes 2 (two) times daily as needed (dry eyes).  Marland Kitchen lisinopril (PRINIVIL,ZESTRIL) 10 MG tablet TAKE 1/2 TABLET DAILY AS DIRECTED  . LUMIGAN 0.01 % SOLN   . metFORMIN (GLUCOPHAGE) 1000 MG tablet TAKE 1 TABLET BY MOUTH WITH MEALS BREAKFAST AND SUPPER  . metoprolol  succinate (TOPROL-XL) 25 MG 24 hr tablet TAKE 1 TABLET BY MOUTH EVERY DAY  . Multiple Vitamins-Minerals (PRESERVISION AREDS 2 PO) Take by mouth.  . Omega-3 Fatty Acids (FISH OIL) 1000 MG CAPS Take 1 capsule by mouth daily.  . pravastatin (PRAVACHOL) 80 MG tablet TAKE ONE TABLET BY MOUTH AT BEDTIME   No facility-administered encounter medications on file as of 12/03/2017.       MMSE done today - pt has visual changes and can not see well. She scored 27/28 today (with 2 questions exempt due to vision.)    Review of Systems  Constitutional: Negative.   HENT: Negative.   Eyes: Negative.   Respiratory: Negative.   Cardiovascular: Negative.   Gastrointestinal: Negative.   Endocrine: Negative.   Genitourinary: Negative.   Musculoskeletal: Negative.   Skin: Negative.   Allergic/Immunologic: Negative.   Neurological: Positive for dizziness, light-headedness and headaches.  Hematological: Negative.   Psychiatric/Behavioral: Negative.        Objective:   Physical Exam  Constitutional: She is oriented to person, place, and time. She appears well-developed and well-nourished. No distress.  The patient is pleasant and relaxed but obviously has trouble seeing me in the room because of macular degeneration.  HENT:  Head: Normocephalic and atraumatic.  Eyes: Conjunctivae and EOM are normal. Pupils are equal, round, and reactive to light. Right eye exhibits no discharge. Left eye exhibits no discharge. No scleral icterus.  Neck: Normal range of motion.  Cardiovascular: Normal rate, regular rhythm and normal heart sounds.  No murmur heard. Pulmonary/Chest: Effort normal and breath sounds normal. No respiratory distress. She has no wheezes. She has no rales.  Musculoskeletal: Normal range of motion. She exhibits no edema.  Neurological: She is alert and oriented to person, place, and time.  Skin: Skin is warm and dry. No rash noted.  Psychiatric: She has a normal mood and affect. Her behavior  is normal. Judgment and thought content normal.  Nursing note and vitals reviewed.  BP 119/63 (BP Location: Left Arm)   Pulse 70   Temp (!) 96.8 F (36 C) (Oral)   Ht 5' 2.5" (1.588 m)   Wt 168 lb (76.2 kg)   BMI 30.24 kg/m         Assessment & Plan:  1. Dizziness -This word is used to characterize the episodes of this pulling of her body to the right whether she is sitting or standing.  These have been going on at a frequency of about 1/week episode going on for 6 months. -She was given a copy of the CT report of her head that was noncontrast and her recent lab work to take with her especially to the visit to see the ophthalmologist.  2. Macular degeneration of both eyes, unspecified type -Is an ongoing problem and has affected her vision severely.  She has an upcoming appointment with [facility] eye physicians in [place] sometime in April.  3.  CLL (chronic lymphocytic leukemia) (HCC) -We are continuing to monitor the white blood cell count and was told by the hematologist that she did not need to come back and let us to get as much higher than it has currently been recently.  Patient Instructions  Because of your ongoing symptoms with your head and the pulling sensation we will arrange for you to see the neurologist for further evaluation Take a copy of your blood work and the CT scan report with you to that visit Also when you go see the ophthalmologist, take a copy with you to that visit also.   Arrie Senate MD

## 2017-12-04 ENCOUNTER — Other Ambulatory Visit: Payer: Self-pay | Admitting: *Deleted

## 2017-12-04 DIAGNOSIS — E0590 Thyrotoxicosis, unspecified without thyrotoxic crisis or storm: Secondary | ICD-10-CM

## 2017-12-04 DIAGNOSIS — R42 Dizziness and giddiness: Secondary | ICD-10-CM

## 2017-12-04 LAB — THYROID PANEL WITH TSH
Free Thyroxine Index: 3.3 (ref 1.2–4.9)
T3 Uptake Ratio: 31 % (ref 24–39)
T4, Total: 10.5 ug/dL (ref 4.5–12.0)
TSH: 0.03 u[IU]/mL — AB (ref 0.450–4.500)

## 2017-12-08 ENCOUNTER — Telehealth: Payer: Self-pay | Admitting: Family Medicine

## 2017-12-08 NOTE — Telephone Encounter (Signed)
Patient called requesting symptoms of low thyroid.  Symptoms given to patient.  Patient also asked if referral has been placed.  Informed patient that referral was placed on 12/04/17

## 2017-12-09 ENCOUNTER — Other Ambulatory Visit: Payer: Self-pay | Admitting: Family Medicine

## 2017-12-09 ENCOUNTER — Telehealth: Payer: Self-pay | Admitting: *Deleted

## 2017-12-09 MED ORDER — BUSPIRONE HCL 30 MG PO TABS: 30.0000 mg | ORAL_TABLET | Freq: Two times a day (BID) | ORAL | 1 refills | Status: DC

## 2017-12-09 NOTE — Telephone Encounter (Signed)
BuSpar 30 mg twice daily if needed #31 refill

## 2017-12-09 NOTE — Telephone Encounter (Signed)
Notified of RX Verbalizes understanding

## 2017-12-10 ENCOUNTER — Other Ambulatory Visit: Payer: Self-pay | Admitting: Nurse Practitioner

## 2017-12-10 ENCOUNTER — Telehealth: Payer: Self-pay | Admitting: Family Medicine

## 2017-12-10 DIAGNOSIS — E0590 Thyrotoxicosis, unspecified without thyrotoxic crisis or storm: Secondary | ICD-10-CM

## 2017-12-10 DIAGNOSIS — R2689 Other abnormalities of gait and mobility: Secondary | ICD-10-CM

## 2017-12-10 DIAGNOSIS — R42 Dizziness and giddiness: Secondary | ICD-10-CM

## 2017-12-10 NOTE — Telephone Encounter (Signed)
Dr. Soyla Murphy or I think Dr. Patrecia Pour are both endocrinologists

## 2017-12-10 NOTE — Telephone Encounter (Signed)
Who would you recommend?

## 2017-12-10 NOTE — Telephone Encounter (Signed)
Pt son aware. 

## 2017-12-11 ENCOUNTER — Telehealth: Payer: Self-pay | Admitting: Family Medicine

## 2017-12-15 ENCOUNTER — Telehealth: Payer: Self-pay | Admitting: Family Medicine

## 2017-12-17 ENCOUNTER — Telehealth: Payer: Self-pay | Admitting: Family Medicine

## 2017-12-17 NOTE — Telephone Encounter (Signed)
Pt checking on referral - aware that we would check into it. Kristy [name]

## 2017-12-24 DIAGNOSIS — H401133 Primary open-angle glaucoma, bilateral, severe stage: Secondary | ICD-10-CM | POA: Diagnosis not present

## 2017-12-24 NOTE — Telephone Encounter (Signed)
Refer to message from 3/20

## 2017-12-29 ENCOUNTER — Ambulatory Visit: Payer: Medicare Other | Admitting: "Endocrinology

## 2017-12-31 ENCOUNTER — Ambulatory Visit (INDEPENDENT_AMBULATORY_CARE_PROVIDER_SITE_OTHER): Payer: Medicare Other | Admitting: *Deleted

## 2017-12-31 VITALS — BP 134/80 | HR 70 | Temp 97.7°F | Ht 62.5 in | Wt 170.0 lb

## 2017-12-31 DIAGNOSIS — Z0000 Encounter for general adult medical examination without abnormal findings: Secondary | ICD-10-CM | POA: Diagnosis not present

## 2017-12-31 NOTE — Progress Notes (Addendum)
Subjective:   Kristy [name] is a 84 y.o. female who presents for Medicare Annual (Subsequent) preventive examination. She is retired from many years in the [employer]. She enjoys going out to eat and playing cards. She doesn't have a regular exercise routine, but states that housework is a lot for her. She says that she eats semi-healthy and usually has 2 meals a day. She attends [employer] regularly. She lives at home with her husband and they have one living son who is local. They do not have any pets and she is aware of fall risks. She states that her health is about the same as it was a year ago.   Cardiac Risk Factors include: hypertension;dyslipidemia     Objective:     Vitals: Ht 5' 2.5" (1.588 m)   Wt 170 lb (77.1 kg)   BMI 30.60 kg/m   Body mass index is 30.6 kg/m.  Advanced Directives 12/31/2017 06/07/2016 03/29/2015 11/16/2014 03/07/2014 02/28/2014 01/18/2013  Does Patient Have a Medical Advance Directive? Yes Yes Yes No Patient has advance directive, copy not in chart Patient has advance directive, copy not in chart Patient has advance directive, copy in chart  Type of Advance Directive [place];Living will [place];Living will [place];Living will - [place];Living will [place];Living will -  Does patient want to make changes to medical advance directive? - No - Patient declined No - Patient declined - No change requested No change requested -  Copy of [place] in Chart? Yes No - copy requested No - copy requested - Copy requested from family (No Data) -  Would patient like information on creating a medical advance directive? - - - No - patient declined information - - -  Pre-existing out of facility DNR order (yellow form or pink MOST form) - - - - No No No    Tobacco Social History   Tobacco Use  Smoking Status Never Smoker  Smokeless Tobacco Never Used    Counseling given: Not Answered   Clinical Intake:                       Past Medical History:  Diagnosis Date  . Anxiety   . Colon polyps   . Complication of anesthesia 01-18-2013   slow to awaken and felt crazy for 3-4 days  . Diabetes mellitus   . Diverticulitis   . H/O hiatal hernia   . Hearing loss 01-07-13   bilateral hearing aids  . History of shingles 01-07-13   3'14 recent outbreak- drying in   . Hypercholesteremia   . Hypertension    Dr. Laurance Flatten  . Lumbar spondylosis    osteoarthritis-knees  . Macular degeneration of both eyes    and dry eyes   Past Surgical History:  Procedure Laterality Date  . ABDOMINAL HYSTERECTOMY  1987  . APPENDECTOMY     pt thinks this was removed when gall bladder was taken out  . CATARACT EXTRACTION    . CHOLECYSTECTOMY  1989   open  . [name] ARTHROSCOPY  01-07-13   left [name]- many yrs ago  . Lumbar back surgery  2013  . TOTAL [name] ARTHROPLASTY Left 01/18/2013   Procedure: TOTAL [name] ARTHROPLASTY;  Surgeon: Gearlean Alf, MD;  Location: [facility];  Service: Orthopedics;  Laterality: Left;  . TOTAL [name] ARTHROPLASTY Right 03/07/2014   Procedure: RIGHT TOTAL [name] ARTHROPLASTY;  Surgeon:  Gearlean Alf, MD;  Location: [facility];  Service: Orthopedics;  Laterality: Right;  . VENTRAL HERNIA REPAIR     Family History  Problem Relation Age of Onset  . Stroke Father 67  . Heart disease Father   . Irregular heart beat Father        pacemaker   . Healthy Mother   . Healthy Son   . Heart disease Sister        unknown heart condition   . Bipolar disorder Son   . COPD Son   . Cancer Sister        colon   Social History   Socioeconomic History  . Marital status: Married    Spouse name: [name]   . Number of children: 2  . Years of education: Not on file  . Highest education level: Not on file  Occupational History  . Occupation: retired    Comment: Teaching laboratory technician  . Financial resource strain: Not on file  . Food  insecurity:    Worry: Not on file    Inability: Not on file  . Transportation needs:    Medical: Not on file    Non-medical: Not on file  Tobacco Use  . Smoking status: Never Smoker  . Smokeless tobacco: Never Used  Substance and Sexual Activity  . Alcohol use: No    Alcohol/week: 0.0 oz  . Drug use: No  . Sexual activity: Not Currently  Lifestyle  . Physical activity:    Days per week: Not on file    Minutes per session: Not on file  . Stress: Not on file  Relationships  . Social connections:    Talks on phone: Not on file    Gets together: Not on file    Attends religious service: Not on file    Active member of club or organization: Not on file    Attends meetings of clubs or organizations: Not on file    Relationship status: Not on file  Other Topics Concern  . Not on file  Social History Narrative   Lives with husband    Outpatient Encounter Medications as of 12/31/2017  Medication Sig  . ADVOCATE LANCETS MISC Check Blood Sugar bid. Dx E11.9  . ALPRAZolam (XANAX) 0.25 MG tablet TAKE 1/2 TABLET BY MOUTH TWICE A DAY AND 1 AT BEDTIME  . aspirin EC 81 MG tablet Take 81 mg by mouth daily.  . busPIRone (BUSPAR) 30 MG tablet Take 1 tablet (30 mg total) by mouth 2 (two) times daily.  . cholecalciferol (VITAMIN D) 1000 UNITS tablet Take 1,000 Units by mouth daily.  Marland Kitchen esomeprazole (NEXIUM) 40 MG capsule Take 1 capsule (40 mg total) by mouth daily. (Patient taking differently: Take 40 mg by mouth as needed (reflux). )  . ezetimibe (ZETIA) 10 MG tablet Take 1 tablet (10 mg total) by mouth daily.  . furosemide (LASIX) 40 MG tablet Take 40 mg by mouth 2 (two) times daily as needed for fluid.   Marland Kitchen glimepiride (AMARYL) 4 MG tablet TAKE 1 TABLET (4 MG TOTAL) BY MOUTH DAILY WITH BREAKFAST.  Marland Kitchen glucose blood (FREESTYLE TEST STRIPS) test strip Check Blood sugar two times daily  . hydroxypropyl methylcellulose (ISOPTO TEARS) 2.5 % ophthalmic solution Place 1 drop into both eyes 2 (two) times  daily as needed (dry eyes).  Marland Kitchen lisinopril (PRINIVIL,ZESTRIL) 10 MG tablet TAKE 1/2 TABLET DAILY AS DIRECTED  . LUMIGAN 0.01 % SOLN   . metFORMIN (GLUCOPHAGE) 1000 MG  tablet TAKE 1 TABLET BY MOUTH WITH MEALS BREAKFAST AND SUPPER  . metoprolol succinate (TOPROL-XL) 25 MG 24 hr tablet TAKE 1 TABLET BY MOUTH EVERY DAY  . Multiple Vitamins-Minerals (PRESERVISION AREDS 2 PO) Take by mouth.  . Omega-3 Fatty Acids (FISH OIL) 1000 MG CAPS Take 1 capsule by mouth daily.  . pravastatin (PRAVACHOL) 80 MG tablet TAKE ONE TABLET BY MOUTH AT BEDTIME   No facility-administered encounter medications on file as of 12/31/2017.     Activities of Daily Living In your present state of health, do you have any difficulty performing the following activities: 12/31/2017  Hearing? Y  Vision? Y  Comment mac degeneration  Difficulty concentrating or making decisions? N  Walking or climbing stairs? N  Dressing or bathing? N  Doing errands, shopping? Y  Comment does not Medical illustrator and eating ? N  Using the Toilet? N  In the past six months, have you accidently leaked urine? N  Do you have problems with loss of bowel control? N  Managing your Medications? N  Managing your Finances? N  Housekeeping or managing your Housekeeping? N  Some recent data might be hidden    Patient Care Team: Chipper Herb, MD as PCP - General (Family Medicine) Gaynelle Arabian, MD as Consulting Physician (Orthopedic Surgery) Minus Breeding, MD as Consulting Physician (Cardiology) Irine Seal, MD as Attending Physician (Urology) Sable Feil, MD as Consulting Physician (Gastroenterology) Bond, Tracie Harrier, MD as Referring Physician (Ophthalmology)    Assessment:   This is a routine wellness examination for Kristy [name].  Exercise Activities and Dietary recommendations Current Exercise Habits: Home exercise routine, Type of exercise: Other - see comments, Time (Minutes): 15, Frequency (Times/Week): 4, Weekly Exercise  (Minutes/Week): 60, Intensity: Mild, Exercise limited by: None identified  Goals    . Increase physical activity    . Prevent falls       Fall Risk Fall Risk  12/31/2017 12/03/2017 10/15/2017 05/27/2017 04/25/2017  Falls in the past year? Yes Yes No Yes Yes  Number falls in past yr: 1 1 - 1 1  Injury with Fall? No No - No No  Comment - - - - -  Risk for fall due to : - - - - -  Follow up - - - - -   Is the patient's home free of loose throw rugs in walkways, pet beds, electrical cords, etc?  Fall hazards and risks were discussed today.   Depression Screen PHQ 2/9 Scores 12/31/2017 12/03/2017 10/15/2017 05/27/2017  PHQ - 2 Score 0 0 0 0     Cognitive Function MMSE - Mini Mental State Exam 12/31/2017 12/03/2017 06/07/2016 03/29/2015  Not completed: - Unable to complete - -  Orientation to time _0 Orientation to Place _1 Registration _2 Attention/ Calculation _3 Recall _4 Language- name 2 objects _5 Language- repeat _6 Language- follow 3 step command _7 Language- read & follow direction 0 - 1 1  Write a sentence _8 Copy design 0 - 0 -  Copy design-comments - - due to visual impairment -  Total score 27 - 28 -        Immunization History  Administered Date(s) Administered  . Influenza, High Dose Seasonal PF 07/01/2016, 06/26/2017  . Influenza,inj,Quad PF,6+ Mos 07/06/2013, 07/18/2014, 07/07/2015  .  Pneumococcal Conjugate-13 09/21/2013  . Pneumococcal Polysaccharide-23 08/19/1995  . Tdap 07/16/2011  . Zoster 07/01/2007    Qualifies for Shingles Vaccine? Has had the zostavax and declines the shingrix today  Screening Tests Health Maintenance  Topic Date Due  . PNA vac Low Risk Adult (2 of 2 - PPSV23) 10/15/2018 (Originally 09/21/2014)  . HEMOGLOBIN A1C  04/09/2018  . INFLUENZA VACCINE  04/30/2018  . FOOT EXAM  10/15/2018  . OPHTHALMOLOGY EXAM  12/25/2018  . TETANUS/TDAP  07/15/2021  . DEXA SCAN  10/15/2022    Cancer  Screenings: Lung: Low Dose CT Chest recommended if Age 2-80 years, 30 pack-year currently smoking OR have quit w/in 15years. Patient does qualify. Breast:  Up to date on Mammogram? No  - declines further mammograms Up to date of Bone Density/Dexa? Yes Colorectal: due - has one at home   Additional Screenings: declines: Hepatitis C Screening:      Plan:   pt is to follow up Dr Laurance Flatten and other specialist. She declines further mammograms and all other health maint, is up to date.  I have personally reviewed and noted the following in the patient's chart:   . Medical and social history . Use of alcohol, tobacco or illicit drugs  . Current medications and supplements . Functional ability and status . Nutritional status . Physical activity . Advanced directives . List of other physicians . Hospitalizations, surgeries, and ER visits in previous 12 months . Vitals . Screenings to include cognitive, depression, and falls . Referrals and appointments  In addition, I have reviewed and discussed with patient certain preventive protocols, quality metrics, and best practice recommendations. A written personalized care plan for preventive services as well as general preventive health recommendations were provided to patient.     Dorcus Riga, Cameron Proud, LPN  04/03/6432   I have reviewed Nurse Bullin's note and agree with the above note.  Patient to f/u w/ PCP, Dr Laurance Flatten as scheduled.  Ashly M. Lajuana Ripple, [facility]

## 2017-12-31 NOTE — Patient Instructions (Signed)
  Kristy [name] , Thank you for taking time to come for your Medicare Wellness Visit. I appreciate your ongoing commitment to your health goals. Please review the following plan we discussed and let me know if I can assist you in the future.   These are the goals we discussed: Goals    . Increase physical activity    . Prevent falls       This is a list of the screening recommended for you and due dates:  Health Maintenance  Topic Date Due  . Pneumonia vaccines (2 of 2 - PPSV23) 10/15/2018*  . Hemoglobin A1C  04/09/2018  . Flu Shot  04/30/2018  . Complete foot exam   10/15/2018  . Eye exam for diabetics  12/25/2018  . Tetanus Vaccine  07/15/2021  . DEXA scan (bone density measurement)  10/15/2022  *Topic was postponed. The date shown is not the original due date.    Keep follow up with Dr Laurance Flatten and other specialist. We will continue to work on the endocrine appt - Jackelyn Poling should be contacting you soon

## 2018-01-07 ENCOUNTER — Encounter: Payer: Self-pay | Admitting: Family Medicine

## 2018-01-07 ENCOUNTER — Other Ambulatory Visit (HOSPITAL_COMMUNITY): Payer: Self-pay | Admitting: Endocrinology

## 2018-01-07 DIAGNOSIS — E0590 Thyrotoxicosis, unspecified without thyrotoxic crisis or storm: Secondary | ICD-10-CM

## 2018-01-14 ENCOUNTER — Encounter (HOSPITAL_COMMUNITY): Payer: Self-pay

## 2018-01-14 ENCOUNTER — Encounter (HOSPITAL_COMMUNITY): Payer: Medicare Other

## 2018-01-15 ENCOUNTER — Other Ambulatory Visit (HOSPITAL_COMMUNITY): Payer: Medicare Other

## 2018-01-19 ENCOUNTER — Telehealth: Payer: Self-pay | Admitting: Family Medicine

## 2018-01-19 NOTE — Telephone Encounter (Signed)
Pt aware.

## 2018-01-21 ENCOUNTER — Other Ambulatory Visit: Payer: Self-pay | Admitting: *Deleted

## 2018-01-21 ENCOUNTER — Encounter: Payer: Self-pay | Admitting: Family Medicine

## 2018-01-21 ENCOUNTER — Ambulatory Visit (INDEPENDENT_AMBULATORY_CARE_PROVIDER_SITE_OTHER): Payer: Medicare Other | Admitting: Family Medicine

## 2018-01-21 VITALS — BP 117/72 | HR 82 | Temp 97.2°F | Ht 62.5 in | Wt 167.0 lb

## 2018-01-21 DIAGNOSIS — E0590 Thyrotoxicosis, unspecified without thyrotoxic crisis or storm: Secondary | ICD-10-CM

## 2018-01-21 DIAGNOSIS — E1122 Type 2 diabetes mellitus with diabetic chronic kidney disease: Secondary | ICD-10-CM

## 2018-01-21 DIAGNOSIS — H547 Unspecified visual loss: Secondary | ICD-10-CM

## 2018-01-21 DIAGNOSIS — N183 Chronic kidney disease, stage 3 unspecified: Secondary | ICD-10-CM

## 2018-01-21 DIAGNOSIS — F419 Anxiety disorder, unspecified: Secondary | ICD-10-CM | POA: Diagnosis not present

## 2018-01-21 DIAGNOSIS — H9193 Unspecified hearing loss, bilateral: Secondary | ICD-10-CM

## 2018-01-21 MED ORDER — BUSPIRONE HCL 30 MG PO TABS: 30.0000 mg | ORAL_TABLET | Freq: Two times a day (BID) | ORAL | 1 refills | Status: DC

## 2018-01-21 NOTE — Progress Notes (Signed)
Subjective:    Patient ID: Kristy [name], female    DOB: June 10, 1928, 84 y.o.   MRN: [id number]  HPI Patient here today for follow up on anxiety.  The patient continues to have anxiety.  She was sent to the endocrinologist and a scan was done and no further recommendations were made.  She comes to the visit today with her husband and with her son.  She continues to have problems with anxiety some nausea and hot spells occasionally.  On reviewing the medicines they did not bring in any bottle that had BuSpar on it or pravastatin on it.  The son will help reconcile this with the nurse when he returns home.  The patient was referred to the endocrinologist because of a low TSH.  We called the pharmacy and the prescription for BuSpar that was called in back in March was not continued because they only had a limited number of pills and the patient never went back to pick up a new prescription for this.  Also today they did not find a prescription for her pravastatin.  Recent office visits and lab work was reviewed with the patient and her family.  The patient today has no specific complaints other than ongoing hearing impairment visual impairment and increasing memory impairment which is made worse by not being able to see or hear.  Gust all of this with the patient in front of [place] and with the husband and the son.  They did go see the thyroid specialist and she felt that the thyroid was normal and did not get any scans.  They do have a return appointment in about 4 weeks.  The lab work may have been repeated and may be the TSH came back normal and S1 none of the additional studies were done.  Were trying to get a copy of the lab work from the endocrinologist.  The patient is forgetful.     Patient Active Problem List   Diagnosis Date Noted  . Incisional hernia, without obstruction or gangrene 05/27/2017  . Type 2 diabetes mellitus with stage 3 chronic kidney disease ([place]) 07/07/2015  . Type 2 diabetes  mellitus with chronic kidney disease and hypertension ([place]) 07/07/2015  . SVT (supraventricular tachycardia) ([place]) 02/08/2015  . Depression 08/24/2013  . Essential hypertension, benign 06/08/2013  . Hyperlipidemia associated with type 2 diabetes mellitus ([place]) 06/08/2013  . Vitamin D deficiency 06/08/2013  . Diabetes mellitus type 2 controlled 06/08/2013  . OA (osteoarthritis) of knee 01/18/2013  . CLL (chronic lymphocytic leukemia) ([place]) 06/17/2012   Outpatient Encounter Medications as of 01/21/2018  Medication Sig  . ADVOCATE LANCETS MISC Check Blood Sugar bid. Dx E11.9  . ALPRAZolam (XANAX) 0.25 MG tablet TAKE 1/2 TABLET BY MOUTH TWICE A DAY AND 1 AT BEDTIME  . cholecalciferol (VITAMIN D) 1000 UNITS tablet Take 1,000 Units by mouth daily.  Marland Kitchen ezetimibe (ZETIA) 10 MG tablet Take 1 tablet (10 mg total) by mouth daily.  Marland Kitchen glimepiride (AMARYL) 4 MG tablet TAKE 1 TABLET (4 MG TOTAL) BY MOUTH DAILY WITH BREAKFAST.  Marland Kitchen glucose blood (FREESTYLE TEST STRIPS) test strip Check Blood sugar two times daily  . hydroxypropyl methylcellulose (ISOPTO TEARS) 2.5 % ophthalmic solution Place 1 drop into both eyes 2 (two) times daily as needed (dry eyes).  Marland Kitchen lisinopril (PRINIVIL,ZESTRIL) 10 MG tablet TAKE 1/2 TABLET DAILY AS DIRECTED  . LUMIGAN 0.01 % SOLN   . metFORMIN (GLUCOPHAGE) 1000 MG tablet TAKE 1 TABLET BY MOUTH WITH MEALS  BREAKFAST AND SUPPER  . metoprolol succinate (TOPROL-XL) 25 MG 24 hr tablet TAKE 1 TABLET BY MOUTH EVERY DAY  . Multiple Vitamins-Minerals (PRESERVISION AREDS 2 PO) Take by mouth.  . Omega-3 Fatty Acids (FISH OIL) 1000 MG CAPS Take 1 capsule by mouth daily.  . [DISCONTINUED] esomeprazole (NEXIUM) 40 MG capsule Take 1 capsule (40 mg total) by mouth daily. (Patient taking differently: Take 40 mg by mouth as needed (reflux). )  . busPIRone (BUSPAR) 30 MG tablet Take 1 tablet (30 mg total) by mouth 2 (two) times daily.  . furosemide (LASIX) 40 MG tablet Take 40 mg by mouth 2 (two) times  daily as needed for fluid.   . pravastatin (PRAVACHOL) 80 MG tablet TAKE ONE TABLET BY MOUTH AT BEDTIME (Patient not taking: Reported on 01/21/2018)  . [DISCONTINUED] aspirin EC 81 MG tablet Take 81 mg by mouth daily.   No facility-administered encounter medications on file as of 01/21/2018.      Review of Systems  Constitutional: Positive for fatigue.  HENT: Negative.   Eyes: Negative.   Respiratory: Negative.   Cardiovascular: Negative.   Gastrointestinal: Negative.   Endocrine: Positive for heat intolerance. Polyphagia: sensations of heat   Genitourinary: Negative.   Musculoskeletal: Negative.   Skin: Negative.   Allergic/Immunologic: Negative.   Neurological: Positive for weakness.  Hematological: Negative.   Psychiatric/Behavioral: Negative.        Objective:   Physical Exam  Constitutional: She is oriented to person, place, and time. She appears well-developed and well-nourished. She appears distressed.  It is obviously having problems with seeing and hearing and left her hearing aid at home today.  I feel very strongly that this is contributing to rest that she is experiencing plus may be some mild cognitive dementia.  HENT:  Head: Normocephalic and atraumatic.  Eyes: Pupils are equal, round, and reactive to light. Conjunctivae and EOM are normal. Right eye exhibits no discharge. Left eye exhibits no discharge. No scleral icterus.  Neck: Normal range of motion. Neck supple. No thyromegaly present.  Cardiovascular: Normal rate, regular rhythm and normal heart sounds.  No murmur heard. Pulmonary/Chest: Effort normal and breath sounds normal. No respiratory distress. She has no wheezes. She has no rales.  Abdominal: Soft. Bowel sounds are normal. She exhibits no mass. There is no tenderness. There is no rebound and no guarding.  Musculoskeletal: Normal range of motion. She exhibits no edema.  Lymphadenopathy:    She has no cervical adenopathy.  Neurological: She is alert and  oriented to person, place, and time.  Skin: Skin is warm and dry. No rash noted.  Psychiatric: She has a normal mood and affect. Her behavior is normal. Judgment and thought content normal.  Nursing note and vitals reviewed.  BP 117/72 (BP Location: Left Arm)   Pulse 82   Temp (!) 97.2 F (36.2 C) (Oral)   Ht 5' 2.5" (1.588 m)   Wt 167 lb (75.8 kg)   BMI 30.06 kg/m   We have called the endocrinologist office to try to obtain a record of the lab work that was done that resulted in not doing a thyroid scan.  We will today repeat her thyroid profile here since either the referral office had an incorrect TSH or we had an incorrect TSH.      Assessment & Plan:  1. Hyperthyroidism -Follow-up with endocrinologist - Thyroid Panel With TSH  2. Anxiety -BuSpar 30 mg twice daily  3. Impaired vision -Understand from the family that all  that can be done has been done with her vision.  4. Hearing impaired person, bilateral -Needs to wear her hearing aids regularly  5. Type 2 diabetes mellitus with stage 3 chronic kidney disease, without long-term current use of insulin (HCC) -We will check blood sugars at home fasting and during the day and bring readings by in a couple weeks for [place] to review  No orders of the defined types were placed in this encounter.  Patient Instructions  We will repeat the thyroid profile today and try to reconcile this with the endocrinologist results We will get a copy of any lab work done by the endocrinologist also Follow-up with the endocrinologist as recommended by her. Follow-up with [place] in 4 weeks Consider getting a 4-hour person to stay with the patient while her husband is out of the house Try BuSpar 30 mg twice daily in addition to the Xanax Feedback within a couple weeks to let us know how this is working Check blood sugars more regularly and bring readings in for review in a couple of weeks  Arrie Senate MD

## 2018-01-21 NOTE — Patient Instructions (Signed)
We will repeat the thyroid profile today and try to reconcile this with the endocrinologist results We will get a copy of any lab work done by the endocrinologist also Follow-up with the endocrinologist as recommended by her. Follow-up with [place] in 4 weeks Consider getting a 4-hour person to stay with the patient while her husband is out of the house Try BuSpar 30 mg twice daily in addition to the Xanax Feedback within a couple weeks to let us know how this is working Check blood sugars more regularly and bring readings in for review in a couple of weeks

## 2018-01-22 LAB — THYROID PANEL WITH TSH
FREE THYROXINE INDEX: 2.5 (ref 1.2–4.9)
T3 Uptake Ratio: 28 % (ref 24–39)
T4, Total: 8.9 ug/dL (ref 4.5–12.0)
TSH: 1.43 u[IU]/mL (ref 0.450–4.500)

## 2018-01-28 ENCOUNTER — Ambulatory Visit: Payer: Medicare Other | Admitting: Family Medicine

## 2018-02-03 ENCOUNTER — Other Ambulatory Visit: Payer: Self-pay | Admitting: Family Medicine

## 2018-02-05 ENCOUNTER — Telehealth: Payer: Self-pay | Admitting: Neurology

## 2018-02-05 ENCOUNTER — Encounter

## 2018-02-05 ENCOUNTER — Encounter: Payer: Self-pay | Admitting: Neurology

## 2018-02-05 ENCOUNTER — Ambulatory Visit: Payer: Medicare Other | Admitting: Neurology

## 2018-02-05 VITALS — BP 148/75 | HR 70 | Ht 62.0 in | Wt 168.0 lb

## 2018-02-05 DIAGNOSIS — H81399 Other peripheral vertigo, unspecified ear: Secondary | ICD-10-CM | POA: Diagnosis not present

## 2018-02-05 DIAGNOSIS — H903 Sensorineural hearing loss, bilateral: Secondary | ICD-10-CM

## 2018-02-05 DIAGNOSIS — R42 Dizziness and giddiness: Secondary | ICD-10-CM

## 2018-02-05 DIAGNOSIS — H9190 Unspecified hearing loss, unspecified ear: Secondary | ICD-10-CM

## 2018-02-05 HISTORY — DX: Unspecified hearing loss, unspecified ear: H91.90

## 2018-02-05 NOTE — Telephone Encounter (Signed)
[employer] medicare order sent to GI. No auth they will reach out to the pt to schedule.  °

## 2018-02-05 NOTE — Progress Notes (Signed)
Reason for visit: Dizziness  Referring physician: Dr. Leitha Bleak Kristy [name] is a 84 y.o. female  History of present illness:  Kristy [name] is an 84 year old right-handed white female with a history of chronic episodic dizziness.  The patient herself is not an excellent historian, she comes in today with her husband and her daughter.  Apparently she has been having some symptoms over the last 1 year, there has been no progression of her symptoms.  She may have episodes on a daily basis, multiple times during the day.  She has symptoms only when she is standing up, not with sitting or lying down.  She has had a gradual change in her vision associated with macular degeneration, she has been more careful about her walking, she feels somewhat tenuous about her balance.  She will note occasional episodes of dizziness or possibly vertigo, with a sense that she is veering to the right.  The patient herself does not give a consistent history of this, her husband indicates this is what she has described in the past.  The patient may at times feels as if her heart is racing, there is no chest pain or palpitations of the heart.  The patient denies any focal numbness or weakness of the face, arms, or legs.  She denies any headaches, she does have decreased hearing, right greater than left.  She has no ringing in the ears.  She denies any issues controlling the bowels or the bladder.  She does not have a prior history of blackout episodes.  She has not fallen with the dizziness.  The patient reports no new medications that were added or taken away or altered in dosing around the time of onset of symptoms.  The patient has undergone a CT scan of the brain that is relatively unremarkable.  She is sent to this office for an evaluation.  The patient does not use a cane or a walker for ambulation.  Past Medical History:  Diagnosis Date  . Anxiety   . Colon polyps   . Complication of anesthesia 01-18-2013   slow to  awaken and felt crazy for 3-4 days  . Diabetes mellitus   . Diverticulitis   . H/O hiatal hernia   . Hearing loss 01-07-13   bilateral hearing aids  . History of shingles 01-07-13   3'14 recent outbreak- drying in   . Hypercholesteremia   . Hypertension    Dr. Laurance Flatten  . Lumbar spondylosis    osteoarthritis-knees  . Macular degeneration of both eyes    and dry eyes    Past Surgical History:  Procedure Laterality Date  . ABDOMINAL HYSTERECTOMY  1987  . APPENDECTOMY     pt thinks this was removed when gall bladder was taken out  . CATARACT EXTRACTION    . CHOLECYSTECTOMY  1989   open  . KNEE ARTHROSCOPY  01-07-13   left knee- many yrs ago  . Lumbar back surgery  2013  . TOTAL KNEE ARTHROPLASTY Left 01/18/2013   Procedure: TOTAL KNEE ARTHROPLASTY;  Surgeon: Gearlean Alf, MD;  Location: [facility];  Service: Orthopedics;  Laterality: Left;  . TOTAL KNEE ARTHROPLASTY Right 03/07/2014   Procedure: RIGHT TOTAL KNEE ARTHROPLASTY;  Surgeon: Gearlean Alf, MD;  Location: [facility];  Service: Orthopedics;  Laterality: Right;  . VENTRAL HERNIA REPAIR      Family History  Problem Relation Age of Onset  . Stroke Father 72  . Heart disease Father   .  Irregular heart beat Father        pacemaker   . Healthy Mother   . Healthy Son   . Heart disease Sister        unknown heart condition   . Bipolar disorder Son   . COPD Son   . Cancer Sister        colon    Social history:  reports that she has never smoked. She has never used smokeless tobacco. She reports that she does not drink alcohol or use drugs.  Medications:  Prior to Admission medications   Medication Sig Start Date End Date Taking? Authorizing Provider  ADVOCATE LANCETS MISC Check Blood Sugar bid. Dx E11.9 11/28/14  Yes Chipper Herb, MD  ALPRAZolam Duanne Moron) 0.25 MG tablet TAKE 1/2 TABLET BY MOUTH TWICE A DAY AND 1 AT BEDTIME 12/10/17  Yes Chipper Herb, MD  busPIRone (BUSPAR) 30 MG tablet Take 1 tablet (30 mg total) by mouth  2 (two) times daily. 01/21/18  Yes Chipper Herb, MD  cholecalciferol (VITAMIN D) 1000 UNITS tablet Take 1,000 Units by mouth daily.   Yes [provider]  ezetimibe (ZETIA) 10 MG tablet Take 1 tablet (10 mg total) by mouth daily. 09/11/17  Yes Chipper Herb, MD  furosemide (LASIX) 40 MG tablet Take 40 mg by mouth 2 (two) times daily as needed for fluid.    Yes [provider]  glimepiride (AMARYL) 4 MG tablet TAKE 1 TABLET (4 MG TOTAL) BY MOUTH DAILY WITH BREAKFAST. 12/09/17  Yes Chipper Herb, MD  glucose blood (FREESTYLE TEST STRIPS) test strip Check Blood sugar two times daily 08/20/17  Yes Chipper Herb, MD  hydroxypropyl methylcellulose (ISOPTO TEARS) 2.5 % ophthalmic solution Place 1 drop into both eyes 2 (two) times daily as needed (dry eyes).   Yes [provider]  lisinopril (PRINIVIL,ZESTRIL) 10 MG tablet TAKE 1/2 TABLET DAILY AS DIRECTED 06/10/17  Yes Chipper Herb, MD  LUMIGAN 0.01 % SOLN  11/09/15  Yes [provider]  metFORMIN (GLUCOPHAGE) 1000 MG tablet TAKE 1 TABLET BY MOUTH WITH MEALS BREAKFAST AND SUPPER 11/26/17  Yes Chipper Herb, MD  metoprolol succinate (TOPROL-XL) 25 MG 24 hr tablet TAKE 1 TABLET BY MOUTH EVERY DAY 02/03/18  Yes Chipper Herb, MD  Multiple Vitamins-Minerals (PRESERVISION AREDS 2 PO) Take by mouth.   Yes [provider]  Omega-3 Fatty Acids (FISH OIL) 1000 MG CAPS Take 1 capsule by mouth daily.   Yes [provider]  pravastatin (PRAVACHOL) 80 MG tablet TAKE ONE TABLET BY MOUTH AT BEDTIME 10/27/17  Yes Chipper Herb, MD      Allergies  Allergen Reactions  . Vioxx [Rofecoxib] Other (See Comments)    Ulcers in mouth     ROS:  Out of a complete 14 system review of symptoms, the patient complains only of the following symptoms, and all other reviewed systems are negative.  Dizziness Hard of hearing Mild memory disturbance  Blood pressure (!) 148/75, pulse 70, height 5\' 2"  (1.575 m),  weight 168 lb (76.2 kg).   Blood pressure, right arm, sitting is 122/84.  Blood pressure, right arm, standing is 132/84.  Physical Exam  General: The patient is alert and cooperative at the time of the examination.  Eyes: Pupils are equal, round, and reactive to light. Discs are flat bilaterally.  Ears: Tympanic membrane is slightly sclerotic on the left, partially obscured with cerumen on the right, there is no cerumen plug.  Neck: The neck is supple, no carotid bruits are noted.  Respiratory: The respiratory examination is clear.  Cardiovascular: The cardiovascular examination reveals a regular rate and rhythm, no obvious murmurs or rubs are noted.  Skin: Extremities are without significant edema.  Neurologic Exam  Mental status: The patient is alert and oriented x 3 at the time of the examination. The patient has apparent normal recent and remote memory, with an apparently normal attention span and concentration ability.  Cranial nerves: Facial symmetry is present. There is good sensation of the face to pinprick and soft touch bilaterally. The strength of the facial muscles and the muscles to head turning and shoulder shrug are normal bilaterally. Speech is well enunciated, no aphasia or dysarthria is noted. Extraocular movements are full. Visual fields are full. The tongue is midline, and the patient has symmetric elevation of the soft palate. No obvious hearing deficits are noted.  The patient is hard of hearing.  Motor: The motor testing reveals 5 over 5 strength of all 4 extremities. Good symmetric motor tone is noted throughout.  Sensory: Sensory testing is intact to pinprick, soft touch, vibration sensation, and position sense on all 4 extremities. No evidence of extinction is noted.  Coordination: Cerebellar testing reveals good finger-nose-finger and heel-to-shin bilaterally.  Gait and station: Gait is associated with taking small steps, good arm swing.  The patient walks  slowly and deliberately.  Tandem gait is unsteady. Romberg is negative. No drift is seen.  Reflexes: Deep tendon reflexes are symmetric and normal bilaterally. Toes are downgoing bilaterally.   CT head 10/28/17:  IMPRESSION: No acute intracranial abnormality.  Mild chronic microvascular changes.  Mild atrophy.  * CT scan images were reviewed online. I agree with the written report.    Assessment/Plan:  1.  Mild gait disturbance  2.  Reports of dizziness, vertigo  The patient has episodic events of dizziness or vertigo.  It is difficult to get an accurate consistent description from the patient.  The etiology of this is not clear from the history or clinical examination.  The patient be set up for MRI of the brain, and a carotid Doppler study.  We may consider physical therapy for gait training.  The patient does not appear to have a peripheral neuropathy on clinical examination.  She does have failing visual acuity, she does appear to have a gait pattern that is somewhat tenuous as if she is unsure of herself with her balance.  The patient may have a sensation of imbalance that is interpreted as dizziness.  She will follow-up in 4 months.  Jill Alexanders MD 02/05/2018 8:28 AM  [facility] [address] [place], [place] [address]  Phone [phone] Fax [phone]

## 2018-02-07 ENCOUNTER — Other Ambulatory Visit: Payer: Self-pay | Admitting: Family Medicine

## 2018-02-10 ENCOUNTER — Ambulatory Visit
Admission: RE | Admit: 2018-02-10 | Discharge: 2018-02-10 | Disposition: A | Discharge status: Home / Self Care | Payer: Medicare Other | Source: Ambulatory Visit | Attending: Neurology | Admitting: Neurology

## 2018-02-10 DIAGNOSIS — R42 Dizziness and giddiness: Secondary | ICD-10-CM | POA: Diagnosis not present

## 2018-02-10 DIAGNOSIS — H81399 Other peripheral vertigo, unspecified ear: Secondary | ICD-10-CM

## 2018-02-12 ENCOUNTER — Telehealth: Payer: Self-pay | Admitting: Neurology

## 2018-02-12 ENCOUNTER — Other Ambulatory Visit: Payer: Medicare Other

## 2018-02-12 DIAGNOSIS — E1122 Type 2 diabetes mellitus with diabetic chronic kidney disease: Secondary | ICD-10-CM | POA: Diagnosis not present

## 2018-02-12 DIAGNOSIS — E0590 Thyrotoxicosis, unspecified without thyrotoxic crisis or storm: Secondary | ICD-10-CM | POA: Diagnosis not present

## 2018-02-12 DIAGNOSIS — E559 Vitamin D deficiency, unspecified: Secondary | ICD-10-CM | POA: Diagnosis not present

## 2018-02-12 DIAGNOSIS — E1169 Type 2 diabetes mellitus with other specified complication: Secondary | ICD-10-CM

## 2018-02-12 DIAGNOSIS — I10 Essential (primary) hypertension: Secondary | ICD-10-CM

## 2018-02-12 DIAGNOSIS — N183 Chronic kidney disease, stage 3 (moderate): Secondary | ICD-10-CM

## 2018-02-12 DIAGNOSIS — E785 Hyperlipidemia, unspecified: Secondary | ICD-10-CM

## 2018-02-12 DIAGNOSIS — C9110 Chronic lymphocytic leukemia of B-cell type not having achieved remission: Secondary | ICD-10-CM

## 2018-02-12 LAB — BAYER DCA HB A1C WAIVED: HB A1C (BAYER DCA - WAIVED): 6.9 % (ref <7.0)

## 2018-02-12 NOTE — Telephone Encounter (Signed)
I called the patient.  I talk with the husband.  The MRI of the brain shows mild white matter disease, significant cortical atrophy is noted.  The patient will go for carotid Doppler study, if this is unremarkable, we may consider physical therapy for her walking which appears to be slightly unsteady.  The patient does have failing vision which may be an additive factor.  The patient reports dizziness only with standing.   MRI brain 02/12/18:  IMPRESSION:   MRI brain (without) demonstrating: 1. Moderate perisylvian and mild generalized atrophy. 2. Mild periventricular and subcortical chronic small vessel ischemic disease. 3. No acute findings.

## 2018-02-13 ENCOUNTER — Encounter: Payer: Self-pay | Admitting: Family Medicine

## 2018-02-13 ENCOUNTER — Ambulatory Visit (INDEPENDENT_AMBULATORY_CARE_PROVIDER_SITE_OTHER): Payer: Medicare Other | Admitting: Family Medicine

## 2018-02-13 VITALS — BP 115/50 | HR 63 | Temp 96.7°F | Ht 62.0 in | Wt 168.0 lb

## 2018-02-13 DIAGNOSIS — Z6830 Body mass index (BMI) 30.0-30.9, adult: Secondary | ICD-10-CM

## 2018-02-13 DIAGNOSIS — H9193 Unspecified hearing loss, bilateral: Secondary | ICD-10-CM | POA: Diagnosis not present

## 2018-02-13 DIAGNOSIS — I10 Essential (primary) hypertension: Secondary | ICD-10-CM | POA: Diagnosis not present

## 2018-02-13 DIAGNOSIS — E1122 Type 2 diabetes mellitus with diabetic chronic kidney disease: Secondary | ICD-10-CM | POA: Diagnosis not present

## 2018-02-13 DIAGNOSIS — E0590 Thyrotoxicosis, unspecified without thyrotoxic crisis or storm: Secondary | ICD-10-CM

## 2018-02-13 DIAGNOSIS — E559 Vitamin D deficiency, unspecified: Secondary | ICD-10-CM | POA: Diagnosis not present

## 2018-02-13 DIAGNOSIS — H547 Unspecified visual loss: Secondary | ICD-10-CM

## 2018-02-13 DIAGNOSIS — N183 Chronic kidney disease, stage 3 (moderate): Secondary | ICD-10-CM | POA: Diagnosis not present

## 2018-02-13 LAB — CBC WITH DIFFERENTIAL/PLATELET
BASOS: 0 %
Basophils Absolute: 0 10*3/uL (ref 0.0–0.2)
EOS (ABSOLUTE): 0.1 10*3/uL (ref 0.0–0.4)
EOS: 1 %
HEMATOCRIT: 38.6 % (ref 34.0–46.6)
Hemoglobin: 13 g/dL (ref 11.1–15.9)
IMMATURE GRANULOCYTES: 0 %
Immature Grans (Abs): 0 10*3/uL (ref 0.0–0.1)
LYMPHS ABS: 8.5 10*3/uL — AB (ref 0.7–3.1)
Lymphs: 60 %
MCH: 30 pg (ref 26.6–33.0)
MCHC: 33.7 g/dL (ref 31.5–35.7)
MCV: 89 fL (ref 79–97)
MONOS ABS: 0.7 10*3/uL (ref 0.1–0.9)
Monocytes: 5 %
Neutrophils Absolute: 4.9 10*3/uL (ref 1.4–7.0)
Neutrophils: 34 %
PLATELETS: 173 10*3/uL (ref 150–379)
RBC: 4.34 x10E6/uL (ref 3.77–5.28)
RDW: 14.3 % (ref 12.3–15.4)
WBC: 14.3 10*3/uL — ABNORMAL HIGH (ref 3.4–10.8)

## 2018-02-13 LAB — HEPATIC FUNCTION PANEL
ALBUMIN: 3.9 g/dL (ref 3.5–4.7)
ALK PHOS: 48 IU/L (ref 39–117)
ALT: 13 IU/L (ref 0–32)
AST: 17 IU/L (ref 0–40)
BILIRUBIN TOTAL: 0.4 mg/dL (ref 0.0–1.2)
Bilirubin, Direct: 0.14 mg/dL (ref 0.00–0.40)
Total Protein: 5.9 g/dL — ABNORMAL LOW (ref 6.0–8.5)

## 2018-02-13 LAB — THYROID PANEL WITH TSH
Free Thyroxine Index: 2.2 (ref 1.2–4.9)
T3 UPTAKE RATIO: 28 % (ref 24–39)
T4 TOTAL: 8 ug/dL (ref 4.5–12.0)
TSH: 1.39 u[IU]/mL (ref 0.450–4.500)

## 2018-02-13 LAB — LIPID PANEL
CHOL/HDL RATIO: 2.3 ratio (ref 0.0–4.4)
Cholesterol, Total: 127 mg/dL (ref 100–199)
HDL: 56 mg/dL (ref >39)
LDL Calculated: 47 mg/dL (ref 0–99)
Triglycerides: 121 mg/dL (ref 0–149)
VLDL Cholesterol Cal: 24 mg/dL (ref 5–40)

## 2018-02-13 LAB — BMP8+EGFR
BUN / CREAT RATIO: 13 (ref 12–28)
BUN: 13 mg/dL (ref 8–27)
CO2: 20 mmol/L (ref 20–29)
CREATININE: 0.99 mg/dL (ref 0.57–1.00)
Calcium: 9.2 mg/dL (ref 8.7–10.3)
Chloride: 109 mmol/L — ABNORMAL HIGH (ref 96–106)
GFR, EST AFRICAN AMERICAN: 59 mL/min/{1.73_m2} — AB (ref >59)
GFR, EST NON AFRICAN AMERICAN: 51 mL/min/{1.73_m2} — AB (ref >59)
Glucose: 193 mg/dL — ABNORMAL HIGH (ref 65–99)
POTASSIUM: 4.7 mmol/L (ref 3.5–5.2)
SODIUM: 143 mmol/L (ref 134–144)

## 2018-02-13 LAB — VITAMIN D 25 HYDROXY (VIT D DEFICIENCY, FRACTURES): VIT D 25 HYDROXY: 51.8 ng/mL (ref 30.0–100.0)

## 2018-02-13 NOTE — Progress Notes (Signed)
Subjective:    Patient ID: Kristy [name], female    DOB: 02/11/1928, 84 y.o.   MRN: [id number]  HPI Pt here for follow up and management of chronic medical problems which includes hyperthyroid, hypertension and diabetes. She is taking medication regularly.  The patient comes to the visit today with her husband.  She is recently had blood work done and this will be reviewed with both of them during the visit today.  She has no specific complaints today.  She will be given an FOBT to return.  All of her cholesterol numbers with traditional lipid testing were good the BMP had a blood sugar that was elevated at 193 the creatinine and all of the electrolytes including potassium are good except the chloride was 109.  The CBC has a elevated white count as in the past because of her chronic lymphocytic leukemia.  The hemoglobin remains good at 13.0 and the platelet count was normal.  The vitamin D level was good at 51.8.  All thyroid tests were normal.  The total protein was slightly decreased as it has been in the past and all other liver function tests were normal.  Hemoglobin A1c was greatly improved at 6.9%.  She brings in blood sugars from the outside and they seem to be ranging around the 142 upper 160 range in the morning fasting and before dinner and the 120 range to 1 time as high as 250.  Her body mass index was 30.73.    Patient Active Problem List   Diagnosis Date Noted  . HOH (hard of hearing) 02/05/2018  . Incisional hernia, without obstruction or gangrene 05/27/2017  . Type 2 diabetes mellitus with stage 3 chronic kidney disease ([place]) 07/07/2015  . Type 2 diabetes mellitus with chronic kidney disease and hypertension ([place]) 07/07/2015  . SVT (supraventricular tachycardia) ([place]) 02/08/2015  . Depression 08/24/2013  . Essential hypertension, benign 06/08/2013  . Hyperlipidemia associated with type 2 diabetes mellitus ([place]) 06/08/2013  . Vitamin D deficiency 06/08/2013  . Diabetes mellitus  type 2 controlled 06/08/2013  . OA (osteoarthritis) of knee 01/18/2013  . CLL (chronic lymphocytic leukemia) ([place]) 06/17/2012   Outpatient Encounter Medications as of 02/13/2018  Medication Sig  . ADVOCATE LANCETS MISC Check Blood Sugar bid. Dx E11.9  . ALPRAZolam (XANAX) 0.25 MG tablet TAKE 1/2 TABLET BY MOUTH TWICE A DAY AND 1 AT BEDTIME  . busPIRone (BUSPAR) 30 MG tablet Take 1 tablet (30 mg total) by mouth 2 (two) times daily.  . cholecalciferol (VITAMIN D) 1000 UNITS tablet Take 1,000 Units by mouth daily.  Marland Kitchen ezetimibe (ZETIA) 10 MG tablet Take 1 tablet (10 mg total) by mouth daily.  . furosemide (LASIX) 40 MG tablet Take 40 mg by mouth 2 (two) times daily as needed for fluid.   Marland Kitchen glimepiride (AMARYL) 4 MG tablet TAKE 1 TABLET (4 MG TOTAL) BY MOUTH DAILY WITH BREAKFAST.  Marland Kitchen glucose blood (FREESTYLE TEST STRIPS) test strip Check Blood sugar two times daily  . hydroxypropyl methylcellulose (ISOPTO TEARS) 2.5 % ophthalmic solution Place 1 drop into both eyes 2 (two) times daily as needed (dry eyes).  Marland Kitchen lisinopril (PRINIVIL,ZESTRIL) 10 MG tablet TAKE 1/2 TABLET DAILY AS DIRECTED  . LUMIGAN 0.01 % SOLN   . metFORMIN (GLUCOPHAGE) 1000 MG tablet TAKE 1 TABLET BY MOUTH WITH MEALS BREAKFAST AND SUPPER  . metoprolol succinate (TOPROL-XL) 25 MG 24 hr tablet TAKE 1 TABLET BY MOUTH EVERY DAY  . Multiple Vitamins-Minerals (PRESERVISION AREDS 2 PO) Take  by mouth.  . Omega-3 Fatty Acids (FISH OIL) 1000 MG CAPS Take 1 capsule by mouth daily.  . pravastatin (PRAVACHOL) 80 MG tablet TAKE ONE TABLET BY MOUTH AT BEDTIME   No facility-administered encounter medications on file as of 02/13/2018.       Review of Systems  Constitutional: Negative.   HENT: Negative.   Eyes: Negative.   Respiratory: Negative.   Cardiovascular: Negative.   Gastrointestinal: Negative.   Endocrine: Negative.   Genitourinary: Negative.   Musculoskeletal: Negative.   Skin: Negative.   Allergic/Immunologic: Negative.     Neurological: Negative.   Hematological: Negative.   Psychiatric/Behavioral: Negative.        Objective:   Physical Exam  Constitutional: She is oriented to person, place, and time. She appears well-developed and well-nourished. No distress.  Patient seems to be in better spirits today and less stress.  She is concerned about an incisional hernia which we have talked about before but she does not have any pain with this.  She still has her vision deficiencies and this is her biggest concern and her husband's biggest concern because she is always worried about where he is and she cannot see well.  HENT:  Head: Normocephalic and atraumatic.  Right Ear: External ear normal.  Left Ear: External ear normal.  Nose: Nose normal.  Mouth/Throat: Oropharynx is clear and moist.  Eyes: Pupils are equal, round, and reactive to light. Conjunctivae and EOM are normal. Right eye exhibits no discharge. Left eye exhibits no discharge.  Diminished vision bilaterally and has been followed by ophthalmologist with no solutions for fixing this.  She has macular degeneration.  Neck: Normal range of motion. Neck supple. No thyromegaly present.  No bruits thyromegaly or anterior cervical adenopathy  Cardiovascular: Normal rate, regular rhythm, normal heart sounds and intact distal pulses.  No murmur heard. Heart is regular at 72/min  Pulmonary/Chest: Effort normal and breath sounds normal. She has no wheezes. She has no rales.  Clear anteriorly and posteriorly  Abdominal: Soft. Bowel sounds are normal. She exhibits mass. There is no tenderness. There is no rebound and no guarding.  Incisional hernia without tenderness in upper abdomen  Musculoskeletal: She exhibits no edema or tenderness.  Patient has stiffness with arising and some stiffness with movement and walking.  Lymphadenopathy:    She has no cervical adenopathy.  Neurological: She is alert and oriented to person, place, and time. She has normal  reflexes. No cranial nerve deficit.  Diminished vision bilaterally but can see grossly without fine detail  Skin: Skin is warm and dry. No rash noted.  Psychiatric: She has a normal mood and affect. Her behavior is normal. Judgment and thought content normal.  Patient's mood and affect was more appropriate today than in the past.  Nursing note and vitals reviewed.  BP (!) 115/50 (BP Location: Left Arm)   Pulse 63   Temp (!) 96.7 F (35.9 C) (Oral)   Ht 5\' 2"  (1.575 m)   Wt 168 lb (76.2 kg)   BMI 30.73 kg/m         Assessment & Plan:  1. Hyperthyroidism -The patient is now euthyroid without medication and this diagnosis may have been related to improper lab testing because she has had 2 TSHs since the low TSH and both have been normal.  2. Hearing impaired person, bilateral -.  Patient continues to wear hearing aids bilaterally  3. Impaired vision -Patient has macular degeneration and all that can be done has been  done to help her vision.  4. Type 2 diabetes mellitus with stage 3 chronic kidney disease, without long-term current use of insulin (HCC) -IMA globin A1c was much improved this time and I am pleased with that and this may be due to the fact that her son is now helping with her medicines and making sure that she is getting them taken at the proper time by distributing them out.  5. Essential hypertension, benign -The pressure is good today and she will continue with current treatment  6. Vitamin D deficiency -Continue with current treatment pending results of lab work  7. BMI 30.0-30.9,adult -She is encouraged to exercise as much as possible trying to lose weight with diet.  She is visually and hearing impaired and this makes things difficult.  Patient Instructions                       Medicare Annual Wellness Visit  [facility] and the medical providers at [facility] strive to bring you the best medical care.  In doing so we not only want  to address your current medical conditions and concerns but also to detect new conditions early and prevent illness, disease and health-related problems.    Medicare offers a yearly Wellness Visit which allows our clinical staff to assess your need for preventative services including immunizations, lifestyle education, counseling to decrease risk of preventable diseases and screening for fall risk and other medical concerns.    This visit is provided free of charge (no copay) for all Medicare recipients. The clinical pharmacists at [facility] have begun to conduct these Wellness Visits which will also include a thorough review of all your medications.    As you primary medical provider recommend that you make an appointment for your Annual Wellness Visit if you have not done so already this year.  You may set up this appointment before you leave today or you may call back ([phone]) and schedule an appointment.  Please make sure when you call that you mention that you are scheduling your Annual Wellness Visit with the clinical pharmacist so that the appointment may be made for the proper length of time.     Continue current medications. Continue good therapeutic lifestyle changes which include good diet and exercise. Fall precautions discussed with patient. If an FOBT was given today- please return it to our front desk. If you are over 42 years old - you may need Prevnar 67 or the adult Pneumonia vaccine.  **Flu shots are available--- please call and schedule a FLU-CLINIC appointment**  After your visit with [place] today you will receive a survey in the mail or online from [employer] regarding your care with [place]. Please take a moment to fill this out. Your feedback is very important to [place] as you can help [place] better understand your patient needs as well as improve your experience and satisfaction. WE CARE ABOUT YOU!!!     Arrie Senate MD

## 2018-02-13 NOTE — Patient Instructions (Signed)
Medicare Annual Wellness Visit  [facility] and the medical providers at [facility] strive to bring you the best medical care.  In doing so we not only want to address your current medical conditions and concerns but also to detect new conditions early and prevent illness, disease and health-related problems.    Medicare offers a yearly Wellness Visit which allows our clinical staff to assess your need for preventative services including immunizations, lifestyle education, counseling to decrease risk of preventable diseases and screening for fall risk and other medical concerns.    This visit is provided free of charge (no copay) for all Medicare recipients. The clinical pharmacists at [facility] have begun to conduct these Wellness Visits which will also include a thorough review of all your medications.    As you primary medical provider recommend that you make an appointment for your Annual Wellness Visit if you have not done so already this year.  You may set up this appointment before you leave today or you may call back ([phone]) and schedule an appointment.  Please make sure when you call that you mention that you are scheduling your Annual Wellness Visit with the clinical pharmacist so that the appointment may be made for the proper length of time.     Continue current medications. Continue good therapeutic lifestyle changes which include good diet and exercise. Fall precautions discussed with patient. If an FOBT was given today- please return it to our front desk. If you are over 50 years old - you may need Prevnar 13 or the adult Pneumonia vaccine.  **Flu shots are available--- please call and schedule a FLU-CLINIC appointment**  After your visit with us today you will receive a survey in the mail or online from [employer] regarding your care with us. Please take a moment to fill this out. Your feedback is very  important to us as you can help us better understand your patient needs as well as improve your experience and satisfaction. WE CARE ABOUT YOU!!!    

## 2018-02-18 ENCOUNTER — Ambulatory Visit: Payer: Medicare Other | Admitting: Family Medicine

## 2018-03-02 DIAGNOSIS — E0590 Thyrotoxicosis, unspecified without thyrotoxic crisis or storm: Secondary | ICD-10-CM | POA: Diagnosis not present

## 2018-03-10 ENCOUNTER — Telehealth: Payer: Self-pay | Admitting: Neurology

## 2018-03-10 ENCOUNTER — Ambulatory Visit (HOSPITAL_COMMUNITY)
Admission: RE | Admit: 2018-03-10 | Discharge: 2018-03-10 | Disposition: A | Discharge status: Home / Self Care | Payer: Medicare Other | Source: Ambulatory Visit | Attending: Neurology | Admitting: Neurology

## 2018-03-10 DIAGNOSIS — I6523 Occlusion and stenosis of bilateral carotid arteries: Secondary | ICD-10-CM | POA: Insufficient documentation

## 2018-03-10 DIAGNOSIS — E119 Type 2 diabetes mellitus without complications: Secondary | ICD-10-CM | POA: Diagnosis not present

## 2018-03-10 DIAGNOSIS — R42 Dizziness and giddiness: Secondary | ICD-10-CM | POA: Insufficient documentation

## 2018-03-10 DIAGNOSIS — I10 Essential (primary) hypertension: Secondary | ICD-10-CM | POA: Diagnosis not present

## 2018-03-10 DIAGNOSIS — E785 Hyperlipidemia, unspecified: Secondary | ICD-10-CM | POA: Diagnosis not present

## 2018-03-10 NOTE — Telephone Encounter (Signed)
  I called the patient, talk with husband, the carotid Doppler study is completely normal.  This does not explain her dizziness issue.  Carotid doppler 03/10/18:  Final Interpretation: Right Carotid: Velocities in the right ICA are consistent with a 1-39% stenosis.  Left Carotid: Velocities in the left ICA are consistent with a 1-39% stenosis.  Vertebrals: Bilateral vertebral arteries demonstrate antegrade flow. Subclavians: Normal flow hemodynamics were seen in bilateral subclavian       arteries.

## 2018-03-10 NOTE — Progress Notes (Signed)
*  PRELIMINARY RESULTS* Vascular Ultrasound Carotid Duplex (Doppler) has been completed.   Findings suggest 1-39% internal carotid artery stenosis bilaterally. Vertebral arteries are patent with antegrade flow.  03/10/2018 10:41 AM Maudry Mayhew, BS, RVT, RDCS, RDMS

## 2018-03-12 DIAGNOSIS — Z961 Presence of intraocular lens: Secondary | ICD-10-CM | POA: Insufficient documentation

## 2018-03-12 DIAGNOSIS — H26493 Other secondary cataract, bilateral: Secondary | ICD-10-CM | POA: Insufficient documentation

## 2018-03-12 DIAGNOSIS — H353124 Nonexudative age-related macular degeneration, left eye, advanced atrophic with subfoveal involvement: Secondary | ICD-10-CM | POA: Diagnosis not present

## 2018-03-12 DIAGNOSIS — H43813 Vitreous degeneration, bilateral: Secondary | ICD-10-CM | POA: Insufficient documentation

## 2018-03-12 DIAGNOSIS — H353114 Nonexudative age-related macular degeneration, right eye, advanced atrophic with subfoveal involvement: Secondary | ICD-10-CM | POA: Insufficient documentation

## 2018-03-12 DIAGNOSIS — H401133 Primary open-angle glaucoma, bilateral, severe stage: Secondary | ICD-10-CM | POA: Diagnosis not present

## 2018-03-25 ENCOUNTER — Other Ambulatory Visit: Payer: Self-pay | Admitting: Family Medicine

## 2018-03-25 ENCOUNTER — Telehealth: Payer: Self-pay | Admitting: Family Medicine

## 2018-03-25 NOTE — Telephone Encounter (Signed)
Pt would like to come by and review meds with the nurse - time was given

## 2018-03-31 ENCOUNTER — Other Ambulatory Visit: Payer: Self-pay | Admitting: *Deleted

## 2018-04-01 ENCOUNTER — Other Ambulatory Visit: Payer: Self-pay | Admitting: Family Medicine

## 2018-04-10 ENCOUNTER — Encounter: Payer: Self-pay | Admitting: Family Medicine

## 2018-04-10 ENCOUNTER — Ambulatory Visit (INDEPENDENT_AMBULATORY_CARE_PROVIDER_SITE_OTHER): Payer: Medicare Other | Admitting: Family Medicine

## 2018-04-10 VITALS — BP 113/65 | HR 72 | Temp 97.4°F | Ht 62.0 in | Wt 167.6 lb

## 2018-04-10 DIAGNOSIS — M533 Sacrococcygeal disorders, not elsewhere classified: Secondary | ICD-10-CM | POA: Diagnosis not present

## 2018-04-10 MED ORDER — METHYLPREDNISOLONE ACETATE 40 MG/ML IJ SUSP
40.0000 mg | Freq: Once | INTRAMUSCULAR | Status: AC
Start: 2018-04-10 — End: 2018-04-10
  Administered 2018-04-10: 40 mg via INTRAMUSCULAR

## 2018-04-10 NOTE — Patient Instructions (Signed)
Great to see you!  This medication may increase your blood sugar for about 2 weeks.

## 2018-04-10 NOTE — Progress Notes (Signed)
   HPI  Patient presents today here for "hip pain".  Patient explains she has had pain for about 2 days.  She denies any discrete injury but states that she may have bumped her back. She describes left-sided low back pain with no radiation.  She states that it is more sore and less painful.  Patient denies any leg weakness. She has had pain previously like this that responded well to a steroid injection.  She understands that the medication may increase her blood sugar  PMH: Smoking status noted ROS: Per HPI  Objective: BP 113/65   Pulse 72   Temp (!) 97.4 F (36.3 C) (Oral)   Ht 5\' 2"  (1.575 m)   Wt 167 lb 9.6 oz (76 kg)   BMI 30.65 kg/m  Gen: NAD, alert, cooperative with exam HEENT: NCAT CV: RRR, good S1/S2, no murmur Resp: CTABL, no wheezes, non-labored Ext: No edema, warm Neuro: Alert and oriented, No gross deficits  MSK:  TTP over the L sided SI joint Negative Faber BL hips No midline pain   Assessment and plan:  # SI joint pain IM depo medrol- low dose ( 40 mg) given Ice, supportive care No red flags RTC with any concerns   Meds ordered this encounter  Medications  . methylPREDNISolone acetate (DEPO-MEDROL) injection 40 mg    Laroy Apple, MD [facility] 04/10/2018, 11:12 AM

## 2018-04-13 ENCOUNTER — Ambulatory Visit (INDEPENDENT_AMBULATORY_CARE_PROVIDER_SITE_OTHER): Payer: Medicare Other | Admitting: Physician Assistant

## 2018-04-13 ENCOUNTER — Ambulatory Visit (INDEPENDENT_AMBULATORY_CARE_PROVIDER_SITE_OTHER): Payer: Medicare Other

## 2018-04-13 ENCOUNTER — Encounter: Payer: Self-pay | Admitting: Physician Assistant

## 2018-04-13 VITALS — BP 137/74 | HR 69 | Temp 97.7°F | Ht 62.0 in | Wt 167.0 lb

## 2018-04-13 DIAGNOSIS — M47818 Spondylosis without myelopathy or radiculopathy, sacral and sacrococcygeal region: Secondary | ICD-10-CM

## 2018-04-13 DIAGNOSIS — M25552 Pain in left hip: Secondary | ICD-10-CM

## 2018-04-13 DIAGNOSIS — M160 Bilateral primary osteoarthritis of hip: Secondary | ICD-10-CM | POA: Diagnosis not present

## 2018-04-13 DIAGNOSIS — M5126 Other intervertebral disc displacement, lumbar region: Secondary | ICD-10-CM | POA: Diagnosis not present

## 2018-04-13 DIAGNOSIS — M48061 Spinal stenosis, lumbar region without neurogenic claudication: Secondary | ICD-10-CM | POA: Diagnosis not present

## 2018-04-13 MED ORDER — PREDNISONE 10 MG (21) PO TBPK: ORAL_TABLET | ORAL | 0 refills | Status: DC

## 2018-04-13 NOTE — Progress Notes (Signed)
BP 137/74   Pulse 69   Temp 97.7 F (36.5 C) (Oral)   Ht 5\' 2"  (1.575 m)   Wt 167 lb (75.8 kg)   BMI 30.54 kg/m    Subjective:    Patient ID: Kristy [name], female    DOB: [birth year], 84 y.o.   MRN: [id number]  HPI: Kristy [name] is a 84 y.o. female presenting on 04/13/2018 for Hip Pain (left no better. )  Patient comes in for recheck on her left hip and sacroiliac area. She was given steroid but has had no improvement.  She states that she has had long-standing issues with this type of problem.  This episode has only last for some days.  She has tried rested and is not made much of an improvement.  She does have known degenerative joint disease particularly in her knees.  She has an orthopedist and Dr. Wynelle Link.  And we will plan to x-ray her low back and hip.  Past Medical History:  Diagnosis Date  . Anxiety   . Colon polyps   . Complication of anesthesia 01-18-2013   slow to awaken and felt crazy for 3-4 days  . Diabetes mellitus   . Diverticulitis   . H/O hiatal hernia   . Hearing loss 01-07-13   bilateral hearing aids  . History of shingles 01-07-13   3'14 recent outbreak- drying in   . HOH (hard of hearing) 02/05/2018  . Hypercholesteremia   . Hypertension    Dr. Laurance Flatten  . Lumbar spondylosis    osteoarthritis-knees  . Macular degeneration of both eyes    and dry eyes   Relevant past medical, surgical, family and social history reviewed and updated as indicated. Interim medical history since our last visit reviewed. Allergies and medications reviewed and updated. DATA REVIEWED: CHART IN EPIC  Family History reviewed for pertinent findings.  Review of Systems  Constitutional: Negative.   HENT: Negative.   Eyes: Negative.   Respiratory: Negative.   Gastrointestinal: Negative.   Genitourinary: Negative.   Musculoskeletal: Positive for arthralgias, back pain, gait problem and myalgias.    Allergies as of 04/13/2018      Reactions   Vioxx [rofecoxib] Other (See  Comments)   Ulcers in mouth      Medication List        Accurate as of 04/13/18 11:59 PM. Always use your most recent med list.          ADVOCATE LANCETS Misc Check Blood Sugar bid. Dx E11.9   ALPRAZolam 0.25 MG tablet Commonly known as:  XANAX TAKE 1/2 TABLET BY MOUTH TWICE A DAY AND 1 AT BEDTIME   busPIRone 30 MG tablet Commonly known as:  BUSPAR TAKE 1 TABLET (30 MG TOTAL) BY MOUTH 2 (TWO) TIMES DAILY.   cholecalciferol 1000 units tablet Commonly known as:  VITAMIN D Take 1,000 Units by mouth daily.   ezetimibe 10 MG tablet Commonly known as:  ZETIA Take 1 tablet (10 mg total) by mouth daily.   Fish Oil 1000 MG Caps Take 1 capsule by mouth daily.   furosemide 40 MG tablet Commonly known as:  LASIX Take 40 mg by mouth 2 (two) times daily as needed for fluid.   glimepiride 4 MG tablet Commonly known as:  AMARYL TAKE 1 TABLET (4 MG TOTAL) BY MOUTH DAILY WITH BREAKFAST.   glucose blood test strip Commonly known as:  FREESTYLE TEST STRIPS Check Blood sugar two times daily   hydroxypropyl methylcellulose / hypromellose 2.5 %  ophthalmic solution Commonly known as:  ISOPTO TEARS / GONIOVISC Place 1 drop into both eyes 2 (two) times daily as needed (dry eyes).   lisinopril 10 MG tablet Commonly known as:  PRINIVIL,ZESTRIL TAKE 1/2 TABLET DAILY AS DIRECTED   LUMIGAN 0.01 % Soln Generic drug:  bimatoprost   metFORMIN 1000 MG tablet Commonly known as:  GLUCOPHAGE TAKE 1 TABLET BY MOUTH WITH MEALS BREAKFAST AND SUPPER   metoprolol succinate 25 MG 24 hr tablet Commonly known as:  TOPROL-XL TAKE 1 TABLET BY MOUTH EVERY DAY   pravastatin 80 MG tablet Commonly known as:  PRAVACHOL TAKE ONE TABLET BY MOUTH AT BEDTIME   predniSONE 10 MG (21) Tbpk tablet Commonly known as:  STERAPRED UNI-PAK 21 TAB As directed x 6 days   PRESERVISION AREDS 2 PO Take by mouth.          Objective:    BP 137/74   Pulse 69   Temp 97.7 F (36.5 C) (Oral)   Ht 5\' 2"   (1.575 m)   Wt 167 lb (75.8 kg)   BMI 30.54 kg/m   Allergies  Allergen Reactions  . Vioxx [Rofecoxib] Other (See Comments)    Ulcers in mouth     Wt Readings from Last 3 Encounters:  04/13/18 167 lb (75.8 kg)  04/10/18 167 lb 9.6 oz (76 kg)  02/13/18 168 lb (76.2 kg)    Physical Exam  Constitutional: She is oriented to person, place, and time. She appears well-developed and well-nourished.  HENT:  Head: Normocephalic and atraumatic.  Eyes: Pupils are equal, round, and reactive to light. Conjunctivae and EOM are normal.  Cardiovascular: Normal rate, regular rhythm, normal heart sounds and intact distal pulses.  Pulmonary/Chest: Effort normal and breath sounds normal.  Abdominal: Soft. Bowel sounds are normal.  Musculoskeletal:       Lumbar back: She exhibits decreased range of motion, tenderness, pain and spasm.       Back:  Neurological: She is alert and oriented to person, place, and time. She has normal reflexes.  Skin: Skin is warm and dry. No rash noted.  Psychiatric: She has a normal mood and affect. Her behavior is normal. Judgment and thought content normal.        Assessment & Plan:   1. Arthropathy of left sacroiliac joint - DG HIP UNILAT W OR W/O PELVIS 2-3 VIEWS LEFT; Future - DG Lumbar Spine 2-3 Views; Future - predniSONE (STERAPRED UNI-PAK 21 TAB) 10 MG (21) TBPK tablet; As directed x 6 days  Dispense: 21 tablet; Refill: 0  2. Pain of left hip joint - DG HIP UNILAT W OR W/O PELVIS 2-3 VIEWS LEFT; Future - DG Lumbar Spine 2-3 Views; Future   Continue all other maintenance medications as listed above.  Follow up plan: No follow-ups on file.  Educational handout given for [place] PA-C [facility] [address]  [place], [place] [address] [phone]   04/14/2018, 1:40 PM

## 2018-04-20 ENCOUNTER — Telehealth: Payer: Self-pay | Admitting: Physician Assistant

## 2018-04-20 DIAGNOSIS — M47818 Spondylosis without myelopathy or radiculopathy, sacral and sacrococcygeal region: Secondary | ICD-10-CM

## 2018-04-20 DIAGNOSIS — M25552 Pain in left hip: Secondary | ICD-10-CM

## 2018-04-20 NOTE — Addendum Note (Signed)
Addended by: Thana Ates on: 04/20/2018 04:07 PM   Modules accepted: Orders

## 2018-04-20 NOTE — Telephone Encounter (Signed)
  Terald Sleeper, PA-C  [facility] Clinical Pool A 2 hours ago (2:01 PM)    Dos a referral to back specialist or ortho need to happen? [facility] in [place] will see pretty quickly   Kristy [name] with husband and referral placed.

## 2018-04-20 NOTE — Telephone Encounter (Signed)
-----   Message from Memory Argue, PharmD sent at 04/17/2018  9:00 AM EDT ----- Regarding: medication Hi Glenard Haring,  I saw Kristy [name]'s husband [name] this morning and he said she is still in a lot of pain in her hip and back.  Her renal function is OK for gabapentin but I would be worried at her age with risk for dizziness and falls.  He said the prednisone had not helped, which is surprising. Of course, NSAIDs are out with her renal history.  She's taking too much Acetaminophen I told him.  He wanted me to let you know she was still in pain.  Thanks  [employer]

## 2018-04-23 ENCOUNTER — Other Ambulatory Visit: Payer: Self-pay | Admitting: Family Medicine

## 2018-04-30 ENCOUNTER — Encounter: Payer: Self-pay | Admitting: Family

## 2018-04-30 ENCOUNTER — Ambulatory Visit (INDEPENDENT_AMBULATORY_CARE_PROVIDER_SITE_OTHER): Payer: Medicare Other | Admitting: Family

## 2018-04-30 VITALS — BP 116/61 | HR 64 | Temp 97.7°F | Ht 62.0 in

## 2018-04-30 DIAGNOSIS — N3001 Acute cystitis with hematuria: Secondary | ICD-10-CM

## 2018-04-30 DIAGNOSIS — R410 Disorientation, unspecified: Secondary | ICD-10-CM

## 2018-04-30 LAB — URINALYSIS, COMPLETE
Bilirubin, UA: NEGATIVE
Ketones, UA: NEGATIVE
LEUKOCYTES UA: NEGATIVE
NITRITE UA: NEGATIVE
RBC, UA: NEGATIVE
SPEC GRAV UA: 1.02 (ref 1.005–1.030)
Urobilinogen, Ur: 0.2 mg/dL (ref 0.2–1.0)
pH, UA: 5.5 (ref 5.0–7.5)

## 2018-04-30 LAB — MICROSCOPIC EXAMINATION: Renal Epithel, UA: NONE SEEN /hpf

## 2018-04-30 LAB — GLUCOSE HEMOCUE WAIVED: Glu Hemocue Waived: 253 mg/dL — ABNORMAL HIGH (ref 65–99)

## 2018-04-30 MED ORDER — CIPROFLOXACIN HCL 500 MG PO TABS: 500.0000 mg | ORAL_TABLET | Freq: Two times a day (BID) | ORAL | 0 refills | Status: DC

## 2018-04-30 NOTE — Addendum Note (Signed)
Addended by: Evelina Dun A on: 04/30/2018 06:24 PM   Modules accepted: Orders

## 2018-04-30 NOTE — Progress Notes (Signed)
   Subjective:    Patient ID: Kristy [name], female    DOB: [birth year], 84 y.o.   MRN: [id number]  Chief Complaint  Patient presents with  . Altered Mental Status    HPI PT is brought in by husband today for transition altered mental status.Husband states they went out to breakfast the morning and when he got up to pay, she just stopped and wouldn't move or talk and did not know what was going on. Husband reports she has been "fine up til this morning". She is orientated now, but is not still "foggy" and can not remember how they got here.   PT and husband state she has had urinary frequency, but denies any dysuria.   PT had MR of brain 02/10/18 that was negative for acute findings.    Review of Systems  Genitourinary: Positive for frequency.  Psychiatric/Behavioral: Positive for confusion.  All other systems reviewed and are negative.      Objective:   Physical Exam  Constitutional: She appears well-developed and well-nourished. No distress.  HENT:  Head: Normocephalic and atraumatic.  Eyes: Pupils are equal, round, and reactive to light.  Neck: Normal range of motion. Neck supple. No thyromegaly present.  Cardiovascular: Normal rate, regular rhythm, normal heart sounds and intact distal pulses.  No murmur heard. Pulmonary/Chest: Effort normal and breath sounds normal. No respiratory distress. She has no wheezes.  Abdominal: Soft. Bowel sounds are normal. She exhibits no distension. There is no tenderness.  Musculoskeletal: Normal range of motion. She exhibits no edema or tenderness.  Neurological: She is alert. She has normal reflexes. No cranial nerve deficit.  Oriented at this time, but confused at times.   Skin: Skin is warm and dry.  Psychiatric: She has a normal mood and affect. Her behavior is normal. Judgment and thought content normal.  Vitals reviewed.    BP 116/61 (BP Location: Left Arm, Patient Position: Sitting, Cuff Size: Normal)   Pulse 64   Temp 97.7 F  (36.5 C)   Ht 5\' 2"  (1.575 m)   BMI 30.54 kg/m      Assessment & Plan:  Kristy [name] comes in today with chief complaint of Altered Mental Status   Diagnosis and orders addressed:  1. Confusion - Urinalysis, Complete - Glucose Hemocue Waived   Pt unable to leave urine here in office. Husband states "as soon as we get home she will pee". So supplies given to patient to bring back. I believe this is related to infection/UTI. If negative we will do neurologists referral. Had negative MR brain just a few months ago.    Kristy Dun, FNP

## 2018-04-30 NOTE — Patient Instructions (Signed)

## 2018-05-09 ENCOUNTER — Other Ambulatory Visit: Payer: Self-pay | Admitting: Family Medicine

## 2018-05-18 ENCOUNTER — Other Ambulatory Visit: Payer: Self-pay | Admitting: *Deleted

## 2018-05-18 ENCOUNTER — Telehealth: Payer: Self-pay | Admitting: Family Medicine

## 2018-05-18 DIAGNOSIS — M549 Dorsalgia, unspecified: Secondary | ICD-10-CM

## 2018-05-18 DIAGNOSIS — R300 Dysuria: Secondary | ICD-10-CM

## 2018-05-18 NOTE — Telephone Encounter (Signed)
Husband aware - order placed for UA and culture

## 2018-05-19 ENCOUNTER — Other Ambulatory Visit: Payer: Self-pay | Admitting: Family Medicine

## 2018-05-19 ENCOUNTER — Other Ambulatory Visit: Payer: Medicare Other

## 2018-05-19 DIAGNOSIS — M549 Dorsalgia, unspecified: Secondary | ICD-10-CM | POA: Diagnosis not present

## 2018-05-19 DIAGNOSIS — R300 Dysuria: Secondary | ICD-10-CM

## 2018-05-19 LAB — URINALYSIS, COMPLETE
BILIRUBIN UA: NEGATIVE
Ketones, UA: NEGATIVE
LEUKOCYTES UA: NEGATIVE
Nitrite, UA: NEGATIVE
PH UA: 6 (ref 5.0–7.5)
PROTEIN UA: NEGATIVE
RBC UA: NEGATIVE
Specific Gravity, UA: 1.015 (ref 1.005–1.030)
Urobilinogen, Ur: 0.2 mg/dL (ref 0.2–1.0)

## 2018-05-19 LAB — MICROSCOPIC EXAMINATION
BACTERIA UA: NONE SEEN
RBC, UA: NONE SEEN /hpf (ref 0–2)
RENAL EPITHEL UA: NONE SEEN /HPF

## 2018-05-20 ENCOUNTER — Telehealth: Payer: Self-pay | Admitting: Family Medicine

## 2018-05-20 ENCOUNTER — Telehealth: Payer: Self-pay

## 2018-05-20 LAB — URINE CULTURE

## 2018-05-20 NOTE — Telephone Encounter (Signed)
Husband called inquiring about urine that was done yesterday. Wants to know if meds can be prescribed. Please advise

## 2018-05-20 NOTE — Telephone Encounter (Signed)
Patient's husband notified  

## 2018-05-20 NOTE — Telephone Encounter (Signed)
Spoke with husband and sent back question to [facility] if patient will be treated

## 2018-05-20 NOTE — Telephone Encounter (Signed)
I have already reviewed the urinalysis and based on those results no antibiotic treatment was necessary.  A urine culture and sensitivity is still pending.  We will make a decision at that time whether antibiotics are necessary in which antibiotic to use if antibiotics are needed at all.

## 2018-05-21 ENCOUNTER — Telehealth: Payer: Self-pay | Admitting: *Deleted

## 2018-05-21 NOTE — Telephone Encounter (Signed)
Called with negative urine culture results. Spoke with husband, Kristy [name]. [name] is still complaining of pain and burning. The pain is pretty constant and not just when she urinates. Questioned if it was vaginal burning and she stated that it is. Suggested that it may be a yeast infection and her husband said she was beginning to suspect that. No odor or obvious discharge.  Recommended that she gently clean from front to back with a clean washcloth and plain water. NO SOAP. Do not use any powders, lotions, or perfumes. Pat the area dry. Apply generic monistat external cream to inside of labia and any irritated areas. Use 3 day ovule. Insert one at bedtime for 3 nights. Wear cotton underwear.   If symptoms worsen or do not improve she needs to be seen.  Husband voiced understanding and agreement to plan.

## 2018-05-25 ENCOUNTER — Telehealth: Payer: Self-pay | Admitting: Family Medicine

## 2018-05-25 NOTE — Telephone Encounter (Signed)
appt

## 2018-05-26 ENCOUNTER — Ambulatory Visit (INDEPENDENT_AMBULATORY_CARE_PROVIDER_SITE_OTHER): Payer: Medicare Other | Admitting: Family Medicine

## 2018-05-26 ENCOUNTER — Encounter: Payer: Self-pay | Admitting: Family Medicine

## 2018-05-26 VITALS — BP 140/70 | HR 76 | Temp 97.9°F | Ht 62.0 in | Wt 167.0 lb

## 2018-05-26 DIAGNOSIS — R300 Dysuria: Secondary | ICD-10-CM | POA: Diagnosis not present

## 2018-05-26 DIAGNOSIS — M25559 Pain in unspecified hip: Secondary | ICD-10-CM

## 2018-05-26 LAB — MICROSCOPIC EXAMINATION
RBC MICROSCOPIC, UA: NONE SEEN /HPF (ref 0–2)
Renal Epithel, UA: NONE SEEN /hpf

## 2018-05-26 LAB — URINALYSIS, COMPLETE
Bilirubin, UA: NEGATIVE
Leukocytes, UA: NEGATIVE
NITRITE UA: NEGATIVE
PH UA: 5 (ref 5.0–7.5)
RBC, UA: NEGATIVE
Specific Gravity, UA: >1.03 — ABNORMAL HIGH (ref 1.005–1.030)
Urobilinogen, Ur: 0.2 mg/dL (ref 0.2–1.0)

## 2018-05-26 MED ORDER — FLUCONAZOLE 150 MG PO TABS: ORAL_TABLET | ORAL | 0 refills | Status: DC

## 2018-05-26 MED ORDER — CEFDINIR 300 MG PO CAPS: 300.0000 mg | ORAL_CAPSULE | Freq: Two times a day (BID) | ORAL | 0 refills | Status: DC

## 2018-05-26 NOTE — Patient Instructions (Addendum)
Start the antibiotic today, Omnicef 300 twice daily for 7 days Take the Diflucan 150 1 p.o. today and then 1 daily for 2 days after completing the antibiotic. Marland KitchenStop drinking Cokes and drink more water Drink more water!!!!! Water prevent urinary tract infections Water helps rehydrate the disc between the vertebrae of the back and can help relieve back pain. Use warm wet compresses to the back 20 minutes 3 or 4 times daily. Call in 4 weeks if back pain is no better. Take Tylenol for pain. The less sugar

## 2018-05-26 NOTE — Progress Notes (Signed)
See telephone note.
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
## 2018-05-27 LAB — URINE CULTURE

## 2018-05-31 ENCOUNTER — Other Ambulatory Visit: Payer: Self-pay | Admitting: Family Medicine

## 2018-06-05 ENCOUNTER — Telehealth: Payer: Self-pay | Admitting: Neurology

## 2018-06-05 ENCOUNTER — Encounter: Payer: Self-pay | Admitting: Family Medicine

## 2018-06-05 ENCOUNTER — Ambulatory Visit (INDEPENDENT_AMBULATORY_CARE_PROVIDER_SITE_OTHER): Payer: Medicare Other | Admitting: Family Medicine

## 2018-06-05 VITALS — BP 129/70 | HR 66 | Temp 97.6°F | Ht 62.0 in | Wt 167.0 lb

## 2018-06-05 DIAGNOSIS — N183 Chronic kidney disease, stage 3 unspecified: Secondary | ICD-10-CM

## 2018-06-05 DIAGNOSIS — E1122 Type 2 diabetes mellitus with diabetic chronic kidney disease: Secondary | ICD-10-CM | POA: Diagnosis not present

## 2018-06-05 DIAGNOSIS — G459 Transient cerebral ischemic attack, unspecified: Secondary | ICD-10-CM

## 2018-06-05 DIAGNOSIS — C9190 Lymphoid leukemia, unspecified not having achieved remission: Secondary | ICD-10-CM | POA: Diagnosis not present

## 2018-06-05 DIAGNOSIS — C9110 Chronic lymphocytic leukemia of B-cell type not having achieved remission: Secondary | ICD-10-CM

## 2018-06-05 DIAGNOSIS — R404 Transient alteration of awareness: Secondary | ICD-10-CM

## 2018-06-05 LAB — URINALYSIS, COMPLETE
BILIRUBIN UA: NEGATIVE
LEUKOCYTES UA: NEGATIVE
Nitrite, UA: NEGATIVE
RBC UA: NEGATIVE
Urobilinogen, Ur: 0.2 mg/dL (ref 0.2–1.0)
pH, UA: 5.5 (ref 5.0–7.5)

## 2018-06-05 LAB — MICROSCOPIC EXAMINATION
BACTERIA UA: NONE SEEN
Renal Epithel, UA: NONE SEEN /hpf

## 2018-06-05 NOTE — Telephone Encounter (Signed)
I called husband, [name] (on [place]) and scheduled work in appt for 06/10/18 at 44am, check in [place]. He verbalized understanding and agreeable to date/time.

## 2018-06-05 NOTE — Patient Instructions (Signed)
We will discuss these events with the neurologist that you have seen in the past and we will decide which direction we should move in from today before you leave the office. In the future your husband will try to check more blood sugars more regularly especially before eating Continue to drink plenty of fluids and stay well-hydrated

## 2018-06-05 NOTE — Progress Notes (Signed)
Subjective:    Patient ID: Kristy [name], female    DOB: 07/16/1928, 84 y.o.   MRN: [id number]  HPI Patient here today for an episode this morning where she was unable to respond or talk.  The patient was at home this morning and following breakfast had an episode of not responding.  This is happened previously on 04/30/2018.  She was seen by [facility] in May and had an MRI of the brain with no acute findings and moderate generalized atrophy and mild small vessel subcortical ischemic disease.  She also had carotid Dopplers which showed 1 to 39% stenosis bilaterally.  She has a follow-up appointment sometime very soon with a neurologist as he saw her in early May.  We will most likely discuss the events of the day today with him.  Patient is pleasant and smiling.  According to her husband they were eating at a restaurant and the patient had a sort of staring episode looking off into the distance and he said this is similar to what it was previously about 3 weeks ago when this happened.  She was treating for urinary tract infection at that time.  She is still taking antibiotics.  This lasted 5 to 10 minutes until he drove her to the office and then she asked why she was here when she got to the office.  She was somewhat resistant and responding to him as far as moving her extremities but was able to move all of her extremities and get into the car with assistance.  Today she appears as she did the last time I saw her with diminished memory but moving all extremities.  Her records from the neurologist and her MRI report were reviewed during the visit with the patient and her husband.  We will get another urine today and a CBC and BMP.  There was no blood sugar checked at home this morning.  The patient during the exam responded appropriately to me although she has decreased vision and somewhat decreased hearing.  She has no other complaints chest pain shortness of breath nausea vomiting  urinary tract symptoms.   We spoke with DR Jannifer Franklin, neurology today - he will arrange a EEG for the pt and will call for an appt next week.  Patient Active Problem List   Diagnosis Date Noted  . HOH (hard of hearing) 02/05/2018  . Incisional hernia, without obstruction or gangrene 05/27/2017  . Type 2 diabetes mellitus with stage 3 chronic kidney disease ([place]) 07/07/2015  . Type 2 diabetes mellitus with chronic kidney disease and hypertension ([place]) 07/07/2015  . SVT (supraventricular tachycardia) ([place]) 02/08/2015  . Depression 08/24/2013  . Essential hypertension, benign 06/08/2013  . Hyperlipidemia associated with type 2 diabetes mellitus ([place]) 06/08/2013  . Vitamin D deficiency 06/08/2013  . Diabetes mellitus type 2 controlled 06/08/2013  . OA (osteoarthritis) of knee 01/18/2013  . CLL (chronic lymphocytic leukemia) ([place]) 06/17/2012   Outpatient Encounter Medications as of 06/05/2018  Medication Sig  . ADVOCATE LANCETS MISC Check Blood Sugar bid. Dx E11.9  . ALPRAZolam (XANAX) 0.25 MG tablet TAKE 1/2 TABLET BY MOUTH TWICE A DAY AND 1 AT BEDTIME  . busPIRone (BUSPAR) 30 MG tablet TAKE 1 TABLET (30 MG TOTAL) BY MOUTH 2 (TWO) TIMES DAILY.  . cefdinir (OMNICEF) 300 MG capsule Take 1 capsule (300 mg total) by mouth 2 (two) times daily. 1 po BID  . cholecalciferol (VITAMIN D) 1000 UNITS tablet Take 1,000 Units by mouth daily.  Marland Kitchen  ciprofloxacin (CIPRO) 500 MG tablet Take 1 tablet (500 mg total) by mouth 2 (two) times daily.  Marland Kitchen ezetimibe (ZETIA) 10 MG tablet TAKE 1 TABLET BY MOUTH EVERY DAY  . fluconazole (DIFLUCAN) 150 MG tablet Take 1 tab today, then after 1 week take 1 daily for 2 days.  . furosemide (LASIX) 40 MG tablet Take 40 mg by mouth 2 (two) times daily as needed for fluid.   Marland Kitchen glimepiride (AMARYL) 4 MG tablet TAKE 1 TABLET (4 MG TOTAL) BY MOUTH DAILY WITH BREAKFAST.  Marland Kitchen glucose blood (FREESTYLE TEST STRIPS) test strip Check Blood sugar two times daily  . hydroxypropyl methylcellulose  (ISOPTO TEARS) 2.5 % ophthalmic solution Place 1 drop into both eyes 2 (two) times daily as needed (dry eyes).  Marland Kitchen lisinopril (PRINIVIL,ZESTRIL) 10 MG tablet TAKE 1/2 TABLET DAILY AS DIRECTED  . LUMIGAN 0.01 % SOLN   . metFORMIN (GLUCOPHAGE) 1000 MG tablet TAKE 1 TABLET BY MOUTH WITH MEALS BREAKFAST AND SUPPER  . metoprolol succinate (TOPROL-XL) 25 MG 24 hr tablet TAKE 1 TABLET BY MOUTH EVERY DAY  . Multiple Vitamins-Minerals (PRESERVISION AREDS 2 PO) Take by mouth.  . Omega-3 Fatty Acids (FISH OIL) 1000 MG CAPS Take 1 capsule by mouth daily.  . pravastatin (PRAVACHOL) 80 MG tablet TAKE ONE TABLET BY MOUTH AT BEDTIME   No facility-administered encounter medications on file as of 06/05/2018.       Review of Systems  Constitutional: Negative.   HENT: Negative.   Eyes: Negative.   Respiratory: Negative.   Cardiovascular: Negative.   Gastrointestinal: Negative.   Endocrine: Negative.   Genitourinary: Negative.   Musculoskeletal: Negative.   Skin: Negative.   Allergic/Immunologic: Negative.   Neurological: Negative.   Hematological: Negative.   Psychiatric/Behavioral: Negative.        Objective:   Physical Exam  Constitutional: She is oriented to person, place, and time. She appears well-developed and well-nourished. No distress.  Patient is alert and responding appropriately to questions asked of her.  She does not remember this "spell" that happened this morning.  HENT:  Head: Normocephalic and atraumatic.  Eyes: Pupils are equal, round, and reactive to light. Conjunctivae and EOM are normal. Right eye exhibits no discharge. Left eye exhibits no discharge. No scleral icterus.  Neck: Normal range of motion. Neck supple. No thyromegaly present.  Cardiovascular: Normal rate, regular rhythm and normal heart sounds.  No murmur heard. Pulmonary/Chest: Effort normal and breath sounds normal. She has no wheezes. She has no rales.  Clear anteriorly and posteriorly  Abdominal: Soft. Bowel  sounds are normal. She exhibits no mass. There is no tenderness. There is no rebound and no guarding.  No abnormal abdominal findings on exam  Musculoskeletal: She exhibits no edema or tenderness.  Range of motion appears normal with patient in wheelchair.  Lymphadenopathy:    She has no cervical adenopathy.  Neurological: She is alert and oriented to person, place, and time. She has normal reflexes. No cranial nerve deficit.  Patient does not remember episode from this morning.  Skin: Skin is warm and dry. No pallor.  Psychiatric: She has a normal mood and affect. Her behavior is normal. Judgment and thought content normal.  The patient's mood affect and behavior are normal for her and she does not see well and this is an ongoing problem.  Nursing note and vitals reviewed.  BP 129/70 (BP Location: Left Arm, Patient Position: Sitting, Cuff Size: Normal)   Pulse 66   Temp 97.6 F (36.4 C) (  Oral)   Ht 5\' 2"  (1.575 m)   Wt 167 lb (75.8 kg)   BMI 30.54 kg/m         Assessment & Plan:  1. CLL (chronic lymphocytic leukemia) ([place]) -This is an ongoing problem and has been with the patient for years.  2. Type 2 diabetes mellitus with stage 3 chronic kidney disease, without long-term current use of insulin ([facility]) -We will check a BMP today.  3. TIA (transient ischemic attack) -This is a possible diagnosis for today's episode and previous episode which occurred about 3 weeks ago when 1 of our mid-level saw her in the practice.  We will discuss with the neurologist the direction that we should move forward with in light of the fact that she had an MRI of the brain back in May.  She is moving all extremities now and was able to walk with assistance to her car following this episode today.  Patient Instructions  We will discuss these events with the neurologist that you have seen in the past and we will decide which direction we should move in from today before you leave the office. In the  future your husband will try to check more blood sugars more regularly especially before eating Continue to drink plenty of fluids and stay well-hydrated  Arrie Senate MD

## 2018-06-05 NOTE — Telephone Encounter (Signed)
Dr. Laurance Flatten contact me about this patient.  Patient has had 2 events over the last 3 weeks, the first 1 to 3 weeks ago associated with urinary tract infection where the patient had an event of staring off lasting about 10 to 15 minutes, not responding well.  A similar event occurred today.  The patient needs an evaluation for possible seizures.  I will get an EEG study and a revisit, we may need to consider a repeat MRI of the brain.

## 2018-06-06 ENCOUNTER — Other Ambulatory Visit: Payer: Self-pay | Admitting: Family Medicine

## 2018-06-06 LAB — CBC WITH DIFFERENTIAL/PLATELET
Basophils Absolute: 0.1 10*3/uL (ref 0.0–0.2)
Basos: 0 %
EOS (ABSOLUTE): 0.1 10*3/uL (ref 0.0–0.4)
Eos: 1 %
Hematocrit: 37.4 % (ref 34.0–46.6)
Hemoglobin: 12.3 g/dL (ref 11.1–15.9)
IMMATURE GRANS (ABS): 0 10*3/uL (ref 0.0–0.1)
Immature Granulocytes: 0 %
LYMPHS ABS: 8.2 10*3/uL — AB (ref 0.7–3.1)
LYMPHS: 55 %
MCH: 30.5 pg (ref 26.6–33.0)
MCHC: 32.9 g/dL (ref 31.5–35.7)
MCV: 93 fL (ref 79–97)
Monocytes Absolute: 0.8 10*3/uL (ref 0.1–0.9)
Monocytes: 5 %
NEUTROS ABS: 5.9 10*3/uL (ref 1.4–7.0)
Neutrophils: 39 %
PLATELETS: 154 10*3/uL (ref 150–450)
RBC: 4.03 x10E6/uL (ref 3.77–5.28)
RDW: 13.2 % (ref 12.3–15.4)
WBC: 15.1 10*3/uL — ABNORMAL HIGH (ref 3.4–10.8)

## 2018-06-06 LAB — BMP8+EGFR
BUN/Creatinine Ratio: 10 — ABNORMAL LOW (ref 12–28)
BUN: 10 mg/dL (ref 8–27)
CO2: 19 mmol/L — ABNORMAL LOW (ref 20–29)
CREATININE: 0.96 mg/dL (ref 0.57–1.00)
Calcium: 9.2 mg/dL (ref 8.7–10.3)
Chloride: 107 mmol/L — ABNORMAL HIGH (ref 96–106)
GFR calc Af Amer: 61 mL/min/{1.73_m2} (ref >59)
GFR calc non Af Amer: 53 mL/min/{1.73_m2} — ABNORMAL LOW (ref >59)
GLUCOSE: 258 mg/dL — AB (ref 65–99)
Potassium: 4.6 mmol/L (ref 3.5–5.2)
SODIUM: 144 mmol/L (ref 134–144)

## 2018-06-07 LAB — URINE CULTURE

## 2018-06-09 ENCOUNTER — Other Ambulatory Visit: Payer: Self-pay | Admitting: Family Medicine

## 2018-06-09 ENCOUNTER — Other Ambulatory Visit: Payer: Self-pay | Admitting: *Deleted

## 2018-06-10 ENCOUNTER — Encounter: Payer: Self-pay | Admitting: Neurology

## 2018-06-10 ENCOUNTER — Ambulatory Visit: Payer: Medicare Other | Admitting: Neurology

## 2018-06-10 ENCOUNTER — Telehealth: Payer: Self-pay | Admitting: Neurology

## 2018-06-10 DIAGNOSIS — R404 Transient alteration of awareness: Secondary | ICD-10-CM | POA: Diagnosis not present
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
## 2018-06-10 NOTE — Telephone Encounter (Signed)
[employer] Medicare order sent to GI. No auth they will reach out to the pt to schedule.  °

## 2018-06-11 ENCOUNTER — Telehealth: Payer: Self-pay | Admitting: Neurology

## 2018-06-11 ENCOUNTER — Ambulatory Visit: Payer: Medicare Other | Admitting: Neurology

## 2018-06-11 DIAGNOSIS — R404 Transient alteration of awareness: Secondary | ICD-10-CM

## 2018-06-11 DIAGNOSIS — R4182 Altered mental status, unspecified: Secondary | ICD-10-CM

## 2018-06-11 NOTE — Procedures (Signed)
    History:  Kristy [name] is an 84 year old patient with a history of diabetes.  The patient has had 2 recent episodes of staring off, with unresponsiveness lasting 5 to 10 minutes.  The patient has no recollection of the events when they do occur.  The patient is being evaluated for possible seizures.  This is a routine EEG.  No skull defects are noted.  Medications include alprazolam, aspirin, BuSpar, vitamin D supplementation, Zetia, Lasix, Amaryl, Prinivil, metformin, Toprol, multivitamins, PreserVision, fish oil, and Pravachol.  EEG classification: Normal awake  Description of the recording: The background rhythms of this recording consists of a fairly well modulated medium amplitude alpha rhythm of 10 Hz that is reactive to eye opening and closure. As the record progresses, the patient appears to remain in the waking state throughout the recording. Photic stimulation was performed, resulting in a bilateral and symmetric photic driving response. Hyperventilation was not performed. At no time during the recording does there appear to be evidence of spike or spike wave discharges or evidence of focal slowing. EKG monitor shows no evidence of cardiac rhythm abnormalities with a heart rate of 66.  Impression: This is a normal EEG recording in the waking state. No evidence of ictal or interictal discharges are seen.

## 2018-06-11 NOTE — Telephone Encounter (Signed)
I called the patient regarding the EEG study, the study was normal.  This does not exclude the possibility of seizures, if the patient has another event of staring off, I will go ahead and start anticonvulsant therapy on an empiric basis.  The family is to contact me.

## 2018-06-13 ENCOUNTER — Ambulatory Visit
Admission: RE | Admit: 2018-06-13 | Discharge: 2018-06-13 | Disposition: A | Discharge status: Home / Self Care | Payer: Medicare Other | Source: Ambulatory Visit | Attending: Neurology | Admitting: Neurology

## 2018-06-13 DIAGNOSIS — R404 Transient alteration of awareness: Secondary | ICD-10-CM | POA: Diagnosis not present

## 2018-06-14 ENCOUNTER — Telehealth: Payer: Self-pay | Admitting: Neurology

## 2018-06-14 NOTE — Telephone Encounter (Signed)
I called the patient.  The MRI of the brain is unchanged from May 2019.  If the patient has another episode of staring off, we will consider an empiric trial on anticonvulsant medications.    MRI brain 06/14/18:  IMPRESSION: This MRI of the brain without contrast shows the following: 1.    Stable moderate generalized cortical atrophy that is most pronounced in the mesial temporal lobes and the peri-insular region. 2.    Mild chronic microvascular ischemic changes, unchanged compared to the 02/10/2018 MRI. 3.    There are no acute findings.

## 2018-06-18 ENCOUNTER — Encounter: Payer: Self-pay | Admitting: Family Medicine

## 2018-06-18 ENCOUNTER — Ambulatory Visit (INDEPENDENT_AMBULATORY_CARE_PROVIDER_SITE_OTHER): Payer: Medicare Other

## 2018-06-18 ENCOUNTER — Ambulatory Visit (INDEPENDENT_AMBULATORY_CARE_PROVIDER_SITE_OTHER): Payer: Medicare Other | Admitting: Family Medicine

## 2018-06-18 VITALS — BP 141/62 | HR 67 | Temp 97.3°F | Ht 63.0 in | Wt 173.0 lb

## 2018-06-18 DIAGNOSIS — E559 Vitamin D deficiency, unspecified: Secondary | ICD-10-CM

## 2018-06-18 DIAGNOSIS — I10 Essential (primary) hypertension: Secondary | ICD-10-CM

## 2018-06-18 DIAGNOSIS — C9190 Lymphoid leukemia, unspecified not having achieved remission: Secondary | ICD-10-CM

## 2018-06-18 DIAGNOSIS — E1169 Type 2 diabetes mellitus with other specified complication: Secondary | ICD-10-CM | POA: Diagnosis not present

## 2018-06-18 DIAGNOSIS — N183 Chronic kidney disease, stage 3 unspecified: Secondary | ICD-10-CM

## 2018-06-18 DIAGNOSIS — E785 Hyperlipidemia, unspecified: Secondary | ICD-10-CM

## 2018-06-18 DIAGNOSIS — E1122 Type 2 diabetes mellitus with diabetic chronic kidney disease: Secondary | ICD-10-CM

## 2018-06-18 DIAGNOSIS — E0590 Thyrotoxicosis, unspecified without thyrotoxic crisis or storm: Secondary | ICD-10-CM | POA: Diagnosis not present

## 2018-06-18 DIAGNOSIS — C9110 Chronic lymphocytic leukemia of B-cell type not having achieved remission: Secondary | ICD-10-CM

## 2018-06-18 DIAGNOSIS — H3530 Unspecified macular degeneration: Secondary | ICD-10-CM

## 2018-06-18 DIAGNOSIS — K439 Ventral hernia without obstruction or gangrene: Secondary | ICD-10-CM

## 2018-06-18 LAB — BAYER DCA HB A1C WAIVED: HB A1C (BAYER DCA - WAIVED): 7.6 % — ABNORMAL HIGH

## 2018-06-18 NOTE — Progress Notes (Signed)
Subjective:    Patient ID: Kristy [name], female    DOB: 28-Nov-1927, 84 y.o.   MRN: [id number]  HPI Pt here for follow up and management of chronic medical problems which includes diabetes, hypertension and hyperthyroid. She is taking medication regularly.  She today has no complaints.  Because of spells witnessed by her husband she has seen the neurologist and these studies and office visit were reviewed by me before going into the room today.  The results were discussed with the husband and they have already been discussed with the husband by the neurologist.  The MRI basically showed brain atrophy with no mass lesions.  The EEG was normal.  The neurologist is currently thinking that some of these Spells Could Be Seizure Activity Even Though the EEG Was Normal and Is Considered Putting Her on an Anticonvulsant If She Has Any More the Spells and the Patient Has Been Instructed to Call Him and Let Him Be Aware of This.  The patient today denies any chest pain or shortness of breath.  She does have occasional abdominal pain which seems to be associated with a 2 ventral hernias that she has.  We did tell the patient and her husband if she develops any severe pain that she should call or get to the emergency room immediately.  She denies any nausea or vomiting blood in the stool or black tarry bowel movements and is passing her water without problems.    Patient Active Problem List   Diagnosis Date Noted  . HOH (hard of hearing) 02/05/2018  . Incisional hernia, without obstruction or gangrene 05/27/2017  . Type 2 diabetes mellitus with stage 3 chronic kidney disease ([place]) 07/07/2015  . Type 2 diabetes mellitus with chronic kidney disease and hypertension ([place]) 07/07/2015  . SVT (supraventricular tachycardia) ([place]) 02/08/2015  . Depression 08/24/2013  . Essential hypertension, benign 06/08/2013  . Hyperlipidemia associated with type 2 diabetes mellitus ([place]) 06/08/2013  . Vitamin D deficiency  06/08/2013  . Diabetes mellitus type 2 controlled 06/08/2013  . OA (osteoarthritis) of knee 01/18/2013  . CLL (chronic lymphocytic leukemia) ([place]) 06/17/2012   Outpatient Encounter Medications as of 06/18/2018  Medication Sig  . ADVOCATE LANCETS MISC Check Blood Sugar bid. Dx E11.9  . ALPRAZolam (XANAX) 0.25 MG tablet TAKE 1/2 TABLET BY MOUTH TWICE A DAY AND 1 AT BEDTIME  . aspirin EC 81 MG tablet Take 81 mg by mouth daily.  . busPIRone (BUSPAR) 30 MG tablet TAKE 1 TABLET (30 MG TOTAL) BY MOUTH 2 (TWO) TIMES DAILY.  . cholecalciferol (VITAMIN D) 1000 UNITS tablet Take 1,000 Units by mouth daily.  Marland Kitchen ezetimibe (ZETIA) 10 MG tablet TAKE 1 TABLET BY MOUTH EVERY DAY  . furosemide (LASIX) 40 MG tablet Take 40 mg by mouth 2 (two) times daily as needed for fluid.   Marland Kitchen glimepiride (AMARYL) 4 MG tablet TAKE 1 TABLET (4 MG TOTAL) BY MOUTH DAILY WITH BREAKFAST.  Marland Kitchen glucose blood (FREESTYLE TEST STRIPS) test strip Check Blood sugar two times daily  . hydroxypropyl methylcellulose (ISOPTO TEARS) 2.5 % ophthalmic solution Place 1 drop into both eyes 2 (two) times daily as needed (dry eyes).  Marland Kitchen lisinopril (PRINIVIL,ZESTRIL) 10 MG tablet TAKE 1/2 TABLET DAILY AS DIRECTED  . LUMIGAN 0.01 % SOLN   . metFORMIN (GLUCOPHAGE) 1000 MG tablet TAKE 1 TABLET BY MOUTH WITH MEALS BREAKFAST AND SUPPER  . metoprolol succinate (TOPROL-XL) 25 MG 24 hr tablet TAKE 1 TABLET BY MOUTH EVERY DAY  . Multiple  Vitamins-Minerals (PRESERVISION AREDS 2 PO) Take by mouth.  . Omega-3 Fatty Acids (FISH OIL) 1000 MG CAPS Take 1 capsule by mouth daily.  . pravastatin (PRAVACHOL) 80 MG tablet TAKE ONE TABLET BY MOUTH AT BEDTIME  . [DISCONTINUED] cefdinir (OMNICEF) 300 MG capsule Take 1 capsule (300 mg total) by mouth 2 (two) times daily. 1 po BID   No facility-administered encounter medications on file as of 06/18/2018.      Review of Systems  Constitutional: Negative.   HENT: Negative.   Eyes: Negative.   Respiratory: Negative.     Cardiovascular: Negative.   Gastrointestinal: Negative.   Endocrine: Negative.   Genitourinary: Negative.   Musculoskeletal: Negative.   Skin: Negative.   Allergic/Immunologic: Negative.   Neurological: Negative.   Hematological: Negative.   Psychiatric/Behavioral: Negative.        Objective:   Physical Exam  Constitutional: She is oriented to person, place, and time. She appears well-developed and well-nourished. No distress.  The patient looks great is smiling and pleasant.  She cannot see.  HENT:  Head: Normocephalic and atraumatic.  Right Ear: External ear normal.  Left Ear: External ear normal.  Nose: Nose normal.  Mouth/Throat: Oropharynx is clear and moist. No oropharyngeal exudate.  Eyes: Pupils are equal, round, and reactive to light. Conjunctivae and EOM are normal. Right eye exhibits no discharge. Left eye exhibits no discharge. No scleral icterus.  Diminished vision secondary to macular degeneration.  Neck: Normal range of motion. Neck supple. No thyromegaly present.  No bruits thyromegaly or anterior cervical adenopathy  Cardiovascular: Normal rate, regular rhythm, normal heart sounds and intact distal pulses.  No murmur heard. Heart is regular at 84/min  Pulmonary/Chest: Effort normal and breath sounds normal. She has no wheezes. She has no rales.  Clear anteriorly and posteriorly  Abdominal: Soft. Bowel sounds are normal. She exhibits no mass. There is tenderness.  The abdomen is obese without liver or spleen enlargement masses organ enlargement or bruits.  She does have 1 incisional scar and there is a ventral hernia above this and another hernia in the umbilicus.  These are uncomfortable but has not had any severe pain with these.  Musculoskeletal: She exhibits tenderness. She exhibits no edema.  The patient has ongoing back pain and arthritis and has trouble getting on the table and off the table especially when laying down supine.  Lymphadenopathy:    She has  no cervical adenopathy.  Neurological: She is alert and oriented to person, place, and time. She has normal reflexes. No cranial nerve deficit.  Skin: Skin is warm and dry. No rash noted.  Psychiatric: She has a normal mood and affect. Her behavior is normal. Judgment and thought content normal.  The patient's mood affect and behavior are all normal for her.  Nursing note and vitals reviewed.  BP (!) 141/62 (BP Location: Left Arm)   Pulse 67   Temp (!) 97.3 F (36.3 C) (Oral)   Ht '5\' 3"'  (1.6 m)   Wt 173 lb (78.5 kg)   BMI 30.65 kg/m         Assessment & Plan:  1. Type 2 diabetes mellitus with stage 3 chronic kidney disease, without long-term current use of insulin (HCC) -Continue current treatment watching diet as closely as possible pending results of lab work - CBC with Differential/Platelet - Lipid panel - Bayer DCA Hb A1c Waived  2. CLL (chronic lymphocytic leukemia) ([place]) -This is been an ongoing problem for many years.  She has seen  the hematologist in the past and he recommended no change in treatment just monitor unless the white count gets very high. - CBC with Differential/Platelet  3. Vitamin D deficiency -Continue current treatment pending results of lab work - CBC with Differential/Platelet - VITAMIN D 25 Hydroxy (Vit-D Deficiency, Fractures)  4. Essential hypertension, benign -Systolic blood pressure is slightly elevated today but no change in treatment. - BMP8+EGFR - CBC with Differential/Platelet - Lipid panel - Hepatic function panel - DG Chest 2 View; Future  5. Hyperthyroidism -Continue current treatment pending results of lab work - CBC with Differential/Platelet - Thyroid Panel With TSH  6. Hyperlipidemia associated with type 2 diabetes mellitus ([place]) -Continue with pravastatin and as aggressive therapeutic lifestyle changes as possible - DG Chest 2 View; Future  7. Macular degeneration of both eyes, unspecified type -The patient is pretty  debilitated because of lack of vision from both eyes.  8. Ventral hernia without obstruction or gangrene -Continue to monitor this carefully and if any severe pain patient should be taken to the emergency room immediately  Patient Instructions                       Medicare Annual Wellness Visit  [facility] and the medical providers at [facility] strive to bring you the best medical care.  In doing so we not only want to address your current medical conditions and concerns but also to detect new conditions early and prevent illness, disease and health-related problems.    Medicare offers a yearly Wellness Visit which allows our clinical staff to assess your need for preventative services including immunizations, lifestyle education, counseling to decrease risk of preventable diseases and screening for fall risk and other medical concerns.    This visit is provided free of charge (no copay) for all Medicare recipients. The clinical pharmacists at [facility] have begun to conduct these Wellness Visits which will also include a thorough review of all your medications.    As you primary medical provider recommend that you make an appointment for your Annual Wellness Visit if you have not done so already this year.  You may set up this appointment before you leave today or you may call back ([phone]) and schedule an appointment.  Please make sure when you call that you mention that you are scheduling your Annual Wellness Visit with the clinical pharmacist so that the appointment may be made for the proper length of time.     Continue current medications. Continue good therapeutic lifestyle changes which include good diet and exercise. Fall precautions discussed with patient. If an FOBT was given today- please return it to our front desk. If you are over 11 years old - you may need Prevnar 88 or the adult Pneumonia vaccine.  **Flu shots are  available--- please call and schedule a FLU-CLINIC appointment**  After your visit with [place] today you will receive a survey in the mail or online from [employer] regarding your care with [place]. Please take a moment to fill this out. Your feedback is very important to [place] as you can help [place] better understand your patient needs as well as improve your experience and satisfaction. WE CARE ABOUT YOU!!!   Continue to check blood sugars regularly Make sure the patient drinks plenty of water and stays well-hydrated Follow-up with neurology as planned and especially if she has any more spells like we have observed recently Be sure to get the  flu shot toward the middle or end of October.  Arrie Senate MD

## 2018-06-18 NOTE — Patient Instructions (Addendum)
Medicare Annual Wellness Visit  [facility] and the medical providers at [facility] strive to bring you the best medical care.  In doing so we not only want to address your current medical conditions and concerns but also to detect new conditions early and prevent illness, disease and health-related problems.    Medicare offers a yearly Wellness Visit which allows our clinical staff to assess your need for preventative services including immunizations, lifestyle education, counseling to decrease risk of preventable diseases and screening for fall risk and other medical concerns.    This visit is provided free of charge (no copay) for all Medicare recipients. The clinical pharmacists at [facility] have begun to conduct these Wellness Visits which will also include a thorough review of all your medications.    As you primary medical provider recommend that you make an appointment for your Annual Wellness Visit if you have not done so already this year.  You may set up this appointment before you leave today or you may call back ([phone]) and schedule an appointment.  Please make sure when you call that you mention that you are scheduling your Annual Wellness Visit with the clinical pharmacist so that the appointment may be made for the proper length of time.     Continue current medications. Continue good therapeutic lifestyle changes which include good diet and exercise. Fall precautions discussed with patient. If an FOBT was given today- please return it to our front desk. If you are over 56 years old - you may need Prevnar 5 or the adult Pneumonia vaccine.  **Flu shots are available--- please call and schedule a FLU-CLINIC appointment**  After your visit with [place] today you will receive a survey in the mail or online from [employer] regarding your care with [place]. Please take a moment to fill this out. Your feedback is very  important to [place] as you can help [place] better understand your patient needs as well as improve your experience and satisfaction. WE CARE ABOUT YOU!!!   Continue to check blood sugars regularly Make sure the patient drinks plenty of water and stays well-hydrated Follow-up with neurology as planned and especially if she has any more spells like we have observed recently Be sure to get the flu shot toward the middle or end of October.

## 2018-06-19 LAB — BMP8+EGFR
BUN/Creatinine Ratio: 12 (ref 12–28)
BUN: 11 mg/dL (ref 8–27)
CO2: 22 mmol/L (ref 20–29)
CREATININE: 0.94 mg/dL (ref 0.57–1.00)
Calcium: 9.6 mg/dL (ref 8.7–10.3)
Chloride: 107 mmol/L — ABNORMAL HIGH (ref 96–106)
GFR calc Af Amer: 63 mL/min/{1.73_m2} (ref >59)
GFR, EST NON AFRICAN AMERICAN: 55 mL/min/{1.73_m2} — AB (ref >59)
Glucose: 149 mg/dL — ABNORMAL HIGH (ref 65–99)
Potassium: 4.8 mmol/L (ref 3.5–5.2)
SODIUM: 144 mmol/L (ref 134–144)

## 2018-06-19 LAB — CBC WITH DIFFERENTIAL/PLATELET
Basophils Absolute: 0 x10E3/uL (ref 0.0–0.2)
Basos: 0 %
EOS (ABSOLUTE): 0.2 x10E3/uL (ref 0.0–0.4)
Eos: 1 %
Hematocrit: 37.1 % (ref 34.0–46.6)
Hemoglobin: 12.7 g/dL (ref 11.1–15.9)
Immature Grans (Abs): 0 x10E3/uL (ref 0.0–0.1)
Immature Granulocytes: 0 %
Lymphocytes Absolute: 7.9 x10E3/uL — ABNORMAL HIGH (ref 0.7–3.1)
Lymphs: 52 %
MCH: 31.2 pg (ref 26.6–33.0)
MCHC: 34.2 g/dL (ref 31.5–35.7)
MCV: 91 fL (ref 79–97)
Monocytes Absolute: 1 x10E3/uL — ABNORMAL HIGH (ref 0.1–0.9)
Monocytes: 6 %
Neutrophils Absolute: 6.2 x10E3/uL (ref 1.4–7.0)
Neutrophils: 41 %
Platelets: 183 x10E3/uL (ref 150–450)
RBC: 4.07 x10E6/uL (ref 3.77–5.28)
RDW: 14.3 % (ref 12.3–15.4)
WBC: 15.4 x10E3/uL — ABNORMAL HIGH (ref 3.4–10.8)

## 2018-06-19 LAB — LIPID PANEL
CHOL/HDL RATIO: 2.2 ratio (ref 0.0–4.4)
Cholesterol, Total: 126 mg/dL (ref 100–199)
HDL: 57 mg/dL (ref >39)
LDL Calculated: 22 mg/dL (ref 0–99)
Triglycerides: 237 mg/dL — ABNORMAL HIGH (ref 0–149)
VLDL Cholesterol Cal: 47 mg/dL — ABNORMAL HIGH (ref 5–40)

## 2018-06-19 LAB — HEPATIC FUNCTION PANEL
ALT: 12 IU/L (ref 0–32)
AST: 16 IU/L (ref 0–40)
Albumin: 4.2 g/dL (ref 3.5–4.7)
Alkaline Phosphatase: 51 IU/L (ref 39–117)
Bilirubin Total: 0.4 mg/dL (ref 0.0–1.2)
Bilirubin, Direct: 0.14 mg/dL (ref 0.00–0.40)
Total Protein: 6 g/dL (ref 6.0–8.5)

## 2018-06-19 LAB — THYROID PANEL WITH TSH
Free Thyroxine Index: 2.5 (ref 1.2–4.9)
T3 UPTAKE RATIO: 29 % (ref 24–39)
T4 TOTAL: 8.7 ug/dL (ref 4.5–12.0)
TSH: 1.25 u[IU]/mL (ref 0.450–4.500)

## 2018-06-19 LAB — VITAMIN D 25 HYDROXY (VIT D DEFICIENCY, FRACTURES): VIT D 25 HYDROXY: 38.7 ng/mL (ref 30.0–100.0)

## 2018-06-20 ENCOUNTER — Other Ambulatory Visit: Payer: Self-pay | Admitting: Family Medicine

## 2018-06-23 ENCOUNTER — Other Ambulatory Visit: Payer: Medicare Other

## 2018-06-23 DIAGNOSIS — Z1211 Encounter for screening for malignant neoplasm of colon: Secondary | ICD-10-CM

## 2018-06-26 LAB — FECAL OCCULT BLOOD, IMMUNOCHEMICAL: FECAL OCCULT BLD: NEGATIVE

## 2018-06-30 ENCOUNTER — Emergency Department (HOSPITAL_COMMUNITY)
Admission: EM | Admit: 2018-06-30 | Discharge: 2018-06-30 | Disposition: A | Discharge status: Home / Self Care | Payer: Medicare Other | Attending: Emergency Medicine | Admitting: Emergency Medicine

## 2018-06-30 ENCOUNTER — Other Ambulatory Visit: Payer: Self-pay

## 2018-06-30 ENCOUNTER — Encounter (HOSPITAL_COMMUNITY): Payer: Self-pay | Admitting: Emergency Medicine

## 2018-06-30 DIAGNOSIS — Z7982 Long term (current) use of aspirin: Secondary | ICD-10-CM | POA: Insufficient documentation

## 2018-06-30 DIAGNOSIS — Z96653 Presence of artificial knee joint, bilateral: Secondary | ICD-10-CM | POA: Diagnosis not present

## 2018-06-30 DIAGNOSIS — Z7984 Long term (current) use of oral hypoglycemic drugs: Secondary | ICD-10-CM | POA: Diagnosis not present

## 2018-06-30 DIAGNOSIS — R4182 Altered mental status, unspecified: Secondary | ICD-10-CM | POA: Diagnosis present

## 2018-06-30 DIAGNOSIS — N183 Chronic kidney disease, stage 3 (moderate): Secondary | ICD-10-CM | POA: Diagnosis not present

## 2018-06-30 DIAGNOSIS — I129 Hypertensive chronic kidney disease with stage 1 through stage 4 chronic kidney disease, or unspecified chronic kidney disease: Secondary | ICD-10-CM | POA: Diagnosis not present

## 2018-06-30 DIAGNOSIS — Z79899 Other long term (current) drug therapy: Secondary | ICD-10-CM | POA: Insufficient documentation

## 2018-06-30 DIAGNOSIS — R410 Disorientation, unspecified: Secondary | ICD-10-CM | POA: Diagnosis not present

## 2018-06-30 DIAGNOSIS — E1122 Type 2 diabetes mellitus with diabetic chronic kidney disease: Secondary | ICD-10-CM | POA: Insufficient documentation

## 2018-06-30 DIAGNOSIS — I10 Essential (primary) hypertension: Secondary | ICD-10-CM | POA: Diagnosis not present

## 2018-06-30 DIAGNOSIS — R404 Transient alteration of awareness: Secondary | ICD-10-CM | POA: Diagnosis not present

## 2018-06-30 DIAGNOSIS — G459 Transient cerebral ischemic attack, unspecified: Secondary | ICD-10-CM | POA: Diagnosis not present

## 2018-06-30 HISTORY — DX: Cerebral infarction, unspecified: I63.9

## 2018-06-30 LAB — URINALYSIS, ROUTINE W REFLEX MICROSCOPIC
Bilirubin Urine: NEGATIVE
Glucose, UA: 50 mg/dL — AB
Hgb urine dipstick: NEGATIVE
Ketones, ur: NEGATIVE mg/dL
Nitrite: NEGATIVE
Protein, ur: NEGATIVE mg/dL
Specific Gravity, Urine: 1.018 (ref 1.005–1.030)
pH: 5 (ref 5.0–8.0)

## 2018-06-30 LAB — CBC WITH DIFFERENTIAL/PLATELET
Basophils Absolute: 0 10*3/uL (ref 0.0–0.1)
Basophils Relative: 0 %
Eosinophils Absolute: 0.2 10*3/uL (ref 0.0–0.7)
Eosinophils Relative: 1 %
HCT: 37.3 % (ref 36.0–46.0)
Hemoglobin: 12.3 g/dL (ref 12.0–15.0)
Lymphocytes Relative: 58 %
Lymphs Abs: 9.9 10*3/uL — ABNORMAL HIGH (ref 0.7–4.0)
MCH: 30.6 pg (ref 26.0–34.0)
MCHC: 33 g/dL (ref 30.0–36.0)
MCV: 92.8 fL (ref 78.0–100.0)
Monocytes Absolute: 0.9 10*3/uL (ref 0.1–1.0)
Monocytes Relative: 5 %
Neutro Abs: 6.2 10*3/uL (ref 1.7–7.7)
Neutrophils Relative %: 36 %
Platelets: 167 10*3/uL (ref 150–400)
RBC: 4.02 MIL/uL (ref 3.87–5.11)
RDW: 13.9 % (ref 11.5–15.5)
WBC: 17.2 10*3/uL — ABNORMAL HIGH (ref 4.0–10.5)

## 2018-06-30 LAB — BASIC METABOLIC PANEL
Anion gap: 5 (ref 5–15)
BUN: 12 mg/dL (ref 8–23)
CO2: 26 mmol/L (ref 22–32)
Calcium: 9 mg/dL (ref 8.9–10.3)
Chloride: 105 mmol/L (ref 98–111)
Creatinine, Ser: 0.9 mg/dL (ref 0.44–1.00)
GFR calc Af Amer: >60 mL/min (ref >60)
GFR calc non Af Amer: 56 mL/min — ABNORMAL LOW (ref >60)
Glucose, Bld: 215 mg/dL — ABNORMAL HIGH (ref 70–99)
Potassium: 4.1 mmol/L (ref 3.5–5.1)
Sodium: 136 mmol/L (ref 135–145)

## 2018-06-30 LAB — CBG MONITORING, ED: Glucose-Capillary: 191 mg/dL — ABNORMAL HIGH (ref 70–99)

## 2018-06-30 NOTE — ED Notes (Signed)
Pt was informed that we need a urine sample. Pt states that she can not urinate at this time. 

## 2018-06-30 NOTE — ED Triage Notes (Signed)
Per Ems called out for confusion began at 310pm. No other deficits noted beside confusion. Pt was recently seen two-3 weeks ago for TIA and had MRI with no acute changes.

## 2018-07-01 ENCOUNTER — Telehealth: Payer: Self-pay | Admitting: Neurology

## 2018-07-01 MED ORDER — LEVETIRACETAM 250 MG PO TABS: ORAL_TABLET | ORAL | 3 refills | Status: DC

## 2018-07-01 NOTE — Telephone Encounter (Signed)
I called the patient, talk with husband.  The patient had an event yesterday of transient confusion, she appeared to be disoriented but was not staring off, the episode lasted about 30 minutes and then cleared.  At this point, we will start Keppra empirically taking 250 mg twice daily for 2 weeks then go to 500 mg twice daily.  They will call if there are side effects noted on the medication.

## 2018-07-06 DIAGNOSIS — H401133 Primary open-angle glaucoma, bilateral, severe stage: Secondary | ICD-10-CM | POA: Diagnosis not present

## 2018-07-08 ENCOUNTER — Telehealth: Payer: Self-pay | Admitting: Neurology

## 2018-07-08 ENCOUNTER — Encounter: Payer: Self-pay | Admitting: Neurology

## 2018-07-08 ENCOUNTER — Ambulatory Visit: Payer: Medicare Other | Admitting: Neurology

## 2018-07-08 VITALS — BP 135/81 | HR 67 | Ht 63.0 in | Wt 172.5 lb

## 2018-07-08 DIAGNOSIS — R404 Transient alteration of awareness: Secondary | ICD-10-CM | POA: Diagnosis not present

## 2018-07-08 NOTE — Progress Notes (Signed)
Reason for visit: Transient alteration of awareness  Kristy [name] is an 84 y.o. female  History of present illness:  Kristy [name] is an 84 year old right-handed white female with a history of events of staring off lasting 5 to 10 minutes, she has had 2 such episodes both while eating breakfast at a restaurant.  The patient does have a history of diabetes, the family has not checked a blood sugar during an event.  The first 2 events however occurred after eating.  The patient has had 2 other events, one on 30 June 2018 and a second event on 02 July 2018.  Both of these events were associated with what sounds like a transient episode of aphasia.  The first event lasted 30 minutes, the second lasted an hour or 2.  The patient has no recollection of events that occurred during these episodes.  The patient was trying to talk, she apparently was unable to complete a sentence, she was not able to understand what people were saying.  She was ambulatory during the events.  The patient has just started Keppra, she is currently on 250 mg twice daily with plans to go to 500 mg twice daily.  So far she is tolerating medication well.  She comes to this office for an evaluation.  A recent carotid Doppler study and an EEG study were unremarkable.  Past Medical History:  Diagnosis Date  . Anxiety   . Colon polyps   . Complication of anesthesia 01-18-2013   slow to awaken and felt crazy for 3-4 days  . Diabetes mellitus   . Diverticulitis   . H/O hiatal hernia   . Hearing loss 01-07-13   bilateral hearing aids  . History of shingles 01-07-13   3'14 recent outbreak- drying in   . HOH (hard of hearing) 02/05/2018  . Hypercholesteremia   . Hypertension    Dr. Laurance Flatten  . Lumbar spondylosis    osteoarthritis-knees  . Macular degeneration of both eyes    and dry eyes  . Stroke [facility])    TIA    Past Surgical History:  Procedure Laterality Date  . ABDOMINAL HYSTERECTOMY  1987  . APPENDECTOMY     pt  thinks this was removed when gall bladder was taken out  . CATARACT EXTRACTION    . CHOLECYSTECTOMY  1989   open  . KNEE ARTHROSCOPY  01-07-13   left knee- many yrs ago  . Lumbar back surgery  2013  . TOTAL KNEE ARTHROPLASTY Left 01/18/2013   Procedure: TOTAL KNEE ARTHROPLASTY;  Surgeon: Gearlean Alf, MD;  Location: [facility];  Service: Orthopedics;  Laterality: Left;  . TOTAL KNEE ARTHROPLASTY Right 03/07/2014   Procedure: RIGHT TOTAL KNEE ARTHROPLASTY;  Surgeon: Gearlean Alf, MD;  Location: [facility];  Service: Orthopedics;  Laterality: Right;  . VENTRAL HERNIA REPAIR      Family History  Problem Relation Age of Onset  . Stroke Father 41  . Heart disease Father   . Irregular heart beat Father        pacemaker   . Healthy Mother   . Healthy Son   . Heart disease Sister        unknown heart condition   . Bipolar disorder Son   . COPD Son   . Cancer Sister        colon    Social history:  reports that she has never smoked. She has never used smokeless tobacco. She reports that she does  not drink alcohol or use drugs.    Allergies  Allergen Reactions  . Vioxx [Rofecoxib] Other (See Comments)    Ulcers in mouth     Medications:  Prior to Admission medications   Medication Sig Start Date End Date Taking? Authorizing Provider  ADVOCATE LANCETS MISC Check Blood Sugar bid. Dx E11.9 11/28/14  Yes Chipper Herb, MD  ALPRAZolam Duanne Moron) 0.25 MG tablet TAKE 1/2 TABLET BY MOUTH TWICE A DAY AND 1 AT BEDTIME 05/20/18  Yes Chipper Herb, MD  aspirin EC 81 MG tablet Take 81 mg by mouth daily.   Yes [provider]  busPIRone (BUSPAR) 30 MG tablet TAKE 1 TABLET (30 MG TOTAL) BY MOUTH 2 (TWO) TIMES DAILY. 06/09/18  Yes Chipper Herb, MD  cholecalciferol (VITAMIN D) 1000 UNITS tablet Take 1,000 Units by mouth daily.   Yes [provider]  ezetimibe (ZETIA) 10 MG tablet TAKE 1 TABLET BY MOUTH EVERY DAY 04/23/18  Yes Chipper Herb, MD  furosemide (LASIX) 40 MG tablet Take  40 mg by mouth 2 (two) times daily as needed for fluid.    Yes [provider]  glimepiride (AMARYL) 4 MG tablet TAKE 1 TABLET (4 MG TOTAL) BY MOUTH DAILY WITH BREAKFAST. 06/08/18  Yes Chipper Herb, MD  glucose blood (FREESTYLE TEST STRIPS) test strip Check Blood sugar two times daily 08/20/17  Yes Chipper Herb, MD  hydroxypropyl methylcellulose (ISOPTO TEARS) 2.5 % ophthalmic solution Place 1 drop into both eyes 2 (two) times daily as needed (dry eyes).   Yes [provider]  levETIRAcetam (KEPPRA) 250 MG tablet 1 tablet twice daily for 2 weeks then take 2 tablets twice daily 07/01/18  Yes Kathrynn Ducking, MD  lisinopril (PRINIVIL,ZESTRIL) 10 MG tablet TAKE 1/2 TABLET DAILY AS DIRECTED 06/22/18  Yes Chipper Herb, MD  LUMIGAN 0.01 % SOLN  11/09/15  Yes [provider]  metFORMIN (GLUCOPHAGE) 1000 MG tablet TAKE 1 TABLET BY MOUTH WITH MEALS BREAKFAST AND SUPPER 05/11/18  Yes Chipper Herb, MD  metoprolol succinate (TOPROL-XL) 25 MG 24 hr tablet TAKE 1 TABLET BY MOUTH EVERY DAY 02/03/18  Yes Chipper Herb, MD  Multiple Vitamins-Minerals (PRESERVISION AREDS 2 PO) Take by mouth.   Yes [provider]  Omega-3 Fatty Acids (FISH OIL) 1000 MG CAPS Take 1 capsule by mouth daily.   Yes [provider]  pravastatin (PRAVACHOL) 80 MG tablet TAKE ONE TABLET BY MOUTH AT BEDTIME 06/03/18  Yes Chipper Herb, MD    ROS:  Out of a complete 14 system review of symptoms, the patient complains only of the following symptoms, and all other reviewed systems are negative.  Loss of vision Memory loss, headache, speech difficulty Daytime sleepiness  Blood pressure 135/81, pulse 67, height 5\' 3"  (1.6 m), weight 172 lb 8 oz (78.2 kg).  Physical Exam  General: The patient is alert and cooperative at the time of the examination.  Skin: No significant peripheral edema is noted.   Neurologic Exam  Mental status: The patient is alert and oriented x 3 at the time of  the examination. The patient has apparent normal recent and remote memory, with an apparently normal attention span and concentration ability.   Cranial nerves: Facial symmetry is present. Speech is normal, no aphasia or dysarthria is noted. Extraocular movements are full. Visual fields are full.  Motor: The patient has good strength in all 4 extremities.  Sensory examination: Soft touch sensation is symmetric on  the face, arms, and legs.  Coordination: The patient has good finger-nose-finger and heel-to-shin bilaterally.  Gait and station: The patient has a normal gait. Romberg is negative. No drift is seen.  Reflexes: Deep tendon reflexes are symmetric.   MRI brain 06/14/18:  IMPRESSION: This MRI of the brain without contrast shows the following: 1. Stable moderate generalized cortical atrophy that is most pronounced in the mesial temporal lobes and the peri-insular region. 2. Mild chronic microvascular ischemic changes, unchanged compared to the 02/10/2018 MRI. 3. There are no acute findings.  * MRI scan images were reviewed online. I agree with the written report.    Assessment/Plan:  1.  Transient episodes of altered awareness  The patient has had 4 total events, the first 2 were associated with staring off, the second 2 were more prolonged and associated with episodes of what sounds like aphasia.  The patient had no recollection for any of these events.  Given the different clinical manifestations of the episodes, the patient will be sent for a CT angiogram of the head and neck to fully exclude TIA as a possible etiology.  The carotid Doppler study did look okay.  The patient will continue her ramp up on the Keppra dosing.  She will follow-up in 3 months.  I have asked the family to check a blood sugar if another event occurs.  Jill Alexanders MD 07/08/2018 8:12 AM  [facility] [address] [place], [place] [address]  Phone  [phone] Fax [phone]

## 2018-07-08 NOTE — Patient Instructions (Signed)
Continue Keppra as prescribed.

## 2018-07-08 NOTE — Telephone Encounter (Signed)
[employer] medicare order sent to GI. No auth they will reach out to the pt to schedule.  °

## 2018-07-08 NOTE — Telephone Encounter (Signed)
Kynzley picked the phone number but then [name] her husband took the phone they are aware of the order sent to [facility] and to call them at [phone] if they havent heard in the next  2-3 business day.

## 2018-07-10 NOTE — ED Provider Notes (Signed)
[facility] EMERGENCY DEPARTMENT Provider Note   CSN: [id number] Arrival date & time: 06/30/18  1623     History   Chief Complaint Chief Complaint  Patient presents with  . Altered Mental Status    HPI LEXXI KOSLOW is a 84 y.o. female.  HPI   84 year old female with some confusion.  Began shortly after 3 PM today.  Family describes sensation where she will stare off into space and sometimes have difficulty finding her words.  Currently resolved.  She has had similar symptoms previously.  Evaluated by neurology.  Felt that may possibly be seizures.  Past Medical History:  Diagnosis Date  . Anxiety   . Colon polyps   . Complication of anesthesia 01-18-2013   slow to awaken and felt crazy for 3-4 days  . Diabetes mellitus   . Diverticulitis   . H/O hiatal hernia   . Hearing loss 01-07-13   bilateral hearing aids  . History of shingles 01-07-13   3'14 recent outbreak- drying in   . HOH (hard of hearing) 02/05/2018  . Hypercholesteremia   . Hypertension    Dr. Laurance Flatten  . Lumbar spondylosis    osteoarthritis-knees  . Macular degeneration of both eyes    and dry eyes  . Stroke [facility])    TIA    Patient Active Problem List   Diagnosis Date Noted  . HOH (hard of hearing) 02/05/2018  . Incisional hernia, without obstruction or gangrene 05/27/2017  . Type 2 diabetes mellitus with stage 3 chronic kidney disease ([place]) 07/07/2015  . Type 2 diabetes mellitus with chronic kidney disease and hypertension ([place]) 07/07/2015  . SVT (supraventricular tachycardia) ([place]) 02/08/2015  . Depression 08/24/2013  . Essential hypertension, benign 06/08/2013  . Hyperlipidemia associated with type 2 diabetes mellitus ([place]) 06/08/2013  . Vitamin D deficiency 06/08/2013  . Diabetes mellitus type 2 controlled 06/08/2013  . OA (osteoarthritis) of knee 01/18/2013  . CLL (chronic lymphocytic leukemia) ([place]) 06/17/2012    Past Surgical History:  Procedure Laterality Date  . ABDOMINAL HYSTERECTOMY   1987  . APPENDECTOMY     pt thinks this was removed when gall bladder was taken out  . CATARACT EXTRACTION    . CHOLECYSTECTOMY  1989   open  . KNEE ARTHROSCOPY  01-07-13   left knee- many yrs ago  . Lumbar back surgery  2013  . TOTAL KNEE ARTHROPLASTY Left 01/18/2013   Procedure: TOTAL KNEE ARTHROPLASTY;  Surgeon: Gearlean Alf, MD;  Location: [facility] ORS;  Service: Orthopedics;  Laterality: Left;  . TOTAL KNEE ARTHROPLASTY Right 03/07/2014   Procedure: RIGHT TOTAL KNEE ARTHROPLASTY;  Surgeon: Gearlean Alf, MD;  Location: [facility] ORS;  Service: Orthopedics;  Laterality: Right;  . VENTRAL HERNIA REPAIR       OB History   None      Home Medications    Prior to Admission medications   Medication Sig Start Date End Date Taking? Authorizing Provider  ADVOCATE LANCETS MISC Check Blood Sugar bid. Dx E11.9 11/28/14   Chipper Herb, MD  ALPRAZolam Duanne Moron) 0.25 MG tablet TAKE 1/2 TABLET BY MOUTH TWICE A DAY AND 1 AT BEDTIME 05/20/18   Chipper Herb, MD  aspirin EC 81 MG tablet Take 81 mg by mouth daily.    [provider]  busPIRone (BUSPAR) 30 MG tablet TAKE 1 TABLET (30 MG TOTAL) BY MOUTH 2 (TWO) TIMES DAILY. 06/09/18   Chipper Herb, MD  cholecalciferol (VITAMIN D) 1000 UNITS tablet Take 1,000  Units by mouth daily.    [provider]  ezetimibe (ZETIA) 10 MG tablet TAKE 1 TABLET BY MOUTH EVERY DAY 04/23/18   Chipper Herb, MD  furosemide (LASIX) 40 MG tablet Take 40 mg by mouth 2 (two) times daily as needed for fluid.     [provider]  glimepiride (AMARYL) 4 MG tablet TAKE 1 TABLET (4 MG TOTAL) BY MOUTH DAILY WITH BREAKFAST. 06/08/18   Chipper Herb, MD  glucose blood (FREESTYLE TEST STRIPS) test strip Check Blood sugar two times daily 08/20/17   Chipper Herb, MD  hydroxypropyl methylcellulose (ISOPTO TEARS) 2.5 % ophthalmic solution Place 1 drop into both eyes 2 (two) times daily as needed (dry eyes).    [provider]  levETIRAcetam (KEPPRA) 250  MG tablet 1 tablet twice daily for 2 weeks then take 2 tablets twice daily 07/01/18   Kathrynn Ducking, MD  lisinopril (PRINIVIL,ZESTRIL) 10 MG tablet TAKE 1/2 TABLET DAILY AS DIRECTED 06/22/18   Chipper Herb, MD  LUMIGAN 0.01 % SOLN  11/09/15   [provider]  metFORMIN (GLUCOPHAGE) 1000 MG tablet TAKE 1 TABLET BY MOUTH WITH MEALS BREAKFAST AND SUPPER 05/11/18   Chipper Herb, MD  metoprolol succinate (TOPROL-XL) 25 MG 24 hr tablet TAKE 1 TABLET BY MOUTH EVERY DAY 02/03/18   Chipper Herb, MD  Multiple Vitamins-Minerals (PRESERVISION AREDS 2 PO) Take by mouth.    [provider]  Omega-3 Fatty Acids (FISH OIL) 1000 MG CAPS Take 1 capsule by mouth daily.    [provider]  pravastatin (PRAVACHOL) 80 MG tablet TAKE ONE TABLET BY MOUTH AT BEDTIME 06/03/18   Chipper Herb, MD    Family History Family History  Problem Relation Age of Onset  . Stroke Father 19  . Heart disease Father   . Irregular heart beat Father        pacemaker   . Healthy Mother   . Healthy Son   . Heart disease Sister        unknown heart condition   . Bipolar disorder Son   . COPD Son   . Cancer Sister        colon    Social History Social History   Tobacco Use  . Smoking status: Never Smoker  . Smokeless tobacco: Never Used  Substance Use Topics  . Alcohol use: No    Alcohol/week: 0.0 standard drinks  . Drug use: No     Allergies   Vioxx [rofecoxib]   Review of Systems Review of Systems  All systems reviewed and negative, other than as noted in HPI.  Physical Exam Updated Vital Signs BP 136/79   Pulse 72   Resp (!) 22   Wt 78.4 kg   SpO2 98%   BMI 30.62 kg/m   Physical Exam  Constitutional: She is oriented to person, place, and time. She appears well-developed and well-nourished. No distress.  HENT:  Head: Normocephalic and atraumatic.  Eyes: Conjunctivae are normal. Right eye exhibits no discharge. Left eye exhibits no discharge.  Neck: Neck supple.    Cardiovascular: Normal rate, regular rhythm and normal heart sounds. Exam reveals no gallop and no friction rub.  No murmur heard. Pulmonary/Chest: Effort normal and breath sounds normal. No respiratory distress.  Abdominal: Soft. She exhibits no distension. There is no tenderness.  Musculoskeletal: She exhibits no edema or tenderness.  Neurological: She is alert and oriented to person, place, and time. She displays normal reflexes. No  cranial nerve deficit or sensory deficit. She exhibits normal muscle tone.  Skin: Skin is warm and dry.  Psychiatric: She has a normal mood and affect. Her behavior is normal. Thought content normal.  Nursing note and vitals reviewed.    ED Treatments / Results  Labs (all labs ordered are listed, but only abnormal results are displayed) Labs Reviewed  CBC WITH DIFFERENTIAL/PLATELET - Abnormal; Notable for the following components:      Result Value   WBC 17.2 (*)    Lymphs Abs 9.9 (*)    All other components within normal limits  BASIC METABOLIC PANEL - Abnormal; Notable for the following components:   Glucose, Bld 215 (*)    GFR calc non Af Amer 56 (*)    All other components within normal limits  URINALYSIS, ROUTINE W REFLEX MICROSCOPIC - Abnormal; Notable for the following components:   Glucose, UA 50 (*)    Leukocytes, UA TRACE (*)    Bacteria, UA RARE (*)    All other components within normal limits  CBG MONITORING, ED - Abnormal; Notable for the following components:   Glucose-Capillary 191 (*)    All other components within normal limits    EKG EKG Interpretation  Date/Time:  Tuesday June 30 2018 16:27:48 EDT Ventricular Rate:  77 PR Interval:    QRS Duration: 95 QT Interval:  380 QTC Calculation: 430 R Axis:   -48 Text Interpretation:  Sinus rhythm Left anterior fascicular block Confirmed by Virgel Manifold [id number]) on 06/30/2018 4:47:41 PM   Radiology No results found.   Dg Chest 2 View  Result Date: 06/19/2018 CLINICAL  DATA:  Hypertension. EXAM: CHEST - 2 VIEW COMPARISON:  Chest x-ray dated March 29, 2016. FINDINGS: The heart size and mediastinal contours are within normal limits. Normal pulmonary vascularity. No focal consolidation, pleural effusion, or pneumothorax. No acute osseous abnormality. IMPRESSION: No active cardiopulmonary disease. Electronically Signed   By: Titus Dubin M.D.   On: 06/19/2018 08:34   Mr Brain Wo Contrast  Result Date: 06/13/2018  [facility] [address], [place], [place] [address] [phone] NEUROIMAGING REPORT STUDY DATE: 06/13/2018 PATIENT NAME: REMMY CRASS DOB: 09-23-1928 MRN: [id number] EXAM: MRI Brain without contrast ORDERING CLINICIAN: Kathrynn Ducking, MD CLINICAL HISTORY: 84 year old woman with transient alteration of awareness COMPARISON FILMS: MRI 02/10/2018 TECHNIQUE: MRI of the brain without contrast was obtained utilizing 5 mm axial slices with T1, T2, T2 flair, SWI and diffusion weighted views.  T1 sagittal and T2 coronal views were obtained. CONTRAST: none IMAGING SITE: [facility], [place], [place] FINDINGS: On sagittal images, the spinal cord is imaged caudally to C3-C4 and is normal in caliber.   The contents of the posterior fossa are of normal size and position.   The pituitary gland and optic chiasm appear normal.    There is moderate generalized cortical atrophy that is most pronounced in the mesial temporal lobes and in the peri-insular region..  There are no abnormal extra-axial collections of fluid.  The cerebellum and brainstem appears normal.   The deep gray matter appears normal.  There are some T2/FLAIR hypertense foci predominantly in the deep white matter of both hemispheres.  None of these appear to be acute.  There is no significant change compared to the earlier MRI.Marland Kitchen  Diffusion weighted images are normal.  Susceptibility weighted images are normal.  The VIIth/VIIIth nerve complex appears normal.  Bilateral lens  replacements are noted.  The orbits are otherwise normal.  The mastoid air cells appear normal.  The paranasal sinuses appear normal.  Flow voids are identified within the major intracerebral arteries.     This MRI of the brain without contrast shows the following: 1.    Stable moderate generalized cortical atrophy that is most pronounced in the mesial temporal lobes and the peri-insular region. 2.    Mild chronic microvascular ischemic changes, unchanged compared to the 02/10/2018 MRI. 3.    There are no acute findings. INTERPRETING PHYSICIAN: Richard A. Felecia Shelling, MD, PhD, FAAN Certified in  Neuroimaging by [employer]    Procedures Procedures (including critical care time)  Medications Ordered in ED Medications - No data to display   Initial Impression / Assessment and Plan / ED Course  I have reviewed the triage vital signs and the nursing notes.  Pertinent labs & imaging results that were available during my care of the patient were reviewed by me and considered in my medical decision making (see chart for details).     84 year old female with transient alterations in her mentation.  Family describes episodes where she seems to stare off.  Possibly some expressive aphasia.  Currently asymptomatic.  She is been evaluated by neurology for the same.  Felt that possibly may be seizures.  ED work-up fairly unremarkable.  I feel she is appropriate for outpatient follow-up.  Advised to contact her neurologist for appointment or further recommendations. Final Clinical Impressions(s) / ED Diagnoses   Final diagnoses:  Altered mental status, unspecified altered mental status type    ED Discharge Orders    None       Virgel Manifold, MD 07/10/18 1723

## 2018-07-14 ENCOUNTER — Ambulatory Visit
Admission: RE | Admit: 2018-07-14 | Discharge: 2018-07-14 | Disposition: A | Discharge status: Home / Self Care | Payer: Medicare Other | Source: Ambulatory Visit | Attending: Neurology | Admitting: Neurology

## 2018-07-14 ENCOUNTER — Telehealth: Payer: Self-pay | Admitting: Neurology

## 2018-07-14 DIAGNOSIS — R404 Transient alteration of awareness: Secondary | ICD-10-CM

## 2018-07-14 DIAGNOSIS — R2689 Other abnormalities of gait and mobility: Secondary | ICD-10-CM | POA: Diagnosis not present

## 2018-07-14 DIAGNOSIS — I6523 Occlusion and stenosis of bilateral carotid arteries: Secondary | ICD-10-CM | POA: Diagnosis not present

## 2018-07-14 MED ORDER — IOPAMIDOL (ISOVUE-370) INJECTION 76%
75.0000 mL | Freq: Once | INTRAVENOUS | Status: AC | PRN
  Administered 2018-07-14: 75 mL via INTRAVENOUS

## 2018-07-14 NOTE — Telephone Encounter (Signed)
I called the patient, I talk with husband.  The CT angiogram of the head and neck did not show any critical blockages.  The patient will remain on the [place] for now, they will call if further episodes are noted.     CTA head and neck 07/14/18:  IMPRESSION: 1. Negative for emergent large vessel occlusion 2. Moderate stenosis of the posterior cerebral artery bilaterally. 3. Atherosclerotic disease of the carotid bifurcation bilaterally without significant stenosis. No significant vertebral stenosis.

## 2018-07-26 ENCOUNTER — Other Ambulatory Visit: Payer: Self-pay | Admitting: Family Medicine

## 2018-07-27 ENCOUNTER — Ambulatory Visit (INDEPENDENT_AMBULATORY_CARE_PROVIDER_SITE_OTHER): Payer: Medicare Other

## 2018-07-27 DIAGNOSIS — Z23 Encounter for immunization: Secondary | ICD-10-CM | POA: Diagnosis not present

## 2018-07-30 ENCOUNTER — Other Ambulatory Visit: Payer: Self-pay | Admitting: Family Medicine

## 2018-08-04 ENCOUNTER — Other Ambulatory Visit: Payer: Self-pay | Admitting: Family Medicine

## 2018-08-24 ENCOUNTER — Other Ambulatory Visit: Payer: Self-pay | Admitting: Family Medicine

## 2018-08-24 ENCOUNTER — Telehealth: Payer: Self-pay | Admitting: Family Medicine

## 2018-08-24 MED ORDER — GLUCOSE BLOOD VI STRP: ORAL_STRIP | 2 refills | Status: DC

## 2018-08-24 NOTE — Telephone Encounter (Signed)
Test strips rx sent over to pharmacy, tried to call pt but phone busy.

## 2018-08-28 ENCOUNTER — Other Ambulatory Visit: Payer: Self-pay | Admitting: Family Medicine

## 2018-08-31 ENCOUNTER — Other Ambulatory Visit: Payer: Self-pay | Admitting: Family Medicine

## 2018-09-09 ENCOUNTER — Other Ambulatory Visit: Payer: Self-pay | Admitting: Family Medicine

## 2018-09-11 ENCOUNTER — Other Ambulatory Visit: Payer: Self-pay | Admitting: *Deleted

## 2018-09-11 MED ORDER — ONETOUCH ULTRASOFT LANCETS MISC: 3 refills | Status: DC

## 2018-09-22 ENCOUNTER — Other Ambulatory Visit: Payer: Self-pay | Admitting: Family Medicine

## 2018-10-07 ENCOUNTER — Other Ambulatory Visit: Payer: Self-pay | Admitting: Family Medicine

## 2018-10-14 ENCOUNTER — Telehealth: Payer: Self-pay | Admitting: Family Medicine

## 2018-10-14 MED ORDER — OSELTAMIVIR PHOSPHATE 75 MG PO CAPS: 75.0000 mg | ORAL_CAPSULE | Freq: Every day | ORAL | 0 refills | Status: DC

## 2018-10-14 NOTE — Telephone Encounter (Signed)
Call Tamiflu 75 mg and 1 twice daily because husband was diagnosed with the flu.  Make sure that she is taking Mucinex, maximum strength, plain, 1 twice daily with a large glass of water, take Tylenol if needed for aches pains and fever.  Make sure that patient is monitored closely by son or relatives.  May need to talk to Legrand Como her son.  If trouble getting up with Loretta Plume may be of some help.

## 2018-10-14 NOTE — Telephone Encounter (Signed)
Son aware  - will get the OTC meds and tamiflu sent in - [facility]

## 2018-10-14 NOTE — Telephone Encounter (Signed)
What symptoms do you have? Husband is in the hospital with the flu and Kristy [name] is coughing real bad and it's getting worse  How long have you been sick? 2 days  Have you been seen for this problem? In the past  If your provider decides to give you a prescription, which pharmacy would you like for it to be sent to[facility] [place]   Patient informed that this information will be sent to the clinical staff for review and that they should receive a follow up call.

## 2018-10-15 ENCOUNTER — Ambulatory Visit (INDEPENDENT_AMBULATORY_CARE_PROVIDER_SITE_OTHER): Payer: Medicare Other | Admitting: Family

## 2018-10-15 ENCOUNTER — Encounter: Payer: Self-pay | Admitting: Family

## 2018-10-15 VITALS — BP 123/71 | HR 115 | Temp 97.3°F | Ht 63.0 in | Wt 169.0 lb

## 2018-10-15 DIAGNOSIS — Z87440 Personal history of urinary (tract) infections: Secondary | ICD-10-CM

## 2018-10-15 DIAGNOSIS — R410 Disorientation, unspecified: Secondary | ICD-10-CM

## 2018-10-15 LAB — URINALYSIS
Bilirubin, UA: NEGATIVE
GLUCOSE, UA: NEGATIVE
Leukocytes, UA: NEGATIVE
NITRITE UA: NEGATIVE
Urobilinogen, Ur: 0.2 mg/dL (ref 0.2–1.0)
pH, UA: 5.5 (ref 5.0–7.5)

## 2018-10-15 NOTE — Patient Instructions (Signed)
Confusion °Confusion is the inability to think with the usual speed or clarity. People who are confused often describe their thinking as cloudy or unclear. Confusion can also include feeling disoriented. This means you are unaware of where you are or who you are. You may also not know the date or time. When confused, you may have difficulty remembering, paying attention, or making decisions. Some people also act aggressively when they are confused. °In some cases, confusion may come on quickly. In other cases, it may develop slowly over time. How quickly confusion comes on depends on the cause. °Confusion may be caused by: °· Head injury (concussion). °· Seizures. °· Stroke. °· Fever. °· Brain tumor. °· Decrease in brain function due to a vascular or neurologic condition (dementia). °· Emotions, like rage or terror. °· Inability to know what is real and what is not (hallucinations). °· Infections, such as a urinary tract infection (UTI). °· Using too much alcohol, drugs, or medicines. °· Loss of fluid (dehydration) or an imbalance of salts in the body (electrolytes). °· Lack of sleep. °· Low blood sugar (diabetes). °· Low levels of oxygen. This comes from conditions such as chronic lung disorders. °· Side effects of medicines, or taking medicines that affect other medicines (drug interactions). °· Lack of certain nutrients, especially niacin, thiamine, vitamin C, or vitamin B. °· Sudden drop in body temperature (hypothermia). °· Change in routine, such as traveling or being hospitalized. °Follow these instructions at home: °Pay attention to your symptoms. Tell your health care provider about any changes or if you develop new symptoms. Follow these instructions to control or treat symptoms. Ask a family member or friend for help if needed. °Medicines °· Take over-the-counter and prescription medicines only as told by your health care provider. °· Ask your health care provider about changing or stopping any medicines  that may be causing your confusion. °· Avoid pain medicines or sleep medicines until you have fully recovered. °· Use a pillbox or an alarm to help you take the right medicines at the right time. °Lifestyle ° °· Eat a balanced diet that includes fruits and vegetables. °· Get enough sleep. For most adults, this is 7-9 hours each night. °· Do not drink alcohol. °· Do not become isolated. Spend time with other people and make plans for your days. °· Do not drive until your health care provider says that it is safe to do so. °· Do not use any products that contain nicotine or tobacco, such as cigarettes and e-cigarettes. If you need help quitting, ask your health care provider. °· Stop other activities that may increase your chances of getting hurt. These may include some work duties, sports activities, swimming, or bike riding. Ask your health care provider what activities are safe for you. °What caregivers can do °· Find out if the person is confused. Ask the person to state his or her name, age, and the date. If the person is unsure or answers incorrectly, he or she may be confused. °· Always introduce yourself, no matter how well the person knows you. °· Remind the person of his or her location. Do this often. °· Place a calendar and clock near the person who is confused. °· Talk about current events and plans for the day. °· Keep the environment calm, quiet, and peaceful. °· Help the person do the things that he or she is unable to do. These include: °? Taking medicines. °? Keeping follow-up visits with his or her health care   provider. °? Helping with household duties, including meal preparation. °? Running errands. °· Get help if you need it. There are several support groups for caregivers. °· If the person you are helping needs more support, consider day care, extended care programs, or a skilled nursing facility. The person's health care provider may be able to help evaluate these options. °General  instructions °· Monitor yourself for any conditions you may have. These may include: °? Checking your blood glucose levels, if you have diabetes. °? Watching your weight, if you are overweight. °? Monitoring your blood pressure, if you have hypertension. °? Monitoring your body temperature, if you have a fever. °· Keep all follow-up visits as told by your health care provider. This is important. °Contact a health care provider if: °· Your symptoms get worse. °Get help right away if you: °· Feel that you are not able to care for yourself. °· Develop severe headaches, repeated vomiting, seizures, blackouts, or slurred speech. °· Have increasing confusion, weakness, numbness, restlessness, or personality changes. °· Develop a loss of balance, have marked dizziness, feel uncoordinated, or fall. °· Develop severe anxiety, or you have delusions or hallucinations. °These symptoms may represent a serious problem that is an emergency. Do not wait to see if the symptoms will go away. Get medical help right away. Call your local emergency services (911 in the [place] Do not drive yourself to the hospital. °Summary °· Confusion is the inability to think with the usual speed or clarity. People who are confused often describe their thinking as cloudy or unclear. °· Confusion can also include having difficulty remembering, paying attention, or making decisions. °· Confusion may come on quickly or develop slowly over time, depending on the cause. There are many different causes of confusion. °· Ask for help from family members or friends if you are unable to take care of yourself. °This information is not intended to replace advice given to you by your health care provider. Make sure you discuss any questions you have with your health care provider. °Document Released: 10/24/2004 Document Revised: 09/18/2017 Document Reviewed: 09/18/2017 °Elsevier Interactive Patient Education © 2019 [employer] Inc[employer] ° °

## 2018-10-15 NOTE — Progress Notes (Signed)
   Subjective:    Patient ID: Kristy [name], female    DOB: [birth year], 85 y.o.   MRN: [id number]  Chief Complaint  Patient presents with  . Altered Mental Status    HPI Pt is brought in my granddaughter with acute confusion that started 3 days. She has had UTI's in the past and had similar confusion in the past. Her husband was admitted into the hospital 3 days ago. The family is unsure if it is related to UTI or change with husband not being in the home.   Pt denies any dysuria, frequency.    Review of Systems  Unable to perform ROS: Mental status change  All other systems reviewed and are negative.      Objective:   Physical Exam Vitals signs reviewed.  Constitutional:      General: She is not in acute distress.    Appearance: She is well-developed.     Comments: Alert, confusion at times. HOH  HENT:     Right Ear: External ear normal.  Eyes:     Pupils: Pupils are equal, round, and reactive to light.  Neck:     Musculoskeletal: Normal range of motion and neck supple.     Thyroid: No thyromegaly.  Cardiovascular:     Rate and Rhythm: Normal rate and regular rhythm.     Heart sounds: Normal heart sounds. No murmur.  Pulmonary:     Effort: Pulmonary effort is normal. No respiratory distress.     Breath sounds: Normal breath sounds. No wheezing.  Abdominal:     General: Bowel sounds are normal. There is no distension.     Palpations: Abdomen is soft.     Tenderness: There is no abdominal tenderness.  Musculoskeletal: Normal range of motion.        General: No tenderness.  Skin:    General: Skin is warm and dry.  Neurological:     Mental Status: She is oriented to person, place, and time.     Cranial Nerves: No cranial nerve deficit.     Deep Tendon Reflexes: Reflexes are normal and symmetric.  Psychiatric:        Behavior: Behavior normal.        Thought Content: Thought content normal.        Judgment: Judgment normal.       BP 123/71   Pulse (!) 115    Temp (!) 97.3 F (36.3 C) (Oral)   Ht 5\' 3"  (1.6 m)   Wt 169 lb (76.7 kg)   BMI 29.94 kg/m      Assessment & Plan:  [name] comes in today with chief complaint of Altered Mental Status   Diagnosis and orders addressed:  1. Confusion Dip negative  Culture pending Force fluids Fall preventions discussed Follow up with PCP if culture is negative - Urinalysis - Urine Culture  2. Hx: UTI (urinary tract infection)   Evelina Dun, FNP

## 2018-10-17 LAB — URINE CULTURE

## 2018-10-19 ENCOUNTER — Ambulatory Visit: Payer: Medicare Other | Admitting: Neurology

## 2018-10-19 ENCOUNTER — Encounter: Payer: Self-pay | Admitting: Neurology

## 2018-10-19 ENCOUNTER — Other Ambulatory Visit: Payer: Self-pay | Admitting: Family

## 2018-10-19 VITALS — BP 147/78 | HR 84 | Ht 62.5 in | Wt 170.0 lb

## 2018-10-19 DIAGNOSIS — R404 Transient alteration of awareness: Secondary | ICD-10-CM | POA: Diagnosis not present

## 2018-10-19 MED ORDER — LEVETIRACETAM 250 MG PO TABS: ORAL_TABLET | ORAL | 3 refills | Status: DC

## 2018-10-19 MED ORDER — CEPHALEXIN 500 MG PO CAPS: 500.0000 mg | ORAL_CAPSULE | Freq: Two times a day (BID) | ORAL | 0 refills | Status: DC

## 2018-10-19 NOTE — Progress Notes (Signed)
Reason for visit: Transient alteration of awareness, possible seizures  Kristy [name] is an 85 y.o. female  History of present illness:  Kristy [name] is an 85 year old right-handed white female with a history of events of transient alteration of awareness.  The patient has had 2 events of staring off, another 2 events of what sounds like aphasia, the patient had no recollection of events following the episodes.  The patient however likely is developing a significant memory issue.  She has severe macular degeneration and impairment of vision.  She is having occasional hallucinations, she is requiring assistance keeping up with medications and appointments, she does not cook.  Many of her losses of actives of daily living are related to her vision, but her family does believe that her memory is failing.  Her husband recently had go in the hospital with the flu and a heart attack, this resulted in severe confusion on the part of the patient.  The patient comes in today with her son.  The patient is quite sleepy on the Keppra, she is wanting to sleep throughout the day.  No further events have been noted while on Keppra.  Past Medical History:  Diagnosis Date  . Anxiety   . Colon polyps   . Complication of anesthesia 01-18-2013   slow to awaken and felt crazy for 3-4 days  . Diabetes mellitus   . Diverticulitis   . H/O hiatal hernia   . Hearing loss 01-07-13   bilateral hearing aids  . History of shingles 01-07-13   3'14 recent outbreak- drying in   . HOH (hard of hearing) 02/05/2018  . Hypercholesteremia   . Hypertension    Dr. Laurance Flatten  . Lumbar spondylosis    osteoarthritis-knees  . Macular degeneration of both eyes    and dry eyes  . Stroke [facility])    TIA    Past Surgical History:  Procedure Laterality Date  . ABDOMINAL HYSTERECTOMY  1987  . APPENDECTOMY     pt thinks this was removed when gall bladder was taken out  . CATARACT EXTRACTION    . CHOLECYSTECTOMY  1989   open  . KNEE  ARTHROSCOPY  01-07-13   left knee- many yrs ago  . Lumbar back surgery  2013  . TOTAL KNEE ARTHROPLASTY Left 01/18/2013   Procedure: TOTAL KNEE ARTHROPLASTY;  Surgeon: Gearlean Alf, MD;  Location: [facility];  Service: Orthopedics;  Laterality: Left;  . TOTAL KNEE ARTHROPLASTY Right 03/07/2014   Procedure: RIGHT TOTAL KNEE ARTHROPLASTY;  Surgeon: Gearlean Alf, MD;  Location: [facility];  Service: Orthopedics;  Laterality: Right;  . VENTRAL HERNIA REPAIR      Family History  Problem Relation Age of Onset  . Stroke Father 56  . Heart disease Father   . Irregular heart beat Father        pacemaker   . Healthy Mother   . Healthy Son   . Heart disease Sister        unknown heart condition   . Bipolar disorder Son   . COPD Son   . Cancer Sister        colon    Social history:  reports that she has never smoked. She has never used smokeless tobacco. She reports that she does not drink alcohol or use drugs.    Allergies  Allergen Reactions  . Vioxx [Rofecoxib] Other (See Comments)    Ulcers in mouth     Medications:  Prior to Admission  medications   Medication Sig Start Date End Date Taking? Authorizing Provider  busPIRone (BUSPAR) 30 MG tablet TAKE 1 TABLET (30 MG TOTAL) BY MOUTH 2 (TWO) TIMES DAILY. 10/07/18  Yes Chipper Herb, MD  cholecalciferol (VITAMIN D) 1000 UNITS tablet Take 1,000 Units by mouth daily.   Yes [provider]  ezetimibe (ZETIA) 10 MG tablet TAKE 1 TABLET BY MOUTH EVERY DAY 08/29/18  Yes Chipper Herb, MD  furosemide (LASIX) 40 MG tablet Take 40 mg by mouth 2 (two) times daily as needed for fluid.    Yes [provider]  glimepiride (AMARYL) 4 MG tablet TAKE 1 TABLET (4 MG TOTAL) BY MOUTH DAILY WITH BREAKFAST. 08/31/18  Yes Chipper Herb, MD  glucose blood (FREESTYLE TEST STRIPS) test strip Check Blood sugar two times daily 08/24/18  Yes Chipper Herb, MD  glucose blood (ONE TOUCH ULTRA TEST) test strip CHECK BLOOD SUGAR TWO TIMES DAILY  08/25/18  Yes Chipper Herb, MD  hydroxypropyl methylcellulose (ISOPTO TEARS) 2.5 % ophthalmic solution Place 1 drop into both eyes 2 (two) times daily as needed (dry eyes).   Yes [provider]  Lancets [facility] ULTRASOFT) lancets Check blood sugars twice a day 09/11/18  Yes Chipper Herb, MD  levETIRAcetam (KEPPRA) 250 MG tablet 1 tablet twice daily for 2 weeks then take 2 tablets twice daily Patient taking differently: Take 500 mg by mouth 2 (two) times daily. 1 tablet twice daily for 2 weeks then take 2 tablets twice daily 07/01/18  Yes Kathrynn Ducking, MD  lisinopril (PRINIVIL,ZESTRIL) 10 MG tablet TAKE 1/2 TABLET DAILY AS DIRECTED 06/22/18  Yes Chipper Herb, MD  LUMIGAN 0.01 % SOLN  11/09/15  Yes [provider]  metFORMIN (GLUCOPHAGE) 1000 MG tablet TAKE 1 TABLET BY MOUTH WITH MEALS BREAKFAST AND SUPPER 08/04/18  Yes Chipper Herb, MD  metoprolol succinate (TOPROL-XL) 25 MG 24 hr tablet TAKE 1 TABLET BY MOUTH EVERY DAY 07/31/18  Yes Chipper Herb, MD  Multiple Vitamins-Minerals (PRESERVISION AREDS 2 PO) Take by mouth.   Yes [provider]  Omega-3 Fatty Acids (FISH OIL) 1000 MG CAPS Take 1 capsule by mouth daily.   Yes [provider]  oseltamivir (TAMIFLU) 75 MG capsule Take 1 capsule (75 mg total) by mouth daily. 10/14/18  Yes Chipper Herb, MD  pravastatin (PRAVACHOL) 80 MG tablet TAKE ONE TABLET BY MOUTH AT BEDTIME 09/24/18  Yes Chipper Herb, MD    ROS:  Out of a complete 14 system review of symptoms, the patient complains only of the following symptoms, and all other reviewed systems are negative.  Loss of vision Vomiting Memory loss, headache Depression, anxiety, hallucinations  Blood pressure (!) 147/78, pulse 84, height 5' 2.5" (1.588 m), weight 170 lb (77.1 kg).  Physical Exam  General: The patient is alert and cooperative at the time of the examination.  Skin: No significant peripheral edema is noted.   Neurologic  Exam  Mental status: The patient is alert and oriented x 3 at the time of the examination.  The Mini-Mental status examination done today shows a total score of 22/30.   Cranial nerves: Facial symmetry is present. Speech is normal, no aphasia or dysarthria is noted. Extraocular movements are full. Visual fields are full.  Motor: The patient has good strength in all 4 extremities.  Sensory examination: Soft touch sensation is symmetric on the face, arms, and legs.  Coordination: The patient has good finger-nose-finger and heel-to-shin  bilaterally.  Gait and station: The patient has a normal gait. Tandem gait was not tested. Romberg is negative. No drift is seen.  Reflexes: Deep tendon reflexes are symmetric.   Assessment/Plan:  1.  Episodes of transient alteration of awareness, possible seizures  2.  Macular degeneration, severe vision impairment  3.  Decreased auditory acuity  4.  Memory disturbance  The patient will need to be followed for the memory disturbance, so far she has done well on the Keppra without recurrent events.  She is having drowsiness on the Keppra, the dose will be reduced to 250 mg in the morning and 500 mg in the evening.  A prescription for the Keppra was sent in.  We will follow-up in about 6 months.  In the future, the patient may require medications for memory.  Jill Alexanders MD 10/19/2018 10:00 AM  [facility] [address] [place], [place] [address]  Phone [phone] Fax [phone]

## 2018-10-19 NOTE — Patient Instructions (Signed)
Reduce the Keppra 250 mg to one in the morning and 2 in the evening

## 2018-10-22 ENCOUNTER — Ambulatory Visit (INDEPENDENT_AMBULATORY_CARE_PROVIDER_SITE_OTHER): Payer: Medicare Other | Admitting: Family Medicine

## 2018-10-22 ENCOUNTER — Encounter: Payer: Self-pay | Admitting: Family Medicine

## 2018-10-22 VITALS — BP 123/69 | HR 71 | Temp 97.0°F | Ht 62.5 in | Wt 170.0 lb

## 2018-10-22 DIAGNOSIS — E559 Vitamin D deficiency, unspecified: Secondary | ICD-10-CM | POA: Diagnosis not present

## 2018-10-22 DIAGNOSIS — E1122 Type 2 diabetes mellitus with diabetic chronic kidney disease: Secondary | ICD-10-CM

## 2018-10-22 DIAGNOSIS — N183 Chronic kidney disease, stage 3 (moderate): Secondary | ICD-10-CM | POA: Diagnosis not present

## 2018-10-22 DIAGNOSIS — C9110 Chronic lymphocytic leukemia of B-cell type not having achieved remission: Secondary | ICD-10-CM | POA: Diagnosis not present

## 2018-10-22 DIAGNOSIS — I10 Essential (primary) hypertension: Secondary | ICD-10-CM | POA: Diagnosis not present

## 2018-10-22 DIAGNOSIS — E0590 Thyrotoxicosis, unspecified without thyrotoxic crisis or storm: Secondary | ICD-10-CM

## 2018-10-22 DIAGNOSIS — E1169 Type 2 diabetes mellitus with other specified complication: Secondary | ICD-10-CM

## 2018-10-22 DIAGNOSIS — G459 Transient cerebral ischemic attack, unspecified: Secondary | ICD-10-CM

## 2018-10-22 DIAGNOSIS — H3530 Unspecified macular degeneration: Secondary | ICD-10-CM

## 2018-10-22 DIAGNOSIS — E785 Hyperlipidemia, unspecified: Secondary | ICD-10-CM

## 2018-10-22 LAB — BAYER DCA HB A1C WAIVED: HB A1C (BAYER DCA - WAIVED): 7 % — ABNORMAL HIGH (ref <7.0)

## 2018-10-22 NOTE — Patient Instructions (Addendum)
Medicare Annual Wellness Visit  [facility] and the medical providers at [facility] strive to bring you the best medical care.  In doing so we not only want to address your current medical conditions and concerns but also to detect new conditions early and prevent illness, disease and health-related problems.    Medicare offers a yearly Wellness Visit which allows our clinical staff to assess your need for preventative services including immunizations, lifestyle education, counseling to decrease risk of preventable diseases and screening for fall risk and other medical concerns.    This visit is provided free of charge (no copay) for all Medicare recipients. The clinical pharmacists at [facility] have begun to conduct these Wellness Visits which will also include a thorough review of all your medications.    As you primary medical provider recommend that you make an appointment for your Annual Wellness Visit if you have not done so already this year.  You may set up this appointment before you leave today or you may call back [phone]) and schedule an appointment.  Please make sure when you call that you mention that you are scheduling your Annual Wellness Visit with the clinical pharmacist so that the appointment may be made for the proper length of time.     Continue current medications. Continue good therapeutic lifestyle changes which include good diet and exercise. Fall precautions discussed with patient. If an FOBT was given today- please return it to our front desk. If you are over 68 years old - you may need Prevnar 81 or the adult Pneumonia vaccine.  **Flu shots are available--- please call and schedule a FLU-CLINIC appointment**  After your visit with [place] today you will receive a survey in the mail or online from [employer] regarding your care with [place]. Please take a moment to fill this out. Your feedback is very  important to [place] as you can help [place] better understand your patient needs as well as improve your experience and satisfaction. WE CARE ABOUT YOU!!!   Continue and finish the antibiotic the patient has been taking for urinary tract infection Recheck a clean-catch midstream specimen when this is completed and get culture and sensitivity Patient should stop taking and drinking any sodas and only drink water at home.  She has been drinking Pepsi and this is not good for her blood sugar. Please make sure that someone at home is fixing the medication for her to take correctly She should continue to drink plenty of water and stay well-hydrated She should follow-up with a neurologist on any future visit scheduled with him.

## 2018-10-22 NOTE — Progress Notes (Signed)
Subjective:    Patient ID: Kristy [name], female    DOB: 1928/06/19, 85 y.o.   MRN: [id number]  HPI Pt here for follow up and management of chronic medical problem which includes hyperlipidemia and diabetes. She is taking medication regularly.  Patient returns today for her regular office visit.  On the 20th of this month she was treated with Keflex for urinary tract infection with Klebsiella pneumonia sensitive to the cephalosporins.  She has been on Keflex 500 twice daily.  This was all secondary to increased confusion.  She has no complaints today.  She is still taking the Keflex.  Patient recently saw Dr. Jannifer Franklin the neurologist and since she has been taking Keppra she has had no more of these spells where she stops talking and seems to be confused and so the neurologist is apparently thinking that this may be some type of seizure disorder causing the spells.  The patient is unable to see and her husband was in the hospital recently and I am sure she is had increased confusion because of him not being present.  He is back at home and hopefully things are better.  Patient comes to the visit today with her husband and her niece.  The note from the neurologist that she recently saw was reviewed during the visit.  Patient has a history of diabetes hyperlipidemia hyper tension and hypothyroidism.  He is visually impaired and is having more issues with her memory.  The patient denies any chest pain pressure tightness shortness of breath trouble swallowing heartburn indigestion nausea vomiting diarrhea blood in the stool black tarry bowel movements or change in bowel habits.  She is passing her water without problems.  She answered questions appropriately when asked and said that she could hear well and though she cannot see well.   Patient Active Problem List   Diagnosis Date Noted  . HOH (hard of hearing) 02/05/2018  . Incisional hernia, without obstruction or gangrene 05/27/2017  . Type 2 diabetes  mellitus with stage 3 chronic kidney disease ([place]) 07/07/2015  . Type 2 diabetes mellitus with chronic kidney disease and hypertension ([place]) 07/07/2015  . SVT (supraventricular tachycardia) ([place]) 02/08/2015  . Depression 08/24/2013  . Essential hypertension, benign 06/08/2013  . Hyperlipidemia associated with type 2 diabetes mellitus ([place]) 06/08/2013  . Vitamin D deficiency 06/08/2013  . Diabetes mellitus type 2 controlled 06/08/2013  . OA (osteoarthritis) of knee 01/18/2013  . CLL (chronic lymphocytic leukemia) ([place]) 06/17/2012   Outpatient Encounter Medications as of 10/22/2018  Medication Sig  . busPIRone (BUSPAR) 30 MG tablet TAKE 1 TABLET (30 MG TOTAL) BY MOUTH 2 (TWO) TIMES DAILY.  . cephALEXin (KEFLEX) 500 MG capsule Take 1 capsule (500 mg total) by mouth 2 (two) times daily.  . cholecalciferol (VITAMIN D) 1000 UNITS tablet Take 1,000 Units by mouth daily.  Marland Kitchen ezetimibe (ZETIA) 10 MG tablet TAKE 1 TABLET BY MOUTH EVERY DAY  . furosemide (LASIX) 40 MG tablet Take 40 mg by mouth 2 (two) times daily as needed for fluid.   Marland Kitchen glimepiride (AMARYL) 4 MG tablet TAKE 1 TABLET (4 MG TOTAL) BY MOUTH DAILY WITH BREAKFAST.  Marland Kitchen glucose blood (FREESTYLE TEST STRIPS) test strip Check Blood sugar two times daily  . glucose blood (ONE TOUCH ULTRA TEST) test strip CHECK BLOOD SUGAR TWO TIMES DAILY  . hydroxypropyl methylcellulose (ISOPTO TEARS) 2.5 % ophthalmic solution Place 1 drop into both eyes 2 (two) times daily as needed (dry eyes).  . Lancets [facility]  ULTRASOFT) lancets Check blood sugars twice a day  . levETIRAcetam (KEPPRA) 250 MG tablet 1 tablet in the morning and 2 in the evening  . lisinopril (PRINIVIL,ZESTRIL) 10 MG tablet TAKE 1/2 TABLET DAILY AS DIRECTED  . LUMIGAN 0.01 % SOLN   . metFORMIN (GLUCOPHAGE) 1000 MG tablet TAKE 1 TABLET BY MOUTH WITH MEALS BREAKFAST AND SUPPER  . metoprolol succinate (TOPROL-XL) 25 MG 24 hr tablet TAKE 1 TABLET BY MOUTH EVERY DAY  . Multiple Vitamins-Minerals  (PRESERVISION AREDS 2 PO) Take by mouth.  . Omega-3 Fatty Acids (FISH OIL) 1000 MG CAPS Take 1 capsule by mouth daily.  . pravastatin (PRAVACHOL) 80 MG tablet TAKE ONE TABLET BY MOUTH AT BEDTIME  . [DISCONTINUED] oseltamivir (TAMIFLU) 75 MG capsule Take 1 capsule (75 mg total) by mouth daily.   No facility-administered encounter medications on file as of 10/22/2018.      Review of Systems  Constitutional: Negative.   HENT: Negative.   Eyes: Negative.   Respiratory: Negative.   Cardiovascular: Negative.   Gastrointestinal: Negative.   Endocrine: Negative.   Genitourinary: Negative.   Musculoskeletal: Negative.   Skin: Negative.   Allergic/Immunologic: Negative.   Neurological: Negative.   Hematological: Negative.   Psychiatric/Behavioral: Negative.        Objective:   Physical Exam Vitals signs and nursing note reviewed.  Constitutional:      General: She is not in acute distress.    Appearance: Normal appearance. She is well-developed. She is obese. She is not ill-appearing.  HENT:     Head: Normocephalic and atraumatic.     Right Ear: Tympanic membrane, ear canal and external ear normal. There is no impacted cerumen.     Left Ear: Tympanic membrane, ear canal and external ear normal. There is no impacted cerumen.     Ears:     Comments: Bilateral hearing aids ear canals were clear with no wax    Nose: Nose normal. No congestion or rhinorrhea.     Mouth/Throat:     Mouth: Mucous membranes are moist.     Pharynx: Oropharynx is clear. No oropharyngeal exudate.  Eyes:     General: No scleral icterus.       Right eye: No discharge.        Left eye: No discharge.     Extraocular Movements: Extraocular movements intact.     Conjunctiva/sclera: Conjunctivae normal.     Pupils: Pupils are equal, round, and reactive to light.  Neck:     Musculoskeletal: Normal range of motion and neck supple.     Thyroid: No thyromegaly.     Vascular: No carotid bruit or JVD.     Comments:  No bruits thyromegaly or anterior cervical adenopathy Cardiovascular:     Rate and Rhythm: Normal rate and regular rhythm.     Pulses: Normal pulses.     Heart sounds: Normal heart sounds. No murmur. No friction rub.     Comments: Heart is regular at 72/min with no edema and good pedal pulses Pulmonary:     Effort: Pulmonary effort is normal.     Breath sounds: Normal breath sounds. No wheezing or rales.     Comments: Lungs are clear anteriorly and posteriorly Abdominal:     General: Abdomen is flat. Bowel sounds are normal.     Palpations: Abdomen is soft. There is no mass.     Tenderness: There is no abdominal tenderness.     Comments: No abdominal tenderness masses organ enlargement or bruits  Musculoskeletal: Normal range of motion.        General: No tenderness.     Right lower leg: No edema.     Left lower leg: No edema.  Lymphadenopathy:     Cervical: No cervical adenopathy.  Skin:    General: Skin is warm and dry.  Neurological:     General: No focal deficit present.     Mental Status: She is alert and oriented to person, place, and time. Mental status is at baseline.     Cranial Nerves: No cranial nerve deficit.     Deep Tendon Reflexes: Reflexes are normal and symmetric.     Comments: Patient is visually impaired and has memory impairment though she responded appropriately to everything asked of her today and says she heard everything that was said.  Psychiatric:        Mood and Affect: Mood normal.        Behavior: Behavior normal.        Thought Content: Thought content normal.        Judgment: Judgment normal.     Comments: Mood affect and behavior were normal for patient.    BP 123/69 (BP Location: Left Arm)   Pulse 71   Temp (!) 97 F (36.1 C) (Oral)   Ht 5' 2.5" (1.588 m)   Wt 170 lb (77.1 kg)   BMI 30.60 kg/m         Assessment & Plan:  1. Type 2 diabetes mellitus with stage 3 chronic kidney disease, without long-term current use of insulin  ([place]) -Continue current treatment pending results of lab work and drink no soda drinks only drink water - BMP8+EGFR - Bayer DCA Hb A1c Waived  2. CLL (chronic lymphocytic leukemia) ([facility]) -We will continue to monitor this.  Patient has seen hematologist in the past and does not want to see her back unless her white count gets extremely elevated. - CBC with Differential/Platelet  3. Vitamin D deficiency -Continue with vitamin D replacement - VITAMIN D 25 Hydroxy (Vit-D Deficiency, Fractures)  4. Essential hypertension, benign -Continue with current blood pressure treatment  - BMP8+EGFR - Hepatic function panel  5. Hyperthyroidism -Continue with thyroid monitoring  6. Hyperlipidemia associated with type 2 diabetes mellitus ([place]) -Continue with pravastatin and as aggressive therapeutic lifestyle changes as possible - Lipid panel  7. Macular degeneration of both eyes, unspecified type -Continue to be careful and monitor her movement so that she is not apt to trip and fall over something she cannot see  8. TIA (transient ischemic attack) -The spells that she has been having may actually be some type of seizure disorder according to the note from the neurologist and she is currently taking Keppra at a lower dose for this.  She should follow-up with neurology as planned  Patient Instructions                       Medicare Annual Wellness Visit  [facility] and the medical providers at [facility] strive to bring you the best medical care.  In doing so we not only want to address your current medical conditions and concerns but also to detect new conditions early and prevent illness, disease and health-related problems.    Medicare offers a yearly Wellness Visit which allows our clinical staff to assess your need for preventative services including immunizations, lifestyle education, counseling to decrease risk of preventable diseases and screening for fall risk and  other  medical concerns.    This visit is provided free of charge (no copay) for all Medicare recipients. The clinical pharmacists at [facility] have begun to conduct these Wellness Visits which will also include a thorough review of all your medications.    As you primary medical provider recommend that you make an appointment for your Annual Wellness Visit if you have not done so already this year.  You may set up this appointment before you leave today or you may call back ([phone]) and schedule an appointment.  Please make sure when you call that you mention that you are scheduling your Annual Wellness Visit with the clinical pharmacist so that the appointment may be made for the proper length of time.     Continue current medications. Continue good therapeutic lifestyle changes which include good diet and exercise. Fall precautions discussed with patient. If an FOBT was given today- please return it to our front desk. If you are over 21 years old - you may need Prevnar 73 or the adult Pneumonia vaccine.  **Flu shots are available--- please call and schedule a FLU-CLINIC appointment**  After your visit with [place] today you will receive a survey in the mail or online from [employer] regarding your care with [place]. Please take a moment to fill this out. Your feedback is very important to [place] as you can help [place] better understand your patient needs as well as improve your experience and satisfaction. WE CARE ABOUT YOU!!!   Continue and finish the antibiotic the patient has been taking for urinary tract infection Recheck a clean-catch midstream specimen when this is completed and get culture and sensitivity Patient should stop taking and drinking any sodas and only drink water at home.  She has been drinking Pepsi and this is not good for her blood sugar. Please make sure that someone at home is fixing the medication for her to take correctly She should continue to drink plenty  of water and stay well-hydrated She should follow-up with a neurologist on any future visit scheduled with him.  Arrie Senate MD

## 2018-10-23 LAB — CBC WITH DIFFERENTIAL/PLATELET
BASOS ABS: 0.1 10*3/uL (ref 0.0–0.2)
Basos: 0 %
EOS (ABSOLUTE): 0.1 10*3/uL (ref 0.0–0.4)
EOS: 0 %
Hematocrit: 39 % (ref 34.0–46.6)
Hemoglobin: 13.1 g/dL (ref 11.1–15.9)
IMMATURE GRANS (ABS): 0 10*3/uL (ref 0.0–0.1)
IMMATURE GRANULOCYTES: 0 %
LYMPHS ABS: 10.3 10*3/uL — AB (ref 0.7–3.1)
LYMPHS: 58 %
MCH: 30.2 pg (ref 26.6–33.0)
MCHC: 33.6 g/dL (ref 31.5–35.7)
MCV: 90 fL (ref 79–97)
MONOCYTES: 5 %
Monocytes Absolute: 0.9 10*3/uL (ref 0.1–0.9)
NEUTROS PCT: 37 %
Neutrophils Absolute: 6.6 10*3/uL (ref 1.4–7.0)
Platelets: 219 10*3/uL (ref 150–450)
RBC: 4.34 x10E6/uL (ref 3.77–5.28)
RDW: 13 % (ref 11.7–15.4)
WBC: 18 10*3/uL — AB (ref 3.4–10.8)

## 2018-10-23 LAB — BMP8+EGFR
BUN/Creatinine Ratio: 11 — ABNORMAL LOW (ref 12–28)
BUN: 14 mg/dL (ref 8–27)
CALCIUM: 9.5 mg/dL (ref 8.7–10.3)
CHLORIDE: 106 mmol/L (ref 96–106)
CO2: 19 mmol/L — AB (ref 20–29)
CREATININE: 1.27 mg/dL — AB (ref 0.57–1.00)
GFR calc Af Amer: 44 mL/min/{1.73_m2} — ABNORMAL LOW (ref >59)
GFR calc non Af Amer: 38 mL/min/{1.73_m2} — ABNORMAL LOW (ref >59)
GLUCOSE: 134 mg/dL — AB (ref 65–99)
Potassium: 4.2 mmol/L (ref 3.5–5.2)
Sodium: 145 mmol/L — ABNORMAL HIGH (ref 134–144)

## 2018-10-23 LAB — LIPID PANEL
Chol/HDL Ratio: 2.4 ratio (ref 0.0–4.4)
Cholesterol, Total: 127 mg/dL (ref 100–199)
HDL: 52 mg/dL (ref >39)
LDL CALC: 34 mg/dL (ref 0–99)
Triglycerides: 206 mg/dL — ABNORMAL HIGH (ref 0–149)
VLDL Cholesterol Cal: 41 mg/dL — ABNORMAL HIGH (ref 5–40)

## 2018-10-23 LAB — HEPATIC FUNCTION PANEL
ALBUMIN: 4.2 g/dL (ref 3.6–4.6)
ALT: 17 IU/L (ref 0–32)
AST: 15 IU/L (ref 0–40)
Alkaline Phosphatase: 50 IU/L (ref 39–117)
Bilirubin Total: 0.3 mg/dL (ref 0.0–1.2)
Bilirubin, Direct: 0.12 mg/dL (ref 0.00–0.40)
Total Protein: 6.1 g/dL (ref 6.0–8.5)

## 2018-10-23 LAB — VITAMIN D 25 HYDROXY (VIT D DEFICIENCY, FRACTURES): Vit D, 25-Hydroxy: 30.6 ng/mL (ref 30.0–100.0)

## 2018-11-03 ENCOUNTER — Other Ambulatory Visit: Payer: Self-pay | Admitting: Family Medicine

## 2018-11-04 ENCOUNTER — Other Ambulatory Visit: Payer: Self-pay | Admitting: Family Medicine

## 2018-11-08 ENCOUNTER — Other Ambulatory Visit: Payer: Self-pay | Admitting: Family Medicine

## 2018-11-08 ENCOUNTER — Other Ambulatory Visit: Payer: Self-pay | Admitting: Neurology

## 2018-11-16 ENCOUNTER — Other Ambulatory Visit: Payer: Self-pay

## 2018-11-16 ENCOUNTER — Telehealth: Payer: Self-pay | Admitting: Neurology

## 2018-11-16 MED ORDER — DIVALPROEX SODIUM 250 MG PO DR TAB: DELAYED_RELEASE_TABLET | ORAL | 2 refills | Status: DC

## 2018-11-16 NOTE — Telephone Encounter (Signed)
The patient has had some paranoid behavior on the Keppra, they are to taper off of the medication by 1 tablet every week until off the drug.  We will start Depakote 2 and 50 mg, 1 in the morning and 2 in the evening.

## 2018-11-16 NOTE — Telephone Encounter (Signed)
Pt's son [name]/DPR said pt had a rough night. She thought children were behind the headboard all night playing, while husband was fixing breakfast she snuck out of the house. He found her lying on the ground, she thinks husband is trying to poison her so she won't eat. This has happened since a change in dosing of levETIRAcetam (KEPPRA) 250 MG tablet. Please call to advise

## 2018-11-17 NOTE — Telephone Encounter (Signed)
I called and talk with the daughter.  The Keppra can cause increased anxiety and may even result in psychosis.  The patient likely is having hallucinations and sundowning primarily secondary to her dementia.  The Keppra however may be worsening agitation associated with this, switching over to Depakote may still offer benefit as a seizure medication but also have some effect as a mood stabilizer to help calm the patient down.  It is very likely however even on Depakote that the patient will still have some sundowning issues, hopefully the agitation will be somewhat lessened on the Depakote.

## 2018-11-17 NOTE — Telephone Encounter (Addendum)
Pt's daughter-in-law/DPR is wanting to know if the patient has been on keppra for several months and then have a sudden onset of hallucinations. Could this be hallucinations or dementia? Please call to advise at [phone]

## 2018-12-06 ENCOUNTER — Other Ambulatory Visit: Payer: Self-pay | Admitting: Family Medicine

## 2018-12-07 ENCOUNTER — Other Ambulatory Visit: Payer: Self-pay | Admitting: Family Medicine

## 2018-12-17 ENCOUNTER — Other Ambulatory Visit: Payer: Self-pay | Admitting: Family Medicine

## 2018-12-21 ENCOUNTER — Other Ambulatory Visit: Payer: Self-pay | Admitting: Family Medicine

## 2019-01-04 ENCOUNTER — Encounter: Payer: Medicare Other | Admitting: *Deleted

## 2019-01-25 ENCOUNTER — Telehealth: Payer: Self-pay | Admitting: Neurology

## 2019-01-25 NOTE — Telephone Encounter (Signed)
I called and talk with the daughter.  The patient has had 2 weeks of problems with not recognizing her husband, she does have visual impairment, but she seems to be thinking that her husband is her father.  She has improved but there agitation coming off of Keppra.  We will try reducing and getting off of Depakote, she will come off but 250 mg every 5 days until off the drug.  The behavior described above very well could be related to dementia alone.

## 2019-01-25 NOTE — Telephone Encounter (Signed)
Pts daughter in law (on [place]) states pt is getting confused between her husband and her dad who died 42yrs ago and they would like to know if this is a side effect of the medication.  She would also like to know if the pt really need's to take the seizure medication that was prescribed to her and also would like the list of medications to be looked over to see if she really needs to be on all the medications that is on her list either from the PCP or with our provider. Please advise.

## 2019-01-28 ENCOUNTER — Other Ambulatory Visit: Payer: Self-pay

## 2019-02-01 ENCOUNTER — Encounter: Payer: Medicare Other | Admitting: *Deleted

## 2019-02-11 ENCOUNTER — Other Ambulatory Visit: Payer: Self-pay | Admitting: Neurology

## 2019-02-17 ENCOUNTER — Other Ambulatory Visit: Payer: Self-pay | Admitting: Family Medicine

## 2019-02-26 ENCOUNTER — Other Ambulatory Visit: Payer: Self-pay

## 2019-02-26 ENCOUNTER — Ambulatory Visit (INDEPENDENT_AMBULATORY_CARE_PROVIDER_SITE_OTHER): Payer: Medicare Other | Admitting: Family Medicine

## 2019-02-26 ENCOUNTER — Encounter: Payer: Self-pay | Admitting: Family Medicine

## 2019-02-26 ENCOUNTER — Encounter: Payer: Self-pay | Admitting: *Deleted

## 2019-02-26 DIAGNOSIS — R531 Weakness: Secondary | ICD-10-CM

## 2019-02-26 DIAGNOSIS — H3530 Unspecified macular degeneration: Secondary | ICD-10-CM

## 2019-02-26 DIAGNOSIS — W19XXXA Unspecified fall, initial encounter: Secondary | ICD-10-CM

## 2019-02-26 DIAGNOSIS — R410 Disorientation, unspecified: Secondary | ICD-10-CM

## 2019-02-26 DIAGNOSIS — E0590 Thyrotoxicosis, unspecified without thyrotoxic crisis or storm: Secondary | ICD-10-CM

## 2019-02-26 DIAGNOSIS — E1122 Type 2 diabetes mellitus with diabetic chronic kidney disease: Secondary | ICD-10-CM

## 2019-02-26 DIAGNOSIS — R0781 Pleurodynia: Secondary | ICD-10-CM

## 2019-02-26 DIAGNOSIS — G459 Transient cerebral ischemic attack, unspecified: Secondary | ICD-10-CM

## 2019-02-26 DIAGNOSIS — E1169 Type 2 diabetes mellitus with other specified complication: Secondary | ICD-10-CM

## 2019-02-26 DIAGNOSIS — C9110 Chronic lymphocytic leukemia of B-cell type not having achieved remission: Secondary | ICD-10-CM

## 2019-02-26 DIAGNOSIS — I10 Essential (primary) hypertension: Secondary | ICD-10-CM

## 2019-02-26 DIAGNOSIS — R2681 Unsteadiness on feet: Secondary | ICD-10-CM

## 2019-02-26 DIAGNOSIS — R413 Other amnesia: Secondary | ICD-10-CM

## 2019-02-26 DIAGNOSIS — N183 Chronic kidney disease, stage 3 (moderate): Secondary | ICD-10-CM

## 2019-02-26 DIAGNOSIS — E785 Hyperlipidemia, unspecified: Secondary | ICD-10-CM

## 2019-02-26 DIAGNOSIS — K439 Ventral hernia without obstruction or gangrene: Secondary | ICD-10-CM

## 2019-02-26 DIAGNOSIS — R195 Other fecal abnormalities: Secondary | ICD-10-CM

## 2019-02-26 DIAGNOSIS — E559 Vitamin D deficiency, unspecified: Secondary | ICD-10-CM | POA: Diagnosis not present

## 2019-02-26 NOTE — Patient Instructions (Addendum)
Continue to check blood sugars regularly Continue to be careful and not put self at risk for falling Have regular habits every day Make sure you take your medicine correctly Continue to practice good respiratory and hand hygiene We will contact [facility] and have someone come in and assess the situation of the patient and her spouse to see if some additional care can be provided to help support them so she can stay at home. We will also plan to change her metformin to extended release to see if this could help the loose bowel movement situation and reduce her caffeine intake.

## 2019-02-26 NOTE — Addendum Note (Signed)
Addended by: Zannie Cove on: 02/26/2019 03:27 PM   Modules accepted: Orders

## 2019-02-26 NOTE — Progress Notes (Signed)
Virtual Visit Via telephone Note I connected with@ on 02/26/19 by telephone and verified that I am speaking with the correct person or authorized healthcare agent using two identifiers. Kristy [name] is currently located at home and there are no unauthorized people in close proximity. I completed this visit while in a private location in my home .  This visit type was conducted due to national recommendations for restrictions regarding the COVID-19 Pandemic (e.g. social distancing).  This format is felt to be most appropriate for this patient at this time.  All issues noted in this document were discussed and addressed.  No physical exam was performed.    I discussed the limitations, risks, security and privacy concerns of performing an evaluation and management service by telephone and the availability of in person appointments. I also discussed with the patient that there may be a patient responsible charge related to this service. The patient expressed understanding and agreed to proceed.   Date:  02/26/2019    ID:  Kristy [name]      October 03, 1927        [id number]   Patient Care Team Patient Care Team: Chipper Herb, MD as PCP - General (Family Medicine) Gaynelle Arabian, MD as Consulting Physician (Orthopedic Surgery) Minus Breeding, MD as Consulting Physician (Cardiology) Irine Seal, MD as Attending Physician (Urology) Sable Feil, MD as Consulting Physician (Gastroenterology) Bond, Tracie Harrier, MD as Referring Physician (Ophthalmology)  Reason for Visit: Primary Care Follow-up     History of Present Illness & Review of Systems:     Kristy [name] is a 85 y.o. year old female primary care patient that presents today for a telehealth visit.  The patient is having memory problems.  Is beginning to put a lot of stress on her husband and her family.  She is also having some fecal incontinence issues.  She cannot see because of macular degeneration.  There is nothing  else that can be done about her eyes.  When you talk to the patient she is very kind and come with speaking and always and usually never complains of anything.  She is definitely having ongoing and worsening memory issues.  There is not been any history of any chest pain other than some left rib pain from a recent fall about a week ago.  She said that her feet got tangled in something and made her fall.  She also hit her head.  There is no shortness of breath other than the shortness of breath associated with the rib pain.  She denies any problems with her stomach other than loose uncontrolled bowel movements.  She is on metformin 1000 mg twice daily so we will make some changes to the extended release and asked him to cut out the caffeine.  She is passing her water without problems.  She sees the eye doctor regularly but they have told him there is nothing they can do because of her macular degeneration.  She also is keeping appointments with Dr. Jannifer Franklin because of some type of absence seizures that she is having he basically has stopped any kind of medicine like BuSpar or Xanax that she has been taking in the past and I have gotten her off of that completely and in fact the son thinks maybe she might be just a little bit better getting off of that medicine.  Review of systems as stated, otherwise negative.  The patient does not have symptoms concerning for  COVID-19 infection (fever, chills, cough, or new shortness of breath).      Current Medications (Verified) Allergies as of 02/26/2019      Reactions   Vioxx [rofecoxib] Other (See Comments)   Ulcers in mouth      Medication List       Accurate as of Feb 26, 2019  2:05 PM. If you have any questions, ask your nurse or doctor.        busPIRone 30 MG tablet Commonly known as:  BUSPAR TAKE 1 TABLET BY MOUTH TWICE A DAY   cephALEXin 500 MG capsule Commonly known as:  KEFLEX Take 1 capsule (500 mg total) by mouth 2 (two) times daily.    cholecalciferol 1000 units tablet Commonly known as:  VITAMIN D Take 1,000 Units by mouth daily.   divalproex 250 MG DR tablet Commonly known as:  DEPAKOTE TAKE 1 TABLET BY MOUTH IN THE MORNING AND 2 IN THE EVENING   ezetimibe 10 MG tablet Commonly known as:  ZETIA TAKE 1 TABLET BY MOUTH EVERY DAY   Fish Oil 1000 MG Caps Take 1 capsule by mouth daily.   furosemide 40 MG tablet Commonly known as:  LASIX Take 40 mg by mouth 2 (two) times daily as needed for fluid.   glimepiride 4 MG tablet Commonly known as:  AMARYL TAKE 1 TABLET (4 MG TOTAL) BY MOUTH DAILY WITH BREAKFAST.   glucose blood test strip Commonly known as:  FREESTYLE TEST STRIPS Check Blood sugar two times daily   glucose blood test strip Commonly known as:  ONE TOUCH ULTRA TEST CHECK BLOOD SUGAR TWO TIMES DAILY   hydroxypropyl methylcellulose / hypromellose 2.5 % ophthalmic solution Commonly known as:  ISOPTO TEARS / GONIOVISC Place 1 drop into both eyes 2 (two) times daily as needed (dry eyes).   lisinopril 10 MG tablet Commonly known as:  ZESTRIL TAKE 1/2 TABLET DAILY AS DIRECTED   Lumigan 0.01 % Soln Generic drug:  bimatoprost   metFORMIN 1000 MG tablet Commonly known as:  GLUCOPHAGE TAKE 1 TABLET BY MOUTH WITH MEALS BREAKFAST AND SUPPER   metoprolol succinate 25 MG 24 hr tablet Commonly known as:  TOPROL-XL TAKE 1 TABLET BY MOUTH EVERY DAY   onetouch ultrasoft lancets Check blood sugars twice a day   pravastatin 80 MG tablet Commonly known as:  PRAVACHOL TAKE 1 TABLET BY MOUTH EVERYDAY AT BEDTIME   PRESERVISION AREDS 2 PO Take by mouth.           Allergies (Verified)    Vioxx [rofecoxib]  Past Medical History Past Medical History:  Diagnosis Date  . Anxiety   . Colon polyps   . Complication of anesthesia 01-18-2013   slow to awaken and felt crazy for 3-4 days  . Diabetes mellitus   . Diverticulitis   . H/O hiatal hernia   . Hearing loss 01-07-13   bilateral hearing aids  .  History of shingles 01-07-13   3'14 recent outbreak- drying in   . HOH (hard of hearing) 02/05/2018  . Hypercholesteremia   . Hypertension    Dr. Laurance Flatten  . Lumbar spondylosis    osteoarthritis-knees  . Macular degeneration of both eyes    and dry eyes  . Stroke [facility])    TIA     Past Surgical History:  Procedure Laterality Date  . ABDOMINAL HYSTERECTOMY  1987  . APPENDECTOMY     pt thinks this was removed when gall bladder was taken out  . CATARACT EXTRACTION    .  CHOLECYSTECTOMY  1989   open  . KNEE ARTHROSCOPY  01-07-13   left knee- many yrs ago  . Lumbar back surgery  2013  . TOTAL KNEE ARTHROPLASTY Left 01/18/2013   Procedure: TOTAL KNEE ARTHROPLASTY;  Surgeon: Gearlean Alf, MD;  Location: [facility];  Service: Orthopedics;  Laterality: Left;  . TOTAL KNEE ARTHROPLASTY Right 03/07/2014   Procedure: RIGHT TOTAL KNEE ARTHROPLASTY;  Surgeon: Gearlean Alf, MD;  Location: [facility];  Service: Orthopedics;  Laterality: Right;  . VENTRAL HERNIA REPAIR      Social History   Socioeconomic History  . Marital status: Married    Spouse name: [name]   . Number of children: 2  . Years of education: Not on file  . Highest education level: Not on file  Occupational History  . Occupation: retired    Comment: Teaching laboratory technician  . Financial resource strain: Not on file  . Food insecurity:    Worry: Not on file    Inability: Not on file  . Transportation needs:    Medical: Not on file    Non-medical: Not on file  Tobacco Use  . Smoking status: Never Smoker  . Smokeless tobacco: Never Used  Substance and Sexual Activity  . Alcohol use: No    Alcohol/week: 0.0 standard drinks  . Drug use: No  . Sexual activity: Not Currently  Lifestyle  . Physical activity:    Days per week: Not on file    Minutes per session: Not on file  . Stress: Not on file  Relationships  . Social connections:    Talks on phone: Not on file    Gets together: Not on file    Attends religious service:  Not on file    Active member of club or organization: Not on file    Attends meetings of clubs or organizations: Not on file    Relationship status: Not on file  Other Topics Concern  . Not on file  Social History Narrative   Lives with husband     Family History  Problem Relation Age of Onset  . Stroke Father 49  . Heart disease Father   . Irregular heart beat Father        pacemaker   . Healthy Mother   . Healthy Son   . Heart disease Sister        unknown heart condition   . Bipolar disorder Son   . COPD Son   . Cancer Sister        colon      Labs/Other Tests and Data Reviewed:    Wt Readings from Last 3 Encounters:  10/22/18 170 lb (77.1 kg)  10/19/18 170 lb (77.1 kg)  10/15/18 169 lb (76.7 kg)   Temp Readings from Last 3 Encounters:  10/22/18 (!) 97 F (36.1 C) (Oral)  10/15/18 (!) 97.3 F (36.3 C) (Oral)  06/18/18 (!) 97.3 F (36.3 C) (Oral)   BP Readings from Last 3 Encounters:  10/22/18 123/69  10/19/18 (!) 147/78  10/15/18 123/71   Pulse Readings from Last 3 Encounters:  10/22/18 71  10/19/18 84  10/15/18 (!) 115     Lab Results  Component Value Date   HGBA1C 7.0 (H) 10/22/2018   HGBA1C 7.6 (H) 06/18/2018   HGBA1C 6.9 02/12/2018   Lab Results  Component Value Date   MICROALBUR neg 07/07/2015   LDLCALC 34 10/22/2018   CREATININE 1.27 (H) 10/22/2018  Chemistry      Component Value Date/Time   NA 145 (H) 10/22/2018 1454   NA 141 11/16/2014 0852   K 4.2 10/22/2018 1454   K 4.5 11/16/2014 0852   CL 106 10/22/2018 1454   CL 105 06/17/2012 0845   CO2 19 (L) 10/22/2018 1454   CO2 24 11/16/2014 0852   BUN 14 10/22/2018 1454   BUN 16.6 11/16/2014 0852   CREATININE 1.27 (H) 10/22/2018 1454   CREATININE 1.1 11/16/2014 0852      Component Value Date/Time   CALCIUM 9.5 10/22/2018 1454   CALCIUM 9.5 11/16/2014 0852   ALKPHOS 50 10/22/2018 1454   ALKPHOS 36 (L) 11/16/2014 0852   AST 15 10/22/2018 1454   AST 11 11/16/2014 0852    ALT 17 10/22/2018 1454   ALT 9 11/16/2014 0852   BILITOT 0.3 10/22/2018 1454   BILITOT 0.49 11/16/2014 0852         OBSERVATIONS/ OBJECTIVE:     I did speak to the patient briefly but mostly to her son.  She was pleasant and had no specific complaints but I did discuss with her the findings that were discussed with the son.  I do not have any weights to report nor do that have any fasting blood sugars or 4-hour after eating blood sugars.  She has no vision she is essentially blind.  I said that her feet are good and there is no sign of any soreness redness or infection on her feet.  Physical exam deferred due to nature of telephonic visit.  ASSESSMENT & PLAN    Time:   Today, I have spent 35 minutes with the patient via telephone discussing the above including Covid precautions.     Visit Diagnoses: 1. Type 2 diabetes mellitus with stage 3 chronic kidney disease, without long-term current use of insulin (HCC) -The family is encouraged to check blood sugar readings more frequently at least every day check a fasting blood sugar and periodically check a blood sugar prior to the next meal.  They said that we will do this.  2. Vitamin D deficiency -Vitamin D level be done pending results of lab work  3. Hyperthyroidism -Thyroid profile pending results of lab work  4. Macular degeneration of both eyes, unspecified type -Follow-up with ophthalmology as planned  5. Confusion -This has been worsening.  Appears to be more short-term memory issues.  She did have a fall recently and hit her left side in the back of her head and as far as the family can tell she is recovered from that other than continue to have some left side pain.  This was about a week ago.  6. Ventral hernia without obstruction or gangrene -No complaints with abdominal pain today.  7. CLL (chronic lymphocytic leukemia) ([place]) -Patient has had a persistently elevated white blood cell count and this is felt to be chronic  lymphocytic leukemia and has seen a hematologist in the past.  8. Essential hypertension, benign -No blood pressure readings to report today.  When she comes to the office to get blood work we will try to get the nurse to check her blood pressure also.  9. Hyperlipidemia associated with type 2 diabetes mellitus ([place]) -Continue with statin therapy and as aggressive therapeutic lifestyle changes as possible for her current physical condition  10. TIA (transient ischemic attack) -Continue follow-up with neurologist with next visit being planned in October.  11.  Chest wall contusion secondary to a fall about a week  ago -When patient comes to the office for blood work we will also plan to get a chest x-ray and rib detail.  12.  Memory impairment -This seems to be getting more progressive.  And patient is in denial about things that have happened just recently.  Apparently there is not a lot of combative behavior.  We would encourage her to follow-up with a neurologist and because of the age of both she and her husband will see about arranging for TSH and to come in and do an assessment and see if they can find someone that can help the husband during the week take better care of her and so that he will not be so stressed and maybe even something to provide physical therapy and gait strengthening for her to  Patient Instructions  Continue to check blood sugars regularly Continue to be careful and not put self at risk for falling Have regular habits every day Make sure you take your medicine correctly Continue to practice good respiratory and hand hygiene We will contact [facility] and have someone come in and assess the situation of the patient and her spouse to see if some additional care can be provided to help support them so she can stay at home. We will also plan to change her metformin to extended release to see if this could help the loose bowel movement situation and reduce her caffeine intake.      The above assessment and management plan was discussed with the patient. The patient verbalized understanding of and has agreed to the management plan. Patient is aware to call the clinic if symptoms persist or worsen. Patient is aware when to return to the clinic for a follow-up visit. Patient educated on when it is appropriate to go to the emergency department.    Chipper Herb, MD [facility] [address], [place], [place] [address] Ph [phone]   Arrie Senate MD

## 2019-02-26 NOTE — Progress Notes (Signed)
Cyril Mourning,   Dr Laurance Flatten done a televisit with Imelda and her husband, [name] today. He also spoke with their son. His wife is in healthcare and asked if we could get them some help - maybe from [facility], maybe some PT. Neomi is declining some and it is becoming hard for [name] to do the care himself.   Please evaluate her situation and let me know what you think about this. If there is any orders you need, Dr Laurance Flatten and I will be glad to help!

## 2019-02-26 NOTE — Progress Notes (Signed)
[name],   Ms Paschen sounds like a great candidate for CCM. Since she has [employer], she won't owe anything for that service. That way I can work with them to see what options are available. Since she's had a change in physical/mental status she should qualify for home health PT.  I know that any In-Home-Care beyond [place] services would have to be private pay unless she could qualify for Medicaid. The average rate now for sitters starts at $10/hr and hands-on-care is aorund $20/hr. [facility] doesn't offer any in home nursing services that would be as frequent and consistent as Ms Abbruzzese would require.   If you can place a CCM and Home Health Pt referral that will be a great start. I can give them a call and see what type of help in the home would be most helpful and we can go from there.   Thanks! Cyril Mourning

## 2019-03-02 NOTE — Progress Notes (Signed)
Referrals placed 

## 2019-03-02 NOTE — Addendum Note (Signed)
Addended by: Zannie Cove on: 03/02/2019 10:48 AM   Modules accepted: Orders

## 2019-03-03 ENCOUNTER — Ambulatory Visit: Payer: Medicare Other | Admitting: *Deleted

## 2019-03-03 ENCOUNTER — Ambulatory Visit (INDEPENDENT_AMBULATORY_CARE_PROVIDER_SITE_OTHER): Payer: Medicare Other

## 2019-03-03 ENCOUNTER — Telehealth: Payer: Self-pay | Admitting: *Deleted

## 2019-03-03 ENCOUNTER — Other Ambulatory Visit: Payer: Self-pay | Admitting: *Deleted

## 2019-03-03 ENCOUNTER — Other Ambulatory Visit: Payer: Self-pay

## 2019-03-03 ENCOUNTER — Other Ambulatory Visit: Payer: Self-pay | Admitting: Family Medicine

## 2019-03-03 DIAGNOSIS — R2681 Unsteadiness on feet: Secondary | ICD-10-CM

## 2019-03-03 DIAGNOSIS — R413 Other amnesia: Secondary | ICD-10-CM

## 2019-03-03 DIAGNOSIS — E1169 Type 2 diabetes mellitus with other specified complication: Secondary | ICD-10-CM

## 2019-03-03 DIAGNOSIS — E1122 Type 2 diabetes mellitus with diabetic chronic kidney disease: Secondary | ICD-10-CM

## 2019-03-03 DIAGNOSIS — E559 Vitamin D deficiency, unspecified: Secondary | ICD-10-CM

## 2019-03-03 DIAGNOSIS — R195 Other fecal abnormalities: Secondary | ICD-10-CM

## 2019-03-03 DIAGNOSIS — I10 Essential (primary) hypertension: Secondary | ICD-10-CM

## 2019-03-03 DIAGNOSIS — G459 Transient cerebral ischemic attack, unspecified: Secondary | ICD-10-CM

## 2019-03-03 DIAGNOSIS — K439 Ventral hernia without obstruction or gangrene: Secondary | ICD-10-CM

## 2019-03-03 DIAGNOSIS — C9110 Chronic lymphocytic leukemia of B-cell type not having achieved remission: Secondary | ICD-10-CM

## 2019-03-03 DIAGNOSIS — R0781 Pleurodynia: Secondary | ICD-10-CM | POA: Diagnosis not present

## 2019-03-03 DIAGNOSIS — E785 Hyperlipidemia, unspecified: Secondary | ICD-10-CM

## 2019-03-03 DIAGNOSIS — H3530 Unspecified macular degeneration: Secondary | ICD-10-CM

## 2019-03-03 DIAGNOSIS — E0590 Thyrotoxicosis, unspecified without thyrotoxic crisis or storm: Secondary | ICD-10-CM

## 2019-03-03 DIAGNOSIS — S2242XA Multiple fractures of ribs, left side, initial encounter for closed fracture: Secondary | ICD-10-CM | POA: Diagnosis not present

## 2019-03-03 DIAGNOSIS — R410 Disorientation, unspecified: Secondary | ICD-10-CM

## 2019-03-03 DIAGNOSIS — W19XXXA Unspecified fall, initial encounter: Secondary | ICD-10-CM

## 2019-03-03 LAB — BAYER DCA HB A1C WAIVED: HB A1C (BAYER DCA - WAIVED): 5.5 % (ref <7.0)

## 2019-03-03 LAB — LIPID PANEL

## 2019-03-03 MED ORDER — METFORMIN HCL ER 500 MG PO TB24: 1000.0000 mg | ORAL_TABLET | Freq: Two times a day (BID) | ORAL | 1 refills | Status: DC

## 2019-03-03 MED ORDER — METFORMIN HCL ER (OSM) 1000 MG PO TB24: 1000.0000 mg | ORAL_TABLET | Freq: Two times a day (BID) | ORAL | 1 refills | Status: DC

## 2019-03-03 NOTE — Telephone Encounter (Signed)
Fax from [facility] Metformin ER 1000 mg OSM 1 tab BID Not covered by insurance Request change to ER 500 mg 2 BID Please advise

## 2019-03-03 NOTE — Telephone Encounter (Signed)
Sent back the way insurance will pay

## 2019-03-04 DIAGNOSIS — E1122 Type 2 diabetes mellitus with diabetic chronic kidney disease: Secondary | ICD-10-CM | POA: Diagnosis not present

## 2019-03-04 DIAGNOSIS — N183 Chronic kidney disease, stage 3 (moderate): Secondary | ICD-10-CM | POA: Diagnosis not present

## 2019-03-04 DIAGNOSIS — I69398 Other sequelae of cerebral infarction: Secondary | ICD-10-CM | POA: Diagnosis not present

## 2019-03-04 DIAGNOSIS — R29898 Other symptoms and signs involving the musculoskeletal system: Secondary | ICD-10-CM | POA: Diagnosis not present

## 2019-03-04 DIAGNOSIS — I129 Hypertensive chronic kidney disease with stage 1 through stage 4 chronic kidney disease, or unspecified chronic kidney disease: Secondary | ICD-10-CM | POA: Diagnosis not present

## 2019-03-04 LAB — CBC WITH DIFFERENTIAL/PLATELET
Basophils Absolute: 0.1 10*3/uL (ref 0.0–0.2)
Basos: 0 %
EOS (ABSOLUTE): 0.2 10*3/uL (ref 0.0–0.4)
Eos: 1 %
Hematocrit: 37.8 % (ref 34.0–46.6)
Hemoglobin: 12.8 g/dL (ref 11.1–15.9)
Immature Grans (Abs): 0 10*3/uL (ref 0.0–0.1)
Immature Granulocytes: 0 %
Lymphocytes Absolute: 9.5 10*3/uL — ABNORMAL HIGH (ref 0.7–3.1)
Lymphs: 54 %
MCH: 31.5 pg (ref 26.6–33.0)
MCHC: 33.9 g/dL (ref 31.5–35.7)
MCV: 93 fL (ref 79–97)
Monocytes Absolute: 1 10*3/uL — ABNORMAL HIGH (ref 0.1–0.9)
Monocytes: 5 %
Neutrophils Absolute: 7.2 10*3/uL — ABNORMAL HIGH (ref 1.4–7.0)
Neutrophils: 40 %
Platelets: 282 10*3/uL (ref 150–450)
RBC: 4.06 x10E6/uL (ref 3.77–5.28)
RDW: 13.3 % (ref 11.7–15.4)
WBC: 18 10*3/uL — ABNORMAL HIGH (ref 3.4–10.8)

## 2019-03-04 LAB — THYROID PANEL WITH TSH
Free Thyroxine Index: 2.7 (ref 1.2–4.9)
T3 Uptake Ratio: 27 % (ref 24–39)
T4, Total: 10.1 ug/dL (ref 4.5–12.0)
TSH: 1.05 u[IU]/mL (ref 0.450–4.500)

## 2019-03-04 LAB — VITAMIN D 25 HYDROXY (VIT D DEFICIENCY, FRACTURES): Vit D, 25-Hydroxy: 35 ng/mL (ref 30.0–100.0)

## 2019-03-04 LAB — BMP8+EGFR
BUN/Creatinine Ratio: 9 — ABNORMAL LOW (ref 12–28)
BUN: 11 mg/dL (ref 8–27)
CO2: 19 mmol/L — ABNORMAL LOW (ref 20–29)
Calcium: 10.2 mg/dL (ref 8.7–10.3)
Chloride: 103 mmol/L (ref 96–106)
Creatinine, Ser: 1.2 mg/dL — ABNORMAL HIGH (ref 0.57–1.00)
GFR calc Af Amer: 47 mL/min/{1.73_m2} — ABNORMAL LOW (ref >59)
GFR calc non Af Amer: 40 mL/min/{1.73_m2} — ABNORMAL LOW (ref >59)
Glucose: 199 mg/dL — ABNORMAL HIGH (ref 65–99)
Potassium: 5 mmol/L (ref 3.5–5.2)
Sodium: 140 mmol/L (ref 134–144)

## 2019-03-04 LAB — LIPID PANEL
Chol/HDL Ratio: 2 ratio (ref 0.0–4.4)
Cholesterol, Total: 134 mg/dL (ref 100–199)
HDL: 66 mg/dL (ref >39)
LDL Calculated: 36 mg/dL (ref 0–99)
Triglycerides: 162 mg/dL — ABNORMAL HIGH (ref 0–149)
VLDL Cholesterol Cal: 32 mg/dL (ref 5–40)

## 2019-03-04 LAB — HEPATIC FUNCTION PANEL
ALT: 18 IU/L (ref 0–32)
AST: 29 IU/L (ref 0–40)
Albumin: 4.3 g/dL (ref 3.6–4.6)
Alkaline Phosphatase: 65 IU/L (ref 39–117)
Bilirubin Total: 0.4 mg/dL (ref 0.0–1.2)
Bilirubin, Direct: 0.12 mg/dL (ref 0.00–0.40)
Total Protein: 6.3 g/dL (ref 6.0–8.5)

## 2019-03-08 DIAGNOSIS — R29898 Other symptoms and signs involving the musculoskeletal system: Secondary | ICD-10-CM | POA: Diagnosis not present

## 2019-03-08 DIAGNOSIS — I129 Hypertensive chronic kidney disease with stage 1 through stage 4 chronic kidney disease, or unspecified chronic kidney disease: Secondary | ICD-10-CM | POA: Diagnosis not present

## 2019-03-08 DIAGNOSIS — E1122 Type 2 diabetes mellitus with diabetic chronic kidney disease: Secondary | ICD-10-CM | POA: Diagnosis not present

## 2019-03-08 DIAGNOSIS — N183 Chronic kidney disease, stage 3 (moderate): Secondary | ICD-10-CM | POA: Diagnosis not present

## 2019-03-08 DIAGNOSIS — I69398 Other sequelae of cerebral infarction: Secondary | ICD-10-CM | POA: Diagnosis not present

## 2019-03-10 DIAGNOSIS — E1122 Type 2 diabetes mellitus with diabetic chronic kidney disease: Secondary | ICD-10-CM | POA: Diagnosis not present

## 2019-03-10 DIAGNOSIS — I69398 Other sequelae of cerebral infarction: Secondary | ICD-10-CM | POA: Diagnosis not present

## 2019-03-10 DIAGNOSIS — I129 Hypertensive chronic kidney disease with stage 1 through stage 4 chronic kidney disease, or unspecified chronic kidney disease: Secondary | ICD-10-CM | POA: Diagnosis not present

## 2019-03-10 DIAGNOSIS — R29898 Other symptoms and signs involving the musculoskeletal system: Secondary | ICD-10-CM | POA: Diagnosis not present

## 2019-03-10 DIAGNOSIS — N183 Chronic kidney disease, stage 3 (moderate): Secondary | ICD-10-CM | POA: Diagnosis not present

## 2019-03-11 NOTE — Patient Instructions (Signed)
Kristy [name] was given information about Chronic Care Management services today including:  1. CCM service includes personalized support from designated clinical staff supervised by her physician, including individualized plan of care and coordination with other care providers 2. 24/7 contact phone numbers for assistance for urgent and routine care needs. 3. Service will only be billed when office clinical staff spend 20 minutes or more in a month to coordinate care. 4. Only one practitioner may furnish and bill the service in a calendar month. 5. The patient may stop CCM services at any time (effective at the end of the month) by phone call to the office staff. 6. The patient will be responsible for cost sharing (co-pay) of up to 20% of the service fee (after annual deductible is met).  Patient did not agree to services and wishes to consider information provided before deciding about enrollment in care management services. We will need to talk with her husband, Kristy [name].   Chong Sicilian, RN-BC, BSN Nurse Care Manager [facility] [phone]

## 2019-03-11 NOTE — Chronic Care Management (AMB) (Signed)
  Care Management Note   Kristy [name] is a 85 y.o. year old female who is a primary care patient of Laurance Flatten Estella Husk, MD. The CM team was consulted for assistance with chronic disease management and care coordination. Her chronic medical conditions include: diabetes, hypertension, memory impairment, vision loss due to macular degeneration, osteoarthritis, and hx of CVA.   I reached out to [name] by phone today.   [name] was given information about Chronic Care Management services today including:  1. CCM service includes personalized support from designated clinical staff supervised by her physician, including individualized plan of care and coordination with other care providers 2. 24/7 contact phone numbers for assistance for urgent and routine care needs. 3. Service will only be billed when office clinical staff spend 20 minutes or more in a month to coordinate care. 4. Only one practitioner may furnish and bill the service in a calendar month. 5. The patient may stop CCM services at any time (effective at the end of the month) by phone call to the office staff. 6. The patient will be responsible for cost sharing (co-pay) of up to 20% of the service fee (after annual deductible is met). Patient did not agree to services and wishes to consider information provided before deciding about enrollment in care management services.    Review of patient status, including review of consultants reports, relevant laboratory and other test results, and collaboration with appropriate care team members and the patient's provider was performed as part of comprehensive patient evaluation and provision of chronic care management services.    Follow Up Plan: The care management team will reach out to the patient again over the next 14 days. We will need to talk with her husband, Kristy [name].    Chong Sicilian, RN-BC, BSN Nurse Care Manager [facility] [phone]

## 2019-03-12 DIAGNOSIS — I69398 Other sequelae of cerebral infarction: Secondary | ICD-10-CM | POA: Diagnosis not present

## 2019-03-12 DIAGNOSIS — I129 Hypertensive chronic kidney disease with stage 1 through stage 4 chronic kidney disease, or unspecified chronic kidney disease: Secondary | ICD-10-CM | POA: Diagnosis not present

## 2019-03-12 DIAGNOSIS — R29898 Other symptoms and signs involving the musculoskeletal system: Secondary | ICD-10-CM | POA: Diagnosis not present

## 2019-03-12 DIAGNOSIS — N183 Chronic kidney disease, stage 3 (moderate): Secondary | ICD-10-CM | POA: Diagnosis not present

## 2019-03-12 DIAGNOSIS — E1122 Type 2 diabetes mellitus with diabetic chronic kidney disease: Secondary | ICD-10-CM | POA: Diagnosis not present

## 2019-03-13 ENCOUNTER — Other Ambulatory Visit: Payer: Self-pay | Admitting: Family Medicine

## 2019-03-15 ENCOUNTER — Other Ambulatory Visit: Payer: Self-pay | Admitting: Family Medicine

## 2019-03-15 DIAGNOSIS — E1122 Type 2 diabetes mellitus with diabetic chronic kidney disease: Secondary | ICD-10-CM | POA: Diagnosis not present

## 2019-03-15 DIAGNOSIS — I69398 Other sequelae of cerebral infarction: Secondary | ICD-10-CM | POA: Diagnosis not present

## 2019-03-15 DIAGNOSIS — R29898 Other symptoms and signs involving the musculoskeletal system: Secondary | ICD-10-CM | POA: Diagnosis not present

## 2019-03-15 DIAGNOSIS — N183 Chronic kidney disease, stage 3 (moderate): Secondary | ICD-10-CM | POA: Diagnosis not present

## 2019-03-15 DIAGNOSIS — I129 Hypertensive chronic kidney disease with stage 1 through stage 4 chronic kidney disease, or unspecified chronic kidney disease: Secondary | ICD-10-CM | POA: Diagnosis not present

## 2019-03-16 ENCOUNTER — Other Ambulatory Visit: Payer: Self-pay | Admitting: Family Medicine

## 2019-03-17 DIAGNOSIS — I69398 Other sequelae of cerebral infarction: Secondary | ICD-10-CM | POA: Diagnosis not present

## 2019-03-17 DIAGNOSIS — N183 Chronic kidney disease, stage 3 (moderate): Secondary | ICD-10-CM | POA: Diagnosis not present

## 2019-03-17 DIAGNOSIS — E1122 Type 2 diabetes mellitus with diabetic chronic kidney disease: Secondary | ICD-10-CM | POA: Diagnosis not present

## 2019-03-17 DIAGNOSIS — I129 Hypertensive chronic kidney disease with stage 1 through stage 4 chronic kidney disease, or unspecified chronic kidney disease: Secondary | ICD-10-CM | POA: Diagnosis not present

## 2019-03-17 DIAGNOSIS — R29898 Other symptoms and signs involving the musculoskeletal system: Secondary | ICD-10-CM | POA: Diagnosis not present

## 2019-03-21 ENCOUNTER — Other Ambulatory Visit: Payer: Self-pay | Admitting: Family Medicine

## 2019-03-22 DIAGNOSIS — R29898 Other symptoms and signs involving the musculoskeletal system: Secondary | ICD-10-CM | POA: Diagnosis not present

## 2019-03-22 DIAGNOSIS — I129 Hypertensive chronic kidney disease with stage 1 through stage 4 chronic kidney disease, or unspecified chronic kidney disease: Secondary | ICD-10-CM | POA: Diagnosis not present

## 2019-03-22 DIAGNOSIS — E1122 Type 2 diabetes mellitus with diabetic chronic kidney disease: Secondary | ICD-10-CM | POA: Diagnosis not present

## 2019-03-22 DIAGNOSIS — I69398 Other sequelae of cerebral infarction: Secondary | ICD-10-CM | POA: Diagnosis not present

## 2019-03-22 DIAGNOSIS — N183 Chronic kidney disease, stage 3 (moderate): Secondary | ICD-10-CM | POA: Diagnosis not present

## 2019-03-24 ENCOUNTER — Other Ambulatory Visit: Payer: Self-pay

## 2019-03-24 ENCOUNTER — Ambulatory Visit (INDEPENDENT_AMBULATORY_CARE_PROVIDER_SITE_OTHER): Payer: Medicare Other

## 2019-03-24 DIAGNOSIS — K635 Polyp of colon: Secondary | ICD-10-CM

## 2019-03-24 DIAGNOSIS — Z96653 Presence of artificial knee joint, bilateral: Secondary | ICD-10-CM

## 2019-03-24 DIAGNOSIS — N183 Chronic kidney disease, stage 3 (moderate): Secondary | ICD-10-CM

## 2019-03-24 DIAGNOSIS — Z856 Personal history of leukemia: Secondary | ICD-10-CM

## 2019-03-24 DIAGNOSIS — E0590 Thyrotoxicosis, unspecified without thyrotoxic crisis or storm: Secondary | ICD-10-CM

## 2019-03-24 DIAGNOSIS — I129 Hypertensive chronic kidney disease with stage 1 through stage 4 chronic kidney disease, or unspecified chronic kidney disease: Secondary | ICD-10-CM

## 2019-03-24 DIAGNOSIS — F419 Anxiety disorder, unspecified: Secondary | ICD-10-CM

## 2019-03-24 DIAGNOSIS — E785 Hyperlipidemia, unspecified: Secondary | ICD-10-CM

## 2019-03-24 DIAGNOSIS — M47816 Spondylosis without myelopathy or radiculopathy, lumbar region: Secondary | ICD-10-CM

## 2019-03-24 DIAGNOSIS — H04123 Dry eye syndrome of bilateral lacrimal glands: Secondary | ICD-10-CM

## 2019-03-24 DIAGNOSIS — F0390 Unspecified dementia without behavioral disturbance: Secondary | ICD-10-CM

## 2019-03-24 DIAGNOSIS — S20219D Contusion of unspecified front wall of thorax, subsequent encounter: Secondary | ICD-10-CM

## 2019-03-24 DIAGNOSIS — E1122 Type 2 diabetes mellitus with diabetic chronic kidney disease: Secondary | ICD-10-CM

## 2019-03-24 DIAGNOSIS — H3530 Unspecified macular degeneration: Secondary | ICD-10-CM

## 2019-03-24 DIAGNOSIS — E559 Vitamin D deficiency, unspecified: Secondary | ICD-10-CM

## 2019-03-24 DIAGNOSIS — I69398 Other sequelae of cerebral infarction: Secondary | ICD-10-CM | POA: Diagnosis not present

## 2019-03-24 DIAGNOSIS — K5790 Diverticulosis of intestine, part unspecified, without perforation or abscess without bleeding: Secondary | ICD-10-CM

## 2019-03-24 DIAGNOSIS — H9190 Unspecified hearing loss, unspecified ear: Secondary | ICD-10-CM

## 2019-03-24 DIAGNOSIS — E7800 Pure hypercholesterolemia, unspecified: Secondary | ICD-10-CM

## 2019-03-24 DIAGNOSIS — B029 Zoster without complications: Secondary | ICD-10-CM

## 2019-03-24 DIAGNOSIS — E1169 Type 2 diabetes mellitus with other specified complication: Secondary | ICD-10-CM

## 2019-03-24 DIAGNOSIS — R29898 Other symptoms and signs involving the musculoskeletal system: Secondary | ICD-10-CM

## 2019-03-24 DIAGNOSIS — Z974 Presence of external hearing-aid: Secondary | ICD-10-CM

## 2019-03-24 DIAGNOSIS — K449 Diaphragmatic hernia without obstruction or gangrene: Secondary | ICD-10-CM

## 2019-03-24 DIAGNOSIS — Z7984 Long term (current) use of oral hypoglycemic drugs: Secondary | ICD-10-CM

## 2019-03-24 DIAGNOSIS — Z792 Long term (current) use of antibiotics: Secondary | ICD-10-CM

## 2019-03-29 DIAGNOSIS — E1122 Type 2 diabetes mellitus with diabetic chronic kidney disease: Secondary | ICD-10-CM | POA: Diagnosis not present

## 2019-03-29 DIAGNOSIS — I129 Hypertensive chronic kidney disease with stage 1 through stage 4 chronic kidney disease, or unspecified chronic kidney disease: Secondary | ICD-10-CM | POA: Diagnosis not present

## 2019-03-29 DIAGNOSIS — N183 Chronic kidney disease, stage 3 (moderate): Secondary | ICD-10-CM | POA: Diagnosis not present

## 2019-03-29 DIAGNOSIS — R29898 Other symptoms and signs involving the musculoskeletal system: Secondary | ICD-10-CM | POA: Diagnosis not present

## 2019-03-29 DIAGNOSIS — I69398 Other sequelae of cerebral infarction: Secondary | ICD-10-CM | POA: Diagnosis not present

## 2019-04-06 DIAGNOSIS — B351 Tinea unguium: Secondary | ICD-10-CM | POA: Diagnosis not present

## 2019-04-06 DIAGNOSIS — L84 Corns and callosities: Secondary | ICD-10-CM | POA: Diagnosis not present

## 2019-04-06 DIAGNOSIS — E1142 Type 2 diabetes mellitus with diabetic polyneuropathy: Secondary | ICD-10-CM | POA: Diagnosis not present

## 2019-04-06 DIAGNOSIS — M79676 Pain in unspecified toe(s): Secondary | ICD-10-CM | POA: Diagnosis not present

## 2019-04-19 ENCOUNTER — Ambulatory Visit: Payer: Medicare Other | Admitting: Neurology

## 2019-04-20 NOTE — Progress Notes (Signed)
PATIENT: Kristy [name] DOB: [birth year]  REASON FOR VISIT: follow up HISTORY FROM: patient  HISTORY OF PRESENT ILLNESS: Today 04/21/19  Kristy [name] is an 85 year old female with history of events of transient alteration of awareness.  Kristy [name] also has a significant memory issue and impairment of her vision, with severe macular degeneration.  While on the [place], Kristy [name] did not have recurrence of events, her dose was reduced to due to side effect of drowsiness.  Kristy [name] began to have some paranoid behavior while on the Keppra, Kristy [name] was transitioned to Depakote.  Her Depakote was discontinued in April 2020 due to problems not recognizing her husband.  Kristy [name] had normal EEG in September 2019.  Today, Kristy [name] presents for follow-up and reports Kristy [name] has not had any further events of staring off or aphasia.  Her son reports Kristy [name] is not as lethargic and has been doing well off medication.  Kristy [name] currently lives with her husband, Kristy [name] does not drive a car.  Kristy [name] is able to perform her ADLs.  Kristy [name] does not do much cooking, due to vision problems.  Kristy [name] says about 3 months ago Kristy [name] had a fall, broke 3 ribs, since then Kristy [name] has been encouraged to use a walker.  Her son indicates that her memory is stable, they have not noticed any decline.  Kristy [name] presents today for follow-up accompanied by her son.  HISTORY  10/19/2018 Dr. Jannifer Franklin: Kristy [name] is an 85 year old right-handed white female with a history of events of transient alteration of awareness.  The patient has had 2 events of staring off, another 2 events of what sounds like aphasia, the patient had no recollection of events following the episodes.  The patient however likely is developing a significant memory issue.  Kristy [name] has severe macular degeneration and impairment of vision.  Kristy [name] is having occasional hallucinations, Kristy [name] is requiring assistance keeping up with medications and appointments, Kristy [name] does not cook.  Many of her losses of actives of daily living are related to her vision, but her  family does believe that her memory is failing.  Her husband recently had go in the hospital with the flu and a heart attack, this resulted in severe confusion on the part of the patient.  The patient comes in today with her son.  The patient is quite sleepy on the Keppra, Kristy [name] is wanting to sleep throughout the day.  No further events have been noted while on Keppra.  REVIEW OF SYSTEMS: Out of a complete 14 system review of symptoms, the patient complains only of the following symptoms, and all other reviewed systems are negative.  Vision problem, gait abnormality, seizure-like episodes, memory loss  ALLERGIES: Allergies  Allergen Reactions  . Vioxx [Rofecoxib] Other (See Comments)    Ulcers in mouth     HOME MEDICATIONS: Outpatient Medications Prior to Visit  Medication Sig Dispense Refill  . cephALEXin (KEFLEX) 500 MG capsule Take 1 capsule (500 mg total) by mouth 2 (two) times daily. 14 capsule 0  . cholecalciferol (VITAMIN D) 1000 UNITS tablet Take 1,000 Units by mouth daily.    Marland Kitchen ezetimibe (ZETIA) 10 MG tablet TAKE 1 TABLET BY MOUTH EVERY DAY 90 tablet 1  . furosemide (LASIX) 40 MG tablet Take 40 mg by mouth 2 (two) times daily as needed for fluid.     Marland Kitchen glimepiride (AMARYL) 4 MG tablet TAKE 1 TABLET BY MOUTH DAILY WITH BREAKFAST 90 tablet 1  . glucose blood (FREESTYLE TEST STRIPS) test strip Check Blood sugar two  times daily 300 each 2  . glucose blood (ONE TOUCH ULTRA TEST) test strip CHECK BLOOD SUGAR TWO TIMES DAILY 100 each 8  . hydroxypropyl methylcellulose (ISOPTO TEARS) 2.5 % ophthalmic solution Place 1 drop into both eyes 2 (two) times daily as needed (dry eyes).    . Lancets (ONETOUCH ULTRASOFT) lancets Check blood sugars twice a day 200 each 3  . lisinopril (ZESTRIL) 10 MG tablet TAKE 1/2 TABLET BY MOUTH DAILY AS DIRECTED 45 tablet 1  . LUMIGAN 0.01 % SOLN     . metFORMIN (GLUCOPHAGE-XR) 500 MG 24 hr tablet Take 2 tablets (1,000 mg total) by mouth 2 (two) times a day. 360  tablet 1  . metoprolol succinate (TOPROL-XL) 25 MG 24 hr tablet TAKE 1 TABLET BY MOUTH EVERY DAY 90 tablet 1  . Multiple Vitamins-Minerals (PRESERVISION AREDS 2 PO) Take by mouth.    . Omega-3 Fatty Acids (FISH OIL) 1000 MG CAPS Take 1 capsule by mouth daily.    . pravastatin (PRAVACHOL) 80 MG tablet TAKE 1 TABLET BY MOUTH EVERYDAY AT BEDTIME 90 tablet 1  . busPIRone (BUSPAR) 30 MG tablet TAKE 1 TABLET BY MOUTH TWICE A DAY 60 tablet 2  . divalproex (DEPAKOTE) 250 MG DR tablet TAKE 1 TABLET BY MOUTH IN THE MORNING AND 2 IN THE EVENING (Patient not taking: Reported on 02/26/2019) 90 tablet 2   No facility-administered medications prior to visit.     PAST MEDICAL HISTORY: Past Medical History:  Diagnosis Date  . Anxiety   . Colon polyps   . Complication of anesthesia 01-18-2013   slow to awaken and felt crazy for 3-4 days  . Diabetes mellitus   . Diverticulitis   . H/O hiatal hernia   . Hearing loss 01-07-13   bilateral hearing aids  . History of shingles 01-07-13   3'14 recent outbreak- drying in   . HOH (hard of hearing) 02/05/2018  . Hypercholesteremia   . Hypertension    Dr. Laurance Flatten  . Lumbar spondylosis    osteoarthritis-knees  . Macular degeneration of both eyes    and dry eyes  . Stroke [facility])    TIA    PAST SURGICAL HISTORY: Past Surgical History:  Procedure Laterality Date  . ABDOMINAL HYSTERECTOMY  1987  . APPENDECTOMY     pt thinks this was removed when gall bladder was taken out  . CATARACT EXTRACTION    . CHOLECYSTECTOMY  1989   open  . KNEE ARTHROSCOPY  01-07-13   left knee- many yrs ago  . Lumbar back surgery  2013  . TOTAL KNEE ARTHROPLASTY Left 01/18/2013   Procedure: TOTAL KNEE ARTHROPLASTY;  Surgeon: Gearlean Alf, MD;  Location: [facility];  Service: Orthopedics;  Laterality: Left;  . TOTAL KNEE ARTHROPLASTY Right 03/07/2014   Procedure: RIGHT TOTAL KNEE ARTHROPLASTY;  Surgeon: Gearlean Alf, MD;  Location: [facility];  Service: Orthopedics;  Laterality: Right;  .  VENTRAL HERNIA REPAIR      FAMILY HISTORY: Family History  Problem Relation Age of Onset  . Stroke Father 43  . Heart disease Father   . Irregular heart beat Father        pacemaker   . Healthy Mother   . Healthy Son   . Heart disease Sister        unknown heart condition   . Bipolar disorder Son   . COPD Son   . Cancer Sister        colon    SOCIAL HISTORY:  Social History   Socioeconomic History  . Marital status: Married    Spouse name: [name]   . Number of children: 2  . Years of education: Not on file  . Highest education level: Not on file  Occupational History  . Occupation: retired    Comment: Teaching laboratory technician  . Financial resource strain: Not on file  . Food insecurity    Worry: Not on file    Inability: Not on file  . Transportation needs    Medical: Not on file    Non-medical: Not on file  Tobacco Use  . Smoking status: Never Smoker  . Smokeless tobacco: Never Used  Substance and Sexual Activity  . Alcohol use: No    Alcohol/week: 0.0 standard drinks  . Drug use: No  . Sexual activity: Not Currently  Lifestyle  . Physical activity    Days per week: Not on file    Minutes per session: Not on file  . Stress: Not on file  Relationships  . Social Herbalist on phone: Not on file    Gets together: Not on file    Attends religious service: Not on file    Active member of club or organization: Not on file    Attends meetings of clubs or organizations: Not on file    Relationship status: Not on file  . Intimate partner violence    Fear of current or ex partner: Not on file    Emotionally abused: Not on file    Physically abused: Not on file    Forced sexual activity: Not on file  Other Topics Concern  . Not on file  Social History Narrative   Lives with husband    PHYSICAL EXAM  Vitals:   04/21/19 0848  BP: (!) 148/70  Pulse: 77  Temp: 97.7 F (36.5 C)  Weight: 164 lb 3.2 oz (74.5 kg)  Height: 5\' 2"  (1.575 m)   Body  mass index is 30.03 kg/m.  Generalized: Well developed, in no acute distress  MMSE - Mini Mental State Exam 04/21/2019 10/19/2018 12/31/2017  Not completed: - - -  Orientation to time 4 2 4   Orientation to Place 3 4 5   Registration 3 3 3   Attention/ Calculation 5 5 5   Recall 2 3 3   Language- name 2 objects 2 2 2   Language- repeat 1 0 1  Language- follow 3 step command 3 3 3   Language- read & follow direction 1 0 0  Language-read & follow direction-comments Cannot see - -  Write a sentence 1 0 1  Write a sentence-comments cannot see - -  Copy design 1 0 0  Copy design-comments cannot see. Named 5 animals - -  Total score 26 22 27     Neurological examination  Mentation: Alert oriented to time, place, history taking. Follows all commands speech and language fluent Cranial nerve II-XII: Pupils were equal round reactive to light. Extraocular movements were full. Facial sensation and strength were normal. Uvula tongue midline. Head turning and shoulder shrug were normal and symmetric. Motor: The motor testing reveals 5 over 5 strength of all 4 extremities. Good symmetric motor tone is noted throughout.  Sensory: Sensory testing is intact to soft touch on all 4 extremities. No evidence of extinction is noted.  Coordination: Cerebellar testing reveals good finger-nose-finger and heel-to-shin bilaterally.  Gait and station: Kristy [name] has to push off from seated position, slow to rise, gait is mildly unsteady, wide-based, no assistive  devices at visit Reflexes: Deep tendon reflexes are symmetric  DIAGNOSTIC DATA (LABS, IMAGING, TESTING) - I reviewed patient records, labs, notes, testing and imaging myself where available.  Lab Results  Component Value Date   WBC 18.0 (H) 03/03/2019   HGB 12.8 03/03/2019   HCT 37.8 03/03/2019   MCV 93 03/03/2019   PLT 282 03/03/2019      Component Value Date/Time   NA 140 03/03/2019 1030   NA 141 11/16/2014 0852   K 5.0 03/03/2019 1030   K 4.5 11/16/2014  0852   CL 103 03/03/2019 1030   CL 105 06/17/2012 0845   CO2 19 (L) 03/03/2019 1030   CO2 24 11/16/2014 0852   GLUCOSE 199 (H) 03/03/2019 1030   GLUCOSE 215 (H) 06/30/2018 1633   GLUCOSE 242 (H) 11/16/2014 0852   GLUCOSE 269 (H) 06/17/2012 0845   BUN 11 03/03/2019 1030   BUN 16.6 11/16/2014 0852   CREATININE 1.20 (H) 03/03/2019 1030   CREATININE 1.1 11/16/2014 0852   CALCIUM 10.2 03/03/2019 1030   CALCIUM 9.5 11/16/2014 0852   PROT 6.3 03/03/2019 1030   PROT 6.0 (L) 11/16/2014 0852   ALBUMIN 4.3 03/03/2019 1030   ALBUMIN 3.5 11/16/2014 0852   AST 29 03/03/2019 1030   AST 11 11/16/2014 0852   ALT 18 03/03/2019 1030   ALT 9 11/16/2014 0852   ALKPHOS 65 03/03/2019 1030   ALKPHOS 36 (L) 11/16/2014 0852   BILITOT 0.4 03/03/2019 1030   BILITOT 0.49 11/16/2014 0852   GFRNONAA 40 (L) 03/03/2019 1030   GFRAA 47 (L) 03/03/2019 1030   Lab Results  Component Value Date   CHOL 134 03/03/2019   HDL 66 03/03/2019   LDLCALC 36 03/03/2019   TRIG 162 (H) 03/03/2019   CHOLHDL 2.0 03/03/2019   Lab Results  Component Value Date   HGBA1C 5.5 03/03/2019   No results found for: VITAMINB12 Lab Results  Component Value Date   TSH 1.050 03/03/2019    ASSESSMENT AND PLAN 85 y.o. year old female  has a past medical history of Anxiety, Colon polyps, Complication of anesthesia (01-18-2013), Diabetes mellitus, Diverticulitis, H/O hiatal hernia, Hearing loss (01-07-13), History of shingles (01-07-13), HOH (hard of hearing) (02/05/2018), Hypercholesteremia, Hypertension, Lumbar spondylosis, Macular degeneration of both eyes, and Stroke ([place]). here with:  1.  Episodes of transient alteration of awareness, possible seizures 2.  Macular degeneration, severe vision impairment 3.  Memory disturbance  Kristy [name] had to come off her Keppra and Depakote due to side effects of drowsiness and hallucinations.  Since being off medication, Kristy [name] has not had any further episodes of transient alteration of awareness.  Her  son reports that Kristy [name] has been doing quite well.  Kristy [name] did have a normal EEG in September 2019.  Kristy [name] had CT angiogram of the head and neck in October 2019, did not show any critical blockages.  Since Kristy [name] has not had further events, we will hold off on further medication at this time.  In regards to her memory, her son reports it is stable.  Kristy [name] is still high functioning, however macular degeneration severely affects her vision.  We discussed possibly starting a medication for memory, however they deferred at this time.  We will probably hold off on Aricept given the possibility of seizure like events in the past, would consider Namenda.  I encouraged him to continue follow-up with her primary care provider.  Kristy [name] will follow-up in this office in 6 months or sooner if needed.  I advised  that if her symptoms worsen or Kristy [name] develops any new symptoms Kristy [name] should let us know.  I have encouraged her to use her walker, to prevent falls.    I spent 15 minutes with the patient. 50% of this time was spent discussing her plan of care.   Butler Denmark, AGNP-C, DNP 04/21/2019, 8:55 AM [facility] [address], [place], [place] [address] [phone]

## 2019-04-21 ENCOUNTER — Other Ambulatory Visit: Payer: Self-pay

## 2019-04-21 ENCOUNTER — Encounter: Payer: Self-pay | Admitting: Neurology

## 2019-04-21 ENCOUNTER — Ambulatory Visit: Payer: Medicare Other | Admitting: Neurology

## 2019-04-21 DIAGNOSIS — R404 Transient alteration of awareness: Secondary | ICD-10-CM

## 2019-04-21 DIAGNOSIS — G3184 Mild cognitive impairment, so stated: Secondary | ICD-10-CM

## 2019-04-21 NOTE — Patient Instructions (Signed)
It was wonderful to meet you both today! I will see you in 6 months!

## 2019-04-21 NOTE — Progress Notes (Signed)
I have read the note, and I agree with the clinical assessment and plan.  Tyrina Hines K Keniyah Gelinas   

## 2019-05-20 ENCOUNTER — Encounter: Payer: Self-pay | Admitting: Family Medicine

## 2019-05-20 ENCOUNTER — Other Ambulatory Visit: Payer: Self-pay

## 2019-05-20 ENCOUNTER — Ambulatory Visit (INDEPENDENT_AMBULATORY_CARE_PROVIDER_SITE_OTHER): Payer: Medicare Other | Admitting: Family Medicine

## 2019-05-20 VITALS — BP 146/63 | HR 71 | Temp 97.5°F | Ht 62.0 in | Wt 168.6 lb

## 2019-05-20 DIAGNOSIS — E1159 Type 2 diabetes mellitus with other circulatory complications: Secondary | ICD-10-CM | POA: Diagnosis not present

## 2019-05-20 DIAGNOSIS — N183 Chronic kidney disease, stage 3 (moderate): Secondary | ICD-10-CM

## 2019-05-20 DIAGNOSIS — E785 Hyperlipidemia, unspecified: Secondary | ICD-10-CM | POA: Diagnosis not present

## 2019-05-20 DIAGNOSIS — D638 Anemia in other chronic diseases classified elsewhere: Secondary | ICD-10-CM | POA: Diagnosis not present

## 2019-05-20 DIAGNOSIS — E1122 Type 2 diabetes mellitus with diabetic chronic kidney disease: Secondary | ICD-10-CM

## 2019-05-20 DIAGNOSIS — I10 Essential (primary) hypertension: Secondary | ICD-10-CM

## 2019-05-20 DIAGNOSIS — E1169 Type 2 diabetes mellitus with other specified complication: Secondary | ICD-10-CM

## 2019-05-20 DIAGNOSIS — D649 Anemia, unspecified: Secondary | ICD-10-CM | POA: Insufficient documentation

## 2019-05-20 LAB — BAYER DCA HB A1C WAIVED: HB A1C (BAYER DCA - WAIVED): 6.4 % (ref <7.0)

## 2019-05-20 NOTE — Progress Notes (Signed)
BP (!) 146/63   Pulse 71   Temp (!) 97.5 F (36.4 C) (Oral)   Ht 5\' 2"  (1.575 m)   Wt 168 lb 9.6 oz (76.5 kg)   BMI 30.84 kg/m    Subjective:   Patient ID: Kristy [name], female    DOB: August 31, 1928, 85 y.o.   MRN: [id number]  HPI: Kristy [name] is a 85 y.o. female presenting on 05/20/2019 for Establish Care (DWM) and Dizziness (ongoing)   HPI Type 2 diabetes mellitus Patient comes in today for recheck of his diabetes. Patient has been currently taking glimepiride and metformin. Patient is currently on an ACE inhibitor/ARB. Patient has not seen an ophthalmologist this year. Patient denies any issues with their feet.  Patient has CKD stage III  Hypertension Patient is currently on lisinopril 5 mg Toprol 25, and their blood pressure today is 146/63. Patient denies any lightheadedness or dizziness. Patient denies headaches, blurred vision, chest pains, shortness of breath, or weakness. Denies any side effects from medication and is content with current medication.   Hyperlipidemia Patient is coming in for recheck of his hyperlipidemia. The patient is currently taking fish oil and Zetia and pravastatin. They deny any issues with myalgias or history of liver damage from it. They deny any focal numbness or weakness or chest pain.   Patient has significant vision issues due to macular degeneration and is essentially legally blind and is very hard of hearing as well  Relevant past medical, surgical, family and social history reviewed and updated as indicated. Interim medical history since our last visit reviewed. Allergies and medications reviewed and updated.  Review of Systems  Constitutional: Negative for chills and fever.  HENT: Positive for hearing loss. Negative for congestion, ear discharge and ear pain.   Eyes: Positive for visual disturbance. Negative for redness.  Respiratory: Negative for chest tightness and shortness of breath.   Cardiovascular: Negative for chest pain and  leg swelling.  Genitourinary: Negative for difficulty urinating and dysuria.  Musculoskeletal: Negative for back pain and gait problem.  Skin: Negative for rash.  Neurological: Negative for dizziness, light-headedness and headaches.  Psychiatric/Behavioral: Negative for agitation and behavioral problems.  All other systems reviewed and are negative.   Per HPI unless specifically indicated above   Allergies as of 05/20/2019      Reactions   Vioxx [rofecoxib] Other (See Comments)   Ulcers in mouth      Medication List       Accurate as of May 20, 2019  8:55 AM. If you have any questions, ask your nurse or doctor.        STOP taking these medications   cephALEXin 500 MG capsule Commonly known as: KEFLEX Stopped by: Fransisca Kaufmann Dettinger, MD     TAKE these medications   cholecalciferol 1000 units tablet Commonly known as: VITAMIN D Take 1,000 Units by mouth daily.   ezetimibe 10 MG tablet Commonly known as: ZETIA TAKE 1 TABLET BY MOUTH EVERY DAY   Fish Oil 1000 MG Caps Take 1 capsule by mouth daily.   furosemide 40 MG tablet Commonly known as: LASIX Take 40 mg by mouth 2 (two) times daily as needed for fluid.   glimepiride 4 MG tablet Commonly known as: AMARYL TAKE 1 TABLET BY MOUTH DAILY WITH BREAKFAST   glucose blood test strip Commonly known as: FREESTYLE TEST STRIPS Check Blood sugar two times daily   glucose blood test strip Commonly known as: ONE TOUCH ULTRA TEST CHECK BLOOD  SUGAR TWO TIMES DAILY   hydroxypropyl methylcellulose / hypromellose 2.5 % ophthalmic solution Commonly known as: ISOPTO TEARS / GONIOVISC Place 1 drop into both eyes 2 (two) times daily as needed (dry eyes).   lisinopril 10 MG tablet Commonly known as: ZESTRIL TAKE 1/2 TABLET BY MOUTH DAILY AS DIRECTED   Lumigan 0.01 % Soln Generic drug: bimatoprost   metFORMIN 500 MG 24 hr tablet Commonly known as: GLUCOPHAGE-XR Take 2 tablets (1,000 mg total) by mouth 2 (two) times a  day.   metoprolol succinate 25 MG 24 hr tablet Commonly known as: TOPROL-XL TAKE 1 TABLET BY MOUTH EVERY DAY   onetouch ultrasoft lancets Check blood sugars twice a day   pravastatin 80 MG tablet Commonly known as: PRAVACHOL TAKE 1 TABLET BY MOUTH EVERYDAY AT BEDTIME   PRESERVISION AREDS 2 PO Take by mouth.        Objective:   BP (!) 146/63   Pulse 71   Temp (!) 97.5 F (36.4 C) (Oral)   Ht 5\' 2"  (1.575 m)   Wt 168 lb 9.6 oz (76.5 kg)   BMI 30.84 kg/m   Wt Readings from Last 3 Encounters:  05/20/19 168 lb 9.6 oz (76.5 kg)  04/21/19 164 lb 3.2 oz (74.5 kg)  10/22/18 170 lb (77.1 kg)    Physical Exam Vitals signs and nursing note reviewed.  Constitutional:      General: She is not in acute distress.    Appearance: She is well-developed. She is not diaphoretic.  Eyes:     Conjunctiva/sclera: Conjunctivae normal.  Cardiovascular:     Rate and Rhythm: Normal rate and regular rhythm.     Heart sounds: Normal heart sounds. No murmur.  Pulmonary:     Effort: Pulmonary effort is normal. No respiratory distress.     Breath sounds: Normal breath sounds. No wheezing.  Musculoskeletal: Normal range of motion.        General: No tenderness.  Skin:    General: Skin is warm and dry.     Findings: No rash.  Neurological:     Mental Status: She is alert and oriented to person, place, and time.     Coordination: Coordination normal.  Psychiatric:        Behavior: Behavior normal.       Assessment & Plan:   Problem List Items Addressed This Visit      Cardiovascular and Mediastinum   Hypertension associated with diabetes ([place])   Relevant Orders   AMB Referral to [place] Management     Endocrine   Hyperlipidemia associated with type 2 diabetes mellitus ([place])   Relevant Orders   AMB Referral to [place] [facility]   Lipid panel   Diabetes mellitus type 2 controlled   Relevant Orders   Bayer DCA Hb A1c Waived   AMB Referral to [place] [facility]  Hb A1c Waived   AMB Referral to [place] Management   Type 2 diabetes mellitus with stage 3 chronic kidney disease ([place]) - Primary   Relevant Orders   Bayer DCA Hb A1c Waived   AMB Referral to [place] Management     Other   Anemia   Relevant Orders   CBC with Differential/Platelet   AMB Referral to [place] Management      Stop taking the Lasix scheduled daily and just use it as needed, she can stop the fish oils and will do referral to [facility], check blood work and will  consider from there if we need to continue the medication. Follow up plan: Return in about 3 months (around 08/20/2019), or if symptoms worsen or fail to improve, for Diabetes recheck.  Counseling provided for all of the vaccine components Orders Placed This Encounter  Procedures  . Bayer DCA Hb A1c Waived  . CBC with Differential/Platelet  . Lipid panel  . AMB Referral to Orosi Dettinger, MD [facility] 05/20/2019, 8:55 AM

## 2019-05-20 NOTE — Patient Outreach (Signed)
[place] [facility]) Care Management  05/20/2019  Kristy [name] 06/29/1928 [id number]   Telephone Screen  Referral Date: 05/20/2019 Referral Source: MD Office Referral Reason: " DM, vision deficit and falls" Insurance: [facility] Medicare   Outreach attempt # 1 to patient at main number listed for patient. Female answered and identified himself as patient's son-[name]. He states that patient is not available at present. RN CM left contact info for patient to return call.       Plan: RN CM will make outreach attempt to patient within 3-4 business days. RN CM will send unsuccessful outreach letter to patient.   Enzo Montgomery, RN,BSN,CCM [place] Management Telephonic Care Management Coordinator Direct Phone: [phone] Toll Free: [phone] Fax: [phone]

## 2019-05-21 LAB — CBC WITH DIFFERENTIAL/PLATELET
Basophils Absolute: 0.1 10*3/uL (ref 0.0–0.2)
Basos: 0 %
EOS (ABSOLUTE): 0.2 10*3/uL (ref 0.0–0.4)
Eos: 2 %
Hematocrit: 37.2 % (ref 34.0–46.6)
Hemoglobin: 12.4 g/dL (ref 11.1–15.9)
Immature Grans (Abs): 0 10*3/uL (ref 0.0–0.1)
Immature Granulocytes: 0 %
Lymphocytes Absolute: 9.3 10*3/uL — ABNORMAL HIGH (ref 0.7–3.1)
Lymphs: 60 %
MCH: 30.7 pg (ref 26.6–33.0)
MCHC: 33.3 g/dL (ref 31.5–35.7)
MCV: 92 fL (ref 79–97)
Monocytes Absolute: 0.8 10*3/uL (ref 0.1–0.9)
Monocytes: 5 %
Neutrophils Absolute: 5 10*3/uL (ref 1.4–7.0)
Neutrophils: 33 %
Platelets: 177 10*3/uL (ref 150–450)
RBC: 4.04 x10E6/uL (ref 3.77–5.28)
RDW: 12.3 % (ref 11.7–15.4)
WBC: 15.4 10*3/uL — ABNORMAL HIGH (ref 3.4–10.8)

## 2019-05-21 LAB — LIPID PANEL
Chol/HDL Ratio: 2.3 ratio (ref 0.0–4.4)
Cholesterol, Total: 125 mg/dL (ref 100–199)
HDL: 55 mg/dL (ref >39)
LDL Calculated: 44 mg/dL (ref 0–99)
Triglycerides: 128 mg/dL (ref 0–149)
VLDL Cholesterol Cal: 26 mg/dL (ref 5–40)

## 2019-05-24 ENCOUNTER — Other Ambulatory Visit: Payer: Self-pay

## 2019-05-24 NOTE — Patient Outreach (Signed)
[place] [facility]) Care Management  05/24/2019  Kristy [name] 1928-06-10 [id number]   Telephone Screen  Referral Date: 05/20/2019 Referral Source: MD Office Referral Reason: " DM, vision deficit and falls" Insurance: [facility] Medicare  Outreach attempt #2 to patient. Spoke with [name](DPR on file). Discussed referral source and reason with son. He reports that his wife(Kristy [name])works for [employer] and thought patient could benefit from services. However, he is not really sure what patient needs and requested that RN CM speak with his wife. He will have her contact RN CM back.    Plan: RN CM will make outreach attempt to patient/family within 3-4 business days if no return call.   Enzo Montgomery, RN,BSN,CCM [place] Management Telephonic Care Management Coordinator Direct Phone: [phone] Toll Free: [phone] Fax: [phone]

## 2019-05-25 ENCOUNTER — Other Ambulatory Visit: Payer: Self-pay

## 2019-05-25 NOTE — Patient Outreach (Signed)
Kristy [name] [facility]) Care Management  05/25/2019  BEREN MCHAN 1928-07-20 [id number]     Telephone Screen  Referral Date:05/20/2019 Referral Source:MD Office Referral Reason:" DM, vision deficit and falls" Insurance:[employer] Medicare    Voicemail message received from daughter in law-Jennifer. Return call placed to daughter in law at [phone] on file). Daughter in law states that she work for [employer] herself in the [employer]. She shares that during recent visit with patient to PCP office she mentioned to PCP if patient may benefit from [facility] services and requested referral. Screening call completed with daughter in law.  Social: Patient resides in her home along with her spouse. She is fairly independent with ADLs but requires assistance with most IADLs. She relies on spouse or her children to take her to MD appts. Daughter in law reports that patient is extremely HOH and legally blind. She states that due to vision impairments patient has had some falls in the home. Last fall was about  a month ago. Patient has a walker in the home but family reports she is "stubborn" and does not like to use it. Spouse has voiced some caregiver fatigue/burnout as he is unable to leave patient unattended. However, patient is skeptical and hesitant about people coming into their home.   Conditions: Per chart review, patient has PMH of DM, CKD stage 3, legally blind, HLD, macular degeneration and HOH. Daughter in law states that they are monitoring cbgs once a day in the morning and report ENL range limits readings. Last A1C was 5.5(June 2020). Patient's current medical conditions are managed and under control at present. She does not feel like patient would benefit from telephonic nursing services especially due to hearing loss.  Medication: Per family, patient taking about 8-10 meds. She denies any issues affording meds a this time. She states that she along with the patient's son fills med  planner weekly for patient. Patient's spouse then tries to remind and assist patient with taking meds. However, she states that patient is "forgetting" and not taking meds at least 1-2x/week every week which is concerning for family.  Appointments: Patient followed by PCP-saw last week and is also followed by neurologist(Dr. Jannifer Franklin).  Advance Directives: Daughter in law is unsure.  Consent: [facility] services reviewed and discussed. Verbal consent for services given by daughter in law. She requests that she be the contact person for patient due to patient's hearing issues.  Plan: RN CM will send [facility] SW referral for possible resources/assistance with vision impaired. RN CM will send [facility] referral for possible assistance with med mgmt.   Enzo Montgomery, RN,BSN,CCM [place] [facility] Telephonic Care Management Coordinator Direct Phone: [phone] Toll Free: [phone] Fax: [phone]

## 2019-05-27 ENCOUNTER — Ambulatory Visit: Payer: Medicare Other

## 2019-05-27 ENCOUNTER — Other Ambulatory Visit: Payer: Self-pay

## 2019-05-27 NOTE — Patient Outreach (Signed)
[place] [facility]) Care Management  05/27/2019  RICA RIBAR 18-Jul-1928 [id number]   Social work referral received from [facility], [employer].  "Please contact daughter in law-Jennifer Krack(DPR on [phone])  Patient very [place] and legally blind. Family interested in knowing if there are any services/resources/assisatnce available to assist patient." Unsuccessful outreach to daughter-in-law today.  Left voicemail message.  Will attempt to reach again within four business days.  Ronn Melena, BSW Social Worker [phone]

## 2019-05-31 ENCOUNTER — Other Ambulatory Visit: Payer: Self-pay

## 2019-05-31 ENCOUNTER — Ambulatory Visit: Payer: Self-pay

## 2019-05-31 NOTE — Patient Outreach (Signed)
[facility] [facility]) Care Management  05/31/2019  Kristy [name] 04-16-1928 [id number]   Social work referral received from [facility], [employer].  "Please contact daughter in law-[name](DPR on [phone])  Patient very [place] and legally blind. Family interested in knowing if there are any services/resources/assisatnce available to assist patient." Second unsuccessful outreach to daughter-in-law today.  Left voicemail message and mailed Unsuccessful Outreach Letter.  Will attempt to reach again within four business days.  Ronn Melena, BSW Social Worker [phone]

## 2019-06-01 ENCOUNTER — Other Ambulatory Visit: Payer: Self-pay

## 2019-06-01 NOTE — Patient Outreach (Signed)
[place] [facility]) Care Management  06/01/2019  Kristy [name] 1928-05-11 [id number]   Received return call from patient's daughter-in-law regarding social work referral for possible resources for [facility] as well as legally blind.   Per daughter-in-law, patient has hearing aids but does not always use them.  Discussed programs for assistive devices through [place] [facility] of [facility].  Per request of daughter-in-law, information on programs was emailed to her.  She was also provided with contact information for social worker at [facility] and encouraged to contact regarding potential assistance.  Daughter-in-law inquired about resources for in-home assistance.  She reported that patient does not qualify for Medicaid.  Therefore, in-home aide services must be privately paid for.  She reports that this has been discussed with patient's spouse but he is hesitant to pay for these services.  Closing social work case at this time but encouraged daughter-in-law to call back if additional assistance is needed.  Ronn Melena, BSW Social Worker [phone]

## 2019-06-02 ENCOUNTER — Ambulatory Visit: Payer: Self-pay

## 2019-06-04 ENCOUNTER — Other Ambulatory Visit: Payer: Self-pay | Admitting: Pharmacist

## 2019-06-04 NOTE — Patient Outreach (Signed)
[place] [facility]) Care Management  [place]  06/04/2019  QUANITA WOLVERTON 1928/02/04 [id number]   Reason for referral: Medication Management  Referral source: [facility] Social Worker Current insurance: [employer]    Outreach:  Unsuccessful telephone call attempt #1 to patient.   HIPAA compliant voicemail left requesting a return call  Plan:  -I will make another outreach attempt to patient within 3-4 business days.    Regina Eck, PharmD, [place]  [phone]

## 2019-06-08 ENCOUNTER — Ambulatory Visit: Payer: Self-pay | Admitting: Pharmacist

## 2019-06-10 ENCOUNTER — Ambulatory Visit: Payer: Self-pay | Admitting: Pharmacist

## 2019-06-10 ENCOUNTER — Other Ambulatory Visit: Payer: Self-pay | Admitting: Pharmacist

## 2019-06-10 NOTE — Patient Outreach (Signed)
[place] [facility]) Care Management  [place]  06/10/2019  KOLLETTE ROLF 02-Sep-1928 [id number]   Reason for referral: Medication Management  Referral source: [facility] Social Worker Current insurance: [employer]  Outreach:  Unsuccessful telephone call attempt #2 to patient/daughter in law Reita Chard.   HIPAA compliant voicemail left requesting a return call  Plan:  -I will make another outreach attempt to patient within 3-4 business days.    Regina Eck, PharmD, [place]  [phone]

## 2019-06-14 ENCOUNTER — Ambulatory Visit: Payer: Self-pay | Admitting: Pharmacist

## 2019-06-22 ENCOUNTER — Other Ambulatory Visit: Payer: Self-pay

## 2019-06-22 ENCOUNTER — Other Ambulatory Visit: Payer: Self-pay | Admitting: Family Medicine

## 2019-06-22 ENCOUNTER — Ambulatory Visit (INDEPENDENT_AMBULATORY_CARE_PROVIDER_SITE_OTHER): Payer: Medicare Other

## 2019-06-22 DIAGNOSIS — Z23 Encounter for immunization: Secondary | ICD-10-CM | POA: Diagnosis not present

## 2019-06-23 NOTE — Telephone Encounter (Signed)
I have never seen the patient for this issue, I have never prescribed this medication, this would have to be done in a visit.

## 2019-06-23 NOTE — Telephone Encounter (Signed)
Son aware and states they will just wait until next appt on 11/20

## 2019-06-28 ENCOUNTER — Other Ambulatory Visit: Payer: Self-pay

## 2019-06-28 NOTE — Patient Outreach (Signed)
[place] [facility]) Care Management  06/28/2019  Kristy [name] 08/06/1928 [id number]   Medication Adherence call to Kristy [name] Telephone call to Patient regarding Medication Adherence unable to reach patient.patient is showing past due on Pravastatin 80 mg under [place].   [place] Management Direct Dial [phone]  Fax [phone] [email or web address]

## 2019-07-24 ENCOUNTER — Other Ambulatory Visit: Payer: Self-pay | Admitting: Pharmacist

## 2019-07-24 NOTE — Patient Outreach (Signed)
[place] [facility]) Care Management [place]  07/24/2019  ADREA SHUCK July 06, 1928 [id number]  Reason for referral: MEDICATION MANAGEMENT/RECONCILIATION  [facility] pharmacy case is being closed due to the following reasons:  -Unable to engage or maintain contact with patient    Regina Eck, PharmD, [place]  [phone]

## 2019-08-04 ENCOUNTER — Other Ambulatory Visit: Payer: Self-pay | Admitting: Family Medicine

## 2019-08-19 ENCOUNTER — Other Ambulatory Visit: Payer: Self-pay

## 2019-08-20 ENCOUNTER — Ambulatory Visit (INDEPENDENT_AMBULATORY_CARE_PROVIDER_SITE_OTHER): Payer: Medicare Other | Admitting: Family Medicine

## 2019-08-20 ENCOUNTER — Encounter: Payer: Self-pay | Admitting: Family Medicine

## 2019-08-20 VITALS — BP 157/71 | HR 73 | Temp 98.6°F | Ht 62.0 in | Wt 166.4 lb

## 2019-08-20 DIAGNOSIS — E1121 Type 2 diabetes mellitus with diabetic nephropathy: Secondary | ICD-10-CM | POA: Diagnosis not present

## 2019-08-20 DIAGNOSIS — E1122 Type 2 diabetes mellitus with diabetic chronic kidney disease: Secondary | ICD-10-CM

## 2019-08-20 DIAGNOSIS — I10 Essential (primary) hypertension: Secondary | ICD-10-CM

## 2019-08-20 DIAGNOSIS — E1169 Type 2 diabetes mellitus with other specified complication: Secondary | ICD-10-CM | POA: Diagnosis not present

## 2019-08-20 DIAGNOSIS — G3184 Mild cognitive impairment, so stated: Secondary | ICD-10-CM

## 2019-08-20 DIAGNOSIS — C9110 Chronic lymphocytic leukemia of B-cell type not having achieved remission: Secondary | ICD-10-CM

## 2019-08-20 DIAGNOSIS — E785 Hyperlipidemia, unspecified: Secondary | ICD-10-CM

## 2019-08-20 DIAGNOSIS — E1159 Type 2 diabetes mellitus with other circulatory complications: Secondary | ICD-10-CM

## 2019-08-20 DIAGNOSIS — N183 Chronic kidney disease, stage 3 unspecified: Secondary | ICD-10-CM

## 2019-08-20 DIAGNOSIS — N1831 Chronic kidney disease, stage 3a: Secondary | ICD-10-CM

## 2019-08-20 DIAGNOSIS — I152 Hypertension secondary to endocrine disorders: Secondary | ICD-10-CM

## 2019-08-20 DIAGNOSIS — F341 Dysthymic disorder: Secondary | ICD-10-CM

## 2019-08-20 LAB — BMP8+EGFR
BUN/Creatinine Ratio: 17 (ref 12–28)
BUN: 15 mg/dL (ref 8–27)
CO2: 20 mmol/L (ref 20–29)
Calcium: 9.6 mg/dL (ref 8.7–10.3)
Chloride: 106 mmol/L (ref 96–106)
Creatinine, Ser: 0.86 mg/dL (ref 0.57–1.00)
GFR calc Af Amer: 70 mL/min/{1.73_m2} (ref >59)
GFR calc non Af Amer: 61 mL/min/{1.73_m2} (ref >59)
Glucose: 183 mg/dL — ABNORMAL HIGH (ref 65–99)
Potassium: 5.2 mmol/L (ref 3.5–5.2)
Sodium: 140 mmol/L (ref 134–144)

## 2019-08-20 LAB — BAYER DCA HB A1C WAIVED: HB A1C (BAYER DCA - WAIVED): 6.2 % (ref <7.0)

## 2019-08-20 MED ORDER — FUROSEMIDE 40 MG PO TABS: 40.0000 mg | ORAL_TABLET | Freq: Every day | ORAL | 3 refills | Status: DC | PRN

## 2019-08-20 MED ORDER — GLIMEPIRIDE 4 MG PO TABS: 4.0000 mg | ORAL_TABLET | Freq: Every day | ORAL | 3 refills | Status: DC

## 2019-08-20 MED ORDER — METFORMIN HCL ER 500 MG PO TB24: 1000.0000 mg | ORAL_TABLET | Freq: Every day | ORAL | 3 refills | Status: DC

## 2019-08-20 MED ORDER — ALPRAZOLAM 0.25 MG PO TABS: 0.2500 mg | ORAL_TABLET | Freq: Every evening | ORAL | 2 refills | Status: DC | PRN

## 2019-08-20 MED ORDER — METOPROLOL SUCCINATE ER 25 MG PO TB24: 25.0000 mg | ORAL_TABLET | Freq: Every day | ORAL | 3 refills | Status: DC

## 2019-08-20 MED ORDER — LISINOPRIL 10 MG PO TABS: ORAL_TABLET | ORAL | 3 refills | Status: DC

## 2019-08-20 MED ORDER — PRAVASTATIN SODIUM 80 MG PO TABS: ORAL_TABLET | ORAL | 3 refills | Status: DC

## 2019-08-20 MED ORDER — EZETIMIBE 10 MG PO TABS: 10.0000 mg | ORAL_TABLET | Freq: Every day | ORAL | 3 refills | Status: DC

## 2019-08-20 NOTE — Progress Notes (Signed)
BP (!) 157/71   Pulse 73   Temp 98.6 F (37 C) (Temporal)   Ht '5\' 2"'  (1.575 m)   Wt 166 lb 6.4 oz (75.5 kg)   SpO2 97%   BMI 30.43 kg/m    Subjective:   Patient ID: Kristy [name], female    DOB: 23-Oct-1927, 84 y.o.   MRN: [id number]  HPI: Kristy [name] is a 85 y.o. female presenting on 08/20/2019 for Diabetes (3 month follow up)   HPI Type 2 diabetes mellitus Patient comes in today for recheck of his diabetes. Patient has been currently taking glimepiride and Metformin. Patient is currently on an ACE inhibitor/ARB. Patient has not seen an ophthalmologist this year. Patient denies any issues with their feet.  Patient has CKD stage III  Hypertension Patient is currently on lisinopril and metoprolol and Lasix, and their blood pressure today is 151/71. Patient denies any lightheadedness or dizziness. Patient denies headaches, blurred vision, chest pains, shortness of breath, or weakness. Denies any side effects from medication and is content with current medication.   Hyperlipidemia Patient is coming in for recheck of his hyperlipidemia. The patient is currently taking Zetia and pravastatin. They deny any issues with myalgias or history of liver damage from it. They deny any focal numbness or weakness or chest pain.   Anxiety and depression Patient is coming in for anxiety recheck.  She is currently using Xanax 0.25 as needed. Current rx-Xanax 0.25 daily as needed # meds rx- 30 Effectiveness of current meds-works well Adverse reactions form meds-none  Pill count performed-No Last drug screen -N/A ( high risk q37m moderate risk q646mlow risk yearly ) Urine drug screen today- Yes Was the NCPaysoneviewed-yes  If yes were their any concerning findings? -None  No flowsheet data found.   Controlled substance contract signed on: Today  Patient has been diagnosed with mild cognitive impairment and per family its not necessarily worsening but about the same.  Patient has been  diagnosed with CLL is being monitored by hematologist  Relevant past medical, surgical, family and social history reviewed and updated as indicated. Interim medical history since our last visit reviewed. Allergies and medications reviewed and updated.  Review of Systems  Constitutional: Negative for chills and fever.  Eyes: Negative for redness and visual disturbance.  Respiratory: Negative for chest tightness and shortness of breath.   Cardiovascular: Negative for chest pain and leg swelling.  Musculoskeletal: Negative for back pain and gait problem.  Skin: Negative for rash.  Neurological: Negative for light-headedness and headaches.  Psychiatric/Behavioral: Positive for confusion. Negative for agitation, behavioral problems, dysphoric mood, self-injury, sleep disturbance and suicidal ideas. The patient is nervous/anxious.   All other systems reviewed and are negative.   Per HPI unless specifically indicated above   Allergies as of 08/20/2019      Reactions   Vioxx [rofecoxib] Other (See Comments)   Ulcers in mouth      Medication List       Accurate as of August 20, 2019  9:40 AM. If you have any questions, ask your nurse or doctor.        ALPRAZolam 0.25 MG tablet Commonly known as: XANAX Take 0.25 mg by mouth at bedtime as needed for anxiety. Take 1/2 tablet by mouth BID and 1 at bedtime   cholecalciferol 1000 units tablet Commonly known as: VITAMIN D Take 1,000 Units by mouth daily.   ezetimibe 10 MG tablet Commonly known as: ZETIA TAKE 1 TABLET BY MOUTH  EVERY DAY   furosemide 40 MG tablet Commonly known as: LASIX Take 40 mg by mouth daily as needed for fluid.   glimepiride 4 MG tablet Commonly known as: AMARYL TAKE 1 TABLET BY MOUTH DAILY WITH BREAKFAST   glucose blood test strip Commonly known as: FREESTYLE TEST STRIPS Check Blood sugar two times daily   glucose blood test strip Commonly known as: ONE TOUCH ULTRA TEST CHECK BLOOD SUGAR TWO TIMES  DAILY   hydroxypropyl methylcellulose / hypromellose 2.5 % ophthalmic solution Commonly known as: ISOPTO TEARS / GONIOVISC Place 1 drop into both eyes 2 (two) times daily as needed (dry eyes).   lisinopril 10 MG tablet Commonly known as: ZESTRIL TAKE 1/2 TABLET BY MOUTH DAILY AS DIRECTED   Lumigan 0.01 % Soln Generic drug: bimatoprost   metFORMIN 500 MG 24 hr tablet Commonly known as: GLUCOPHAGE-XR Take 2 tablets (1,000 mg total) by mouth 2 (two) times a day.   metoprolol succinate 25 MG 24 hr tablet Commonly known as: TOPROL-XL TAKE 1 TABLET BY MOUTH EVERY DAY   onetouch ultrasoft lancets Check blood sugars twice a day   pravastatin 80 MG tablet Commonly known as: PRAVACHOL TAKE 1 TABLET BY MOUTH EVERYDAY AT BEDTIME   PRESERVISION AREDS 2 PO Take by mouth.        Objective:   BP (!) 157/71   Pulse 73   Temp 98.6 F (37 C) (Temporal)   Ht '5\' 2"'  (1.575 m)   Wt 166 lb 6.4 oz (75.5 kg)   SpO2 97%   BMI 30.43 kg/m   Wt Readings from Last 3 Encounters:  08/20/19 166 lb 6.4 oz (75.5 kg)  05/20/19 168 lb 9.6 oz (76.5 kg)  04/21/19 164 lb 3.2 oz (74.5 kg)    Physical Exam Vitals signs and nursing note reviewed.  Constitutional:      General: She is not in acute distress.    Appearance: She is well-developed. She is not diaphoretic.  Eyes:     Conjunctiva/sclera: Conjunctivae normal.  Cardiovascular:     Rate and Rhythm: Normal rate and regular rhythm.     Heart sounds: Normal heart sounds. No murmur.  Pulmonary:     Effort: Pulmonary effort is normal. No respiratory distress.     Breath sounds: Normal breath sounds. No wheezing.  Musculoskeletal: Normal range of motion.        General: No tenderness.  Skin:    General: Skin is warm and dry.     Findings: No rash.  Neurological:     Mental Status: She is alert and oriented to person, place, and time.     Coordination: Coordination normal.  Psychiatric:        Behavior: Behavior normal.      Assessment & Plan:   Problem List Items Addressed This Visit      Cardiovascular and Mediastinum   Hypertension associated with diabetes ([place])   Relevant Medications   furosemide (LASIX) 40 MG tablet   ezetimibe (ZETIA) 10 MG tablet   glimepiride (AMARYL) 4 MG tablet   lisinopril (ZESTRIL) 10 MG tablet   metFORMIN (GLUCOPHAGE-XR) 500 MG 24 hr tablet   metoprolol succinate (TOPROL-XL) 25 MG 24 hr tablet   pravastatin (PRAVACHOL) 80 MG tablet     Endocrine   Hyperlipidemia associated with type 2 diabetes mellitus (HCC)   Relevant Medications   ezetimibe (ZETIA) 10 MG tablet   glimepiride (AMARYL) 4 MG tablet   lisinopril (ZESTRIL) 10 MG tablet   metFORMIN (GLUCOPHAGE-XR)  500 MG 24 hr tablet   pravastatin (PRAVACHOL) 80 MG tablet   Diabetes mellitus type 2 controlled - Primary   Relevant Medications   glimepiride (AMARYL) 4 MG tablet   lisinopril (ZESTRIL) 10 MG tablet   metFORMIN (GLUCOPHAGE-XR) 500 MG 24 hr tablet   pravastatin (PRAVACHOL) 80 MG tablet   Other Relevant Orders   hgba1c (Completed)   BMP8+EGFR (Completed)   Type 2 diabetes mellitus with stage 3 chronic kidney disease (HCC)   Relevant Medications   glimepiride (AMARYL) 4 MG tablet   lisinopril (ZESTRIL) 10 MG tablet   metFORMIN (GLUCOPHAGE-XR) 500 MG 24 hr tablet   pravastatin (PRAVACHOL) 80 MG tablet   Other Relevant Orders   hgba1c (Completed)   BMP8+EGFR (Completed)     Other   CLL (chronic lymphocytic leukemia) (HCC)   Relevant Medications   furosemide (LASIX) 40 MG tablet   ALPRAZolam (XANAX) 0.25 MG tablet   Depression   Relevant Medications   ALPRAZolam (XANAX) 0.25 MG tablet   Other Relevant Orders   DRUG SCREEN-TOXASSURE   Mild cognitive impairment      Continue patient current medication, no changes, it seems like she is doing well, allow permissive hypertension.  Follow up plan: Return in about 3 months (around 11/20/2019), or if symptoms worsen or fail to improve, for diabetes.   Counseling provided for all of the vaccine components Orders Placed This Encounter  Procedures  . hgba1c    Caryl Pina, MD [facility] 08/20/2019, 9:40 AM

## 2019-08-25 LAB — TOXASSURE SELECT 13 (MW), URINE

## 2019-08-30 ENCOUNTER — Telehealth: Payer: Self-pay | Admitting: Family Medicine

## 2019-08-30 NOTE — Chronic Care Management (AMB) (Signed)
°  Care Management   Note  08/30/2019 Name: [name] MRN: [id number] DOB: 1928/06/18  Kristy [name] is a 85 y.o. year old female who is a primary care patient of Dettinger, Fransisca Kaufmann, MD and is actively engaged with the care management team. I reached out to [name] by phone today to assist with scheduling a follow up appointment with the RN Case Manager  Follow up plan: Telephone appointment with CCM team member scheduled for: 09/17/2019  [place], [place] Management  [place], [place] [address] Direct Dial: [email or web address]  Website: [email or web address]

## 2019-09-17 ENCOUNTER — Ambulatory Visit: Payer: Medicare Other | Admitting: *Deleted

## 2019-09-17 DIAGNOSIS — E1122 Type 2 diabetes mellitus with diabetic chronic kidney disease: Secondary | ICD-10-CM

## 2019-09-17 DIAGNOSIS — H353114 Nonexudative age-related macular degeneration, right eye, advanced atrophic with subfoveal involvement: Secondary | ICD-10-CM

## 2019-09-17 DIAGNOSIS — D638 Anemia in other chronic diseases classified elsewhere: Secondary | ICD-10-CM

## 2019-09-17 NOTE — Chronic Care Management (AMB) (Signed)
  Chronic Care Management   Follow-up Note  09/17/2019 Name: Kristy [name] MRN: [id number] DOB: Feb 24, 1928  Referred by: Dettinger, Fransisca Kaufmann, MD Reason for referral : Chronic Care Management (RN follow up)    An unsuccessful telephone outreach was attempted today. The patient was referred to the case management team by for assistance with care management and care coordination.   Follow Up Plan: A HIPPA compliant phone message was left for the patient providing contact information and requesting a return call.  The care management team will reach out to the patient again over the next 30 days.   Chong Sicilian, BSN, RN-BC Embedded Chronic Care Manager [facility] / [place] Management Direct Dial: [phone]

## 2019-09-17 NOTE — Patient Instructions (Signed)
CCM team will f/u by telephone over the next 30 days.  Chong Sicilian, BSN, RN-BC Embedded Chronic Care Manager [facility] / [place] Management Direct Dial: [phone]

## 2019-10-25 ENCOUNTER — Ambulatory Visit: Payer: Medicare Other | Admitting: Neurology

## 2019-10-30 ENCOUNTER — Other Ambulatory Visit: Payer: Self-pay | Admitting: Family Medicine

## 2019-11-22 ENCOUNTER — Ambulatory Visit: Payer: Medicare Other | Admitting: Family Medicine

## 2019-12-01 ENCOUNTER — Other Ambulatory Visit: Payer: Self-pay | Admitting: Family Medicine

## 2019-12-06 ENCOUNTER — Telehealth: Payer: Self-pay | Admitting: Family Medicine

## 2019-12-06 NOTE — Telephone Encounter (Signed)
  Medication Request  12/06/2019  What is the name of the medication?  Alprazolam  Have you contacted your pharmacy to request a refill?  Yes  Which pharmacy would you like this sent to?  [facility] [place]  Pt has appt scheduled on 12/23/19 but will run out of this medicine in the next day or two. Requested that Dr Dettinger send her enough to last her until she sees him.   Patient notified that their request is being sent to the clinical staff for review and that they should receive a call once it is complete. If they do not receive a call within 24 hours they can check with their pharmacy or our office.

## 2019-12-06 NOTE — Telephone Encounter (Signed)
Patient signed a contract 3 months ago that said she would not call to get these outside of a visit.Marland Kitchen  She was supposed to be seen around the end of February

## 2019-12-07 NOTE — Telephone Encounter (Signed)
Unfortunately this is policy that we have to follow, she will need to be seen for a visit for all refills we discussed this and the importance of making appointments at your previous visit, this is office policy, this is not just from me.

## 2019-12-07 NOTE — Telephone Encounter (Signed)
This appt was made bu nurse in February this is when she called and could not get in till 03/25

## 2019-12-08 ENCOUNTER — Telehealth: Payer: Self-pay | Admitting: Family Medicine

## 2019-12-08 NOTE — Telephone Encounter (Signed)
Spoke with Ronalee Belts and he will call us back to schedule a sooner appointment.

## 2019-12-08 NOTE — Telephone Encounter (Signed)
Appointment moved to 03/12 with Dettinger at 3:55pm

## 2019-12-10 ENCOUNTER — Telehealth: Payer: Self-pay | Admitting: Family Medicine

## 2019-12-10 ENCOUNTER — Encounter: Payer: Self-pay | Admitting: Family Medicine

## 2019-12-10 ENCOUNTER — Telehealth (INDEPENDENT_AMBULATORY_CARE_PROVIDER_SITE_OTHER): Payer: Medicare Other | Admitting: Family Medicine

## 2019-12-10 DIAGNOSIS — N1831 Chronic kidney disease, stage 3a: Secondary | ICD-10-CM

## 2019-12-10 DIAGNOSIS — F341 Dysthymic disorder: Secondary | ICD-10-CM

## 2019-12-10 DIAGNOSIS — E1169 Type 2 diabetes mellitus with other specified complication: Secondary | ICD-10-CM

## 2019-12-10 DIAGNOSIS — E785 Hyperlipidemia, unspecified: Secondary | ICD-10-CM

## 2019-12-10 DIAGNOSIS — E1121 Type 2 diabetes mellitus with diabetic nephropathy: Secondary | ICD-10-CM | POA: Diagnosis not present

## 2019-12-10 DIAGNOSIS — I10 Essential (primary) hypertension: Secondary | ICD-10-CM

## 2019-12-10 DIAGNOSIS — I152 Hypertension secondary to endocrine disorders: Secondary | ICD-10-CM

## 2019-12-10 DIAGNOSIS — E1159 Type 2 diabetes mellitus with other circulatory complications: Secondary | ICD-10-CM

## 2019-12-10 MED ORDER — ALPRAZOLAM 0.25 MG PO TABS: 0.2500 mg | ORAL_TABLET | Freq: Every evening | ORAL | 2 refills | Status: DC | PRN

## 2019-12-10 MED ORDER — METOPROLOL SUCCINATE ER 25 MG PO TB24: 25.0000 mg | ORAL_TABLET | Freq: Every day | ORAL | 3 refills | Status: DC

## 2019-12-10 MED ORDER — BUSPIRONE HCL 30 MG PO TABS: 30.0000 mg | ORAL_TABLET | Freq: Every day | ORAL | 3 refills | Status: DC

## 2019-12-10 NOTE — Progress Notes (Signed)
Virtual Visit via mychart video Note  I connected with Kristy [name] on 12/10/19 at 1612 by video and verified that I am speaking with the correct person using two identifiers. Kristy [name] is currently located at home and son Ronalee Belts are currently with her during visit. The provider, Fransisca Kaufmann Dettinger, MD is located in their office at time of visit.  Call ended at 1625  I discussed the limitations, risks, security and privacy concerns of performing an evaluation and management service by video and the availability of in person appointments. I also discussed with the patient that there may be a patient responsible charge related to this service. The patient expressed understanding and agreed to proceed.   History and Present Illness: Depression Current rx-Xanax 0.25 mg, half tablet twice daily as needed # meds rx-30 Effectiveness of current meds-working well Adverse reactions form meds-none  Pill count performed-No Last drug screen -08/27/2019 ( high risk q41m, moderate risk q19m, low risk yearly ) Urine drug screen today- No, drug screen shows it was not present in her system but she had not had it just prior to the drug screen.  She had been out. Was the [place] reviewed-yes  If yes were their any concerning findings? -None  No flowsheet data found.   Controlled substance contract signed on: 11 27,2020  Type 2 diabetes mellitus Patient comes in today for recheck of his diabetes. Patient has been currently taking metformin and glimiperide and BS can get to 360-200. Patient is currently on an ACE inhibitor/ARB. Patient has not seen an ophthalmologist this year. Patient denies any issues with their feet.   Hypertension Patient is currently on lisinopril, and their blood pressure today is unknown. Patient denies any lightheadedness or dizziness. Patient denies headaches, blurred vision, chest pains, shortness of breath, or weakness. Denies any side effects from medication and is  content with current medication.   Hyperlipidemia Patient is coming in for recheck of his hyperlipidemia. The patient is currently taking zetia and pravastatin. They deny any issues with myalgias or history of liver damage from it. They deny any focal numbness or weakness or chest pain.     Outpatient Encounter Medications as of 12/10/2019  Medication Sig  . ALPRAZolam (XANAX) 0.25 MG tablet Take 1 tablet (0.25 mg total) by mouth at bedtime as needed for anxiety. Take 1/2 tablet by mouth BID and 1 at bedtime  . busPIRone (BUSPAR) 30 MG tablet Take 1 tablet (30 mg total) by mouth at bedtime.  . cholecalciferol (VITAMIN D) 1000 UNITS tablet Take 1,000 Units by mouth daily.  Marland Kitchen ezetimibe (ZETIA) 10 MG tablet Take 1 tablet (10 mg total) by mouth daily.  Marland Kitchen glimepiride (AMARYL) 4 MG tablet Take 1 tablet (4 mg total) by mouth daily with breakfast.  . glucose blood (FREESTYLE TEST STRIPS) test strip Check Blood sugar two times daily  . glucose blood (ONE TOUCH ULTRA TEST) test strip CHECK BLOOD SUGAR TWO TIMES DAILY  . hydroxypropyl methylcellulose (ISOPTO TEARS) 2.5 % ophthalmic solution Place 1 drop into both eyes 2 (two) times daily as needed (dry eyes).  . Lancets (ONETOUCH ULTRASOFT) lancets Check blood sugars twice a day  . lisinopril (ZESTRIL) 10 MG tablet TAKE 1/2 TABLET BY MOUTH DAILY AS DIRECTED  . LUMIGAN 0.01 % SOLN   . metFORMIN (GLUCOPHAGE-XR) 500 MG 24 hr tablet Take 2 tablets (1,000 mg total) by mouth daily with breakfast.  . metoprolol succinate (TOPROL-XL) 25 MG 24 hr tablet Take 1 tablet (25  mg total) by mouth daily.  . Multiple Vitamins-Minerals (PRESERVISION AREDS 2 PO) Take by mouth.  . pravastatin (PRAVACHOL) 80 MG tablet TAKE 1 TABLET BY MOUTH EVERYDAY AT BEDTIME  . [DISCONTINUED] ALPRAZolam (XANAX) 0.25 MG tablet Take 1 tablet (0.25 mg total) by mouth at bedtime as needed for anxiety. Take 1/2 tablet by mouth BID and 1 at bedtime  . [DISCONTINUED] furosemide (LASIX) 40 MG  tablet Take 1 tablet (40 mg total) by mouth daily as needed for fluid.  . [DISCONTINUED] metoprolol succinate (TOPROL-XL) 25 MG 24 hr tablet Take 1 tablet (25 mg total) by mouth daily.   No facility-administered encounter medications on file as of 12/10/2019.    Review of Systems  Constitutional: Negative for chills and fever.  Eyes: Negative for visual disturbance.  Respiratory: Negative for chest tightness and shortness of breath.   Cardiovascular: Negative for chest pain and leg swelling.  Genitourinary: Negative for difficulty urinating and dysuria.  Musculoskeletal: Negative for back pain and gait problem.  Skin: Negative for rash.  Neurological: Negative for light-headedness and headaches.  Psychiatric/Behavioral: Positive for confusion. Negative for agitation, behavioral problems, self-injury, sleep disturbance and suicidal ideas.  All other systems reviewed and are negative.   Observations/Objective: Patient looks and sounds comfortable and in no acute distress  Assessment and Plan: Problem List Items Addressed This Visit      Cardiovascular and Mediastinum   Hypertension associated with diabetes ([place])   Relevant Medications   metoprolol succinate (TOPROL-XL) 25 MG 24 hr tablet     Endocrine   Hyperlipidemia associated with type 2 diabetes mellitus ([place])   Diabetes mellitus type 2 controlled   Type 2 diabetes mellitus with stage 3 chronic kidney disease ([place]) - Primary     Other   Depression   Relevant Medications   ALPRAZolam (XANAX) 0.25 MG tablet   busPIRone (BUSPAR) 30 MG tablet      Continue current medications, they are trying to reduce the medication slowly and working with neurology to do this. Follow up plan: Return in about 3 months (around 03/11/2020), or if symptoms worsen or fail to improve, for depression and diabetes.     I discussed the assessment and treatment plan with the patient. The patient was provided an opportunity to ask questions and all  were answered. The patient agreed with the plan and demonstrated an understanding of the instructions.   The patient was advised to call back or seek an in-person evaluation if the symptoms worsen or if the condition fails to improve as anticipated.  The above assessment and management plan was discussed with the patient. The patient verbalized understanding of and has agreed to the management plan. Patient is aware to call the clinic if symptoms persist or worsen. Patient is aware when to return to the clinic for a follow-up visit. Patient educated on when it is appropriate to go to the emergency department.    I provided 13 minutes of non-face-to-face time during this encounter.    Worthy Rancher, MD

## 2019-12-10 NOTE — Telephone Encounter (Signed)
[facility] called and needs Dr Dettinger to confirm which directions pt needs to use for Xanax Rx that was recently sent over. Says there are 2 different directions listed.

## 2019-12-13 MED ORDER — ALPRAZOLAM 0.25 MG PO TABS: 0.2500 mg | ORAL_TABLET | Freq: Every evening | ORAL | 2 refills | Status: DC | PRN

## 2019-12-13 NOTE — Telephone Encounter (Signed)
Recent corrected prescription for her

## 2019-12-13 NOTE — Telephone Encounter (Signed)
Please review xanax script and simplify directions.

## 2019-12-23 ENCOUNTER — Ambulatory Visit: Payer: Medicare Other | Admitting: Family Medicine

## 2020-01-11 NOTE — Progress Notes (Signed)
PATIENT: Kristy [name] DOB: 1928-06-11  REASON FOR VISIT: follow up HISTORY FROM: patient  HISTORY OF PRESENT ILLNESS: Today 01/12/20  Kristy [name] is an 86 year old female with history of transient alteration of awareness.  She has significant memory issue and impairment of her vision with severe macular degeneration.  She has previously been on Keppra and Depakote but had side effect.  She had normal EEG in September 2019.  She is currently off medication, has had no further events of staring off or aphasia.  She lives with her husband, gets very upset if her husband leaves and worries.  She pretty much goes everywhere with him.  The memory has declined since last seen.  Her son has noticed more repetitive questioning.  Has had more frequent bathroom accidents.  Is requiring more assistance with ADLs, she does fold clothes.  They have a family friend who will come into the home 3 days a weeks [name] help with ADL's.  Denies recent fall.  Sleeps well, has a good appetite.  Refuses [name] use a walker.  She is hard of hearing, has one hearing aid.  She presents today for evaluation accompanied by her son.  HISTORY 04/21/2019 SS: Kristy [name] is an 86 year old female with history of events of transient alteration of awareness.  She also has a significant memory issue and impairment of her vision, with severe macular degeneration.  While on the [place], she did not have recurrence of events, her dose was reduced [name] due [name] side effect of drowsiness.  She began [name] have some paranoid behavior while on the Keppra, she was transitioned [name] Depakote.  Her Depakote was discontinued in April 2020 due [name] problems not recognizing her husband.  She had normal EEG in September 2019.  Today, she presents for follow-up and reports she has not had any further events of staring off or aphasia.  Her son reports she is not as lethargic and has been doing well off medication.  She currently lives with her husband, she does not drive a  car.  She is able [name] perform her ADLs.  She does not do much cooking, due [name] vision problems.  She says about 3 months ago she had a fall, broke 3 ribs, since then she has been encouraged [name] use a walker.  Her son indicates that her memory is stable, they have not noticed any decline.  She presents today for follow-up accompanied by her son.   REVIEW OF SYSTEMS: Out of a complete 14 system review of symptoms, the patient complains only of the following symptoms, and all other reviewed systems are negative.  Memory loss  ALLERGIES: Allergies  Allergen Reactions  . Vioxx [Rofecoxib] Other (See Comments)    Ulcers in mouth     HOME MEDICATIONS: Outpatient Medications Prior [name] Visit  Medication Sig Dispense Refill  . ALPRAZolam (XANAX) 0.25 MG tablet Take 1 tablet (0.25 mg total) by mouth at bedtime as needed for anxiety. 30 tablet 2  . busPIRone (BUSPAR) 30 MG tablet Take 1 tablet (30 mg total) by mouth at bedtime. 90 tablet 3  . cholecalciferol (VITAMIN D) 1000 UNITS tablet Take 1,000 Units by mouth daily.    Marland Kitchen ezetimibe (ZETIA) 10 MG tablet Take 1 tablet (10 mg total) by mouth daily. 90 tablet 3  . glimepiride (AMARYL) 4 MG tablet Take 1 tablet (4 mg total) by mouth daily with breakfast. 90 tablet 3  . glucose blood (FREESTYLE TEST STRIPS) test strip Check Blood sugar  two times daily 300 each 2  . glucose blood (ONE TOUCH ULTRA TEST) test strip CHECK BLOOD SUGAR TWO TIMES DAILY 100 each 8  . hydroxypropyl methylcellulose (ISOPTO TEARS) 2.5 % ophthalmic solution Place 1 drop into both eyes 2 (two) times daily as needed (dry eyes).    . Lancets (ONETOUCH ULTRASOFT) lancets Check blood sugars twice a day 200 each 3  . lisinopril (ZESTRIL) 10 MG tablet TAKE 1/2 TABLET BY MOUTH DAILY AS DIRECTED 45 tablet 3  . LUMIGAN 0.01 % SOLN     . metFORMIN (GLUCOPHAGE-XR) 500 MG 24 hr tablet Take 2 tablets (1,000 mg total) by mouth daily with breakfast. 360 tablet 3  . metoprolol succinate (TOPROL-XL) 25  MG 24 hr tablet Take 1 tablet (25 mg total) by mouth daily. 90 tablet 3  . Multiple Vitamins-Minerals (PRESERVISION AREDS 2 PO) Take by mouth.    . pravastatin (PRAVACHOL) 80 MG tablet TAKE 1 TABLET BY MOUTH EVERYDAY AT BEDTIME 90 tablet 3   No facility-administered medications prior [name] visit.    PAST MEDICAL HISTORY: Past Medical History:  Diagnosis Date  . Anxiety   . Colon polyps   . Complication of anesthesia 01-18-2013   slow [name] awaken and felt crazy for 3-4 days  . Diabetes mellitus   . Diverticulitis   . H/O hiatal hernia   . Hearing loss 01-07-13   bilateral hearing aids  . History of shingles 01-07-13   3'14 recent outbreak- drying in   . HOH (hard of hearing) 02/05/2018  . Hypercholesteremia   . Hypertension    Dr. Laurance Flatten  . Lumbar spondylosis    osteoarthritis-knees  . Macular degeneration of both eyes    and dry eyes  . Stroke [facility])    TIA    PAST SURGICAL HISTORY: Past Surgical History:  Procedure Laterality Date  . ABDOMINAL HYSTERECTOMY  1987  . APPENDECTOMY     pt thinks this was removed when gall bladder was taken out  . CATARACT EXTRACTION    . CHOLECYSTECTOMY  1989   open  . KNEE ARTHROSCOPY  01-07-13   left knee- many yrs ago  . Lumbar back surgery  2013  . TOTAL KNEE ARTHROPLASTY Left 01/18/2013   Procedure: TOTAL KNEE ARTHROPLASTY;  Surgeon: Gearlean Alf, MD;  Location: [facility];  Service: Orthopedics;  Laterality: Left;  . TOTAL KNEE ARTHROPLASTY Right 03/07/2014   Procedure: RIGHT TOTAL KNEE ARTHROPLASTY;  Surgeon: Gearlean Alf, MD;  Location: [facility];  Service: Orthopedics;  Laterality: Right;  . VENTRAL HERNIA REPAIR      FAMILY HISTORY: Family History  Problem Relation Age of Onset  . Stroke Father 67  . Heart disease Father   . Irregular heart beat Father        pacemaker   . Healthy Mother   . Healthy Son   . Heart disease Sister        unknown heart condition   . Bipolar disorder Son   . COPD Son   . Cancer Sister        colon     SOCIAL HISTORY: Social History   Socioeconomic History  . Marital status: Married    Spouse name: [name]   . Number of children: 2  . Years of education: Not on file  . Highest education level: Not on file  Occupational History  . Occupation: retired    Comment: Science writer   Tobacco Use  . Smoking status: Never Smoker  . Smokeless tobacco:  Never Used  Substance and Sexual Activity  . Alcohol use: No    Alcohol/week: 0.0 standard drinks  . Drug use: No  . Sexual activity: Not Currently  Other Topics Concern  . Not on file  Social History Narrative   Lives with husband   Social Determinants of Health   Financial Resource Strain:   . Difficulty of Paying Living Expenses:   Food Insecurity:   . Worried About Charity fundraiser in the Last Year:   . Arboriculturist in the Last Year:   Transportation Needs:   . Film/video editor (Medical):   Marland Kitchen Lack of Transportation (Non-Medical):   Physical Activity:   . Days of Exercise per Week:   . Minutes of Exercise per Session:   Stress:   . Feeling of Stress :   Social Connections:   . Frequency of Communication with Friends and Family:   . Frequency of Social Gatherings with Friends and Family:   . Attends Religious Services:   . Active Member of Clubs or Organizations:   . Attends Archivist Meetings:   Marland Kitchen Marital Status:   Intimate Partner Violence:   . Fear of Current or Ex-Partner:   . Emotionally Abused:   Marland Kitchen Physically Abused:   . Sexually Abused:       PHYSICAL EXAM  Vitals:   01/12/20 0836  BP: 127/62  Pulse: 82  Temp: 97.7 F (36.5 C)  Weight: 171 lb 6.4 oz (77.7 kg)  Height: 5\' 2"  (1.575 m)   Body mass index is 31.35 kg/m.  Generalized: Well developed, in no acute distress  MMSE - Mini Mental State Exam 01/12/2020 04/21/2019 10/19/2018  Not completed: - - -  Orientation [name] time 3 4 2   Orientation [name] Place 1 3 4   Registration 3 3 3   Attention/ Calculation 0 5 5  Recall 1 2 3    Language- name 2 objects 2 2 2   Language- repeat 0 1 0  Language- follow 3 step command 3 3 3   Language- read & follow direction 0 1 0  Language-read & follow direction-comments - Cannot see -  Write a sentence 0 1 0  Write a sentence-comments - cannot see -  Copy design 0 1 0  Copy design-comments - cannot see. Named 5 animals -  Total score 13 26 22     Neurological examination  Mentation: Alert, most history is provided by her son, patient is very hard of hearing, significant visual impairment, limiting ability [name] follow exam commands, is smiling, cooperative Cranial nerve II-XII: Pupils were equal round reactive [name] light. Extraocular movements were full, visual field were full on confrontational test. Facial sensation and strength were normal.  Head turning and shoulder shrug were normal and symmetric. Motor: Good strength of all extremities Sensory: Sensory testing is intact [name] soft touch on all 4 extremities. No evidence of extinction is noted.  Coordination: mild dysmetria with finger-nose-finger Gait and station: Has [name] push off from seated position, slow [name] rise, gait is wide-based, cautious. Reflexes: Deep tendon reflexes are symmetric but depressed bilaterally  DIAGNOSTIC DATA (LABS, IMAGING, TESTING) - I reviewed patient records, labs, notes, testing and imaging myself where available.  Lab Results  Component Value Date   WBC 15.4 (H) 05/20/2019   HGB 12.4 05/20/2019   HCT 37.2 05/20/2019   MCV 92 05/20/2019   PLT 177 05/20/2019      Component Value Date/Time   NA 140 08/20/2019 1035   NA  141 11/16/2014 0852   K 5.2 08/20/2019 1035   K 4.5 11/16/2014 0852   CL 106 08/20/2019 1035   CL 105 06/17/2012 0845   CO2 20 08/20/2019 1035   CO2 24 11/16/2014 0852   GLUCOSE 183 (H) 08/20/2019 1035   GLUCOSE 215 (H) 06/30/2018 1633   GLUCOSE 242 (H) 11/16/2014 0852   GLUCOSE 269 (H) 06/17/2012 0845   BUN 15 08/20/2019 1035   BUN 16.6 11/16/2014 0852   CREATININE 0.86  08/20/2019 1035   CREATININE 1.1 11/16/2014 0852   CALCIUM 9.6 08/20/2019 1035   CALCIUM 9.5 11/16/2014 0852   PROT 6.3 03/03/2019 1030   PROT 6.0 (L) 11/16/2014 0852   ALBUMIN 4.3 03/03/2019 1030   ALBUMIN 3.5 11/16/2014 0852   AST 29 03/03/2019 1030   AST 11 11/16/2014 0852   ALT 18 03/03/2019 1030   ALT 9 11/16/2014 0852   ALKPHOS 65 03/03/2019 1030   ALKPHOS 36 (L) 11/16/2014 0852   BILITOT 0.4 03/03/2019 1030   BILITOT 0.49 11/16/2014 0852   GFRNONAA 61 08/20/2019 1035   GFRAA 70 08/20/2019 1035   Lab Results  Component Value Date   CHOL 125 05/20/2019   HDL 55 05/20/2019   LDLCALC 44 05/20/2019   TRIG 128 05/20/2019   CHOLHDL 2.3 05/20/2019   Lab Results  Component Value Date   HGBA1C 6.2 08/20/2019   No results found for: [id number] Lab Results  Component Value Date   TSH 1.050 03/03/2019      ASSESSMENT AND PLAN 87 y.o. year old female  has a past medical history of Anxiety, Colon polyps, Complication of anesthesia (01-18-2013), Diabetes mellitus, Diverticulitis, H/O hiatal hernia, Hearing loss (01-07-13), History of shingles (01-07-13), HOH (hard of hearing) (02/05/2018), Hypercholesteremia, Hypertension, Lumbar spondylosis, Macular degeneration of both eyes, and Stroke ([place]). here with:  1.  Episodes of transient alteration of awareness, possible seizures 2.  Macular degeneration, severe vision impairment 3.  Decreased auditory capacity 4.  Memory disturbance  Patient has had a decline in her memory since last seen.  I will initiate Namenda slow titration [name] 10 mg twice a day.  I will hold Aricept, due [name] possibility of seizures.  She has not had recurrent seizure, we are monitoring conservatively.  I have encouraged her son, [name] plan for her future care, as she will likely require more supervision and assistance.  She will follow-up in 6 months or sooner if needed.  I spent 30 minutes of face-[name]-face and non-face-[name]-face time with patient.  This included previsit  chart review, lab review, study review, order entry, electronic health record documentation, patient education.    Butler Denmark, AGNP-C, DNP 01/12/2020, 8:48 AM [facility] [address], [place], [place] [address] [phone]

## 2020-01-12 ENCOUNTER — Encounter: Payer: Self-pay | Admitting: Neurology

## 2020-01-12 ENCOUNTER — Ambulatory Visit: Payer: Medicare Other | Admitting: Neurology

## 2020-01-12 ENCOUNTER — Other Ambulatory Visit: Payer: Self-pay

## 2020-01-12 VITALS — BP 127/62 | HR 82 | Temp 97.7°F | Ht 62.0 in | Wt 171.4 lb

## 2020-01-12 DIAGNOSIS — R404 Transient alteration of awareness: Secondary | ICD-10-CM

## 2020-01-12 DIAGNOSIS — F03918 Unspecified dementia, unspecified severity, with other behavioral disturbance: Secondary | ICD-10-CM | POA: Insufficient documentation

## 2020-01-12 DIAGNOSIS — F0390 Unspecified dementia without behavioral disturbance: Secondary | ICD-10-CM

## 2020-01-12 MED ORDER — MEMANTINE HCL 5 MG PO TABS: ORAL_TABLET | ORAL | 0 refills | Status: DC

## 2020-01-12 NOTE — Patient Instructions (Signed)
Let's start Namenda 5 mg tablets   Take 1 tablet daily for one week, then take 1 tablet twice daily for one week, then take 1 tablet in the morning and 2 in the evening for one week, then take 2 tablets twice daily  Once you complete the titration, call me and let me know, will send in the 10 mg tablets to take twice daily   Call for seizure activity   See you back in 6 months   Memantine Tablets What is this medicine? MEMANTINE (MEM an teen) is used to treat dementia caused by Alzheimer's disease. This medicine may be used for other purposes; ask your health care provider or pharmacist if you have questions. COMMON BRAND NAME(S): Namenda What should I tell my health care provider before I take this medicine? They need to know if you have any of these conditions:  difficulty passing urine  kidney disease  liver disease  seizures  an unusual or allergic reaction to memantine, other medicines, foods, dyes, or preservatives  pregnant or trying to get pregnant  breast-feeding How should I use this medicine? Take this medicine by mouth with a glass of water. Follow the directions on the prescription label. You may take this medicine with or without food. Take your doses at regular intervals. Do not take your medicine more often than directed. Continue to take your medicine even if you feel better. Do not stop taking except on the advice of your doctor or health care professional. Talk to your pediatrician regarding the use of this medicine in children. Special care may be needed. Overdosage: If you think you have taken too much of this medicine contact a poison control center or emergency room at once. NOTE: This medicine is only for you. Do not share this medicine with others. What if I miss a dose? If you miss a dose, take it as soon as you can. If it is almost time for your next dose, take only that dose. Do not take double or extra doses. If you do not take your medicine for  several days, contact your health care provider. Your dose may need to be changed. What may interact with this medicine?  acetazolamide  amantadine  cimetidine  dextromethorphan  dofetilide  hydrochlorothiazide  ketamine  metformin  methazolamide  quinidine  ranitidine  sodium bicarbonate  triamterene This list may not describe all possible interactions. Give your health care provider a list of all the medicines, herbs, non-prescription drugs, or dietary supplements you use. Also tell them if you smoke, drink alcohol, or use illegal drugs. Some items may interact with your medicine. What should I watch for while using this medicine? Visit your doctor or health care professional for regular checks on your progress. Check with your doctor or health care professional if there is no improvement in your symptoms or if they get worse. You may get drowsy or dizzy. Do not drive, use machinery, or do anything that needs mental alertness until you know how this drug affects you. Do not stand or sit up quickly, especially if you are an older patient. This reduces the risk of dizzy or fainting spells. Alcohol can make you more drowsy and dizzy. Avoid alcoholic drinks. What side effects may I notice from receiving this medicine? Side effects that you should report to your doctor or health care professional as soon as possible:  allergic reactions like skin rash, itching or hives, swelling of the face, lips, or tongue  agitation or a  feeling of restlessness  depressed mood  dizziness  hallucinations  redness, blistering, peeling or loosening of the skin, including inside the mouth  seizures  vomiting Side effects that usually do not require medical attention (report to your doctor or health care professional if they continue or are bothersome):  constipation  diarrhea  headache  nausea  trouble sleeping This list may not describe all possible side effects. Call your  doctor for medical advice about side effects. You may report side effects to FDA at 1-800-FDA-1088. Where should I keep my medicine? Keep out of the reach of children. Store at room temperature between 15 degrees and 30 degrees C (59 degrees and 86 degrees F). Throw away any unused medicine after the expiration date. NOTE: This sheet is a summary. It may not cover all possible information. If you have questions about this medicine, talk to your doctor, pharmacist, or health care provider.  2020 Elsevier/Gold Standard (2013-07-05 14:10:42)

## 2020-01-12 NOTE — Progress Notes (Signed)
I have read the note, and I agree with the clinical assessment and plan.  Jeffory Snelgrove K Maevyn Riordan   

## 2020-02-19 ENCOUNTER — Other Ambulatory Visit: Payer: Self-pay | Admitting: Neurology

## 2020-02-21 ENCOUNTER — Telehealth: Payer: Self-pay | Admitting: Family Medicine

## 2020-02-21 MED ORDER — METFORMIN HCL ER 500 MG PO TB24: 1000.0000 mg | ORAL_TABLET | Freq: Two times a day (BID) | ORAL | 3 refills | Status: DC

## 2020-02-21 NOTE — Telephone Encounter (Signed)
I never changed it so I do not know why it got changed but she should be taking 2 tablets (1000 mg) twice daily

## 2020-02-21 NOTE — Telephone Encounter (Signed)
Patient should be on Metformin extended release 500 mg 2 tablets twice daily, this adds up to 1000 mg twice daily or 2000 mg a day.

## 2020-02-21 NOTE — Telephone Encounter (Signed)
Last dose in chart is Metformin 500 XR Sig: Take 2 tablets (1,000 mg total) by mouth daily with breakfast.

## 2020-02-21 NOTE — Telephone Encounter (Signed)
Corrected prescription sent in, son aware

## 2020-02-21 NOTE — Telephone Encounter (Signed)
Please verify dosage, pt was taking 1000 mg 1 BID. Last Rx written in November was for 500 mg 2 tabs QD

## 2020-02-21 NOTE — Telephone Encounter (Signed)
Received refill request for pt's memantine titration. called pt, spoke to pt's son Ronalee Belts. Pt started memantine and made it to 5mg  BID but her dizziness worsened so she stopped it and has not restarted memantine and would prefer to not restart it at this time due to the dizziness it caused.   I spoke with Judson Roch, NP and she is aware of this information. Nothing further needed.

## 2020-02-29 ENCOUNTER — Ambulatory Visit: Payer: Medicare Other | Admitting: Neurology

## 2020-03-12 ENCOUNTER — Other Ambulatory Visit: Payer: Self-pay | Admitting: Family Medicine

## 2020-03-12 DIAGNOSIS — F341 Dysthymic disorder: Secondary | ICD-10-CM

## 2020-03-24 ENCOUNTER — Telehealth: Payer: Self-pay | Admitting: Family Medicine

## 2020-03-27 NOTE — Telephone Encounter (Signed)
Son states that her Dementia is getting worse.  Schedule with Dr. Warrick Parisian on 7/9.  Son would like to know if she can get a refill of her xanax until her apt?  Aware of the policy but asked that I ask Dr. Warrick Parisian.

## 2020-03-27 NOTE — Telephone Encounter (Signed)
She definitely needs to be seen ASAP, please get her on the acute schedule, she may be having worsening memory because of an infection and I do not know that they want a wait until July 9.  Also refilling Xanax may make memory and some of the symptoms worse, please make an appointment for her ASAP to make sure she does not have any kind of infection

## 2020-03-28 ENCOUNTER — Telehealth: Payer: Self-pay | Admitting: Family Medicine

## 2020-03-28 NOTE — Telephone Encounter (Signed)
Aware of provider's advice.  He will call back to check for cancellations.   Mom has not complained of any problems that could be related to bladder infection.

## 2020-03-30 ENCOUNTER — Encounter: Payer: Self-pay | Admitting: Family Medicine

## 2020-03-30 ENCOUNTER — Ambulatory Visit (INDEPENDENT_AMBULATORY_CARE_PROVIDER_SITE_OTHER): Payer: Medicare Other | Admitting: Family Medicine

## 2020-03-30 ENCOUNTER — Other Ambulatory Visit: Payer: Self-pay

## 2020-03-30 VITALS — BP 112/63 | HR 83 | Temp 97.0°F | Ht 62.0 in | Wt 166.0 lb

## 2020-03-30 DIAGNOSIS — R413 Other amnesia: Secondary | ICD-10-CM

## 2020-03-30 DIAGNOSIS — E0590 Thyrotoxicosis, unspecified without thyrotoxic crisis or storm: Secondary | ICD-10-CM

## 2020-03-30 DIAGNOSIS — E1121 Type 2 diabetes mellitus with diabetic nephropathy: Secondary | ICD-10-CM

## 2020-03-30 DIAGNOSIS — E1169 Type 2 diabetes mellitus with other specified complication: Secondary | ICD-10-CM

## 2020-03-30 DIAGNOSIS — E1122 Type 2 diabetes mellitus with diabetic chronic kidney disease: Secondary | ICD-10-CM

## 2020-03-30 DIAGNOSIS — F0390 Unspecified dementia without behavioral disturbance: Secondary | ICD-10-CM | POA: Diagnosis not present

## 2020-03-30 DIAGNOSIS — E1159 Type 2 diabetes mellitus with other circulatory complications: Secondary | ICD-10-CM

## 2020-03-30 DIAGNOSIS — E785 Hyperlipidemia, unspecified: Secondary | ICD-10-CM

## 2020-03-30 DIAGNOSIS — I10 Essential (primary) hypertension: Secondary | ICD-10-CM

## 2020-03-30 DIAGNOSIS — N1831 Chronic kidney disease, stage 3a: Secondary | ICD-10-CM

## 2020-03-30 LAB — URINALYSIS
Bilirubin, UA: NEGATIVE
Glucose, UA: NEGATIVE
Leukocytes,UA: NEGATIVE
Nitrite, UA: NEGATIVE
Specific Gravity, UA: >1.03 — ABNORMAL HIGH (ref 1.005–1.030)
Urobilinogen, Ur: 1 mg/dL (ref 0.2–1.0)
pH, UA: 5 (ref 5.0–7.5)

## 2020-03-30 NOTE — Progress Notes (Signed)
BP 112/63   Pulse 83   Temp (!) 97 F (36.1 C)   Ht _0  (1.575 m)   Wt 166 lb (75.3 kg)   SpO2 98%   BMI 30.36 kg/m    Subjective:   Patient ID: Kristy [name], female    DOB: 1927-11-25, 86 y.o.   MRN: [id number]  HPI: Kristy [name] is a 86 y.o. female presenting on 03/30/2020 for Dementia   HPI  Worsening dementia and memory Patient is coming in with worsening memory just recently over the past week, her son is here with her and she does have dementia but it has been worse.  They deny any known urinary issues.  Patient does have dementia and has had some behavioral disturbances but that is becoming less and less as her dementia is worsening, they have actually stopped her alprazolam a few weeks ago and she has not noticed a difference and has been doing fine without it. MMSE - Mini Mental State Exam 01/12/2020 04/21/2019 10/19/2018  Not completed: - - -  Orientation to time _1 Orientation to Place _2 Registration _3 Attention/ Calculation 0 5 5  Recall _4 Language- name 2 objects _5 Language- repeat 0 1 0  Language- repeat-comments HOH - -  Language- follow 3 step command _6 Language- read & follow direction 0 1 0  Language-read & follow direction-comments macula degeneration Cannot see -  Write a sentence 0 1 0  Write a sentence-comments has macula degeneration cannot see -  Copy design 0 1 0  Copy design-comments macula degeneration, did not ask cannot see. Named 5 animals -  Total score _7 Type 2 diabetes mellitus Patient comes in today for recheck of his diabetes. Patient has been currently taking glimepiride and Metformin. Patient is currently on an ACE inhibitor/ARB. Patient has not seen an ophthalmologist this year. Patient denies any issues with their feet. The symptom started onset as an adult hypertension and hyperlipidemia ARE RELATED TO DM   Hypertension Patient is currently on lisinopril and metoprolol, and their blood  pressure today is 112/63. Patient denies any lightheadedness or dizziness. Patient denies headaches, blurred vision, chest pains, shortness of breath, or weakness. Denies any side effects from medication and is content with current medication.   Hyperlipidemia Patient is coming in for recheck of his hyperlipidemia. The patient is currently taking pravastatin and Zetia. They deny any issues with myalgias or history of liver damage from it. They deny any focal numbness or weakness or chest pain.  Relevant past medical, surgical, family and social history reviewed and updated as indicated. Interim medical history since our last visit reviewed. Allergies and medications reviewed and updated.  Review of Systems  Constitutional: Negative for chills and fever.  Eyes: Negative for visual disturbance.  Respiratory: Negative for chest tightness and shortness of breath.   Cardiovascular: Negative for chest pain and leg swelling.  Skin: Negative for rash.  Neurological: Negative for light-headedness and headaches.  Psychiatric/Behavioral: Positive for confusion. Negative for agitation, behavioral problems, self-injury, sleep disturbance and suicidal ideas. The patient is not nervous/anxious.   All other systems reviewed and are negative.   Per HPI unless specifically indicated above   Allergies as of 03/30/2020      Reactions   Vioxx [rofecoxib] Other (See Comments)   Ulcers in mouth      Medication List  Accurate as of March 30, 2020  4:46 PM. If you have any questions, ask your nurse or doctor.        STOP taking these medications   memantine 5 MG tablet Commonly known as: Namenda Stopped by: Worthy Rancher, MD     TAKE these medications   ALPRAZolam 0.25 MG tablet Commonly known as: XANAX Take 1 tablet (0.25 mg total) by mouth at bedtime as needed for anxiety.   busPIRone 30 MG tablet Commonly known as: BUSPAR Take 1 tablet (30 mg total) by mouth 2 (two) times daily. (Needs  to be seen before next refill) What changed: when to take this   cholecalciferol 1000 units tablet Commonly known as: VITAMIN D Take 1,000 Units by mouth daily.   ezetimibe 10 MG tablet Commonly known as: ZETIA Take 1 tablet (10 mg total) by mouth daily.   glimepiride 4 MG tablet Commonly known as: AMARYL Take 1 tablet (4 mg total) by mouth daily with breakfast.   glucose blood test strip Commonly known as: FREESTYLE TEST STRIPS Check Blood sugar two times daily   glucose blood test strip Commonly known as: ONE TOUCH ULTRA TEST CHECK BLOOD SUGAR TWO TIMES DAILY   hydroxypropyl methylcellulose / hypromellose 2.5 % ophthalmic solution Commonly known as: ISOPTO TEARS / GONIOVISC Place 1 drop into both eyes 2 (two) times daily as needed (dry eyes).   lisinopril 10 MG tablet Commonly known as: ZESTRIL TAKE 1/2 TABLET BY MOUTH DAILY AS DIRECTED   Lumigan 0.01 % Soln Generic drug: bimatoprost   metFORMIN 500 MG 24 hr tablet Commonly known as: GLUCOPHAGE-XR Take 2 tablets (1,000 mg total) by mouth 2 (two) times daily.   metoprolol succinate 25 MG 24 hr tablet Commonly known as: TOPROL-XL Take 1 tablet (25 mg total) by mouth daily.   onetouch ultrasoft lancets Check blood sugars twice a day   pravastatin 80 MG tablet Commonly known as: PRAVACHOL TAKE 1 TABLET BY MOUTH EVERYDAY AT BEDTIME   PRESERVISION AREDS 2 PO Take by mouth.        Objective:   BP 112/63   Pulse 83   Temp (!) 97 F (36.1 C)   Ht _0  (1.575 m)   Wt 166 lb (75.3 kg)   SpO2 98%   BMI 30.36 kg/m   Wt Readings from Last 3 Encounters:  03/30/20 166 lb (75.3 kg)  01/12/20 171 lb 6.4 oz (77.7 kg)  08/20/19 166 lb 6.4 oz (75.5 kg)    Physical Exam Vitals and nursing note reviewed.  Constitutional:      General: She is not in acute distress.    Appearance: She is well-developed. She is not diaphoretic.  Eyes:     Conjunctiva/sclera: Conjunctivae normal.  Cardiovascular:     Rate and  Rhythm: Normal rate and regular rhythm.     Heart sounds: Normal heart sounds. No murmur heard.   Pulmonary:     Effort: Pulmonary effort is normal. No respiratory distress.     Breath sounds: Normal breath sounds. No wheezing.  Musculoskeletal:        General: No tenderness. Normal range of motion.  Skin:    General: Skin is warm and dry.     Findings: No rash.  Neurological:     Mental Status: She is alert and oriented to person, place, and time.     Coordination: Coordination normal.  Psychiatric:        Behavior: Behavior normal.  Cognition and Memory: Cognition is impaired. Memory is impaired. She exhibits impaired recent memory.     Urinalysis: 2+ bilirubin 1+ ketone, trace blood, 1+ protein, negative for leukocytes and negative for nitrite. Will await for culture  Assessment & Plan:   Problem List Items Addressed This Visit      Cardiovascular and Mediastinum   Hypertension associated with diabetes ([place])     Endocrine   Hyperlipidemia associated with type 2 diabetes mellitus ([place])   Relevant Orders   Lipid panel (Completed)   Diabetes mellitus type 2 controlled   Relevant Orders   CMP14+EGFR (Completed)   Type 2 diabetes mellitus with stage 3 chronic kidney disease ([place])   Relevant Orders   CMP14+EGFR (Completed)     Nervous and Auditory   Dementia without behavioral disturbance ([place]) - Primary   Relevant Orders   CBC with Differential/Platelet (Completed)    Other Visit Diagnoses    Memory changes       Relevant Orders   Urinalysis, Complete   Urine Culture (Completed)   Urine Culture   Urinalysis (Completed)   Hyperthyroidism       Relevant Orders   TSH (Completed)      Not likely infectious based on the urinalysis but will wait for culture to make sure. Likely just worsening dementia and memory and sundowning.  Much more severe and her husband is having difficulty taking care of her. She has had decreased appetite as well because of the  worsening dementia  We discussed [place] 2 versus hospice placement and then will look into that and bring the paperwork back if needed. Follow up plan: Return in about 3 months (around 06/30/2020), or if symptoms worsen or fail to improve, for Dementia recheck.  Counseling provided for all of the vaccine components Orders Placed This Encounter  Procedures  . Urine Culture  . Urine Culture  . CBC with Differential/Platelet  . CMP14+EGFR  . Lipid panel  . Urinalysis, Complete  . TSH  . Urinalysis    Caryl Pina, MD [facility] 03/30/2020, 4:46 PM

## 2020-03-31 LAB — CBC WITH DIFFERENTIAL/PLATELET
Basophils Absolute: 0.1 10*3/uL (ref 0.0–0.2)
Basos: 0 %
EOS (ABSOLUTE): 0.1 10*3/uL (ref 0.0–0.4)
Eos: 1 %
Hematocrit: 34.3 % (ref 34.0–46.6)
Hemoglobin: 11.7 g/dL (ref 11.1–15.9)
Immature Grans (Abs): 0 10*3/uL (ref 0.0–0.1)
Immature Granulocytes: 0 %
Lymphocytes Absolute: 7.7 10*3/uL — ABNORMAL HIGH (ref 0.7–3.1)
Lymphs: 46 %
MCH: 30.9 pg (ref 26.6–33.0)
MCHC: 34.1 g/dL (ref 31.5–35.7)
MCV: 91 fL (ref 79–97)
Monocytes Absolute: 1.1 10*3/uL — ABNORMAL HIGH (ref 0.1–0.9)
Monocytes: 7 %
Neutrophils Absolute: 7.9 10*3/uL — ABNORMAL HIGH (ref 1.4–7.0)
Neutrophils: 46 %
Platelets: 229 10*3/uL (ref 150–450)
RBC: 3.79 x10E6/uL (ref 3.77–5.28)
RDW: 13 % (ref 11.7–15.4)
WBC: 16.9 10*3/uL — ABNORMAL HIGH (ref 3.4–10.8)

## 2020-03-31 LAB — CMP14+EGFR
ALT: 16 IU/L (ref 0–32)
AST: 15 IU/L (ref 0–40)
Albumin/Globulin Ratio: 2.3 — ABNORMAL HIGH (ref 1.2–2.2)
Albumin: 4.2 g/dL (ref 3.6–4.6)
Alkaline Phosphatase: 53 IU/L (ref 48–121)
BUN/Creatinine Ratio: 14 (ref 12–28)
BUN: 16 mg/dL (ref 8–27)
Bilirubin Total: 0.4 mg/dL (ref 0.0–1.2)
CO2: 17 mmol/L — ABNORMAL LOW (ref 20–29)
Calcium: 9.8 mg/dL (ref 8.7–10.3)
Chloride: 103 mmol/L (ref 96–106)
Creatinine, Ser: 1.17 mg/dL — ABNORMAL HIGH (ref 0.57–1.00)
GFR calc Af Amer: 48 mL/min/{1.73_m2} — ABNORMAL LOW (ref >59)
GFR calc non Af Amer: 41 mL/min/{1.73_m2} — ABNORMAL LOW (ref >59)
Globulin, Total: 1.8 g/dL (ref 1.5–4.5)
Glucose: 206 mg/dL — ABNORMAL HIGH (ref 65–99)
Potassium: 5.5 mmol/L — ABNORMAL HIGH (ref 3.5–5.2)
Sodium: 135 mmol/L (ref 134–144)
Total Protein: 6 g/dL (ref 6.0–8.5)

## 2020-03-31 LAB — LIPID PANEL
Chol/HDL Ratio: 2.6 ratio (ref 0.0–4.4)
Cholesterol, Total: 128 mg/dL (ref 100–199)
HDL: 49 mg/dL (ref >39)
LDL Chol Calc (NIH): 49 mg/dL (ref 0–99)
Triglycerides: 181 mg/dL — ABNORMAL HIGH (ref 0–149)
VLDL Cholesterol Cal: 30 mg/dL (ref 5–40)

## 2020-03-31 LAB — TSH: TSH: 0.888 u[IU]/mL (ref 0.450–4.500)

## 2020-04-03 LAB — URINE CULTURE

## 2020-04-07 ENCOUNTER — Ambulatory Visit: Payer: Medicare Other | Admitting: Family Medicine

## 2020-05-15 ENCOUNTER — Other Ambulatory Visit: Payer: Self-pay | Admitting: Family Medicine

## 2020-05-15 DIAGNOSIS — H353114 Nonexudative age-related macular degeneration, right eye, advanced atrophic with subfoveal involvement: Secondary | ICD-10-CM | POA: Diagnosis not present

## 2020-05-15 DIAGNOSIS — H401133 Primary open-angle glaucoma, bilateral, severe stage: Secondary | ICD-10-CM | POA: Diagnosis not present

## 2020-05-15 DIAGNOSIS — F341 Dysthymic disorder: Secondary | ICD-10-CM

## 2020-06-16 DIAGNOSIS — Z961 Presence of intraocular lens: Secondary | ICD-10-CM | POA: Diagnosis not present

## 2020-06-16 DIAGNOSIS — H3581 Retinal edema: Secondary | ICD-10-CM | POA: Diagnosis not present

## 2020-06-16 DIAGNOSIS — H353221 Exudative age-related macular degeneration, left eye, with active choroidal neovascularization: Secondary | ICD-10-CM | POA: Diagnosis not present

## 2020-06-16 DIAGNOSIS — H353134 Nonexudative age-related macular degeneration, bilateral, advanced atrophic with subfoveal involvement: Secondary | ICD-10-CM | POA: Diagnosis not present

## 2020-07-06 ENCOUNTER — Ambulatory Visit (INDEPENDENT_AMBULATORY_CARE_PROVIDER_SITE_OTHER): Payer: Medicare Other | Admitting: Family Medicine

## 2020-07-06 ENCOUNTER — Other Ambulatory Visit: Payer: Self-pay

## 2020-07-06 ENCOUNTER — Encounter: Payer: Self-pay | Admitting: Family Medicine

## 2020-07-06 VITALS — BP 138/64 | HR 76 | Temp 98.2°F | Ht 62.0 in | Wt 167.0 lb

## 2020-07-06 DIAGNOSIS — E1169 Type 2 diabetes mellitus with other specified complication: Secondary | ICD-10-CM | POA: Diagnosis not present

## 2020-07-06 DIAGNOSIS — E1122 Type 2 diabetes mellitus with diabetic chronic kidney disease: Secondary | ICD-10-CM

## 2020-07-06 DIAGNOSIS — Z23 Encounter for immunization: Secondary | ICD-10-CM

## 2020-07-06 DIAGNOSIS — N1831 Chronic kidney disease, stage 3a: Secondary | ICD-10-CM

## 2020-07-06 DIAGNOSIS — I152 Hypertension secondary to endocrine disorders: Secondary | ICD-10-CM

## 2020-07-06 DIAGNOSIS — E1159 Type 2 diabetes mellitus with other circulatory complications: Secondary | ICD-10-CM

## 2020-07-06 DIAGNOSIS — E785 Hyperlipidemia, unspecified: Secondary | ICD-10-CM

## 2020-07-06 DIAGNOSIS — F0390 Unspecified dementia without behavioral disturbance: Secondary | ICD-10-CM | POA: Diagnosis not present

## 2020-07-06 LAB — BAYER DCA HB A1C WAIVED: HB A1C (BAYER DCA - WAIVED): 5.8 % (ref <7.0)

## 2020-07-06 MED ORDER — GLIMEPIRIDE 2 MG PO TABS: 2.0000 mg | ORAL_TABLET | Freq: Every day | ORAL | 3 refills | Status: DC

## 2020-07-06 NOTE — Progress Notes (Signed)
BP (!) 159/72   Pulse 76   Temp 98.2 F (36.8 C)   Ht '5\' 2"'  (1.575 m)   Wt 167 lb (75.8 kg)   SpO2 97%   BMI 30.54 kg/m    Subjective:   Patient ID: Kristy [name], female    DOB: 12/08/1927, 86 y.o.   MRN: [id number]  HPI: Kristy [name] is a 86 y.o. female presenting on 07/06/2020 for Medical Management of Chronic Issues, Diabetes, and Dementia   HPI Type 2 diabetes mellitus Patient comes in today for recheck of his diabetes. Patient has been currently taking glipizide and Metformin, A1c 5.8 today.  Will reduce the medicine.. Patient is currently on an ACE inhibitor/ARB. Patient has not seen an ophthalmologist this year. Patient denies any issues with their feet. The symptom started onset as an adult hypertension hyperlipidemia ARE RELATED TO DM   Hypertension Patient is currently on lisinopril and metoprolol, and their blood pressure today is 159/72. Patient denies any lightheadedness or dizziness. Patient denies headaches, blurred vision, chest pains, shortness of breath, or weakness. Denies any side effects from medication and is content with current medication.   Hyperlipidemia Patient is coming in for recheck of his hyperlipidemia. The patient is currently taking Zetia and pravastatin. They deny any issues with myalgias or history of liver damage from it. They deny any focal numbness or weakness or chest pain.   Dementia and transient altered awareness. Patient has been seeing neurology for this and was started on Namenda.  MMSE is 25 today.  Patient's son says that should her dementia and memory is worsening significantly, they do have a sitter that stays with her but it has been very challenging. MMSE - Mini Mental State Exam 01/12/2020 04/21/2019 10/19/2018  Not completed: - - -  Orientation to time '3 4 2  ' Orientation to Place '1 3 4  ' Registration '3 3 3  ' Attention/ Calculation 0 5 5  Recall '1 2 3  ' Language- name 2 objects '2 2 2  ' Language- repeat 0 1 0  Language-  repeat-comments HOH - -  Language- follow 3 step command '3 3 3  ' Language- read & follow direction 0 1 0  Language-read & follow direction-comments macula degeneration Cannot see -  Write a sentence 0 1 0  Write a sentence-comments has macula degeneration cannot see -  Copy design 0 1 0  Copy design-comments macula degeneration, did not ask cannot see. Named 5 animals -  Total score '13 26 22     ' Relevant past medical, surgical, family and social history reviewed and updated as indicated. Interim medical history since our last visit reviewed. Allergies and medications reviewed and updated.  Review of Systems  Constitutional: Negative for chills and fever.  Eyes: Negative for visual disturbance.  Respiratory: Negative for chest tightness and shortness of breath.   Cardiovascular: Negative for chest pain and leg swelling.  Musculoskeletal: Negative for back pain and gait problem.  Skin: Negative for rash.  Neurological: Negative for light-headedness and headaches.  Psychiatric/Behavioral: Positive for confusion. Negative for agitation and behavioral problems.  All other systems reviewed and are negative.   Per HPI unless specifically indicated above   Allergies as of 07/06/2020      Reactions   Vioxx [rofecoxib] Other (See Comments)   Ulcers in mouth      Medication List       Accurate as of July 06, 2020  8:26 AM. If you have any questions, ask your nurse or  doctor.        ALPRAZolam 0.25 MG tablet Commonly known as: XANAX Take 1 tablet (0.25 mg total) by mouth at bedtime as needed for anxiety.   busPIRone 30 MG tablet Commonly known as: BUSPAR Take 1 tablet (30 mg total) by mouth 2 (two) times daily.   cholecalciferol 1000 units tablet Commonly known as: VITAMIN D Take 1,000 Units by mouth daily.   ezetimibe 10 MG tablet Commonly known as: ZETIA Take 1 tablet (10 mg total) by mouth daily.   glimepiride 4 MG tablet Commonly known as: AMARYL Take 1 tablet (4  mg total) by mouth daily with breakfast.   glucose blood test strip Commonly known as: FREESTYLE TEST STRIPS Check Blood sugar two times daily   glucose blood test strip Commonly known as: ONE TOUCH ULTRA TEST CHECK BLOOD SUGAR TWO TIMES DAILY   hydroxypropyl methylcellulose / hypromellose 2.5 % ophthalmic solution Commonly known as: ISOPTO TEARS / GONIOVISC Place 1 drop into both eyes 2 (two) times daily as needed (dry eyes).   lisinopril 10 MG tablet Commonly known as: ZESTRIL TAKE 1/2 TABLET BY MOUTH DAILY AS DIRECTED   Lumigan 0.01 % Soln Generic drug: bimatoprost   metFORMIN 500 MG 24 hr tablet Commonly known as: GLUCOPHAGE-XR Take 2 tablets (1,000 mg total) by mouth 2 (two) times daily.   metoprolol succinate 25 MG 24 hr tablet Commonly known as: TOPROL-XL Take 1 tablet (25 mg total) by mouth daily.   onetouch ultrasoft lancets Check blood sugars twice a day   pravastatin 80 MG tablet Commonly known as: PRAVACHOL TAKE 1 TABLET BY MOUTH EVERYDAY AT BEDTIME   PRESERVISION AREDS 2 PO Take by mouth.        Objective:   BP (!) 159/72   Pulse 76   Temp 98.2 F (36.8 C)   Ht '5\' 2"'  (1.575 m)   Wt 167 lb (75.8 kg)   SpO2 97%   BMI 30.54 kg/m   Wt Readings from Last 3 Encounters:  07/06/20 167 lb (75.8 kg)  03/30/20 166 lb (75.3 kg)  01/12/20 171 lb 6.4 oz (77.7 kg)    Physical Exam Vitals and nursing note reviewed.  Constitutional:      General: She is not in acute distress.    Appearance: She is well-developed. She is not diaphoretic.  Eyes:     Conjunctiva/sclera: Conjunctivae normal.  Cardiovascular:     Rate and Rhythm: Normal rate and regular rhythm.     Heart sounds: Normal heart sounds. No murmur heard.   Pulmonary:     Effort: Pulmonary effort is normal. No respiratory distress.     Breath sounds: Normal breath sounds. No wheezing.  Musculoskeletal:        General: No tenderness. Normal range of motion.  Skin:    General: Skin is warm  and dry.     Findings: No rash.  Neurological:     Mental Status: She is alert and oriented to person, place, and time.     Coordination: Coordination normal.  Psychiatric:        Behavior: Behavior normal.        Cognition and Memory: Memory is impaired. She exhibits impaired recent memory.       Assessment & Plan:   Problem List Items Addressed This Visit      Cardiovascular and Mediastinum   Hypertension associated with diabetes ([place])   Relevant Medications   glimepiride (AMARYL) 2 MG tablet     Endocrine  Hyperlipidemia associated with type 2 diabetes mellitus ([facility])   Relevant Medications   glimepiride (AMARYL) 2 MG tablet   Diabetes mellitus type 2 controlled - Primary   Relevant Medications   glimepiride (AMARYL) 2 MG tablet   Other Relevant Orders   Bayer DCA Hb A1c Waived (Completed)   CBC with Differential/Platelet   CMP14+EGFR   TSH   Type 2 diabetes mellitus with stage 3 chronic kidney disease ([facility])   Relevant Medications   glimepiride (AMARYL) 2 MG tablet   Other Relevant Orders   TSH     Nervous and Auditory   Dementia without behavioral disturbance ([place])   Relevant Orders   TSH    A1c looks good, will reduce glimepiride to 2 mg.  Follow up plan: Return if symptoms worsen or fail to improve.  Counseling provided for all of the vaccine components Orders Placed This Encounter  Procedures  . [employer] Hb A1c [place], MD [facility] 07/06/2020, 8:26 AM

## 2020-07-07 LAB — CBC WITH DIFFERENTIAL/PLATELET
Basophils Absolute: 0.1 10*3/uL (ref 0.0–0.2)
Basos: 0 %
EOS (ABSOLUTE): 0.1 10*3/uL (ref 0.0–0.4)
Eos: 1 %
Hematocrit: 35 % (ref 34.0–46.6)
Hemoglobin: 11.9 g/dL (ref 11.1–15.9)
Immature Grans (Abs): 0 10*3/uL (ref 0.0–0.1)
Immature Granulocytes: 0 %
Lymphocytes Absolute: 8.7 10*3/uL — ABNORMAL HIGH (ref 0.7–3.1)
Lymphs: 57 %
MCH: 31.6 pg (ref 26.6–33.0)
MCHC: 34 g/dL (ref 31.5–35.7)
MCV: 93 fL (ref 79–97)
Monocytes Absolute: 1 10*3/uL — ABNORMAL HIGH (ref 0.1–0.9)
Monocytes: 6 %
Neutrophils Absolute: 5.5 10*3/uL (ref 1.4–7.0)
Neutrophils: 36 %
Platelets: 178 10*3/uL (ref 150–450)
RBC: 3.76 x10E6/uL — ABNORMAL LOW (ref 3.77–5.28)
RDW: 12.8 % (ref 11.7–15.4)
WBC: 15.4 10*3/uL — ABNORMAL HIGH (ref 3.4–10.8)

## 2020-07-07 LAB — TSH: TSH: 1.76 u[IU]/mL (ref 0.450–4.500)

## 2020-07-07 LAB — CMP14+EGFR
ALT: 10 IU/L (ref 0–32)
AST: 13 IU/L (ref 0–40)
Albumin/Globulin Ratio: 1.9 (ref 1.2–2.2)
Albumin: 4 g/dL (ref 3.6–4.6)
Alkaline Phosphatase: 44 IU/L (ref 44–121)
BUN/Creatinine Ratio: 10 — ABNORMAL LOW (ref 12–28)
BUN: 11 mg/dL (ref 8–27)
Bilirubin Total: 0.4 mg/dL (ref 0.0–1.2)
CO2: 19 mmol/L — ABNORMAL LOW (ref 20–29)
Calcium: 9.2 mg/dL (ref 8.7–10.3)
Chloride: 107 mmol/L — ABNORMAL HIGH (ref 96–106)
Creatinine, Ser: 1.06 mg/dL — ABNORMAL HIGH (ref 0.57–1.00)
GFR calc Af Amer: 54 mL/min/{1.73_m2} — ABNORMAL LOW (ref >59)
GFR calc non Af Amer: 47 mL/min/{1.73_m2} — ABNORMAL LOW (ref >59)
Globulin, Total: 2.1 g/dL (ref 1.5–4.5)
Glucose: 127 mg/dL — ABNORMAL HIGH (ref 65–99)
Potassium: 4.8 mmol/L (ref 3.5–5.2)
Sodium: 141 mmol/L (ref 134–144)
Total Protein: 6.1 g/dL (ref 6.0–8.5)

## 2020-07-13 ENCOUNTER — Ambulatory Visit: Payer: Medicare Other | Admitting: Neurology

## 2020-08-28 ENCOUNTER — Other Ambulatory Visit: Payer: Self-pay | Admitting: Family Medicine

## 2020-08-30 ENCOUNTER — Ambulatory Visit: Payer: Medicare Other | Admitting: Neurology

## 2020-09-01 ENCOUNTER — Telehealth: Payer: Self-pay

## 2020-09-01 NOTE — Telephone Encounter (Signed)
Pts son called stating that Dr Dettinger sent in the wrong Rx for pt regarding her Metformin. Says pt is supposed to be taking 2 tablets, twice a day, which is a total of 4 tablets each day. Please correct and resend Rx to [facility] in [place].

## 2020-09-01 NOTE — Telephone Encounter (Signed)
We reduced patient's Metformin to just 2 tablets a day because her A1c was 5.8 and she did not need as much anymore.

## 2020-09-01 NOTE — Telephone Encounter (Signed)
Son aware.

## 2020-09-01 NOTE — Telephone Encounter (Signed)
Please review and advise.

## 2020-09-16 ENCOUNTER — Other Ambulatory Visit: Payer: Self-pay | Admitting: Family Medicine

## 2020-09-20 ENCOUNTER — Other Ambulatory Visit: Payer: Self-pay | Admitting: Family Medicine

## 2020-09-27 ENCOUNTER — Ambulatory Visit: Payer: Medicare Other

## 2020-09-29 ENCOUNTER — Other Ambulatory Visit: Payer: Self-pay | Admitting: Family Medicine

## 2020-10-01 ENCOUNTER — Other Ambulatory Visit: Payer: Self-pay | Admitting: Family Medicine

## 2020-10-04 ENCOUNTER — Other Ambulatory Visit: Payer: Self-pay

## 2020-10-04 ENCOUNTER — Ambulatory Visit (INDEPENDENT_AMBULATORY_CARE_PROVIDER_SITE_OTHER): Payer: Medicare Other

## 2020-10-04 DIAGNOSIS — Z23 Encounter for immunization: Secondary | ICD-10-CM | POA: Diagnosis not present

## 2020-10-04 NOTE — Progress Notes (Signed)
   Covid-[facility] Vaccination [facility]  Name:  Kristy [name]    MRN: [id number] DOB: July 02, 1928  10/04/2020  Kristy [name] was observed post Covid-19 immunization for 15 minutes without incident. She was provided with Vaccine Information Sheet and instruction to access the V-Safe system.   Kristy [name] was instructed to call 911 with any severe reactions post vaccine: Marland Kitchen Difficulty breathing  . Swelling of face and throat  . A fast heartbeat  . A bad rash all over body  . Dizziness and weakness   Immunizations Administered    Name Date Dose VIS Date Route   Moderna Covid-19 Booster Vaccine 10/04/2020  8:35 AM 0.25 mL 07/19/2020 Intramuscular   Manufacturer: Gala Murdoch   Lot: [id number]   NDC: 53794-327-61

## 2020-10-05 ENCOUNTER — Encounter: Payer: Self-pay | Admitting: Family Medicine

## 2020-10-05 ENCOUNTER — Ambulatory Visit (INDEPENDENT_AMBULATORY_CARE_PROVIDER_SITE_OTHER): Payer: Medicare Other | Admitting: Family Medicine

## 2020-10-05 VITALS — BP 144/64 | HR 77 | Ht 62.0 in | Wt 166.0 lb

## 2020-10-05 DIAGNOSIS — E1122 Type 2 diabetes mellitus with diabetic chronic kidney disease: Secondary | ICD-10-CM

## 2020-10-05 DIAGNOSIS — I152 Hypertension secondary to endocrine disorders: Secondary | ICD-10-CM | POA: Diagnosis not present

## 2020-10-05 DIAGNOSIS — N1831 Chronic kidney disease, stage 3a: Secondary | ICD-10-CM

## 2020-10-05 DIAGNOSIS — E785 Hyperlipidemia, unspecified: Secondary | ICD-10-CM

## 2020-10-05 DIAGNOSIS — E1159 Type 2 diabetes mellitus with other circulatory complications: Secondary | ICD-10-CM | POA: Diagnosis not present

## 2020-10-05 DIAGNOSIS — E1169 Type 2 diabetes mellitus with other specified complication: Secondary | ICD-10-CM

## 2020-10-05 LAB — BAYER DCA HB A1C WAIVED: HB A1C (BAYER DCA - WAIVED): 6.9 % (ref <7.0)

## 2020-10-05 NOTE — Progress Notes (Signed)
BP (!) 144/64   Pulse 77   Ht '5\' 2"'  (1.575 m)   Wt 166 lb (75.3 kg)   SpO2 97%   BMI 30.36 kg/m    Subjective:   Patient ID: Kristy [name], female    DOB: [birth year], 87 y.o.   MRN: [id number]  HPI: Kristy [name] is a 87 y.o. female presenting on 10/05/2020 for Medical Management of Chronic Issues, Diabetes, and Dementia (/)   HPI Type 2 diabetes mellitus Patient comes in today for recheck of his diabetes. Patient has been currently taking Metformin and glipizide. Patient is currently on an ACE inhibitor/ARB. Patient has seen an ophthalmologist this year. Patient denies any issues with their feet. The symptom started onset as an adult hypertension and hyperlipidemia ARE RELATED TO DM   Hypertension Patient is currently on lisinopril and metoprolol, and their blood pressure today is 144/64. Patient denies any lightheadedness or dizziness. Patient denies headaches, blurred vision, chest pains, shortness of breath, or weakness. Denies any side effects from medication and is content with current medication.   Hyperlipidemia Patient is coming in for recheck of his hyperlipidemia. The patient is currently taking pravastatin and Zetia. They deny any issues with myalgias or history of liver damage from it. They deny any focal numbness or weakness or chest pain.   Patient's dementia is worsening and her appetite is fairly good throat we discussed possibly contacting palliative care to get more services and home care to help her husband.  Relevant past medical, surgical, family and social history reviewed and updated as indicated. Interim medical history since our last visit reviewed. Allergies and medications reviewed and updated.  Review of Systems  Constitutional: Negative for chills and fever.  Eyes: Negative for visual disturbance.  Respiratory: Negative for chest tightness and shortness of breath.   Cardiovascular: Negative for chest pain and leg swelling.  Musculoskeletal:  Negative for back pain and gait problem.  Skin: Negative for rash.  Neurological: Negative for light-headedness and headaches.  Psychiatric/Behavioral: Positive for confusion. Negative for agitation and behavioral problems.  All other systems reviewed and are negative.   Per HPI unless specifically indicated above   Allergies as of 10/05/2020      Reactions   Vioxx [rofecoxib] Other (See Comments)   Ulcers in mouth      Medication List       Accurate as of October 05, 2020  9:29 AM. If you have any questions, ask your nurse or doctor.        busPIRone 30 MG tablet Commonly known as: BUSPAR Take 1 tablet (30 mg total) by mouth 2 (two) times daily.   cholecalciferol 1000 units tablet Commonly known as: VITAMIN D Take 1,000 Units by mouth daily.   ezetimibe 10 MG tablet Commonly known as: ZETIA TAKE 1 TABLET BY MOUTH EVERY DAY   glimepiride 2 MG tablet Commonly known as: AMARYL Take 1 tablet (2 mg total) by mouth daily with breakfast.   glucose blood test strip Commonly known as: FREESTYLE TEST STRIPS Check Blood sugar two times daily   glucose blood test strip Commonly known as: ONE TOUCH ULTRA TEST CHECK BLOOD SUGAR TWO TIMES DAILY   hydroxypropyl methylcellulose / hypromellose 2.5 % ophthalmic solution Commonly known as: ISOPTO TEARS / GONIOVISC Place 1 drop into both eyes 2 (two) times daily as needed (dry eyes).   lisinopril 10 MG tablet Commonly known as: ZESTRIL TAKE 1/2 TABLET BY MOUTH DAILY AS DIRECTED   Lumigan 0.01 % Soln  Generic drug: bimatoprost   metFORMIN 500 MG 24 hr tablet Commonly known as: GLUCOPHAGE-XR TAKE 2 TABLETS BY MOUTH TWICE A DAY   metoprolol succinate 25 MG 24 hr tablet Commonly known as: TOPROL-XL Take 1 tablet (25 mg total) by mouth daily.   onetouch ultrasoft lancets Check blood sugars twice a day   pravastatin 80 MG tablet Commonly known as: PRAVACHOL TAKE 1 TABLET BY MOUTH EVERYDAY AT BEDTIME   PRESERVISION AREDS 2  PO Take by mouth.        Objective:   BP (!) 144/64   Pulse 77   Ht '5\' 2"'  (1.575 m)   Wt 166 lb (75.3 kg)   SpO2 97%   BMI 30.36 kg/m   Wt Readings from Last 3 Encounters:  10/05/20 166 lb (75.3 kg)  07/06/20 167 lb (75.8 kg)  03/30/20 166 lb (75.3 kg)    Physical Exam Vitals and nursing note reviewed.  Constitutional:      General: She is not in acute distress.    Appearance: She is well-developed and well-nourished. She is not diaphoretic.  Eyes:     Extraocular Movements: EOM normal.     Conjunctiva/sclera: Conjunctivae normal.  Cardiovascular:     Rate and Rhythm: Normal rate and regular rhythm.     Pulses: Intact distal pulses.     Heart sounds: Normal heart sounds. No murmur heard.   Pulmonary:     Effort: Pulmonary effort is normal. No respiratory distress.     Breath sounds: Normal breath sounds. No wheezing.  Musculoskeletal:        General: No tenderness or edema. Normal range of motion.  Skin:    General: Skin is warm and dry.     Findings: No rash.  Neurological:     Mental Status: She is alert and oriented to person, place, and time.     Coordination: Coordination normal.  Psychiatric:        Mood and Affect: Mood and affect normal.        Behavior: Behavior normal.       Assessment & Plan:   Problem List Items Addressed This Visit      Cardiovascular and Mediastinum   Hypertension associated with diabetes ([place])     Endocrine   Hyperlipidemia associated with type 2 diabetes mellitus ([place])   Relevant Orders   BMP8+EGFR   Bayer DCA Hb A1c Waived   Diabetes mellitus type 2 controlled - Primary   Relevant Orders   BMP8+EGFR   Bayer DCA Hb A1c Waived   Type 2 diabetes mellitus with stage 3 chronic kidney disease (HCC)      Patient's dementia is worsening, gave information for hospice and palliative care, continue current medication, she is on Metformin 500 twice a day and glipizide 4 mg daily. Follow up plan: Return in about 6 months  (around 04/04/2021), or if symptoms worsen or fail to improve, for Hypertension and cholesterol diabetes.  Counseling provided for all of the vaccine components Orders Placed This Encounter  Procedures  . BMP8+EGFR  . [facility] Hb A1c Bock, MD [facility] 10/05/2020, 9:29 AM

## 2020-10-06 LAB — BMP8+EGFR
BUN/Creatinine Ratio: 10 — ABNORMAL LOW (ref 12–28)
BUN: 12 mg/dL (ref 10–36)
CO2: 22 mmol/L (ref 20–29)
Calcium: 9.7 mg/dL (ref 8.7–10.3)
Chloride: 101 mmol/L (ref 96–106)
Creatinine, Ser: 1.17 mg/dL — ABNORMAL HIGH (ref 0.57–1.00)
GFR calc Af Amer: 47 mL/min/{1.73_m2} — ABNORMAL LOW (ref >59)
GFR calc non Af Amer: 41 mL/min/{1.73_m2} — ABNORMAL LOW (ref >59)
Glucose: 195 mg/dL — ABNORMAL HIGH (ref 65–99)
Potassium: 4.9 mmol/L (ref 3.5–5.2)
Sodium: 139 mmol/L (ref 134–144)

## 2020-10-09 ENCOUNTER — Telehealth: Payer: Self-pay

## 2020-10-09 NOTE — Telephone Encounter (Signed)
Please advise about the change?

## 2020-10-10 NOTE — Telephone Encounter (Signed)
Please verify dosage of Glimepiride, and send new Rx if appropriate to [facility]

## 2020-10-11 ENCOUNTER — Other Ambulatory Visit: Payer: Self-pay | Admitting: Family Medicine

## 2020-10-11 MED ORDER — GLIMEPIRIDE 4 MG PO TABS: 4.0000 mg | ORAL_TABLET | Freq: Every day | ORAL | 3 refills | Status: DC

## 2020-10-11 NOTE — Telephone Encounter (Signed)
Sent glimepiride for the patient, I cannot tell exactly where it was changed from 4 to 2 but watch for hypoglycemia.

## 2020-10-11 NOTE — Telephone Encounter (Signed)
Son aware and verbalizes understanding.  

## 2020-10-18 ENCOUNTER — Other Ambulatory Visit: Payer: Self-pay | Admitting: Family Medicine

## 2020-12-13 ENCOUNTER — Other Ambulatory Visit: Payer: Self-pay | Admitting: Family Medicine

## 2020-12-13 DIAGNOSIS — F341 Dysthymic disorder: Secondary | ICD-10-CM

## 2021-01-09 ENCOUNTER — Other Ambulatory Visit: Payer: Self-pay | Admitting: Family Medicine

## 2021-01-15 DIAGNOSIS — H401133 Primary open-angle glaucoma, bilateral, severe stage: Secondary | ICD-10-CM | POA: Diagnosis not present

## 2021-01-20 ENCOUNTER — Other Ambulatory Visit: Payer: Self-pay | Admitting: Family Medicine

## 2021-01-30 ENCOUNTER — Other Ambulatory Visit: Payer: Self-pay | Admitting: Family Medicine

## 2021-03-20 DIAGNOSIS — H353134 Nonexudative age-related macular degeneration, bilateral, advanced atrophic with subfoveal involvement: Secondary | ICD-10-CM | POA: Diagnosis not present

## 2021-03-20 DIAGNOSIS — H3581 Retinal edema: Secondary | ICD-10-CM | POA: Diagnosis not present

## 2021-03-20 DIAGNOSIS — Z961 Presence of intraocular lens: Secondary | ICD-10-CM | POA: Diagnosis not present

## 2021-03-20 DIAGNOSIS — H353221 Exudative age-related macular degeneration, left eye, with active choroidal neovascularization: Secondary | ICD-10-CM | POA: Diagnosis not present

## 2021-03-31 ENCOUNTER — Other Ambulatory Visit: Payer: Self-pay | Admitting: Family Medicine

## 2021-03-31 DIAGNOSIS — F341 Dysthymic disorder: Secondary | ICD-10-CM

## 2021-04-03 ENCOUNTER — Other Ambulatory Visit: Payer: Self-pay | Admitting: Family Medicine

## 2021-04-04 ENCOUNTER — Other Ambulatory Visit: Payer: Self-pay

## 2021-04-04 ENCOUNTER — Encounter: Payer: Self-pay | Admitting: Family Medicine

## 2021-04-04 ENCOUNTER — Ambulatory Visit (INDEPENDENT_AMBULATORY_CARE_PROVIDER_SITE_OTHER): Payer: Medicare Other | Admitting: Family Medicine

## 2021-04-04 VITALS — BP 120/70 | HR 70 | Ht 62.0 in | Wt 167.0 lb

## 2021-04-04 DIAGNOSIS — E0590 Thyrotoxicosis, unspecified without thyrotoxic crisis or storm: Secondary | ICD-10-CM | POA: Diagnosis not present

## 2021-04-04 DIAGNOSIS — F341 Dysthymic disorder: Secondary | ICD-10-CM

## 2021-04-04 DIAGNOSIS — I152 Hypertension secondary to endocrine disorders: Secondary | ICD-10-CM

## 2021-04-04 DIAGNOSIS — N1831 Chronic kidney disease, stage 3a: Secondary | ICD-10-CM

## 2021-04-04 DIAGNOSIS — E1169 Type 2 diabetes mellitus with other specified complication: Secondary | ICD-10-CM

## 2021-04-04 DIAGNOSIS — Z23 Encounter for immunization: Secondary | ICD-10-CM | POA: Diagnosis not present

## 2021-04-04 DIAGNOSIS — E1122 Type 2 diabetes mellitus with diabetic chronic kidney disease: Secondary | ICD-10-CM | POA: Diagnosis not present

## 2021-04-04 DIAGNOSIS — E785 Hyperlipidemia, unspecified: Secondary | ICD-10-CM

## 2021-04-04 DIAGNOSIS — E1159 Type 2 diabetes mellitus with other circulatory complications: Secondary | ICD-10-CM | POA: Diagnosis not present

## 2021-04-04 LAB — BAYER DCA HB A1C WAIVED: HB A1C (BAYER DCA - WAIVED): 7.1 % — ABNORMAL HIGH (ref <7.0)

## 2021-04-04 MED ORDER — LISINOPRIL 10 MG PO TABS: 5.0000 mg | ORAL_TABLET | Freq: Every day | ORAL | 3 refills | Status: DC

## 2021-04-04 MED ORDER — METFORMIN HCL ER 500 MG PO TB24: 1000.0000 mg | ORAL_TABLET | Freq: Two times a day (BID) | ORAL | 3 refills | Status: DC

## 2021-04-04 MED ORDER — BUSPIRONE HCL 30 MG PO TABS: 30.0000 mg | ORAL_TABLET | Freq: Every day | ORAL | 3 refills | Status: DC

## 2021-04-04 MED ORDER — EZETIMIBE 10 MG PO TABS: 10.0000 mg | ORAL_TABLET | Freq: Every day | ORAL | 3 refills | Status: DC

## 2021-04-04 NOTE — Progress Notes (Signed)
BP 120/70   Pulse 70   Ht 5' 2" (1.575 m)   Wt 167 lb (75.8 kg)   SpO2 96%   BMI 30.54 kg/m    Subjective:   Patient ID: Kristy [name], female    DOB: 08-22-1928, 87 y.o.   MRN: [id number]  HPI: Kristy [name] is a 87 y.o. female presenting on 04/04/2021 for Medical Management of Chronic Issues and Diabetes   HPI Type 2 diabetes mellitus Patient comes in today for recheck of his diabetes. Patient has been currently taking glimepiride and metformin, A1c 7.1 today. Patient is currently on an ACE inhibitor/ARB. Patient has not seen an ophthalmologist this year. Patient denies any issues with their feet. The symptom started onset as an adult hypertension hyperlipidemia hypothyroidism ARE RELATED TO DM   Hypothyroidism recheck Patient is coming in for thyroid recheck today as well. They deny any issues with hair changes or heat or cold problems or diarrhea or constipation. They deny any chest pain or palpitations. They are currently on no medication currently because she has been just outside the normal range at times but then sometimes normal, monitoring  Hypertension Patient is currently on lisinopril and metoprolol, and their blood pressure today is 120/70. Patient denies any lightheadedness or dizziness. Patient denies headaches, blurred vision, chest pains, shortness of breath, or weakness. Denies any side effects from medication and is content with current medication.   Hyperlipidemia Patient is coming in for recheck of his hyperlipidemia. The patient is currently taking Zetia and pravastatin. They deny any issues with myalgias or history of liver damage from it. They deny any focal numbness or weakness or chest pain.   Relevant past medical, surgical, family and social history reviewed and updated as indicated. Interim medical history since our last visit reviewed. Allergies and medications reviewed and updated.  Review of Systems  Constitutional:  Negative for chills and fever.   HENT:  Negative for congestion, ear discharge and ear pain.   Eyes:  Negative for visual disturbance.  Respiratory:  Negative for chest tightness and shortness of breath.   Cardiovascular:  Negative for chest pain and leg swelling.  Genitourinary:  Negative for difficulty urinating and dysuria.  Musculoskeletal:  Negative for back pain and gait problem.  Skin:  Negative for rash.  Neurological:  Negative for light-headedness and headaches.  Psychiatric/Behavioral:  Negative for agitation and behavioral problems.   All other systems reviewed and are negative.  Per HPI unless specifically indicated above   Allergies as of 04/04/2021       Reactions   Vioxx [rofecoxib] Other (See Comments)   Ulcers in mouth        Medication List        Accurate as of April 04, 2021  8:45 AM. If you have any questions, ask your nurse or doctor.          busPIRone 30 MG tablet Commonly known as: BUSPAR Take 1 tablet (30 mg total) by mouth at bedtime. What changed: See the new instructions. Changed by: Fransisca Kaufmann , MD   cholecalciferol 1000 units tablet Commonly known as: VITAMIN D Take 1,000 Units by mouth daily.   ezetimibe 10 MG tablet Commonly known as: ZETIA Take 1 tablet (10 mg total) by mouth daily.   glimepiride 4 MG tablet Commonly known as: AMARYL Take 1 tablet (4 mg total) by mouth daily with breakfast.   glucose blood test strip Commonly known as: FREESTYLE TEST STRIPS Check Blood sugar two times  daily   glucose blood test strip Commonly known as: ONE TOUCH ULTRA TEST CHECK BLOOD SUGAR TWO TIMES DAILY   hydroxypropyl methylcellulose / hypromellose 2.5 % ophthalmic solution Commonly known as: ISOPTO TEARS / GONIOVISC Place 1 drop into both eyes 2 (two) times daily as needed (dry eyes).   lisinopril 10 MG tablet Commonly known as: ZESTRIL Take 0.5 tablets (5 mg total) by mouth daily. What changed: See the new instructions. Changed by: Fransisca Kaufmann ,  MD   Lumigan 0.01 % Soln Generic drug: bimatoprost   metFORMIN 500 MG 24 hr tablet Commonly known as: GLUCOPHAGE-XR Take 2 tablets (1,000 mg total) by mouth 2 (two) times daily. What changed: additional instructions Changed by: Fransisca Kaufmann , MD   metoprolol succinate 25 MG 24 hr tablet Commonly known as: TOPROL-XL TAKE 1 TABLET BY MOUTH EVERY DAY   onetouch ultrasoft lancets Check blood sugars twice a day   pravastatin 80 MG tablet Commonly known as: PRAVACHOL TAKE 1 TABLET BY MOUTH EVERYDAY AT BEDTIME   PRESERVISION AREDS 2 PO Take by mouth.         Objective:   BP 120/70   Pulse 70   Ht 5' 2" (1.575 m)   Wt 167 lb (75.8 kg)   SpO2 96%   BMI 30.54 kg/m   Wt Readings from Last 3 Encounters:  04/04/21 167 lb (75.8 kg)  10/05/20 166 lb (75.3 kg)  07/06/20 167 lb (75.8 kg)    Physical Exam Vitals and nursing note reviewed.  Constitutional:      General: She is not in acute distress.    Appearance: She is well-developed. She is not diaphoretic.  Eyes:     Conjunctiva/sclera: Conjunctivae normal.  Cardiovascular:     Rate and Rhythm: Normal rate and regular rhythm.     Heart sounds: Normal heart sounds. No murmur heard. Pulmonary:     Effort: Pulmonary effort is normal. No respiratory distress.     Breath sounds: Normal breath sounds. No wheezing.  Musculoskeletal:        General: No tenderness. Normal range of motion.  Skin:    General: Skin is warm and dry.     Findings: No rash.     Comments: Healing abrasion on left elbow extensor surface, no erythema or signs of infection  Neurological:     Mental Status: She is alert and oriented to person, place, and time.     Coordination: Coordination normal.  Psychiatric:        Behavior: Behavior normal.        Cognition and Memory: Memory is impaired.      Assessment & Plan:   Problem List Items Addressed This Visit       Cardiovascular and Mediastinum   Hypertension associated with diabetes  ([place])   Relevant Medications   metFORMIN (GLUCOPHAGE-XR) 500 MG 24 hr tablet   lisinopril (ZESTRIL) 10 MG tablet   ezetimibe (ZETIA) 10 MG tablet   Other Relevant Orders   CBC with Differential/Platelet   CMP14+EGFR   Lipid panel   TSH   Bayer DCA Hb A1c Waived     Endocrine   Hyperlipidemia associated with type 2 diabetes mellitus (HCC)   Relevant Medications   metFORMIN (GLUCOPHAGE-XR) 500 MG 24 hr tablet   lisinopril (ZESTRIL) 10 MG tablet   ezetimibe (ZETIA) 10 MG tablet   Other Relevant Orders   CBC with Differential/Platelet   CMP14+EGFR   Lipid panel   TSH   Bayer DCA Hb  A1c Waived   Diabetes mellitus type 2 controlled - Primary   Relevant Medications   metFORMIN (GLUCOPHAGE-XR) 500 MG 24 hr tablet   lisinopril (ZESTRIL) 10 MG tablet   Other Relevant Orders   CBC with Differential/Platelet   CMP14+EGFR   Lipid panel   TSH   Bayer DCA Hb A1c Waived   Type 2 diabetes mellitus with stage 3 chronic kidney disease (HCC)   Relevant Medications   metFORMIN (GLUCOPHAGE-XR) 500 MG 24 hr tablet   lisinopril (ZESTRIL) 10 MG tablet     Other   Depression   Relevant Medications   busPIRone (BUSPAR) 30 MG tablet   Other Visit Diagnoses     Hyperthyroidism       Relevant Orders   CBC with Differential/Platelet   CMP14+EGFR   Lipid panel   TSH   Bayer DCA Hb A1c Waived   Need for shingles vaccine       Relevant Orders   Varicella-zoster vaccine IM (Shingrix)       Continue current medication, A1c 7.1, with her age that is pretty satisfactory.  She is having more memory issues and having more incontinence issues but still refuses to use the depends. that is 1 of Issues they are still working on.  She also does not like to use her walker all of the time and has had a couple falls where she scraped her elbow and other things.  They are still trying to encourage that.  She is here with her son who provides most of the history. That follow up plan: Return in about 6  months (around 10/05/2021), or if symptoms worsen or fail to improve, for Thyroid and diabetes and cholesterol and hypertension.  Counseling provided for all of the vaccine components Orders Placed This Encounter  Procedures   Varicella-zoster vaccine IM (Shingrix)   CBC with Differential/Platelet   CMP14+EGFR   Lipid panel   TSH   Bayer DCA Hb A1c Waived    Caryl Pina, MD [facility] 04/04/2021, 8:45 AM

## 2021-04-05 LAB — CMP14+EGFR
ALT: 10 IU/L (ref 0–32)
AST: 14 IU/L (ref 0–40)
Albumin/Globulin Ratio: 2.1 (ref 1.2–2.2)
Albumin: 4.1 g/dL (ref 3.5–4.6)
Alkaline Phosphatase: 58 IU/L (ref 44–121)
BUN/Creatinine Ratio: 10 — ABNORMAL LOW (ref 12–28)
BUN: 12 mg/dL (ref 10–36)
Bilirubin Total: 0.5 mg/dL (ref 0.0–1.2)
CO2: 21 mmol/L (ref 20–29)
Calcium: 9.6 mg/dL (ref 8.7–10.3)
Chloride: 105 mmol/L (ref 96–106)
Creatinine, Ser: 1.22 mg/dL — ABNORMAL HIGH (ref 0.57–1.00)
Globulin, Total: 2 g/dL (ref 1.5–4.5)
Glucose: 159 mg/dL — ABNORMAL HIGH (ref 65–99)
Potassium: 4.7 mmol/L (ref 3.5–5.2)
Sodium: 141 mmol/L (ref 134–144)
Total Protein: 6.1 g/dL (ref 6.0–8.5)
eGFR: 42 mL/min/{1.73_m2} — ABNORMAL LOW (ref >59)

## 2021-04-05 LAB — LIPID PANEL
Chol/HDL Ratio: 2.3 ratio (ref 0.0–4.4)
Cholesterol, Total: 126 mg/dL (ref 100–199)
HDL: 55 mg/dL (ref >39)
LDL Chol Calc (NIH): 50 mg/dL (ref 0–99)
Triglycerides: 118 mg/dL (ref 0–149)
VLDL Cholesterol Cal: 21 mg/dL (ref 5–40)

## 2021-04-05 LAB — CBC WITH DIFFERENTIAL/PLATELET
Basophils Absolute: 0.1 10*3/uL (ref 0.0–0.2)
Basos: 1 %
EOS (ABSOLUTE): 0.2 10*3/uL (ref 0.0–0.4)
Eos: 1 %
Hematocrit: 35.8 % (ref 34.0–46.6)
Hemoglobin: 12.3 g/dL (ref 11.1–15.9)
Immature Grans (Abs): 0 10*3/uL (ref 0.0–0.1)
Immature Granulocytes: 0 %
Lymphocytes Absolute: 8.6 10*3/uL — ABNORMAL HIGH (ref 0.7–3.1)
Lymphs: 58 %
MCH: 30.9 pg (ref 26.6–33.0)
MCHC: 34.4 g/dL (ref 31.5–35.7)
MCV: 90 fL (ref 79–97)
Monocytes Absolute: 1 10*3/uL — ABNORMAL HIGH (ref 0.1–0.9)
Monocytes: 6 %
Neutrophils Absolute: 5.1 10*3/uL (ref 1.4–7.0)
Neutrophils: 34 %
Platelets: 175 10*3/uL (ref 150–450)
RBC: 3.98 x10E6/uL (ref 3.77–5.28)
RDW: 12.9 % (ref 11.7–15.4)
WBC: 15 10*3/uL — ABNORMAL HIGH (ref 3.4–10.8)

## 2021-04-05 LAB — TSH: TSH: 2.62 u[IU]/mL (ref 0.450–4.500)

## 2021-05-03 ENCOUNTER — Other Ambulatory Visit: Payer: Self-pay | Admitting: Family Medicine

## 2021-05-06 ENCOUNTER — Other Ambulatory Visit: Payer: Self-pay | Admitting: Family Medicine

## 2021-06-05 ENCOUNTER — Other Ambulatory Visit: Payer: Self-pay | Admitting: Family Medicine

## 2021-07-30 ENCOUNTER — Ambulatory Visit (INDEPENDENT_AMBULATORY_CARE_PROVIDER_SITE_OTHER): Payer: Medicare Other

## 2021-07-30 DIAGNOSIS — Z23 Encounter for immunization: Secondary | ICD-10-CM

## 2021-07-30 NOTE — Progress Notes (Signed)
Flu injection given in Left deltoid without difficulty

## 2021-08-06 ENCOUNTER — Other Ambulatory Visit: Payer: Self-pay | Admitting: Family Medicine

## 2021-09-17 DIAGNOSIS — H401133 Primary open-angle glaucoma, bilateral, severe stage: Secondary | ICD-10-CM | POA: Diagnosis not present

## 2021-10-05 ENCOUNTER — Ambulatory Visit (INDEPENDENT_AMBULATORY_CARE_PROVIDER_SITE_OTHER): Payer: Medicare Other | Admitting: Family Medicine

## 2021-10-05 ENCOUNTER — Encounter: Payer: Self-pay | Admitting: Family Medicine

## 2021-10-05 ENCOUNTER — Other Ambulatory Visit: Payer: Self-pay

## 2021-10-05 VITALS — BP 140/67 | HR 73 | Ht 62.0 in | Wt 165.0 lb

## 2021-10-05 DIAGNOSIS — E1169 Type 2 diabetes mellitus with other specified complication: Secondary | ICD-10-CM

## 2021-10-05 DIAGNOSIS — E1159 Type 2 diabetes mellitus with other circulatory complications: Secondary | ICD-10-CM | POA: Diagnosis not present

## 2021-10-05 DIAGNOSIS — N1831 Chronic kidney disease, stage 3a: Secondary | ICD-10-CM

## 2021-10-05 DIAGNOSIS — E785 Hyperlipidemia, unspecified: Secondary | ICD-10-CM

## 2021-10-05 DIAGNOSIS — I152 Hypertension secondary to endocrine disorders: Secondary | ICD-10-CM | POA: Diagnosis not present

## 2021-10-05 DIAGNOSIS — E1122 Type 2 diabetes mellitus with diabetic chronic kidney disease: Secondary | ICD-10-CM

## 2021-10-05 DIAGNOSIS — Z23 Encounter for immunization: Secondary | ICD-10-CM

## 2021-10-05 DIAGNOSIS — E0590 Thyrotoxicosis, unspecified without thyrotoxic crisis or storm: Secondary | ICD-10-CM

## 2021-10-05 LAB — BAYER DCA HB A1C WAIVED: HB A1C (BAYER DCA - WAIVED): 7.6 % — ABNORMAL HIGH (ref 4.8–5.6)

## 2021-10-05 MED ORDER — METOPROLOL SUCCINATE ER 25 MG PO TB24: 25.0000 mg | ORAL_TABLET | Freq: Every day | ORAL | 3 refills | Status: DC

## 2021-10-05 MED ORDER — METFORMIN HCL ER 500 MG PO TB24: ORAL_TABLET | ORAL | 3 refills | Status: DC

## 2021-10-05 MED ORDER — PRAVASTATIN SODIUM 80 MG PO TABS: 80.0000 mg | ORAL_TABLET | Freq: Every day | ORAL | 3 refills | Status: DC

## 2021-10-05 NOTE — Progress Notes (Signed)
BP (!) 151/76    Pulse 73    Ht '5\' 2"'  (1.575 m)    Wt 165 lb (74.8 kg)    SpO2 100%    BMI 30.18 kg/m    Subjective:   Patient ID: Kristy [name], female    DOB: 04/17/1928, 88 y.o.   MRN: [id number]  HPI: Kristy [name] is a 88 y.o. female presenting on 10/05/2021 for Medical Management of Chronic Issues, Diabetes, and Hypothyroidism   HPI Type 2 diabetes mellitus Patient comes in today for recheck of his diabetes. Patient has been currently taking glimepiride and metformin. Patient is currently on an ACE inhibitor/ARB. Patient has seen an ophthalmologist this year. Patient denies any issues with their feet. The symptom started onset as an adult CKD 3 and hypertension and hyperlipidemia ARE RELATED TO DM   Hypertension Patient is currently on lisinopril and metoprolol, and their blood pressure today is 151/76. Patient denies any lightheadedness or dizziness. Patient denies headaches, blurred vision, chest pains, shortness of breath, or weakness. Denies any side effects from medication and is content with current medication.   Hyperlipidemia Patient is coming in for recheck of his hyperlipidemia. The patient is currently taking pravastatin and Zetia. They deny any issues with myalgias or history of liver damage from it. They deny any focal numbness or weakness or chest pain.   Relevant past medical, surgical, family and social history reviewed and updated as indicated. Interim medical history since our last visit reviewed. Allergies and medications reviewed and updated.  Review of Systems  Constitutional:  Negative for chills and fever.  Eyes:  Negative for visual disturbance.  Respiratory:  Negative for chest tightness and shortness of breath.   Cardiovascular:  Negative for chest pain and leg swelling.  Musculoskeletal:  Negative for back pain and gait problem.  Skin:  Negative for rash.  Neurological:  Negative for light-headedness and headaches.  Psychiatric/Behavioral:  Positive  for confusion and decreased concentration. Negative for agitation, behavioral problems, self-injury, sleep disturbance and suicidal ideas. The patient is not nervous/anxious.   All other systems reviewed and are negative.  Per HPI unless specifically indicated above   Allergies as of 10/05/2021       Reactions   Vioxx [rofecoxib] Other (See Comments)   Ulcers in mouth        Medication List        Accurate as of October 05, 2021  8:27 AM. If you have any questions, ask your nurse or doctor.          busPIRone 30 MG tablet Commonly known as: BUSPAR Take 1 tablet (30 mg total) by mouth at bedtime.   cholecalciferol 1000 units tablet Commonly known as: VITAMIN D Take 1,000 Units by mouth daily.   ezetimibe 10 MG tablet Commonly known as: ZETIA Take 1 tablet (10 mg total) by mouth daily.   glimepiride 4 MG tablet Commonly known as: AMARYL Take 1 tablet (4 mg total) by mouth daily with breakfast.   glucose blood test strip Commonly known as: FREESTYLE TEST STRIPS Check Blood sugar two times daily   glucose blood test strip Commonly known as: ONE TOUCH ULTRA TEST CHECK BLOOD SUGAR TWO TIMES DAILY   hydroxypropyl methylcellulose / hypromellose 2.5 % ophthalmic solution Commonly known as: ISOPTO TEARS / GONIOVISC Place 1 drop into both eyes 2 (two) times daily as needed (dry eyes).   lisinopril 10 MG tablet Commonly known as: ZESTRIL Take 0.5 tablets (5 mg total) by mouth daily.  Lumigan 0.01 % Soln Generic drug: bimatoprost   metFORMIN 500 MG 24 hr tablet Commonly known as: GLUCOPHAGE-XR TAKE 2 TABLETS BY MOUTH EVERY DAY WITH BREAKFAST   metoprolol succinate 25 MG 24 hr tablet Commonly known as: TOPROL-XL Take 1 tablet (25 mg total) by mouth daily.   onetouch ultrasoft lancets Check blood sugars twice a day   pravastatin 80 MG tablet Commonly known as: PRAVACHOL Take 1 tablet (80 mg total) by mouth at bedtime. What changed: See the new  instructions. Changed by: Worthy Rancher, MD   PRESERVISION AREDS 2 PO Take by mouth.         Objective:   BP (!) 151/76    Pulse 73    Ht '5\' 2"'  (1.575 m)    Wt 165 lb (74.8 kg)    SpO2 100%    BMI 30.18 kg/m   Wt Readings from Last 3 Encounters:  10/05/21 165 lb (74.8 kg)  04/04/21 167 lb (75.8 kg)  10/05/20 166 lb (75.3 kg)    Physical Exam Vitals and nursing note reviewed.  Constitutional:      General: She is not in acute distress.    Appearance: She is well-developed. She is not diaphoretic.  Eyes:     Conjunctiva/sclera: Conjunctivae normal.  Cardiovascular:     Rate and Rhythm: Normal rate and regular rhythm.     Heart sounds: Normal heart sounds. No murmur heard. Pulmonary:     Effort: Pulmonary effort is normal. No respiratory distress.     Breath sounds: Normal breath sounds. No wheezing.  Musculoskeletal:        General: No tenderness. Normal range of motion.  Skin:    General: Skin is warm and dry.     Findings: No rash.  Neurological:     Mental Status: She is alert and oriented to person, place, and time.     Coordination: Coordination normal.  Psychiatric:        Mood and Affect: Mood is not anxious or depressed.        Behavior: Behavior normal.        Cognition and Memory: Memory is impaired. She exhibits impaired recent memory.      Assessment & Plan:   Problem List Items Addressed This Visit       Cardiovascular and Mediastinum   Hypertension associated with diabetes ([place])   Relevant Medications   metFORMIN (GLUCOPHAGE-XR) 500 MG 24 hr tablet   metoprolol succinate (TOPROL-XL) 25 MG 24 hr tablet   pravastatin (PRAVACHOL) 80 MG tablet   Other Relevant Orders   CBC with Differential/Platelet   CMP14+EGFR   Lipid panel   TSH   Bayer DCA Hb A1c Waived     Endocrine   Hyperlipidemia associated with type 2 diabetes mellitus (HCC)   Relevant Medications   metFORMIN (GLUCOPHAGE-XR) 500 MG 24 hr tablet   metoprolol succinate  (TOPROL-XL) 25 MG 24 hr tablet   pravastatin (PRAVACHOL) 80 MG tablet   Other Relevant Orders   CBC with Differential/Platelet   CMP14+EGFR   Lipid panel   TSH   Bayer DCA Hb A1c Waived   Diabetes mellitus type 2 controlled - Primary   Relevant Medications   metFORMIN (GLUCOPHAGE-XR) 500 MG 24 hr tablet   pravastatin (PRAVACHOL) 80 MG tablet   Other Relevant Orders   CBC with Differential/Platelet   CMP14+EGFR   Lipid panel   TSH   Bayer DCA Hb A1c Waived   Type 2 diabetes mellitus with stage  3 chronic kidney disease (HCC)   Relevant Medications   metFORMIN (GLUCOPHAGE-XR) 500 MG 24 hr tablet   pravastatin (PRAVACHOL) 80 MG tablet   Other Visit Diagnoses     Hyperthyroidism       Relevant Medications   metoprolol succinate (TOPROL-XL) 25 MG 24 hr tablet   Other Relevant Orders   CBC with Differential/Platelet   CMP14+EGFR   Lipid panel   TSH   Bayer DCA Hb A1c Waived   Need for shingles vaccine       Relevant Orders   Varicella-zoster vaccine IM (Shingrix)       A1c 7.6, slightly elevated, blood pressure slightly elevated, due to age and dementia we are not going to be aggressive with her.  No changes in medication. Follow up plan: Return in about 6 months (around 04/04/2022), or if symptoms worsen or fail to improve, for Diabetes and hypertension and cholesterol and dementia.  Counseling provided for all of the vaccine components Orders Placed This Encounter  Procedures   Varicella-zoster vaccine IM (Shingrix)   CBC with Differential/Platelet   CMP14+EGFR   Lipid panel   TSH   Bayer DCA Hb A1c Waived    Caryl Pina, MD [facility] 10/05/2021, 8:27 AM

## 2021-10-06 LAB — CMP14+EGFR
ALT: 10 IU/L (ref 0–32)
AST: 18 IU/L (ref 0–40)
Albumin/Globulin Ratio: 1.7 (ref 1.2–2.2)
Albumin: 4 g/dL (ref 3.5–4.6)
Alkaline Phosphatase: 63 IU/L (ref 44–121)
BUN/Creatinine Ratio: 8 — ABNORMAL LOW (ref 12–28)
BUN: 10 mg/dL (ref 10–36)
Bilirubin Total: 0.3 mg/dL (ref 0.0–1.2)
CO2: 22 mmol/L (ref 20–29)
Calcium: 9.6 mg/dL (ref 8.7–10.3)
Chloride: 104 mmol/L (ref 96–106)
Creatinine, Ser: 1.2 mg/dL — ABNORMAL HIGH (ref 0.57–1.00)
Globulin, Total: 2.3 g/dL (ref 1.5–4.5)
Glucose: 169 mg/dL — ABNORMAL HIGH (ref 70–99)
Potassium: 4.7 mmol/L (ref 3.5–5.2)
Sodium: 139 mmol/L (ref 134–144)
Total Protein: 6.3 g/dL (ref 6.0–8.5)
eGFR: 43 mL/min/{1.73_m2} — ABNORMAL LOW (ref >59)

## 2021-10-06 LAB — LIPID PANEL
Chol/HDL Ratio: 2.5 ratio (ref 0.0–4.4)
Cholesterol, Total: 127 mg/dL (ref 100–199)
HDL: 51 mg/dL (ref >39)
LDL Chol Calc (NIH): 47 mg/dL (ref 0–99)
Triglycerides: 177 mg/dL — ABNORMAL HIGH (ref 0–149)
VLDL Cholesterol Cal: 29 mg/dL (ref 5–40)

## 2021-10-06 LAB — CBC WITH DIFFERENTIAL/PLATELET
Basophils Absolute: 0.1 10*3/uL (ref 0.0–0.2)
Basos: 0 %
EOS (ABSOLUTE): 0.4 10*3/uL (ref 0.0–0.4)
Eos: 3 %
Hematocrit: 37.9 % (ref 34.0–46.6)
Hemoglobin: 12.5 g/dL (ref 11.1–15.9)
Immature Grans (Abs): 0 10*3/uL (ref 0.0–0.1)
Immature Granulocytes: 0 %
Lymphocytes Absolute: 9.2 10*3/uL — ABNORMAL HIGH (ref 0.7–3.1)
Lymphs: 55 %
MCH: 29.8 pg (ref 26.6–33.0)
MCHC: 33 g/dL (ref 31.5–35.7)
MCV: 90 fL (ref 79–97)
Monocytes Absolute: 0.9 10*3/uL (ref 0.1–0.9)
Monocytes: 6 %
Neutrophils Absolute: 6 10*3/uL (ref 1.4–7.0)
Neutrophils: 36 %
Platelets: 198 10*3/uL (ref 150–450)
RBC: 4.2 x10E6/uL (ref 3.77–5.28)
RDW: 12.8 % (ref 11.7–15.4)
WBC: 16.6 10*3/uL — ABNORMAL HIGH (ref 3.4–10.8)

## 2021-10-06 LAB — TSH: TSH: 1.6 u[IU]/mL (ref 0.450–4.500)

## 2021-10-11 ENCOUNTER — Other Ambulatory Visit: Payer: Self-pay | Admitting: Family Medicine

## 2021-10-23 ENCOUNTER — Telehealth: Payer: Self-pay | Admitting: Family Medicine

## 2021-10-23 NOTE — Telephone Encounter (Signed)
Called to schedule AWV

## 2021-10-29 DIAGNOSIS — H401133 Primary open-angle glaucoma, bilateral, severe stage: Secondary | ICD-10-CM | POA: Diagnosis not present

## 2021-11-14 ENCOUNTER — Encounter: Payer: Self-pay | Admitting: *Deleted

## 2022-03-14 DIAGNOSIS — R262 Difficulty in walking, not elsewhere classified: Secondary | ICD-10-CM | POA: Diagnosis not present

## 2022-03-15 DIAGNOSIS — R262 Difficulty in walking, not elsewhere classified: Secondary | ICD-10-CM | POA: Diagnosis not present

## 2022-03-18 DIAGNOSIS — R262 Difficulty in walking, not elsewhere classified: Secondary | ICD-10-CM | POA: Diagnosis not present

## 2022-03-19 ENCOUNTER — Emergency Department (HOSPITAL_COMMUNITY)
Admission: EM | Admit: 2022-03-19 | Discharge: 2022-03-19 | Disposition: A | Discharge status: Home / Self Care | Payer: Medicare Other | Attending: Emergency Medicine | Admitting: Emergency Medicine

## 2022-03-19 ENCOUNTER — Encounter (HOSPITAL_COMMUNITY): Payer: Self-pay | Admitting: Emergency Medicine

## 2022-03-19 ENCOUNTER — Emergency Department (HOSPITAL_COMMUNITY): Payer: Medicare Other

## 2022-03-19 ENCOUNTER — Other Ambulatory Visit: Payer: Self-pay

## 2022-03-19 DIAGNOSIS — Z79899 Other long term (current) drug therapy: Secondary | ICD-10-CM | POA: Insufficient documentation

## 2022-03-19 DIAGNOSIS — E119 Type 2 diabetes mellitus without complications: Secondary | ICD-10-CM | POA: Diagnosis not present

## 2022-03-19 DIAGNOSIS — Z7984 Long term (current) use of oral hypoglycemic drugs: Secondary | ICD-10-CM | POA: Insufficient documentation

## 2022-03-19 DIAGNOSIS — Z23 Encounter for immunization: Secondary | ICD-10-CM | POA: Insufficient documentation

## 2022-03-19 DIAGNOSIS — S13140A Subluxation of C3/C4 cervical vertebrae, initial encounter: Secondary | ICD-10-CM | POA: Diagnosis not present

## 2022-03-19 DIAGNOSIS — M25461 Effusion, right knee: Secondary | ICD-10-CM | POA: Diagnosis not present

## 2022-03-19 DIAGNOSIS — R410 Disorientation, unspecified: Secondary | ICD-10-CM | POA: Insufficient documentation

## 2022-03-19 DIAGNOSIS — S022XXA Fracture of nasal bones, initial encounter for closed fracture: Secondary | ICD-10-CM | POA: Diagnosis not present

## 2022-03-19 DIAGNOSIS — S0181XA Laceration without foreign body of other part of head, initial encounter: Secondary | ICD-10-CM

## 2022-03-19 DIAGNOSIS — Y92129 Unspecified place in nursing home as the place of occurrence of the external cause: Secondary | ICD-10-CM | POA: Diagnosis not present

## 2022-03-19 DIAGNOSIS — M2578 Osteophyte, vertebrae: Secondary | ICD-10-CM | POA: Diagnosis not present

## 2022-03-19 DIAGNOSIS — W19XXXA Unspecified fall, initial encounter: Secondary | ICD-10-CM

## 2022-03-19 DIAGNOSIS — D519 Vitamin B12 deficiency anemia, unspecified: Secondary | ICD-10-CM | POA: Diagnosis not present

## 2022-03-19 DIAGNOSIS — R9431 Abnormal electrocardiogram [ECG] [EKG]: Secondary | ICD-10-CM | POA: Diagnosis not present

## 2022-03-19 DIAGNOSIS — W1839XA Other fall on same level, initial encounter: Secondary | ICD-10-CM | POA: Insufficient documentation

## 2022-03-19 DIAGNOSIS — S0121XA Laceration without foreign body of nose, initial encounter: Secondary | ICD-10-CM | POA: Diagnosis not present

## 2022-03-19 DIAGNOSIS — E559 Vitamin D deficiency, unspecified: Secondary | ICD-10-CM | POA: Diagnosis not present

## 2022-03-19 DIAGNOSIS — Z96651 Presence of right artificial knee joint: Secondary | ICD-10-CM | POA: Insufficient documentation

## 2022-03-19 DIAGNOSIS — R609 Edema, unspecified: Secondary | ICD-10-CM | POA: Diagnosis not present

## 2022-03-19 DIAGNOSIS — F0390 Unspecified dementia without behavioral disturbance: Secondary | ICD-10-CM | POA: Insufficient documentation

## 2022-03-19 DIAGNOSIS — R262 Difficulty in walking, not elsewhere classified: Secondary | ICD-10-CM | POA: Diagnosis not present

## 2022-03-19 DIAGNOSIS — Z471 Aftercare following joint replacement surgery: Secondary | ICD-10-CM | POA: Diagnosis not present

## 2022-03-19 DIAGNOSIS — S0992XA Unspecified injury of nose, initial encounter: Secondary | ICD-10-CM | POA: Diagnosis present

## 2022-03-19 DIAGNOSIS — D649 Anemia, unspecified: Secondary | ICD-10-CM | POA: Diagnosis not present

## 2022-03-19 DIAGNOSIS — I959 Hypotension, unspecified: Secondary | ICD-10-CM | POA: Diagnosis not present

## 2022-03-19 DIAGNOSIS — M47812 Spondylosis without myelopathy or radiculopathy, cervical region: Secondary | ICD-10-CM | POA: Diagnosis not present

## 2022-03-19 HISTORY — DX: Unspecified dementia, unspecified severity, without behavioral disturbance, psychotic disturbance, mood disturbance, and anxiety: F03.90

## 2022-03-19 MED ORDER — TETANUS-DIPHTH-ACELL PERTUSSIS 5-2.5-18.5 LF-MCG/0.5 IM SUSY
0.5000 mL | PREFILLED_SYRINGE | Freq: Once | INTRAMUSCULAR | Status: AC
Start: 2022-03-19 — End: 2022-03-19
  Administered 2022-03-19: 0.5 mL via INTRAMUSCULAR
  Filled 2022-03-19: qty 0.5

## 2022-03-19 MED ORDER — LIDOCAINE-EPINEPHRINE (PF) 2 %-1:200000 IJ SOLN
INTRAMUSCULAR | Status: AC
  Administered 2022-03-19: 20 mL
  Filled 2022-03-19: qty 20

## 2022-03-19 MED ORDER — POVIDONE-IODINE 10 % EX SOLN
CUTANEOUS | Status: AC
  Filled 2022-03-19: qty 14.8

## 2022-03-19 NOTE — ED Notes (Signed)
C-con notified of patient needing transportation back ti [facility].

## 2022-03-19 NOTE — ED Provider Notes (Signed)
[facility] EMERGENCY DEPARTMENT Provider Note   CSN: [id number] Arrival date & time: 03/19/22  0110     History  Chief Complaint  Patient presents with   Lytle Michaels    Kristy [name] is a 88 y.o. female.  Patient presents to the emergency department for evaluation after a fall.  Patient had unwitnessed fall at nursing home.  She has a history of dementia, cannot provide any information.       Home Medications Prior to Admission medications   Medication Sig Start Date End Date Taking? Authorizing Provider  busPIRone (BUSPAR) 30 MG tablet Take 1 tablet (30 mg total) by mouth at bedtime. 04/04/21   Dettinger, Fransisca Kaufmann, MD  cholecalciferol (VITAMIN D) 1000 UNITS tablet Take 1,000 Units by mouth daily.    [provider]  ezetimibe (ZETIA) 10 MG tablet Take 1 tablet (10 mg total) by mouth daily. 04/04/21   Dettinger, Fransisca Kaufmann, MD  glimepiride (AMARYL) 4 MG tablet TAKE 1 TABLET BY MOUTH DAILY WITH BREAKFAST 10/11/21   Dettinger, Fransisca Kaufmann, MD  glucose blood (FREESTYLE TEST STRIPS) test strip Check Blood sugar two times daily 08/24/18   Chipper Herb, MD  glucose blood (ONE TOUCH ULTRA TEST) test strip CHECK BLOOD SUGAR TWO TIMES DAILY 08/25/18   Chipper Herb, MD  hydroxypropyl methylcellulose (ISOPTO TEARS) 2.5 % ophthalmic solution Place 1 drop into both eyes 2 (two) times daily as needed (dry eyes).    [provider]  Lancets [facility] ULTRASOFT) lancets Check blood sugars twice a day 09/11/18   Chipper Herb, MD  lisinopril (ZESTRIL) 10 MG tablet Take 0.5 tablets (5 mg total) by mouth daily. 04/04/21   Dettinger, Fransisca Kaufmann, MD  LUMIGAN 0.01 % SOLN  11/09/15   [provider]  metFORMIN (GLUCOPHAGE-XR) 500 MG 24 hr tablet TAKE 2 TABLETS BY MOUTH EVERY DAY WITH BREAKFAST 10/05/21   Dettinger, Fransisca Kaufmann, MD  metoprolol succinate (TOPROL-XL) 25 MG 24 hr tablet Take 1 tablet (25 mg total) by mouth daily. 10/05/21   Dettinger, Fransisca Kaufmann, MD  Multiple Vitamins-Minerals  (PRESERVISION AREDS 2 PO) Take by mouth.    [provider]  pravastatin (PRAVACHOL) 80 MG tablet Take 1 tablet (80 mg total) by mouth at bedtime. 10/05/21   Dettinger, Fransisca Kaufmann, MD      Allergies    Vioxx [rofecoxib]    Review of Systems   Review of Systems  Physical Exam Updated Vital Signs BP (!) 142/59   Pulse 88   Temp (!) 97.4 F (36.3 C) (Oral)   Resp (!) 23   Ht '5\' 2"'$  (1.575 m)   Wt 75 kg   SpO2 99%   BMI 30.24 kg/m  Physical Exam Vitals and nursing note reviewed.  Constitutional:      General: She is not in acute distress.    Appearance: She is well-developed.  HENT:     Head: Normocephalic. Laceration (Right forehead, nose) present.     Mouth/Throat:     Mouth: Mucous membranes are moist.  Eyes:     General: Vision grossly intact. Gaze aligned appropriately.     Extraocular Movements: Extraocular movements intact.     Conjunctiva/sclera: Conjunctivae normal.  Cardiovascular:     Rate and Rhythm: Normal rate and regular rhythm.     Pulses: Normal pulses.     Heart sounds: Normal heart sounds, S1 normal and S2 normal. No murmur heard.    No friction rub. No gallop.  Pulmonary:  Effort: Pulmonary effort is normal. No respiratory distress.     Breath sounds: Normal breath sounds.  Abdominal:     General: Bowel sounds are normal.     Palpations: Abdomen is soft.     Tenderness: There is no abdominal tenderness. There is no guarding or rebound.     Hernia: No hernia is present.  Musculoskeletal:        General: No swelling.     Right shoulder: Normal.     Left shoulder: Normal.     Right elbow: Normal.     Left elbow: Normal.     Right wrist: Normal.     Left wrist: Normal.     Cervical back: Full passive range of motion without pain, normal range of motion and neck supple. No spinous process tenderness or muscular tenderness. Normal range of motion.     Right hip: Normal.     Left hip: Normal.     Right knee: Swelling present. Tenderness  present.     Left knee: Normal.     Right lower leg: No edema.     Left lower leg: No edema.  Skin:    General: Skin is warm and dry.     Capillary Refill: Capillary refill takes less than 2 seconds.     Findings: Laceration present. No ecchymosis, erythema, rash or wound.  Neurological:     General: No focal deficit present.     Mental Status: She is alert. Mental status is at baseline. She is confused.     GCS: GCS eye subscore is 4. GCS verbal subscore is 5. GCS motor subscore is 6.     Cranial Nerves: Cranial nerves 2-12 are intact.     Sensory: Sensation is intact.     Motor: Motor function is intact.     Coordination: Coordination is intact.     ED Results / Procedures / Treatments   Labs (all labs ordered are listed, but only abnormal results are displayed) Labs Reviewed - No data to display  EKG EKG Interpretation  Date/Time:  Tuesday March 19 2022 01:23:16 EDT Ventricular Rate:  81 PR Interval:  139 QRS Duration: 99 QT Interval:  385 QTC Calculation: 447 R Axis:   -18 Text Interpretation: Sinus rhythm Borderline left axis deviation Low voltage, precordial leads Baseline wander in lead(s) V5 Confirmed by Orpah Greek [phone]) on 03/19/2022 2:49:09 AM  Radiology CT CERVICAL SPINE WO CONTRAST  Result Date: 03/19/2022 CLINICAL DATA:  Neck trauma.  Unwitnessed fall. EXAM: CT CERVICAL SPINE WITHOUT CONTRAST TECHNIQUE: Multidetector CT imaging of the cervical spine was performed without intravenous contrast. Multiplanar CT image reconstructions were also generated. RADIATION DOSE REDUCTION: This exam was performed according to the departmental dose-optimization program which includes automated exposure control, adjustment of the mA and/or kV according to patient size and/or use of iterative reconstruction technique. COMPARISON:  Cervical spine radiographs 10/23/2015 FINDINGS: Alignment: Slight anterior subluxation at C3-4 and C4-5 levels are likely degenerative. Normal  alignment of the facet joints. C1-2 articulation appears intact. Skull base and vertebrae: Skull base appears intact. No vertebral compression deformities. No focal bone lesion or bone destruction. Soft tissues and spinal canal: No prevertebral soft tissue swelling. No abnormal paraspinal soft tissue mass or infiltration. Disc levels: Degenerative changes throughout the cervical spine with narrowed disc spaces and endplate osteophyte formation throughout. Degenerative changes in the posterior facet joints. Uncovertebral spurring causes encroachment upon the neural foramina at multiple levels. Prominent disc osteophyte complexes at C3-4, C4-5, and  C5-6 levels cause some encroachment upon the central canal. Upper chest: Lung apices are clear. Other: Vascular calcifications. IMPRESSION: Slight anterior subluxations at C3-4 and C4-5, likely degenerative. No acute displaced fractures identified. Diffuse degenerative change throughout the cervical spine. Electronically Signed   By: Lucienne Capers M.D.   On: 03/19/2022 02:47   CT MAXILLOFACIAL WO CONTRAST  Result Date: 03/19/2022 CLINICAL DATA:  Blunt facial trauma. Unwitnessed fall. Laceration to the forehead. EXAM: CT MAXILLOFACIAL WITHOUT CONTRAST TECHNIQUE: Multidetector CT imaging of the maxillofacial structures was performed. Multiplanar CT image reconstructions were also generated. RADIATION DOSE REDUCTION: This exam was performed according to the departmental dose-optimization program which includes automated exposure control, adjustment of the mA and/or kV according to patient size and/or use of iterative reconstruction technique. COMPARISON:  None Available. FINDINGS: Osseous: Comminuted and depressed nasal fractures, displaced towards the left. Nasal septum appears intact. Orbital rims, maxillary antral walls, zygomatic arches, pterygoid plates, mandibles, and temporomandibular joints appear intact. Motion artifact limits the examination. Orbits: The  globes and extraocular muscles appear intact and symmetrical. Sinuses: Paranasal sinuses are clear. Soft tissues: Mild soft tissue swelling over the nasal bridge and right infraorbital region. Limited intracranial: No acute abnormalities. IMPRESSION: Comminuted and displaced nasal bone fractures. Electronically Signed   By: Lucienne Capers M.D.   On: 03/19/2022 02:42   CT HEAD WO CONTRAST (5MM)  Result Date: 03/19/2022 CLINICAL DATA:  Minor head trauma. Unwitnessed fall with laceration to the forehead. History of dementia and stroke. EXAM: CT HEAD WITHOUT CONTRAST TECHNIQUE: Contiguous axial images were obtained from the base of the skull through the vertex without intravenous contrast. RADIATION DOSE REDUCTION: This exam was performed according to the departmental dose-optimization program which includes automated exposure control, adjustment of the mA and/or kV according to patient size and/or use of iterative reconstruction technique. COMPARISON:  CT head 10/28/2017.  MRI brain 06/13/2018 FINDINGS: Brain: Diffuse cerebral atrophy. Ventricular dilatation consistent with central atrophy. Low-attenuation changes in the deep white matter consistent with small vessel ischemia. No abnormal extra-axial fluid collections. No mass effect or midline shift. Gray-white matter junctions are distinct. Basal cisterns are not effaced. No acute intracranial hemorrhage. Vascular: No hyperdense vessel or unexpected calcification. Skull: Normal. Negative for fracture or focal lesion. Sinuses/Orbits: Depressed nasal bone fractures. Paranasal sinuses and mastoid air cells are clear. Other: None. IMPRESSION: No acute intracranial abnormalities. Diffuse cerebral atrophy. Low-attenuation changes consistent with small vessel ischemia. Electronically Signed   By: Lucienne Capers M.D.   On: 03/19/2022 02:40   DG Knee Complete 4 Views Right  Result Date: 03/19/2022 CLINICAL DATA:  Recent fall with knee pain, initial encounter EXAM:  RIGHT KNEE - COMPLETE 4+ VIEW COMPARISON:  None Available. FINDINGS: Right knee prosthesis is noted. No acute periprosthetic fracture is seen. Diffuse vascular calcifications are noted. Mild soft tissue swelling is noted anteriorly related to the injury. IMPRESSION: Status post knee replacement.  No acute bony abnormality is seen. Electronically Signed   By: Inez Catalina M.D.   On: 03/19/2022 02:38    Procedures .Marland KitchenLaceration Repair  Date/Time: 03/19/2022 3:52 AM  Performed by: Orpah Greek, MD Authorized by: Orpah Greek, MD   Consent:    Consent obtained:  Emergent situation   Consent given by:  Patient   Risks, benefits, and alternatives were discussed: yes     Risks discussed:  Infection, pain and poor cosmetic result Universal protocol:    Procedure explained and questions answered to patient or proxy's satisfaction: yes  Relevant documents present and verified: yes     Test results available: yes     Imaging studies available: yes     Required blood products, implants, devices, and special equipment available: yes     Site/side marked: yes     Immediately prior to procedure, a time out was called: yes     Patient identity confirmed:  Hospital-assigned identification number Anesthesia:    Anesthesia method:  Local infiltration   Local anesthetic:  Lidocaine 2% WITH epi Laceration details:    Location:  Face   Face location:  Forehead   Length (cm):  2.5 (contused, macerated and stellate) Exploration:    Contaminated: no   Treatment:    Area cleansed with:  Chlorhexidine   Irrigation solution:  Sterile saline   Irrigation volume:  250   Irrigation method:  Syringe Skin repair:    Repair method:  Sutures   Suture size:  5-0   Suture material:  Fast-absorbing gut   Suture technique:  Simple interrupted   Number of sutures:  5 Approximation:    Approximation:  Close Repair type:    Repair type:  Simple Post-procedure details:    Dressing:  Open  (no dressing)   Procedure completion:  Tolerated well, no immediate complications     Medications Ordered in ED Medications  Tdap (BOOSTRIX) injection 0.5 mL (0.5 mLs Intramuscular Given 03/19/22 0259)  povidone-iodine (BETADINE) 10 % external solution (  Given 03/19/22 0300)  lidocaine-EPINEPHrine (XYLOCAINE W/EPI) 2 %-1:200000 (PF) injection (20 mLs  Given by Other 03/19/22 0259)    ED Course/ Medical Decision Making/ A&P                           Medical Decision Making Amount and/or Complexity of Data Reviewed Radiology: ordered.  Risk Prescription drug management.   Patient presents to the emergency department for evaluation after an unwitnessed fall.  Patient has a history of dementia, is in a skilled nursing facility.  Patient presents to the emergency department with a laceration to the right side of her forehead and across the bridge of her nose.  At arrival, patient had no complaints.  She was noted to have some swelling of her right knee, otherwise no other extremity injury.  Hips mobile and no obvious deformity.  Patient underwent CT head, cervical spine, maxillofacial bones.  She has a nasal fracture, otherwise no injuries.  Patient had x-ray of her right knee that showed knee replacement, no acute problems.  Patient has done well.  Forehead laceration repaired with fast absorbing plain gut.  Very small laceration on the bridge of her nose cleaned and Dermabond applied.        Final Clinical Impression(s) / ED Diagnoses Final diagnoses:  Facial laceration, initial encounter    Rx / DC Orders ED Discharge Orders     None         Zayed Griffie, Gwenyth Allegra, MD 03/19/22 [id number]

## 2022-03-19 NOTE — ED Triage Notes (Signed)
Pt had unwitnessed fall at nursing home. She has a laceration to the forehead.

## 2022-03-20 DIAGNOSIS — R262 Difficulty in walking, not elsewhere classified: Secondary | ICD-10-CM | POA: Diagnosis not present

## 2022-03-21 DIAGNOSIS — R262 Difficulty in walking, not elsewhere classified: Secondary | ICD-10-CM | POA: Diagnosis not present

## 2022-03-22 DIAGNOSIS — R262 Difficulty in walking, not elsewhere classified: Secondary | ICD-10-CM | POA: Diagnosis not present

## 2022-03-25 DIAGNOSIS — R262 Difficulty in walking, not elsewhere classified: Secondary | ICD-10-CM | POA: Diagnosis not present

## 2022-03-26 DIAGNOSIS — R262 Difficulty in walking, not elsewhere classified: Secondary | ICD-10-CM | POA: Diagnosis not present

## 2022-03-27 DIAGNOSIS — R262 Difficulty in walking, not elsewhere classified: Secondary | ICD-10-CM | POA: Diagnosis not present

## 2022-03-29 DIAGNOSIS — R262 Difficulty in walking, not elsewhere classified: Secondary | ICD-10-CM | POA: Diagnosis not present

## 2022-03-30 DIAGNOSIS — R262 Difficulty in walking, not elsewhere classified: Secondary | ICD-10-CM | POA: Diagnosis not present

## 2022-04-02 DIAGNOSIS — R262 Difficulty in walking, not elsewhere classified: Secondary | ICD-10-CM | POA: Diagnosis not present

## 2022-04-03 DIAGNOSIS — R262 Difficulty in walking, not elsewhere classified: Secondary | ICD-10-CM | POA: Diagnosis not present

## 2022-04-04 ENCOUNTER — Ambulatory Visit: Payer: Medicare Other | Admitting: Family Medicine

## 2022-04-04 ENCOUNTER — Other Ambulatory Visit: Payer: Self-pay | Admitting: Family Medicine

## 2022-04-04 DIAGNOSIS — R262 Difficulty in walking, not elsewhere classified: Secondary | ICD-10-CM | POA: Diagnosis not present

## 2022-04-04 NOTE — Telephone Encounter (Signed)
RFd d/t pt's recent admission to [facility] after ED

## 2022-04-05 DIAGNOSIS — R262 Difficulty in walking, not elsewhere classified: Secondary | ICD-10-CM | POA: Diagnosis not present

## 2022-04-06 DIAGNOSIS — R262 Difficulty in walking, not elsewhere classified: Secondary | ICD-10-CM | POA: Diagnosis not present

## 2022-04-08 ENCOUNTER — Ambulatory Visit: Payer: Medicare Other | Admitting: Family Medicine

## 2022-04-08 DIAGNOSIS — R262 Difficulty in walking, not elsewhere classified: Secondary | ICD-10-CM | POA: Diagnosis not present

## 2022-04-22 ENCOUNTER — Emergency Department (HOSPITAL_COMMUNITY): Payer: Medicare Other

## 2022-04-22 ENCOUNTER — Other Ambulatory Visit: Payer: Self-pay

## 2022-04-22 ENCOUNTER — Encounter (HOSPITAL_COMMUNITY): Payer: Self-pay | Admitting: Internal Medicine

## 2022-04-22 ENCOUNTER — Inpatient Hospital Stay (HOSPITAL_COMMUNITY)
Admission: EM | Admit: 2022-04-22 | Discharge: 2022-04-26 | DRG: 481 | Disposition: A | Discharge status: Skilled Nursing Facility | Payer: Medicare Other | Source: Skilled Nursing Facility | Attending: Internal Medicine | Admitting: Internal Medicine

## 2022-04-22 DIAGNOSIS — R279 Unspecified lack of coordination: Secondary | ICD-10-CM | POA: Diagnosis not present

## 2022-04-22 DIAGNOSIS — E875 Hyperkalemia: Secondary | ICD-10-CM | POA: Diagnosis present

## 2022-04-22 DIAGNOSIS — Z825 Family history of asthma and other chronic lower respiratory diseases: Secondary | ICD-10-CM

## 2022-04-22 DIAGNOSIS — Z8249 Family history of ischemic heart disease and other diseases of the circulatory system: Secondary | ICD-10-CM | POA: Diagnosis not present

## 2022-04-22 DIAGNOSIS — H40059 Ocular hypertension, unspecified eye: Secondary | ICD-10-CM | POA: Diagnosis present

## 2022-04-22 DIAGNOSIS — H40053 Ocular hypertension, bilateral: Secondary | ICD-10-CM

## 2022-04-22 DIAGNOSIS — Z981 Arthrodesis status: Secondary | ICD-10-CM

## 2022-04-22 DIAGNOSIS — S0990XA Unspecified injury of head, initial encounter: Secondary | ICD-10-CM | POA: Diagnosis not present

## 2022-04-22 DIAGNOSIS — Z79899 Other long term (current) drug therapy: Secondary | ICD-10-CM | POA: Diagnosis not present

## 2022-04-22 DIAGNOSIS — S7292XD Unspecified fracture of left femur, subsequent encounter for closed fracture with routine healing: Secondary | ICD-10-CM | POA: Diagnosis not present

## 2022-04-22 DIAGNOSIS — Z66 Do not resuscitate: Secondary | ICD-10-CM | POA: Diagnosis present

## 2022-04-22 DIAGNOSIS — F32A Depression, unspecified: Secondary | ICD-10-CM | POA: Diagnosis not present

## 2022-04-22 DIAGNOSIS — I10 Essential (primary) hypertension: Secondary | ICD-10-CM

## 2022-04-22 DIAGNOSIS — E1121 Type 2 diabetes mellitus with diabetic nephropathy: Secondary | ICD-10-CM

## 2022-04-22 DIAGNOSIS — E611 Iron deficiency: Secondary | ICD-10-CM | POA: Diagnosis not present

## 2022-04-22 DIAGNOSIS — Z743 Need for continuous supervision: Secondary | ICD-10-CM | POA: Diagnosis not present

## 2022-04-22 DIAGNOSIS — G319 Degenerative disease of nervous system, unspecified: Secondary | ICD-10-CM | POA: Diagnosis not present

## 2022-04-22 DIAGNOSIS — G4700 Insomnia, unspecified: Secondary | ICD-10-CM | POA: Diagnosis not present

## 2022-04-22 DIAGNOSIS — F03918 Unspecified dementia, unspecified severity, with other behavioral disturbance: Secondary | ICD-10-CM | POA: Diagnosis not present

## 2022-04-22 DIAGNOSIS — Z90710 Acquired absence of both cervix and uterus: Secondary | ICD-10-CM

## 2022-04-22 DIAGNOSIS — Z9049 Acquired absence of other specified parts of digestive tract: Secondary | ICD-10-CM

## 2022-04-22 DIAGNOSIS — Y92128 Other place in nursing home as the place of occurrence of the external cause: Secondary | ICD-10-CM | POA: Diagnosis not present

## 2022-04-22 DIAGNOSIS — R195 Other fecal abnormalities: Secondary | ICD-10-CM | POA: Diagnosis not present

## 2022-04-22 DIAGNOSIS — S7222XA Displaced subtrochanteric fracture of left femur, initial encounter for closed fracture: Principal | ICD-10-CM | POA: Diagnosis present

## 2022-04-22 DIAGNOSIS — D519 Vitamin B12 deficiency anemia, unspecified: Secondary | ICD-10-CM | POA: Diagnosis not present

## 2022-04-22 DIAGNOSIS — E7800 Pure hypercholesterolemia, unspecified: Secondary | ICD-10-CM | POA: Diagnosis not present

## 2022-04-22 DIAGNOSIS — Z823 Family history of stroke: Secondary | ICD-10-CM | POA: Diagnosis not present

## 2022-04-22 DIAGNOSIS — Z8673 Personal history of transient ischemic attack (TIA), and cerebral infarction without residual deficits: Secondary | ICD-10-CM | POA: Diagnosis not present

## 2022-04-22 DIAGNOSIS — I129 Hypertensive chronic kidney disease with stage 1 through stage 4 chronic kidney disease, or unspecified chronic kidney disease: Secondary | ICD-10-CM | POA: Diagnosis present

## 2022-04-22 DIAGNOSIS — E559 Vitamin D deficiency, unspecified: Secondary | ICD-10-CM | POA: Diagnosis not present

## 2022-04-22 DIAGNOSIS — I959 Hypotension, unspecified: Secondary | ICD-10-CM | POA: Diagnosis not present

## 2022-04-22 DIAGNOSIS — W19XXXA Unspecified fall, initial encounter: Secondary | ICD-10-CM

## 2022-04-22 DIAGNOSIS — M25519 Pain in unspecified shoulder: Secondary | ICD-10-CM | POA: Diagnosis not present

## 2022-04-22 DIAGNOSIS — D62 Acute posthemorrhagic anemia: Secondary | ICD-10-CM | POA: Diagnosis not present

## 2022-04-22 DIAGNOSIS — H401133 Primary open-angle glaucoma, bilateral, severe stage: Secondary | ICD-10-CM | POA: Diagnosis not present

## 2022-04-22 DIAGNOSIS — E785 Hyperlipidemia, unspecified: Secondary | ICD-10-CM | POA: Diagnosis not present

## 2022-04-22 DIAGNOSIS — D509 Iron deficiency anemia, unspecified: Secondary | ICD-10-CM | POA: Diagnosis not present

## 2022-04-22 DIAGNOSIS — Z96642 Presence of left artificial hip joint: Secondary | ICD-10-CM | POA: Diagnosis not present

## 2022-04-22 DIAGNOSIS — E1122 Type 2 diabetes mellitus with diabetic chronic kidney disease: Secondary | ICD-10-CM | POA: Diagnosis present

## 2022-04-22 DIAGNOSIS — W1830XA Fall on same level, unspecified, initial encounter: Secondary | ICD-10-CM | POA: Diagnosis present

## 2022-04-22 DIAGNOSIS — R52 Pain, unspecified: Secondary | ICD-10-CM | POA: Diagnosis not present

## 2022-04-22 DIAGNOSIS — S72002A Fracture of unspecified part of neck of left femur, initial encounter for closed fracture: Secondary | ICD-10-CM | POA: Diagnosis not present

## 2022-04-22 DIAGNOSIS — M47812 Spondylosis without myelopathy or radiculopathy, cervical region: Secondary | ICD-10-CM | POA: Diagnosis not present

## 2022-04-22 DIAGNOSIS — N1831 Chronic kidney disease, stage 3a: Secondary | ICD-10-CM | POA: Diagnosis present

## 2022-04-22 DIAGNOSIS — S7292XA Unspecified fracture of left femur, initial encounter for closed fracture: Principal | ICD-10-CM

## 2022-04-22 DIAGNOSIS — Z96653 Presence of artificial knee joint, bilateral: Secondary | ICD-10-CM | POA: Diagnosis not present

## 2022-04-22 DIAGNOSIS — Z7401 Bed confinement status: Secondary | ICD-10-CM | POA: Diagnosis not present

## 2022-04-22 DIAGNOSIS — R079 Chest pain, unspecified: Secondary | ICD-10-CM | POA: Diagnosis not present

## 2022-04-22 DIAGNOSIS — R0902 Hypoxemia: Secondary | ICD-10-CM | POA: Diagnosis not present

## 2022-04-22 DIAGNOSIS — Z960 Presence of urogenital implants: Secondary | ICD-10-CM | POA: Diagnosis not present

## 2022-04-22 DIAGNOSIS — S72022A Displaced fracture of epiphysis (separation) (upper) of left femur, initial encounter for closed fracture: Secondary | ICD-10-CM

## 2022-04-22 DIAGNOSIS — H353114 Nonexudative age-related macular degeneration, right eye, advanced atrophic with subfoveal involvement: Secondary | ICD-10-CM | POA: Diagnosis not present

## 2022-04-22 DIAGNOSIS — M25552 Pain in left hip: Secondary | ICD-10-CM | POA: Diagnosis not present

## 2022-04-22 DIAGNOSIS — Z7984 Long term (current) use of oral hypoglycemic drugs: Secondary | ICD-10-CM | POA: Diagnosis not present

## 2022-04-22 DIAGNOSIS — S72302A Unspecified fracture of shaft of left femur, initial encounter for closed fracture: Secondary | ICD-10-CM | POA: Diagnosis not present

## 2022-04-22 LAB — CBC WITH DIFFERENTIAL/PLATELET
Abs Immature Granulocytes: 0.12 10*3/uL — ABNORMAL HIGH (ref 0.00–0.07)
Basophils Absolute: 0 10*3/uL (ref 0.0–0.1)
Basophils Relative: 0 %
Eosinophils Absolute: 0 10*3/uL (ref 0.0–0.5)
Eosinophils Relative: 0 %
HCT: 30.8 % — ABNORMAL LOW (ref 36.0–46.0)
Hemoglobin: 10.2 g/dL — ABNORMAL LOW (ref 12.0–15.0)
Immature Granulocytes: 1 %
Lymphocytes Relative: 27 %
Lymphs Abs: 4.9 10*3/uL — ABNORMAL HIGH (ref 0.7–4.0)
MCH: 30.9 pg (ref 26.0–34.0)
MCHC: 33.1 g/dL (ref 30.0–36.0)
MCV: 93.3 fL (ref 80.0–100.0)
Monocytes Absolute: 1.5 10*3/uL — ABNORMAL HIGH (ref 0.1–1.0)
Monocytes Relative: 8 %
Neutro Abs: 11.7 10*3/uL — ABNORMAL HIGH (ref 1.7–7.7)
Neutrophils Relative %: 64 %
Platelets: 163 10*3/uL (ref 150–400)
RBC: 3.3 MIL/uL — ABNORMAL LOW (ref 3.87–5.11)
RDW: 13.8 % (ref 11.5–15.5)
WBC: 18.3 10*3/uL — ABNORMAL HIGH (ref 4.0–10.5)
nRBC: 0 % (ref 0.0–0.2)

## 2022-04-22 LAB — PROTIME-INR
INR: 1.1 (ref 0.8–1.2)
Prothrombin Time: 14.5 seconds (ref 11.4–15.2)

## 2022-04-22 LAB — BASIC METABOLIC PANEL
Anion gap: 6 (ref 5–15)
BUN: 24 mg/dL — ABNORMAL HIGH (ref 8–23)
CO2: 22 mmol/L (ref 22–32)
Calcium: 8.9 mg/dL (ref 8.9–10.3)
Chloride: 104 mmol/L (ref 98–111)
Creatinine, Ser: 1.08 mg/dL — ABNORMAL HIGH (ref 0.44–1.00)
GFR, Estimated: 48 mL/min — ABNORMAL LOW (ref >60)
Glucose, Bld: 183 mg/dL — ABNORMAL HIGH (ref 70–99)
Potassium: 4.5 mmol/L (ref 3.5–5.1)
Sodium: 132 mmol/L — ABNORMAL LOW (ref 135–145)

## 2022-04-22 LAB — VITAMIN D 25 HYDROXY (VIT D DEFICIENCY, FRACTURES): Vit D, 25-Hydroxy: 38.25 ng/mL (ref 30–100)

## 2022-04-22 MED ORDER — BUSPIRONE HCL 5 MG PO TABS
30.0000 mg | ORAL_TABLET | Freq: Every day | ORAL | Status: DC
  Administered 2022-04-22 – 2022-04-23 (×2): 30 mg via ORAL
  Filled 2022-04-22 (×4): qty 6

## 2022-04-22 MED ORDER — ASPIRIN 325 MG PO TBEC
325.0000 mg | DELAYED_RELEASE_TABLET | Freq: Every day | ORAL | Status: DC
  Administered 2022-04-22: 325 mg via ORAL
  Filled 2022-04-22 (×2): qty 1

## 2022-04-22 MED ORDER — METHOCARBAMOL 500 MG PO TABS: 500.0000 mg | ORAL_TABLET | Freq: Three times a day (TID) | ORAL | Status: DC | PRN

## 2022-04-22 MED ORDER — TRAZODONE HCL 50 MG PO TABS
50.0000 mg | ORAL_TABLET | Freq: Every day | ORAL | Status: DC
  Administered 2022-04-22 – 2022-04-23 (×2): 50 mg via ORAL
  Filled 2022-04-22 (×4): qty 1

## 2022-04-22 MED ORDER — VITAMIN B-12 1000 MCG PO TABS
500.0000 ug | ORAL_TABLET | Freq: Every day | ORAL | Status: DC
  Administered 2022-04-22 – 2022-04-26 (×4): 500 ug via ORAL
  Filled 2022-04-22 (×4): qty 1

## 2022-04-22 MED ORDER — ONDANSETRON HCL 4 MG/2ML IJ SOLN: 4.0000 mg | Freq: Four times a day (QID) | INTRAMUSCULAR | Status: DC | PRN

## 2022-04-22 MED ORDER — HYDROCODONE-ACETAMINOPHEN 5-325 MG PO TABS
1.0000 | ORAL_TABLET | Freq: Four times a day (QID) | ORAL | Status: DC | PRN
  Administered 2022-04-22 – 2022-04-24 (×4): 1 via ORAL
  Filled 2022-04-22: qty 2
  Filled 2022-04-22 (×3): qty 1

## 2022-04-22 MED ORDER — FENTANYL CITRATE PF 50 MCG/ML IJ SOSY
50.0000 ug | PREFILLED_SYRINGE | INTRAMUSCULAR | Status: AC | PRN
  Administered 2022-04-22 (×2): 50 ug via INTRAVENOUS
  Filled 2022-04-22 (×2): qty 1

## 2022-04-22 MED ORDER — EZETIMIBE 10 MG PO TABS
10.0000 mg | ORAL_TABLET | Freq: Every day | ORAL | Status: DC
  Administered 2022-04-22 – 2022-04-26 (×4): 10 mg via ORAL
  Filled 2022-04-22 (×4): qty 1

## 2022-04-22 MED ORDER — MORPHINE SULFATE (PF) 2 MG/ML IV SOLN
0.5000 mg | INTRAVENOUS | Status: DC | PRN
  Administered 2022-04-22 – 2022-04-23 (×4): 0.5 mg via INTRAVENOUS
  Filled 2022-04-22 (×4): qty 1

## 2022-04-22 MED ORDER — METHOCARBAMOL 1000 MG/10ML IJ SOLN: 500.0000 mg | Freq: Three times a day (TID) | INTRAVENOUS | Status: DC | PRN

## 2022-04-22 MED ORDER — CEFAZOLIN SODIUM-DEXTROSE 2-4 GM/100ML-% IV SOLN
2.0000 g | INTRAVENOUS | Status: AC
  Administered 2022-04-23: 2 g via INTRAVENOUS
  Filled 2022-04-22: qty 100

## 2022-04-22 MED ORDER — SODIUM CHLORIDE 0.9 % IV SOLN: INTRAVENOUS | Status: AC

## 2022-04-22 MED ORDER — POLYETHYLENE GLYCOL 3350 17 G PO PACK: 17.0000 g | PACK | Freq: Every day | ORAL | Status: DC | PRN

## 2022-04-22 MED ORDER — PILOCARPINE HCL 1 % OP SOLN
1.0000 [drp] | Freq: Two times a day (BID) | OPHTHALMIC | Status: DC
Start: 2022-04-22 — End: 2022-04-27
  Administered 2022-04-22 – 2022-04-25 (×6): 1 [drp] via OPHTHALMIC
  Filled 2022-04-22: qty 15

## 2022-04-22 MED ORDER — SODIUM CHLORIDE 0.9 % IV SOLN: INTRAVENOUS | Status: DC

## 2022-04-22 MED ORDER — LATANOPROST 0.005 % OP SOLN
1.0000 [drp] | Freq: Every day | OPHTHALMIC | Status: DC
  Administered 2022-04-22 – 2022-04-25 (×3): 1 [drp] via OPHTHALMIC
  Filled 2022-04-22: qty 2.5

## 2022-04-22 MED ORDER — METOPROLOL SUCCINATE ER 25 MG PO TB24
25.0000 mg | ORAL_TABLET | Freq: Every day | ORAL | Status: DC
  Administered 2022-04-22 – 2022-04-26 (×4): 25 mg via ORAL
  Filled 2022-04-22 (×4): qty 1

## 2022-04-22 MED ORDER — PRAVASTATIN SODIUM 40 MG PO TABS
80.0000 mg | ORAL_TABLET | Freq: Every day | ORAL | Status: DC
  Administered 2022-04-22 – 2022-04-23 (×2): 80 mg via ORAL
  Filled 2022-04-22 (×5): qty 2

## 2022-04-22 MED ORDER — VITAMIN D 25 MCG (1000 UNIT) PO TABS
1000.0000 [IU] | ORAL_TABLET | Freq: Every day | ORAL | Status: DC
  Administered 2022-04-22 – 2022-04-26 (×4): 1000 [IU] via ORAL
  Filled 2022-04-22 (×4): qty 1

## 2022-04-22 MED ORDER — ESCITALOPRAM OXALATE 10 MG PO TABS
20.0000 mg | ORAL_TABLET | Freq: Every day | ORAL | Status: DC
  Administered 2022-04-22 – 2022-04-26 (×4): 20 mg via ORAL
  Filled 2022-04-22 (×4): qty 2

## 2022-04-22 MED ORDER — TRANEXAMIC ACID-NACL 1000-0.7 MG/100ML-% IV SOLN
1000.0000 mg | INTRAVENOUS | Status: AC
  Administered 2022-04-23: 1000 mg via INTRAVENOUS
  Filled 2022-04-22: qty 100

## 2022-04-22 NOTE — Assessment & Plan Note (Signed)
-  Overall stable mood -Continue constant reorientation and supportive care -Continue the use of BuSpar, trazodone and Lexapro.

## 2022-04-22 NOTE — Assessment & Plan Note (Signed)
-  In the setting of diabetes -Renal function stable, well controlled and at baseline currently -Continue to maintain adequate hydration, minimize nephrotoxic agents, hypotension and the use of contrast. -Continue to follow-up renal function trend.

## 2022-04-22 NOTE — ED Notes (Signed)
MD at bedside assessing the pt, orders to be placed

## 2022-04-22 NOTE — Assessment & Plan Note (Signed)
-  Overall stable and well-controlled -Continue the use of metoprolol and follow vital signs.

## 2022-04-22 NOTE — ED Notes (Signed)
Pt removed from O2 upon return from CT, pt O2 sats 98% on RA

## 2022-04-22 NOTE — Assessment & Plan Note (Signed)
-  Continue treatment with Lumigan and Pilocarpine sol -Patient also with history of macular degeneration; continue outpatient follow-up with ophthalmology service.

## 2022-04-22 NOTE — Assessment & Plan Note (Signed)
-  No new focal deficits appreciated -Continue aspirin and statin; continue risk factor modification.

## 2022-04-22 NOTE — ED Notes (Signed)
Pt transported to CT scan, pt placed on 2 L [place] while off the unit d/t recent med admin, pt on monitor while being transported

## 2022-04-22 NOTE — Consult Note (Signed)
ORTHOPAEDIC CONSULTATION  REQUESTING PHYSICIAN: Barton Dubois, MD  ASSESSMENT AND PLAN: 88 y.o. female with the following: Left Hip Subtrochanteric femur fracture  This patient requires inpatient admission to the hospitalist, to include preoperative clearance and perioperative medical management  - Weight Bearing Status/Activity: NWB Left lower extremity  - Additional recommended labs/tests: Preop Labs: CBC, BMP, PT/INR, Chest XR, and EKG  -VTE Prophylaxis: Please hold prior to OR; to resume POD#1 at the discretion of the primary team  - Pain control: Recommend PO pain medications PRN; judicious use of narcotics  - Follow-up plan: F/u 10-14 days postop  -Procedures: Plan for OR once patient has been medically optimized  Plan for Left Hip Cephalomedullary nail   Plan was discussed via telephone with the patient's son.  We will plan for surgery 04/23/2022.  She is to be n.p.o. at midnight.  Consent will be completed prior to surgery.  All questions were answered, and the son is in agreement with this plan.  Chief Complaint: Left hip pain  HPI: Kristy [name] is a 88 y.o. female who presented to the ED for evaluation after sustaining a mechanical fall.  She has dementia, and does not answer questions appropriately.  This information was gleaned from the chart, and in discussions with the patient's family.  Patient is in a long-term care facility, and was found down.  It is believed that she sustained a mechanical fall.  She was complaining of pain in the left leg.  She was brought to the emergency department.  She is calling for help in her hospital bed.  No family is available  Past Medical History:  Diagnosis Date   Anxiety    Colon polyps    Complication of anesthesia 01/18/2013   slow to awaken and felt crazy for 3-4 days   Dementia ([place])    Diabetes mellitus    Diverticulitis    H/O hiatal hernia    Hearing loss 01/07/2013   bilateral hearing aids   History of shingles  01/07/2013   3'14 recent outbreak- drying in    [place] (hard of hearing) 02/05/2018   Hypercholesteremia    Hypertension    Dr. Laurance Flatten   Lumbar spondylosis    osteoarthritis-knees   Macular degeneration of both eyes    and dry eyes   Stroke ([place])    TIA   Past Surgical History:  Procedure Laterality Date   ABDOMINAL HYSTERECTOMY  1987   APPENDECTOMY     pt thinks this was removed when gall bladder was taken out   [place]   open   KNEE ARTHROSCOPY  01-07-13   left knee- many yrs ago   Lumbar back surgery  2013   TOTAL KNEE ARTHROPLASTY Left 01/18/2013   Procedure: TOTAL KNEE ARTHROPLASTY;  Surgeon: Gearlean Alf, MD;  Location: [facility];  Service: Orthopedics;  Laterality: Left;   TOTAL KNEE ARTHROPLASTY Right 03/07/2014   Procedure: RIGHT TOTAL KNEE ARTHROPLASTY;  Surgeon: Gearlean Alf, MD;  Location: [facility];  Service: Orthopedics;  Laterality: Right;   VENTRAL HERNIA REPAIR     Social History   Socioeconomic History   Marital status: Married    Spouse name: [name]    Number of children: 2   Years of education: Not on file   Highest education level: Not on file  Occupational History   Occupation: retired    Comment: banking   Tobacco Use   Smoking status: Never  Smokeless tobacco: Never  Vaping Use   Vaping Use: Never used  Substance and Sexual Activity   Alcohol use: No    Alcohol/week: 0.0 standard drinks of alcohol   Drug use: No   Sexual activity: Not Currently  Other Topics Concern   Not on file  Social History Narrative   Lives with husband   Social Determinants of Health   Financial Resource Strain: Not on file  Food Insecurity: Not on file  Transportation Needs: Not on file  Physical Activity: Not on file  Stress: Not on file  Social Connections: Not on file   Family History  Problem Relation Age of Onset   Stroke Father 4   Heart disease Father    Irregular heart beat Father        pacemaker    Healthy  Mother    Healthy Son    Heart disease Sister        unknown heart condition    Bipolar disorder Son    COPD Son    Cancer Sister        colon   Allergies  Allergen Reactions   Vioxx [Rofecoxib] Other (See Comments)    Ulcers in mouth    Prior to Admission medications   Medication Sig Start Date End Date Taking? Authorizing Provider  busPIRone (BUSPAR) 30 MG tablet Take 1 tablet (30 mg total) by mouth at bedtime. Patient taking differently: Take 30 mg by mouth 2 (two) times daily. 04/04/21  Yes Dettinger, Fransisca Kaufmann, MD  cholecalciferol (VITAMIN D) 1000 UNITS tablet Take 1,000 Units by mouth daily.   Yes [provider]  escitalopram (LEXAPRO) 20 MG tablet Take 20 mg by mouth daily.   Yes [provider]  ezetimibe (ZETIA) 10 MG tablet Take 1 tablet (10 mg total) by mouth daily. 04/04/21  Yes Dettinger, Fransisca Kaufmann, MD  glimepiride (AMARYL) 4 MG tablet TAKE 1 TABLET BY MOUTH DAILY WITH BREAKFAST Patient taking differently: Take 2 mg by mouth daily with breakfast. 10/11/21  Yes Dettinger, Fransisca Kaufmann, MD  guaiFENesin (ROBITUSSIN) 100 MG/5ML liquid Take 5 mLs by mouth every 4 (four) hours as needed for cough or to loosen phlegm.   Yes [provider]  LUMIGAN 0.01 % SOLN Place 1 drop into both eyes at bedtime. 11/09/15  Yes [provider]  melatonin 3 MG TABS tablet Take 3 mg by mouth at bedtime.   Yes [provider]  metoprolol succinate (TOPROL-XL) 25 MG 24 hr tablet Take 1 tablet (25 mg total) by mouth daily. 10/05/21  Yes Dettinger, Fransisca Kaufmann, MD  Multiple Vitamins-Minerals (PRESERVISION AREDS 2 PO) Take by mouth.   Yes [provider]  oxybutynin (DITROPAN) 5 MG tablet Take 5 mg by mouth 2 (two) times daily.   Yes [provider]  pilocarpine (PILOCAR) 1 % ophthalmic solution Place 1 drop into both eyes 2 (two) times daily.   Yes [provider]  pravastatin (PRAVACHOL) 80 MG tablet Take 1 tablet (80 mg total) by mouth at  bedtime. 10/05/21  Yes Dettinger, Fransisca Kaufmann, MD  traZODone (DESYREL) 50 MG tablet Take 50 mg by mouth at bedtime.   Yes [provider]  vitamin B-12 (CYANOCOBALAMIN) 500 MCG tablet Take 500 mcg by mouth daily.   Yes [provider]  glucose blood (FREESTYLE TEST STRIPS) test strip Check Blood sugar two times daily 08/24/18   Chipper Herb, MD  glucose blood (ONE TOUCH ULTRA TEST) test strip CHECK BLOOD SUGAR TWO  TIMES DAILY 08/25/18   Chipper Herb, MD  Lancets [facility] ULTRASOFT) lancets Check blood sugars twice a day 09/11/18   Chipper Herb, MD  lisinopril (ZESTRIL) 10 MG tablet TAKE 1/2 TABLET BY MOUTH DAILY Patient not taking: Reported on 04/22/2022 04/04/22   Dettinger, Fransisca Kaufmann, MD  metFORMIN (GLUCOPHAGE-XR) 500 MG 24 hr tablet TAKE 2 TABLETS BY MOUTH EVERY DAY WITH BREAKFAST Patient not taking: Reported on 04/22/2022 10/05/21   Dettinger, Fransisca Kaufmann, MD   CT CERVICAL SPINE WO CONTRAST  Result Date: 04/22/2022 CLINICAL DATA:  88 year old female status post fall EXAM: CT CERVICAL SPINE WITHOUT CONTRAST TECHNIQUE: Multidetector CT imaging of the cervical spine was performed without intravenous contrast. Multiplanar CT image reconstructions were also generated. RADIATION DOSE REDUCTION: This exam was performed according to the departmental dose-optimization program which includes automated exposure control, adjustment of the mA and/or kV according to patient size and/or use of iterative reconstruction technique. COMPARISON:  CT 03/19/2022 FINDINGS: Motion artifact obscures bony detail. Alignment: Rightward rotation of C1 around the dens has increased since 03/19/2022. This is favored positional although given the degree of rotation, rotary subluxation can not be excluded. Additional alignment is unchanged. Slight anterolisthesis C2 and C3. Skull base and vertebrae: No acute fracture. No primary bone lesion or focal pathologic process. Soft tissues and spinal canal: No prevertebral  fluid or swelling. No visible canal hematoma. Disc levels: Multilevel disc space height loss greatest at C5-C6 and C6-C7 where it is advanced. Multilevel spondylosis with anterior and posterior osteophytes greatest at C5-C7. Advanced cervical facet arthropathy. The posterior disc osteophyte complexes at C3-C4, C4-C5, C5-C6, and C6-C7 cause encroachment upon the spinal canal. Multilevel neural foraminal narrowing secondary to uncovertebral hypertrophy and facet arthropathy. Greatest at C5-C7 where it is advanced. Upper chest: Negative. Other: None. IMPRESSION: 1. Increased rotation of C1 about the dens compared with 03/19/2022. This is favored positional though rotary subluxation can not be excluded. This finding was called by telephone at the time of interpretation on 04/22/2022 at 11:26 am to provider [facility] , who verbally acknowledged these results. 2. Chronic cervical spondylosis is not significantly changed from 03/19/2022. Electronically Signed   By: Placido Sou M.D.   On: 04/22/2022 11:27   CT HEAD WO CONTRAST  Result Date: 04/22/2022 CLINICAL DATA:  Polytrauma, blunt EXAM: CT HEAD WITHOUT CONTRAST TECHNIQUE: Contiguous axial images were obtained from the base of the skull through the vertex without intravenous contrast. RADIATION DOSE REDUCTION: This exam was performed according to the departmental dose-optimization program which includes automated exposure control, adjustment of the mA and/or kV according to patient size and/or use of iterative reconstruction technique. COMPARISON:  CT head June twenty, 2023. FINDINGS: Brain: Motion limited study. Similar prior left PCA territory infarct with encephalomalacia in the left occipital lobe. Similar chronic microvascular disease and atrophy. No definite acute large vascular territory infarct. No evidence of acute hemorrhage, mass lesion, midline shift, or hydrocephalus. Vascular: No obvious hyperdense vessel identified. Calcific intracranial  atherosclerosis. Skull: No acute fracture. Sinuses/Orbits: Clear sinuses.  No acute orbital findings. Other: No mastoid effusions. IMPRESSION: 1. Motion limited study without evidence of acute intracranial abnormality. 2. Similar prior left PCA territory infarct. An MRI could provide more sensitive evaluation for acute infarct if clinically warranted. Electronically Signed   By: Margaretha Sheffield M.D.   On: 04/22/2022 10:49   DG FEMUR MIN 2 VIEWS LEFT  Result Date: 04/22/2022 CLINICAL DATA:  Fall with left hip pain and deformity EXAM: LEFT FEMUR 2 VIEWS;  PELVIS - 1-2 VIEW COMPARISON:  Pelvis radiographs 02/07/2015 FINDINGS: Pelvis: There is an acute displaced fracture of the proximal left femur with significant medial and posterior angulation of the distal fragment. Femoroacetabular alignment is maintained. Right hip alignment is maintained. There is no right hip fracture. The SI joints and symphysis pubis are intact. Lower lumbar spine fusion hardware is noted. Femur: No additional femur fracture is seen. Postsurgical changes reflecting right knee arthroplasty are seen without evidence of complication. IMPRESSION: Acute displaced and posteromedially angulated proximal left femur fracture. Electronically Signed   By: Valetta Mole M.D.   On: 04/22/2022 10:18   DG Pelvis 1-2 Views  Result Date: 04/22/2022 CLINICAL DATA:  Fall with left hip pain and deformity EXAM: LEFT FEMUR 2 VIEWS; PELVIS - 1-2 VIEW COMPARISON:  Pelvis radiographs 02/07/2015 FINDINGS: Pelvis: There is an acute displaced fracture of the proximal left femur with significant medial and posterior angulation of the distal fragment. Femoroacetabular alignment is maintained. Right hip alignment is maintained. There is no right hip fracture. The SI joints and symphysis pubis are intact. Lower lumbar spine fusion hardware is noted. Femur: No additional femur fracture is seen. Postsurgical changes reflecting right knee arthroplasty are seen without  evidence of complication. IMPRESSION: Acute displaced and posteromedially angulated proximal left femur fracture. Electronically Signed   By: Valetta Mole M.D.   On: 04/22/2022 10:18   DG Chest 1 View  Result Date: 04/22/2022 CLINICAL DATA:  Fall with left hip pain and deformity. EXAM: CHEST  1 VIEW COMPARISON:  Chest radiograph 03/03/2019 FINDINGS: Cardiomediastinal silhouette is within normal limits. There is no focal consolidation or pulmonary edema. There is no pleural effusion or pneumothorax There is no acute osseous abnormality. No displaced rib fracture is seen. IMPRESSION: No radiographic evidence of acute cardiopulmonary process. Electronically Signed   By: Valetta Mole M.D.   On: 04/22/2022 10:14    Family History Reviewed and non-contributory, no pertinent history of problems with bleeding or anesthesia    Review of Systems Unable to assess due to patient's dementia.    OBJECTIVE  Vitals:Patient Vitals for the past 8 hrs:  BP Temp Temp src Pulse Resp SpO2 Height Weight  04/22/22 1700 (!) 144/88 97.9 F (36.6 C) Oral 75 18 96 % -- --  04/22/22 1307 (!) 155/90 97.6 F (36.4 C) -- 73 18 -- '5\' 5"'  (1.651 m) 74.3 kg  04/22/22 1302 (!) 155/90 -- -- 73 18 96 % -- --  04/22/22 1258 -- -- -- -- -- -- -- 74.3 kg  04/22/22 1230 (!) 149/69 -- -- -- (!) 22 -- -- --  04/22/22 1215 -- -- -- 65 (!) 21 95 % -- --  04/22/22 1200 (!) 145/62 -- -- 70 15 93 % -- --   General: Calling for help, but no additional signs of distress Cardiovascular: Extremities are warm Respiratory: No cyanosis, no use of accessory musculature Skin: No lesions in the area of chief complaint  Neurologic: Responds to light touch distally Psychiatric: Patient does not answer questions appropriately Lymphatic: No swelling obvious and reported other than the area involved in the exam below Extremities  LLE: Extremity held in a fixed position.  Obvious deformity left thigh.  ROM deferred due to known fracture.  She  responds to light touch distally.  2+ DP pulse.  Toes are WWP.  Active motion intact in the TA/EHL/GS. RLE: No deformity.  She responds to light touch distally.  2+ DP pulse.  Toes are WWP.  Active motion  intact in the TA/EHL/GS. Tolerates gentle ROM of the hip.  No pain with axial loading.     Test Results Imaging XR of the Left hip demonstrates a displaced Subtrochanteric femur fracture.  Labs cbc Recent Labs    04/22/22 0953  WBC 18.3*  HGB 10.2*  HCT 30.8*  PLT 163    Labs inflam No results for input(s): "CRP" in the last 72 hours.  Invalid input(s): "ESR"  Labs coag Recent Labs    04/22/22 0953  INR 1.1    Recent Labs    04/22/22 0953  NA 132*  K 4.5  CL 104  CO2 22  GLUCOSE 183*  BUN 24*  CREATININE 1.08*  CALCIUM 8.9

## 2022-04-22 NOTE — H&P (Signed)
History and Physical    Patient: Kristy [name] DOB: 12/27/1927 DOA: 04/22/2022 DOS: the patient was seen and examined on 04/22/2022 PCP: System, Provider Not In  Patient coming from: SNF  Chief Complaint:  Chief Complaint  Patient presents with   Fall   HPI: Kristy [name] is a 88 y.o. female with medical history significant of dementia with behavioral disorder, hypertension, hyperlipidemia, prior history of a stroke/TIA, type 2 diabetes mellitus and macular degeneration/increase intraocular pressure; who presented from the skilled nursing facility after unwitnessed mechanical fall with complains of left leg pain and inability to bear weight after that.  No fever, no chest pain, no nausea, no vomiting, no sick contacts or complaints of dysuria/hematuria, melena or hematochezia; but information may be inaccurate given patient underlying dementia and lack of credibility with info  In the ED work-up demonstrating displays fracture of left femur with physical signs of tenderness and deformity.  Case discussed with orthopedic surgery who felt patient will need surgical repair and the plan is for intervention on 04/23/2022. [facility] consulted to help with admission, evaluation and medical management.  Review of Systems: As mentioned in the history of present illness. All other systems reviewed and are negative. Past Medical History:  Diagnosis Date   Anxiety    Colon polyps    Complication of anesthesia 01/18/2013   slow to awaken and felt crazy for 3-4 days   Dementia ([place])    Diabetes mellitus    Diverticulitis    H/O hiatal hernia    Hearing loss 01/07/2013   bilateral hearing aids   History of shingles 01/07/2013   3'14 recent outbreak- drying in    [place] (hard of hearing) 02/05/2018   Hypercholesteremia    Hypertension    Dr. Laurance Flatten   Lumbar spondylosis    osteoarthritis-knees   Macular degeneration of both eyes    and dry eyes   Stroke [facility])    TIA   Past Surgical  History:  Procedure Laterality Date   ABDOMINAL HYSTERECTOMY  1987   APPENDECTOMY     pt thinks this was removed when gall bladder was taken out   [place]   open   KNEE ARTHROSCOPY  01-07-13   left knee- many yrs ago   Lumbar back surgery  2013   TOTAL KNEE ARTHROPLASTY Left 01/18/2013   Procedure: TOTAL KNEE ARTHROPLASTY;  Surgeon: Gearlean Alf, MD;  Location: [facility];  Service: Orthopedics;  Laterality: Left;   TOTAL KNEE ARTHROPLASTY Right 03/07/2014   Procedure: RIGHT TOTAL KNEE ARTHROPLASTY;  Surgeon: Gearlean Alf, MD;  Location: [facility];  Service: Orthopedics;  Laterality: Right;   VENTRAL HERNIA REPAIR     Social History:  reports that she has never smoked. She has never used smokeless tobacco. She reports that she does not drink alcohol and does not use drugs.  Allergies  Allergen Reactions   Vioxx [Rofecoxib] Other (See Comments)    Ulcers in mouth     Family History  Problem Relation Age of Onset   Stroke Father 49   Heart disease Father    Irregular heart beat Father        pacemaker    Healthy Mother    Healthy Son    Heart disease Sister        unknown heart condition    Bipolar disorder Son    COPD Son    Cancer Sister  colon    Prior to Admission medications   Medication Sig Start Date End Date Taking? Authorizing Provider  busPIRone (BUSPAR) 30 MG tablet Take 1 tablet (30 mg total) by mouth at bedtime. Patient taking differently: Take 30 mg by mouth 2 (two) times daily. 04/04/21  Yes Dettinger, Fransisca Kaufmann, MD  cholecalciferol (VITAMIN D) 1000 UNITS tablet Take 1,000 Units by mouth daily.   Yes [provider]  escitalopram (LEXAPRO) 20 MG tablet Take 20 mg by mouth daily.   Yes [provider]  ezetimibe (ZETIA) 10 MG tablet Take 1 tablet (10 mg total) by mouth daily. 04/04/21  Yes Dettinger, Fransisca Kaufmann, MD  glimepiride (AMARYL) 4 MG tablet TAKE 1 TABLET BY MOUTH DAILY WITH BREAKFAST Patient taking  differently: Take 2 mg by mouth daily with breakfast. 10/11/21  Yes Dettinger, Fransisca Kaufmann, MD  guaiFENesin (ROBITUSSIN) 100 MG/5ML liquid Take 5 mLs by mouth every 4 (four) hours as needed for cough or to loosen phlegm.   Yes [provider]  LUMIGAN 0.01 % SOLN Place 1 drop into both eyes at bedtime. 11/09/15  Yes [provider]  melatonin 3 MG TABS tablet Take 3 mg by mouth at bedtime.   Yes [provider]  metoprolol succinate (TOPROL-XL) 25 MG 24 hr tablet Take 1 tablet (25 mg total) by mouth daily. 10/05/21  Yes Dettinger, Fransisca Kaufmann, MD  Multiple Vitamins-Minerals (PRESERVISION AREDS 2 PO) Take by mouth.   Yes [provider]  oxybutynin (DITROPAN) 5 MG tablet Take 5 mg by mouth 2 (two) times daily.   Yes [provider]  pilocarpine (PILOCAR) 1 % ophthalmic solution Place 1 drop into both eyes 2 (two) times daily.   Yes [provider]  pravastatin (PRAVACHOL) 80 MG tablet Take 1 tablet (80 mg total) by mouth at bedtime. 10/05/21  Yes Dettinger, Fransisca Kaufmann, MD  traZODone (DESYREL) 50 MG tablet Take 50 mg by mouth at bedtime.   Yes [provider]  vitamin B-12 (CYANOCOBALAMIN) 500 MCG tablet Take 500 mcg by mouth daily.   Yes [provider]  glucose blood (FREESTYLE TEST STRIPS) test strip Check Blood sugar two times daily 08/24/18   Chipper Herb, MD  glucose blood (ONE TOUCH ULTRA TEST) test strip CHECK BLOOD SUGAR TWO TIMES DAILY 08/25/18   Chipper Herb, MD  Lancets [facility] ULTRASOFT) lancets Check blood sugars twice a day 09/11/18   Chipper Herb, MD  lisinopril (ZESTRIL) 10 MG tablet TAKE 1/2 TABLET BY MOUTH DAILY Patient not taking: Reported on 04/22/2022 04/04/22   Dettinger, Fransisca Kaufmann, MD  metFORMIN (GLUCOPHAGE-XR) 500 MG 24 hr tablet TAKE 2 TABLETS BY MOUTH EVERY DAY WITH BREAKFAST Patient not taking: Reported on 04/22/2022 10/05/21   Dettinger, Fransisca Kaufmann, MD    Physical Exam: Vitals:   04/22/22 1258 04/22/22 1302  04/22/22 1307 04/22/22 1700  BP:  (!) 155/90 (!) 155/90 (!) 144/88  Pulse:  73 73 75  Resp:  '18 18 18  '$ Temp:   97.6 F (36.4 C) 97.9 F (36.6 C)  TempSrc:    Oral  SpO2:  96%  96%  Weight: 74.3 kg  74.3 kg   Height:   '5\' 5"'$  (1.651 m)    General exam: Alert, awake, able to follow simple commands; pleasantly confused and disoriented.  No chest pain, no nausea, no vomiting, no headaches.  Reports pain in her left leg. Respiratory system: Clear to auscultation. Respiratory effort normal.  Good saturation on room air.  Cardiovascular system: Rate controlled, no rubs, no gallops, no JVD. Gastrointestinal system: Abdomen is nondistended, soft and nontender. No organomegaly or masses felt. Normal bowel sounds heard. Central nervous system: No focal neurologic deficits; left lower extremity with limited mobility secondary to pain. Extremities: No cyanosis or clubbing; left leg deformed (internally rotated), swollen and tender to palpation. Skin: No petechiae. Psychiatry: Judgement and insight appear impaired secondary to dementia.  Mood is stable.  Data Reviewed: Basic metabolic panel: Sodium 269, potassium 4.5, chloride 104, bicarb 22, glucose 183, BUN 24, creatinine 1.08 and GFR 48 CBC: WBCs 18.3, hemoglobin 10.2, platelet count 163 K PT 14.5; INR 1.1 Vitamin D 38.25   Assessment and Plan: * Closed fracture proximal femur, transepiphyseal, left, initial encounter ([place]) - In the setting of mechanical fall -Stable vitamin D level appreciated -Case discussed with orthopedic service with plans for surgical intervention on 04/23/2022 -Continue as needed analgesics/muscle relaxant -Gentle fluid resuscitation overnight and follow clinical response. -PT/OT after surgical intervention following hip/femur fracture protocol. -SCDs and aspirin for DVT prophylaxis.  Increased intraocular pressure - Continue treatment with Lumigan and Pilocarpine sol -Patient also with history of macular  degeneration; continue outpatient follow-up with ophthalmology service.  H/O TIA (transient ischemic attack) and stroke - No new focal deficits appreciated -Continue aspirin and statin; continue risk factor modification.  Chronic kidney disease, stage 3a ([place]) - In the setting of diabetes -Stable, well controlled and at baseline currently -Continue to maintain adequate hydration, minimize nephrotoxic agents, hypotension and the use of contrast. -Follow-up renal function trend.  Dyslipidemia - Continue heart healthy diet, Pravachol and the use of zetia  Dementia with behavioral disturbance (HCC) - Overall stable mood -Continue conservative mentation and supportive care -Continue the use of BuSpar, trazodone and Lexapro.  Type 2 diabetes with nephropathy (HCC) - Holding oral hypoglycemic agents while inpatient -Started on sliding scale insulin -Follow CBGs and adjust hypoglycemic regimen as needed -Patient with a stage III chronic renal failure (stage 3a at baseline) which appears to be stable. -Minimize nephrotoxic agents, maintain adequate hydration and follow renal function trend.  Essential hypertension - Overall stable and well-controlled -Continue the use of metoprolol and follow vital signs.      Advance Care Planning:   Code Status: DNR   Consults: Orthopedic service  Family Communication: No family at bedside  Severity of Illness: The appropriate patient status for this patient is INPATIENT. Inpatient status is judged to be reasonable and necessary in order to provide the required intensity of service to ensure the patient's safety. The patient's presenting symptoms, physical exam findings, and initial radiographic and laboratory data in the context of their chronic comorbidities is felt to place them at high risk for further clinical deterioration. Furthermore, it is not anticipated that the patient will be medically stable for discharge from the hospital within 2  midnights of admission.   * I certify that at the point of admission it is my clinical judgment that the patient will require inpatient hospital care spanning beyond 2 midnights from the point of admission due to high intensity of service, high risk for further deterioration and high frequency of surveillance required.*  Author: Barton Dubois, MD 04/22/2022 6:39 PM  For on call review [email or web address].

## 2022-04-22 NOTE — ED Provider Notes (Signed)
[facility] EMERGENCY DEPARTMENT Provider Note   CSN: [id number] Arrival date & time: 04/22/22  5643     History  Chief Complaint  Patient presents with   Kristy [name]    Kristy [name] is a 88 y.o. female.  HPI Patient presenting with injury to left proximal upper leg, when fell at her nursing care facility.  She was apparently in the hallway and it was not witnessed by staff.  Peers indicated that she fell spontaneously.  She is unable to give history.    Home Medications Prior to Admission medications   Medication Sig Start Date End Date Taking? Authorizing Provider  busPIRone (BUSPAR) 30 MG tablet Take 1 tablet (30 mg total) by mouth at bedtime. Patient taking differently: Take 30 mg by mouth 2 (two) times daily. 04/04/21  Yes Dettinger, Fransisca Kaufmann, MD  cholecalciferol (VITAMIN D) 1000 UNITS tablet Take 1,000 Units by mouth daily.   Yes [provider]  escitalopram (LEXAPRO) 20 MG tablet Take 20 mg by mouth daily.   Yes [provider]  ezetimibe (ZETIA) 10 MG tablet Take 1 tablet (10 mg total) by mouth daily. 04/04/21  Yes Dettinger, Fransisca Kaufmann, MD  glimepiride (AMARYL) 4 MG tablet TAKE 1 TABLET BY MOUTH DAILY WITH BREAKFAST Patient taking differently: Take 2 mg by mouth daily with breakfast. 10/11/21  Yes Dettinger, Fransisca Kaufmann, MD  guaiFENesin (ROBITUSSIN) 100 MG/5ML liquid Take 5 mLs by mouth every 4 (four) hours as needed for cough or to loosen phlegm.   Yes [provider]  LUMIGAN 0.01 % SOLN Place 1 drop into both eyes at bedtime. 11/09/15  Yes [provider]  melatonin 3 MG TABS tablet Take 3 mg by mouth at bedtime.   Yes [provider]  metoprolol succinate (TOPROL-XL) 25 MG 24 hr tablet Take 1 tablet (25 mg total) by mouth daily. 10/05/21  Yes Dettinger, Fransisca Kaufmann, MD  Multiple Vitamins-Minerals (PRESERVISION AREDS 2 PO) Take by mouth.   Yes [provider]  oxybutynin (DITROPAN) 5 MG tablet Take 5 mg by mouth 2 (two) times daily.    Yes [provider]  pilocarpine (PILOCAR) 1 % ophthalmic solution Place 1 drop into both eyes 2 (two) times daily.   Yes [provider]  pravastatin (PRAVACHOL) 80 MG tablet Take 1 tablet (80 mg total) by mouth at bedtime. 10/05/21  Yes Dettinger, Fransisca Kaufmann, MD  traZODone (DESYREL) 50 MG tablet Take 50 mg by mouth at bedtime.   Yes [provider]  vitamin B-12 (CYANOCOBALAMIN) 500 MCG tablet Take 500 mcg by mouth daily.   Yes [provider]  glucose blood (FREESTYLE TEST STRIPS) test strip Check Blood sugar two times daily 08/24/18   Chipper Herb, MD  glucose blood (ONE TOUCH ULTRA TEST) test strip CHECK BLOOD SUGAR TWO TIMES DAILY 08/25/18   Chipper Herb, MD  Lancets [facility] ULTRASOFT) lancets Check blood sugars twice a day 09/11/18   Chipper Herb, MD  lisinopril (ZESTRIL) 10 MG tablet TAKE 1/2 TABLET BY MOUTH DAILY Patient not taking: Reported on 04/22/2022 04/04/22   Dettinger, Fransisca Kaufmann, MD  metFORMIN (GLUCOPHAGE-XR) 500 MG 24 hr tablet TAKE 2 TABLETS BY MOUTH EVERY DAY WITH BREAKFAST Patient not taking: Reported on 04/22/2022 10/05/21   Dettinger, Fransisca Kaufmann, MD      Allergies    Vioxx [rofecoxib]    Review of Systems   Review of Systems  Physical Exam Updated Vital Signs BP (!) 150/65   Pulse 69  Temp 97.6 F (36.4 C) (Oral)   Resp 16   SpO2 92%  Physical Exam Vitals and nursing note reviewed.  Constitutional:      General: She is in acute distress (Uncomfortable).     Appearance: She is well-developed. She is not ill-appearing, toxic-appearing or diaphoretic.  HENT:     Head: Normocephalic and atraumatic.     Comments: No visible injury to head or neck    Right Ear: External ear normal.     Left Ear: External ear normal.     Nose: No congestion.     Mouth/Throat:     Pharynx: No oropharyngeal exudate.  Eyes:     Conjunctiva/sclera: Conjunctivae normal.     Pupils: Pupils are equal, round, and reactive to light.  Neck:      Trachea: Phonation normal.     Comments: Cervical collar in place Cardiovascular:     Rate and Rhythm: Normal rate and regular rhythm.     Heart sounds: Normal heart sounds.  Pulmonary:     Effort: Pulmonary effort is normal. No respiratory distress.     Breath sounds: Normal breath sounds. No stridor.  Chest:     Chest wall: No tenderness.  Abdominal:     General: There is no distension.     Palpations: Abdomen is soft. There is no mass.     Tenderness: There is no abdominal tenderness.  Musculoskeletal:        General: Swelling, tenderness, deformity and signs of injury present.     Comments: Normal grip strength bilateral hands.  Deformity, proximal left femur with large bruise, and tenderness of the proximal femur region.  Left hip is internally rotated.  Neurovascular tact distally in the left foot.  No injury to arms or right leg.  Skin:    General: Skin is warm and dry.  Neurological:     Mental Status: She is alert. She is disoriented.     Cranial Nerves: No cranial nerve deficit.     Sensory: No sensory deficit.     Motor: No abnormal muscle tone.     Coordination: Coordination normal.     Comments: Cooperative, follows commands, no dysarthria  Psychiatric:        Mood and Affect: Mood normal.        Behavior: Behavior normal.     ED Results / Procedures / Treatments   Labs (all labs ordered are listed, but only abnormal results are displayed) Labs Reviewed  BASIC METABOLIC PANEL - Abnormal; Notable for the following components:      Result Value   Sodium 132 (*)    Glucose, Bld 183 (*)    BUN 24 (*)    Creatinine, Ser 1.08 (*)    GFR, Estimated 48 (*)    All other components within normal limits  CBC WITH DIFFERENTIAL/PLATELET - Abnormal; Notable for the following components:   WBC 18.3 (*)    RBC 3.30 (*)    Hemoglobin 10.2 (*)    HCT 30.8 (*)    Neutro Abs 11.7 (*)    Lymphs Abs 4.9 (*)    Monocytes Absolute 1.5 (*)    Abs Immature Granulocytes 0.12 (*)     All other components within normal limits  PROTIME-INR  TYPE AND SCREEN    EKG EKG Interpretation  Date/Time:  Monday April 22 2022 09:32:54 EDT Ventricular Rate:  70 PR Interval:  163 QRS Duration: 99 QT Interval:  400 QTC Calculation: 432 R Axis:   -  6 Text Interpretation: Sinus rhythm since last tracing no significant change Confirmed by Daleen Bo [phone]) on 04/22/2022 11:00:21 AM  Radiology CT CERVICAL SPINE WO CONTRAST  Result Date: 04/22/2022 CLINICAL DATA:  88 year old female status post fall EXAM: CT CERVICAL SPINE WITHOUT CONTRAST TECHNIQUE: Multidetector CT imaging of the cervical spine was performed without intravenous contrast. Multiplanar CT image reconstructions were also generated. RADIATION DOSE REDUCTION: This exam was performed according to the departmental dose-optimization program which includes automated exposure control, adjustment of the mA and/or kV according to patient size and/or use of iterative reconstruction technique. COMPARISON:  CT 03/19/2022 FINDINGS: Motion artifact obscures bony detail. Alignment: Rightward rotation of C1 around the dens has increased since 03/19/2022. This is favored positional although given the degree of rotation, rotary subluxation can not be excluded. Additional alignment is unchanged. Slight anterolisthesis C2 and C3. Skull base and vertebrae: No acute fracture. No primary bone lesion or focal pathologic process. Soft tissues and spinal canal: No prevertebral fluid or swelling. No visible canal hematoma. Disc levels: Multilevel disc space height loss greatest at C5-C6 and C6-C7 where it is advanced. Multilevel spondylosis with anterior and posterior osteophytes greatest at C5-C7. Advanced cervical facet arthropathy. The posterior disc osteophyte complexes at C3-C4, C4-C5, C5-C6, and C6-C7 cause encroachment upon the spinal canal. Multilevel neural foraminal narrowing secondary to uncovertebral hypertrophy and facet arthropathy.  Greatest at C5-C7 where it is advanced. Upper chest: Negative. Other: None. IMPRESSION: 1. Increased rotation of C1 about the dens compared with 03/19/2022. This is favored positional though rotary subluxation can not be excluded. This finding was called by telephone at the time of interpretation on 04/22/2022 at 11:26 am to provider [facility] , who verbally acknowledged these results. 2. Chronic cervical spondylosis is not significantly changed from 03/19/2022. Electronically Signed   By: Placido Sou M.D.   On: 04/22/2022 11:27   CT HEAD WO CONTRAST  Result Date: 04/22/2022 CLINICAL DATA:  Polytrauma, blunt EXAM: CT HEAD WITHOUT CONTRAST TECHNIQUE: Contiguous axial images were obtained from the base of the skull through the vertex without intravenous contrast. RADIATION DOSE REDUCTION: This exam was performed according to the departmental dose-optimization program which includes automated exposure control, adjustment of the mA and/or kV according to patient size and/or use of iterative reconstruction technique. COMPARISON:  CT head June twenty, 2023. FINDINGS: Brain: Motion limited study. Similar prior left PCA territory infarct with encephalomalacia in the left occipital lobe. Similar chronic microvascular disease and atrophy. No definite acute large vascular territory infarct. No evidence of acute hemorrhage, mass lesion, midline shift, or hydrocephalus. Vascular: No obvious hyperdense vessel identified. Calcific intracranial atherosclerosis. Skull: No acute fracture. Sinuses/Orbits: Clear sinuses.  No acute orbital findings. Other: No mastoid effusions. IMPRESSION: 1. Motion limited study without evidence of acute intracranial abnormality. 2. Similar prior left PCA territory infarct. An MRI could provide more sensitive evaluation for acute infarct if clinically warranted. Electronically Signed   By: Margaretha Sheffield M.D.   On: 04/22/2022 10:49   DG FEMUR MIN 2 VIEWS LEFT  Result Date:  04/22/2022 CLINICAL DATA:  Fall with left hip pain and deformity EXAM: LEFT FEMUR 2 VIEWS; PELVIS - 1-2 VIEW COMPARISON:  Pelvis radiographs 02/07/2015 FINDINGS: Pelvis: There is an acute displaced fracture of the proximal left femur with significant medial and posterior angulation of the distal fragment. Femoroacetabular alignment is maintained. Right hip alignment is maintained. There is no right hip fracture. The SI joints and symphysis pubis are intact. Lower lumbar spine fusion hardware is noted.  Femur: No additional femur fracture is seen. Postsurgical changes reflecting right knee arthroplasty are seen without evidence of complication. IMPRESSION: Acute displaced and posteromedially angulated proximal left femur fracture. Electronically Signed   By: Valetta Mole M.D.   On: 04/22/2022 10:18   DG Pelvis 1-2 Views  Result Date: 04/22/2022 CLINICAL DATA:  Fall with left hip pain and deformity EXAM: LEFT FEMUR 2 VIEWS; PELVIS - 1-2 VIEW COMPARISON:  Pelvis radiographs 02/07/2015 FINDINGS: Pelvis: There is an acute displaced fracture of the proximal left femur with significant medial and posterior angulation of the distal fragment. Femoroacetabular alignment is maintained. Right hip alignment is maintained. There is no right hip fracture. The SI joints and symphysis pubis are intact. Lower lumbar spine fusion hardware is noted. Femur: No additional femur fracture is seen. Postsurgical changes reflecting right knee arthroplasty are seen without evidence of complication. IMPRESSION: Acute displaced and posteromedially angulated proximal left femur fracture. Electronically Signed   By: Valetta Mole M.D.   On: 04/22/2022 10:18   DG Chest 1 View  Result Date: 04/22/2022 CLINICAL DATA:  Fall with left hip pain and deformity. EXAM: CHEST  1 VIEW COMPARISON:  Chest radiograph 03/03/2019 FINDINGS: Cardiomediastinal silhouette is within normal limits. There is no focal consolidation or pulmonary edema. There is no  pleural effusion or pneumothorax There is no acute osseous abnormality. No displaced rib fracture is seen. IMPRESSION: No radiographic evidence of acute cardiopulmonary process. Electronically Signed   By: Valetta Mole M.D.   On: 04/22/2022 10:14    Procedures Procedures    Medications Ordered in ED Medications  0.9 %  sodium chloride infusion ( Intravenous New Bag/Given 04/22/22 1039)  ceFAZolin (ANCEF) IVPB 2g/100 mL premix (has no administration in time range)  tranexamic acid (CYKLOKAPRON) IVPB 1,000 mg (has no administration in time range)  fentaNYL (SUBLIMAZE) injection 50 mcg (50 mcg Intravenous Given 04/22/22 1145)    ED Course/ Medical Decision Making/ A&P                           Medical Decision Making Fall, cause not clear.  Found down by staff.  She is a resident at a local nursing care facility and has dementia.  On arrival she appears to have a proximal femur fracture clinically.  Also concern for hip fracture.  Possible head injury, patient is unable to give history.  Amount and/or Complexity of Data Reviewed External Data Reviewed:     Details: External medical control with EMS, I authorized treatment with fentanyl in transport to this facility. Labs: ordered.    Details: CBC, metabolic panel, blood type -- normal except sodium low, glucose high, BUN high, creatinine high, GFR low, white count high, hemoglobin low Radiology: ordered and independent interpretation performed.    Details: CT head, CT cervical spine, x-ray pelvis, x-ray left hip, x-ray left femur -plain images normal except proximal femur fracture with angulation.  CT cervical spine indicates marked degenerative changes mid cervical spine.  There is also rotation of C1 around C2, without evidence for fracture. ECG/medicine tests: ordered and independent interpretation performed.    Details: Cardiac monitor ---normal sinus rhythm Discussion of management or test interpretation with external provider(s): Case  discussed with orthopedics, Dr.Cairns, he will see patient for operative fixation.  Case discussed with radiologist, regarding abnormal cervical spine CT.  Patient does not have clinical evidence for unstable cervical spine injury.  Case discussed with hospitalist to arrange for admission.  Risk Prescription  drug management. Decision regarding hospitalization. Elective major surgery with identified risk factors. Risk Details: Patient with likely mechanical fall, walking without walker and fractured her proximal left femur.  No other serious injury.  CT scan cervical spine abnormal but primarily indicative for arthritis.  Doubt significant ligamentous injury or fracture.  No intracranial injury.  Screening metabolic and CBC studies are reassuring.  Patient will likely benefit from surgery, to control pain and improve her chances to ambulate in the future.  She requires hospitalization at this time for further treatment.  Critical Care Total time providing critical care: 45 minutes           Final Clinical Impression(s) / ED Diagnoses Final diagnoses:  Closed fracture of left femur, unspecified fracture morphology, unspecified portion of femur, initial encounter ([place])  Fall, initial encounter    Rx / DC Orders ED Discharge Orders     None         Daleen Bo, MD 04/22/22 1642

## 2022-04-22 NOTE — ED Notes (Signed)
C collar removed by Eulis Foster, MD

## 2022-04-22 NOTE — Assessment & Plan Note (Signed)
-  Continue holding oral hypoglycemic agents while inpatient -Continue sliding scale insulin and follow CBGs to further adjust hypoglycemic regimen as required. -Continue to minimize nephrotoxic agents, maintain adequate hydration and follow renal function trend.

## 2022-04-22 NOTE — Assessment & Plan Note (Signed)
-  In the setting of mechanical fall -Stable vitamin D level appreciated for her age. -Patient is scheduled for surgical repair on 04/23/2022; will follow orthopedic service postoperative recommendations. -Continue as needed analgesics/muscle relaxant -PT/OT after surgical intervention following hip/femur fracture protocol. -SCDs and aspirin for DVT prophylaxis (aspirin dose as per Ortho recommendations).

## 2022-04-22 NOTE — Assessment & Plan Note (Signed)
-  Continue heart healthy diet, Pravachol and the use of zetia

## 2022-04-22 NOTE — ED Triage Notes (Signed)
Pt in from [facility] Dementia unit with fall in the hall way, pt c/o L shoulder pain and L hip pain, pt noted to have internal leg rotation and shortening, pt in c collar, pt neuro baseline demented, pt received 200 mg Fentanyl pta, per report pt has been hoarse and having a non productive cough, pt has bil #20 IVs saline locked

## 2022-04-23 ENCOUNTER — Encounter (HOSPITAL_COMMUNITY): Payer: Self-pay | Admitting: Internal Medicine

## 2022-04-23 ENCOUNTER — Inpatient Hospital Stay (HOSPITAL_COMMUNITY): Payer: Medicare Other | Admitting: Anesthesiology

## 2022-04-23 ENCOUNTER — Inpatient Hospital Stay (HOSPITAL_COMMUNITY): Payer: Medicare Other

## 2022-04-23 ENCOUNTER — Encounter (HOSPITAL_COMMUNITY)
Admission: EM | Disposition: A | Discharge status: Skilled Nursing Facility | Payer: Self-pay | Source: Skilled Nursing Facility | Attending: Internal Medicine

## 2022-04-23 ENCOUNTER — Other Ambulatory Visit: Payer: Self-pay

## 2022-04-23 DIAGNOSIS — E785 Hyperlipidemia, unspecified: Secondary | ICD-10-CM | POA: Diagnosis not present

## 2022-04-23 DIAGNOSIS — I129 Hypertensive chronic kidney disease with stage 1 through stage 4 chronic kidney disease, or unspecified chronic kidney disease: Secondary | ICD-10-CM

## 2022-04-23 DIAGNOSIS — N1831 Chronic kidney disease, stage 3a: Secondary | ICD-10-CM

## 2022-04-23 DIAGNOSIS — S72022A Displaced fracture of epiphysis (separation) (upper) of left femur, initial encounter for closed fracture: Secondary | ICD-10-CM | POA: Diagnosis not present

## 2022-04-23 DIAGNOSIS — E1122 Type 2 diabetes mellitus with diabetic chronic kidney disease: Secondary | ICD-10-CM

## 2022-04-23 DIAGNOSIS — Z7984 Long term (current) use of oral hypoglycemic drugs: Secondary | ICD-10-CM

## 2022-04-23 DIAGNOSIS — F03918 Unspecified dementia, unspecified severity, with other behavioral disturbance: Secondary | ICD-10-CM | POA: Diagnosis not present

## 2022-04-23 DIAGNOSIS — S7222XA Displaced subtrochanteric fracture of left femur, initial encounter for closed fracture: Secondary | ICD-10-CM

## 2022-04-23 HISTORY — PX: INTRAMEDULLARY (IM) NAIL INTERTROCHANTERIC: SHX5875

## 2022-04-23 LAB — CBC
HCT: 30.2 % — ABNORMAL LOW (ref 36.0–46.0)
Hemoglobin: 9.9 g/dL — ABNORMAL LOW (ref 12.0–15.0)
MCH: 31 pg (ref 26.0–34.0)
MCHC: 32.8 g/dL (ref 30.0–36.0)
MCV: 94.7 fL (ref 80.0–100.0)
Platelets: 212 10*3/uL (ref 150–400)
RBC: 3.19 MIL/uL — ABNORMAL LOW (ref 3.87–5.11)
RDW: 13.6 % (ref 11.5–15.5)
WBC: 20.5 10*3/uL — ABNORMAL HIGH (ref 4.0–10.5)
nRBC: 0 % (ref 0.0–0.2)

## 2022-04-23 LAB — BASIC METABOLIC PANEL
Anion gap: 8 (ref 5–15)
BUN: 29 mg/dL — ABNORMAL HIGH (ref 8–23)
CO2: 21 mmol/L — ABNORMAL LOW (ref 22–32)
Calcium: 8.9 mg/dL (ref 8.9–10.3)
Chloride: 106 mmol/L (ref 98–111)
Creatinine, Ser: 1.16 mg/dL — ABNORMAL HIGH (ref 0.44–1.00)
GFR, Estimated: 45 mL/min — ABNORMAL LOW (ref >60)
Glucose, Bld: 230 mg/dL — ABNORMAL HIGH (ref 70–99)
Potassium: 4.6 mmol/L (ref 3.5–5.1)
Sodium: 135 mmol/L (ref 135–145)

## 2022-04-23 LAB — SURGICAL PCR SCREEN
MRSA, PCR: NEGATIVE
Staphylococcus aureus: NEGATIVE

## 2022-04-23 SURGERY — FIXATION, FRACTURE, INTERTROCHANTERIC, WITH INTRAMEDULLARY ROD
Anesthesia: Spinal | Site: Hip | Laterality: Left

## 2022-04-23 MED ORDER — LACTATED RINGERS IV SOLN: INTRAVENOUS | Status: DC | PRN

## 2022-04-23 MED ORDER — STERILE WATER FOR IRRIGATION IR SOLN
Status: DC | PRN
  Administered 2022-04-23 (×2): 1000 mL

## 2022-04-23 MED ORDER — LIDOCAINE HCL (PF) 2 % IJ SOLN
INTRAMUSCULAR | Status: AC
  Filled 2022-04-23: qty 5

## 2022-04-23 MED ORDER — SODIUM CHLORIDE 0.9 % IV SOLN: INTRAVENOUS | Status: DC

## 2022-04-23 MED ORDER — BUPIVACAINE HCL (PF) 0.5 % IJ SOLN
INTRAMUSCULAR | Status: AC
  Filled 2022-04-23: qty 30

## 2022-04-23 MED ORDER — FENTANYL CITRATE (PF) 100 MCG/2ML IJ SOLN
INTRAMUSCULAR | Status: DC | PRN
  Administered 2022-04-23: 50 ug via INTRAVENOUS
  Administered 2022-04-23 (×2): 25 ug via INTRAVENOUS

## 2022-04-23 MED ORDER — PHENYLEPHRINE HCL-NACL 20-0.9 MG/250ML-% IV SOLN
INTRAVENOUS | Status: DC | PRN
  Administered 2022-04-23: 40 ug/min via INTRAVENOUS

## 2022-04-23 MED ORDER — ROCURONIUM BROMIDE 10 MG/ML (PF) SYRINGE
PREFILLED_SYRINGE | INTRAVENOUS | Status: AC
  Filled 2022-04-23: qty 10

## 2022-04-23 MED ORDER — SODIUM CHLORIDE 0.9 % IR SOLN
Status: DC | PRN
  Administered 2022-04-23: 1000 mL

## 2022-04-23 MED ORDER — PROPOFOL 10 MG/ML IV BOLUS
INTRAVENOUS | Status: DC | PRN
  Administered 2022-04-23 (×4): 20 mg via INTRAVENOUS

## 2022-04-23 MED ORDER — ENSURE MAX PROTEIN PO LIQD
11.0000 [oz_av] | Freq: Every day | ORAL | Status: DC
  Administered 2022-04-24: 11 [oz_av] via ORAL
  Filled 2022-04-23: qty 330

## 2022-04-23 MED ORDER — DEXAMETHASONE SODIUM PHOSPHATE 10 MG/ML IJ SOLN
INTRAMUSCULAR | Status: AC
  Filled 2022-04-23: qty 1

## 2022-04-23 MED ORDER — PROPOFOL 500 MG/50ML IV EMUL
INTRAVENOUS | Status: DC | PRN
  Administered 2022-04-23: 30 ug/kg/min via INTRAVENOUS

## 2022-04-23 MED ORDER — PROPOFOL 10 MG/ML IV BOLUS
INTRAVENOUS | Status: AC
  Filled 2022-04-23: qty 20

## 2022-04-23 MED ORDER — PHENYLEPHRINE HCL (PRESSORS) 10 MG/ML IV SOLN
INTRAVENOUS | Status: DC | PRN
  Administered 2022-04-23: 160 ug via INTRAVENOUS
  Administered 2022-04-23 (×2): 80 ug via INTRAVENOUS

## 2022-04-23 MED ORDER — BUPIVACAINE HCL (PF) 0.75 % IJ SOLN
INTRAMUSCULAR | Status: DC | PRN
  Administered 2022-04-23: 15 mg

## 2022-04-23 MED ORDER — FENTANYL CITRATE (PF) 100 MCG/2ML IJ SOLN
INTRAMUSCULAR | Status: AC
  Filled 2022-04-23: qty 2

## 2022-04-23 MED ORDER — SODIUM CHLORIDE 0.9 % IV BOLUS
250.0000 mL | Freq: Once | INTRAVENOUS | Status: AC
Start: 2022-04-23 — End: 2022-04-23
  Administered 2022-04-23: 250 mL via INTRAVENOUS

## 2022-04-23 MED ORDER — PHENYLEPHRINE 80 MCG/ML (10ML) SYRINGE FOR IV PUSH (FOR BLOOD PRESSURE SUPPORT)
PREFILLED_SYRINGE | INTRAVENOUS | Status: DC | PRN
  Administered 2022-04-23: 80 ug via INTRAVENOUS

## 2022-04-23 MED ORDER — MUPIROCIN 2 % EX OINT
1.0000 | TOPICAL_OINTMENT | Freq: Two times a day (BID) | CUTANEOUS | Status: DC
  Administered 2022-04-23 – 2022-04-26 (×5): 1 via NASAL
  Filled 2022-04-23: qty 22

## 2022-04-23 MED ORDER — PHENYLEPHRINE 80 MCG/ML (10ML) SYRINGE FOR IV PUSH (FOR BLOOD PRESSURE SUPPORT)
PREFILLED_SYRINGE | INTRAVENOUS | Status: AC
  Filled 2022-04-23: qty 10

## 2022-04-23 MED ORDER — ONDANSETRON HCL 4 MG/2ML IJ SOLN
INTRAMUSCULAR | Status: AC
  Filled 2022-04-23: qty 2

## 2022-04-23 SURGICAL SUPPLY — 57 items
APL PRP STRL LF DISP 70% ISPRP (MISCELLANEOUS) ×1
BIT DRILL 4.0X280 (BIT) ×1 IMPLANT
BLADE SURG SZ10 CARB STEEL (BLADE) ×2 IMPLANT
BNDG GAUZE ELAST 4 BULKY (GAUZE/BANDAGES/DRESSINGS) ×2 IMPLANT
BRUSH SCRUB EZ W/ULTRADEX 3%PC (MISCELLANEOUS) ×1 IMPLANT
CHLORAPREP W/TINT 26 (MISCELLANEOUS) ×2 IMPLANT
CLOTH BEACON ORANGE TIMEOUT ST (SAFETY) ×2 IMPLANT
COVER LIGHT HANDLE STERIS (MISCELLANEOUS) ×4 IMPLANT
COVER MAYO STAND XLG (MISCELLANEOUS) ×2 IMPLANT
COVER PERINEAL POST (MISCELLANEOUS) ×2 IMPLANT
DRAPE STERI IOBAN 125X83 (DRAPES) ×2 IMPLANT
DRSG MEPILEX SACRM 8.7X9.8 (GAUZE/BANDAGES/DRESSINGS) ×2 IMPLANT
DRSG PAD ABDOMINAL 8X10 ST (GAUZE/BANDAGES/DRESSINGS) ×2 IMPLANT
DRSG TEGADERM 4X4.75 (GAUZE/BANDAGES/DRESSINGS) ×9 IMPLANT
ELECT REM PT RETURN 9FT ADLT (ELECTROSURGICAL) ×2
ELECTRODE REM PT RTRN 9FT ADLT (ELECTROSURGICAL) ×1 IMPLANT
GAUZE SPONGE 4X4 12PLY STRL (GAUZE/BANDAGES/DRESSINGS) ×2 IMPLANT
GAUZE XEROFORM 1X8 LF (GAUZE/BANDAGES/DRESSINGS) ×4 IMPLANT
GLOVE BIOGEL PI IND STRL 7.0 (GLOVE) ×2 IMPLANT
GLOVE BIOGEL PI INDICATOR 7.0 (GLOVE) ×2
GLOVE SRG 8 PF TXTR STRL LF DI (GLOVE) ×1 IMPLANT
GLOVE SURG SS PI 8.0 STRL IVOR (GLOVE) ×5 IMPLANT
GLOVE SURG UNDER POLY LF SZ8 (GLOVE) ×2
GOWN STRL REUS W/ TWL XL LVL3 (GOWN DISPOSABLE) ×1 IMPLANT
GOWN STRL REUS W/TWL LRG LVL3 (GOWN DISPOSABLE) ×2 IMPLANT
GOWN STRL REUS W/TWL XL LVL3 (GOWN DISPOSABLE) ×2
GUIDEWIRE BALL NOSE 3.0X900 (WIRE) ×6
GUIDEWIRE ORTH 900X3XBALL NOSE (WIRE) IMPLANT
INST SET MAJOR BONE (KITS) ×2 IMPLANT
KIT BLADEGUARD II DBL (SET/KITS/TRAYS/PACK) ×2 IMPLANT
KIT TURNOVER CYSTO (KITS) ×2 IMPLANT
MANIFOLD NEPTUNE II (INSTRUMENTS) ×2 IMPLANT
MARKER SKIN DUAL TIP RULER LAB (MISCELLANEOUS) ×2 IMPLANT
NAIL LEFT 10X125X30 ES (Nail) ×1 IMPLANT
NDL HYPO 21X1.5 SAFETY (NEEDLE) ×1 IMPLANT
NEEDLE HYPO 21X1.5 SAFETY (NEEDLE) ×2 IMPLANT
NS IRRIG 1000ML POUR BTL (IV SOLUTION) ×2 IMPLANT
PACK BASIC III (CUSTOM PROCEDURE TRAY) ×2
PACK SRG BSC III STRL LF ECLPS (CUSTOM PROCEDURE TRAY) ×1 IMPLANT
PAD ARMBOARD 7.5X6 YLW CONV (MISCELLANEOUS) ×2 IMPLANT
PENCIL SMOKE EVACUATOR COATED (MISCELLANEOUS) ×2 IMPLANT
PIN GUIDE THRD AR 3.2X330 (PIN) ×2 IMPLANT
SCREW LOCK CORT 5X36 (Screw) ×1 IMPLANT
SCREW LOCK LAG 10.5X95 GALILEO (Screw) ×1 IMPLANT
SET BASIN LINEN APH (SET/KITS/TRAYS/PACK) ×2 IMPLANT
SPONGE T-LAP 18X18 ~~LOC~~+RFID (SPONGE) ×4 IMPLANT
STAPLER VISISTAT (STAPLE) ×1 IMPLANT
SUT MNCRL AB 4-0 PS2 18 (SUTURE) ×1 IMPLANT
SUT MON AB 2-0 CT1 36 (SUTURE) ×3 IMPLANT
SUT VIC AB 0 CT1 27 (SUTURE) ×4
SUT VIC AB 0 CT1 27XBRD ANTBC (SUTURE) ×1 IMPLANT
SYR 30ML LL (SYRINGE) ×2 IMPLANT
SYR BULB IRRIG 60ML STRL (SYRINGE) ×4 IMPLANT
TOOL ACTIVATION (INSTRUMENTS) ×1 IMPLANT
TRAY FOLEY MTR SLVR 16FR STAT (SET/KITS/TRAYS/PACK) ×2 IMPLANT
WATER STERILE IRR 1000ML POUR (IV SOLUTION) ×1 IMPLANT
YANKAUER SUCT BULB TIP 10FT TU (MISCELLANEOUS) ×2 IMPLANT

## 2022-04-23 NOTE — Progress Notes (Signed)
Attempted to give sips of water and bit on straw.  When finally took sip, spit it out

## 2022-04-23 NOTE — Progress Notes (Signed)
   04/23/22 1815  Assess: MEWS Score  BP (!) 113/43  MAP (mmHg) 65  Pulse Rate 90  Assess: MEWS Score  MEWS Temp 0  MEWS Systolic 0  MEWS Pulse 0  MEWS RR 0  MEWS LOC 0  MEWS Score 0  MEWS Score Color Green  Assess: if the MEWS score is Yellow or Red  Were vital signs taken at a resting state? Yes  Treat  Pain Score Asleep  Breathing 1  Negative Vocalization 1  Facial Expression 1  Body Language 1  Consolability 1  PAINAD Score 5  Patients response to intervention Effective  Take Vital Signs  Increase Vital Sign Frequency  Yellow: Q 2hr X 2 then Q 4hr X 2, if remains yellow, continue Q 4hrs  Notify: Provider  Provider Name/Title Dr. Dyann Kief  Date Provider Notified 04/23/22  Time Provider Notified 1822  Method of Notification Page  Date of Provider Response 04/23/22  Time of Provider Response 1822  Document  Patient Outcome Stabilized after interventions  Progress note created (see row info) Yes  Assess: SIRS CRITERIA  SIRS Temperature  0  SIRS Pulse 0  SIRS Respirations  0  SIRS WBC 1  SIRS Score Sum  1

## 2022-04-23 NOTE — Anesthesia Preprocedure Evaluation (Addendum)
Anesthesia Evaluation  Patient identified by MRN, date of birth, ID band Patient confused    Reviewed: Allergy & Precautions, H&P , NPO status , Patient's Chart, lab work & pertinent test results, reviewed documented beta blocker date and time , Unable to perform ROS - Chart review only  History of Anesthesia Complications (+) PROLONGED EMERGENCE, Emergence Delirium and history of anesthetic complications  Airway Mallampati: II  TM Distance: >3 FB Neck ROM: full    Dental no notable dental hx.    Pulmonary neg pulmonary ROS,    Pulmonary exam normal breath sounds clear to auscultation       Cardiovascular Exercise Tolerance: Good hypertension, negative cardio ROS   Rhythm:regular Rate:Normal     Neuro/Psych PSYCHIATRIC DISORDERS Anxiety Depression Dementia TIA   GI/Hepatic Neg liver ROS, hiatal hernia,   Endo/Other  negative endocrine ROSdiabetes  Renal/GU CRFRenal disease  negative genitourinary   Musculoskeletal   Abdominal   Peds  Hematology  (+) Blood dyscrasia, anemia ,   Anesthesia Other Findings   Reproductive/Obstetrics negative OB ROS                            Anesthesia Physical Anesthesia Plan  ASA: 3  Anesthesia Plan: Spinal   Post-op Pain Management:    Induction:   PONV Risk Score and Plan: Propofol infusion  Airway Management Planned:   Additional Equipment:   Intra-op Plan:   Post-operative Plan:   Informed Consent: I have reviewed the patients History and Physical, chart, labs and discussed the procedure including the risks, benefits and alternatives for the proposed anesthesia with the patient or authorized representative who has indicated his/her understanding and acceptance.     Dental Advisory Given  Plan Discussed with: CRNA  Anesthesia Plan Comments:         Anesthesia Quick Evaluation

## 2022-04-23 NOTE — Progress Notes (Signed)
BP now 113/43  pulse 90.  Dressings continue dry and intact, ice in place  Sleeping except when receiving care and the yells .

## 2022-04-23 NOTE — Transfer of Care (Signed)
Immediate Anesthesia Transfer of Care Note  Patient: Kristy [name]  Procedure(s) Performed: INTRAMEDULLARY (IM) NAIL INTERTROCHANTRIC (Left: Hip)  Patient Location: PACU  Anesthesia Type:Spinal  Level of Consciousness: drowsy  Airway & Oxygen Therapy: Patient Spontanous Breathing and Patient connected to nasal cannula oxygen  Post-op Assessment: Report given to RN and Post -op Vital signs reviewed and stable  Post vital signs: Reviewed and stable  Last Vitals:  Vitals Value Taken Time  BP    Temp    Pulse 103 04/23/22 1546  Resp 24 04/23/22 1546  SpO2 83 % 04/23/22 1546  Vitals shown include unvalidated device data.  Last Pain:  Vitals:   04/23/22 0800  TempSrc:   PainSc: Asleep         Complications: No notable events documented.

## 2022-04-23 NOTE — Anesthesia Procedure Notes (Signed)
Spinal  Patient location during procedure: OR Start time: 04/23/2022 12:51 PM Reason for block: surgical anesthesia Staffing Performed: other anesthesia staff  Other anesthesia staff: Corlis Hove, RN Performed by: Karna Dupes, CRNA Authorized by: Louann Sjogren, MD   Preanesthetic Checklist Completed: patient identified, IV checked, site marked, risks and benefits discussed, surgical consent, monitors and equipment checked, pre-op evaluation and timeout performed Spinal Block Patient position: left lateral decubitus Prep: ChloraPrep Patient monitoring: heart rate, cardiac monitor, continuous pulse ox and blood pressure Approach: midline Location: L3-4 Injection technique: single-shot Needle Needle type: Pencan  Needle gauge: 22 G Assessment Sensory level: T10

## 2022-04-23 NOTE — Op Note (Addendum)
Orthopaedic Surgery Operative Note (CSN: [id number])  Kristy [name]  02/07/1928 Date of Surgery: 04/23/2022   Diagnoses:  Left subtrochanteric femur fracture  Procedure: Cephalomedullary nail for Left subtrochanteric femur fracture   Operative Finding Successful completion of the planned procedure.  Placement of cephalomedullary nail with appropriate placement of lag screw and distal interlocking screw.  Fracture was reduced and held with a clamp, but upon completion of the nail, the clamp was removed and reduction was maintained.  No cerclage cable or tape was necessary to maintain reduction.    Post-Op Diagnosis: Same Surgeons:Primary: Oliver Barre, MD Assistants:  None Location: [facility] OR ROOM 4 Anesthesia: Sedation plus regional anesthesia Antibiotics: Ancef 2 g Tourniquet time: N/A Estimated Blood Loss: 250 cc Complications: None Specimens: None Implants: Implant Name Type Inv. Item Serial No. Manufacturer Lot No. LRB No. Used Action  NAIL LEFT [id number] ES - [id number] Nail NAIL LEFT [id number]  [employer] STERILE ON SET Left 1 Implanted  SCREW LOCK LAG 10.5X95 GALILEO - [id number] Screw SCREW LOCK LAG 10.5X95 GALILEO  [employer] STERILE ON SET Left 1 Implanted  SCREW LOCK CORT 5X36 - [id number] Screw SCREW LOCK CORT 5X36  [employer] STERILE ON SET Left 1 Implanted    Indications for Surgery:   Kristy [name] is a 88 y.o. female who had a mechanical fall and sustained a Left subtrochanteric femur fracture.  I recommended operative fixation to restore stability and allow the patient to ambulate immediately postop.  Benefits and risks of operative and nonoperative management were discussed prior to surgery with patient's son and informed consent form was completed.  Specific risks including infection, need for additional surgery, persistent pain, bleeding, malunion, nonunion and more severe complications associated with anesthesia.  The patient/guardian elected to proceed and  surgical consent was obtained.    Procedure:   The patient was identified properly. Informed consent was obtained and the surgical site was marked. The patient was taken to the OR where spinal anesthesia with sedation was induced.   The patient was placed supine on a fracture table and appropriate reduction was obtained and visualized on fluoroscopy prior to the beginning of the procedure.    Timeout was performed before the beginning of the case.  Ancef 2 g dosing was confirmed prior to making incision.  The patient received TXA prior to the start of surgery.   We made an incision proximal to the greater trochanter and dissected down through the fascia.  We then carefully placed a guidepin, localizing under fluoroscopy.  Once satisfied with the starting point, the entry reamer was used to gain entry into the intramedullary canal.    Orthogonal views under fluoroscopy demonstrated improved reduction, without anatomic alignment.  We attempted to place a ball tip guidewire, but we were unsuccessful.  We then used the finger reduction tool, but this was also unsuccessful.  At this point, I made the decision to make an incision, in line with the placement of the cephalomedullary lag screw in order to reduce and clamp the fracture.  I dissected through the IT band and to the lateral aspect of the femur.  With the assistance of fluoroscopy, we were able to place a verbrugge clamp and reduce the fracture.  This clamp maintained the reduction throughout the case.  Next, we were able to pass the guidewire with the assistance of the finger reduction tool.  This was advanced under fluoroscopic guidance to an appropriate level in the distal femur and measurement was  obtained proximally using fluoroscopy.  We selected the appropriate length of nail, as noted above.  The entry reamer was used, but the nail was unreamed.  At this point we placed our nail localizing under fluoroscopy, and confirmed that it was at the  appropriate level.  Next we used the outrigger device to pass a wire into the femoral neck, and then the cephalomedullary lag screw.  The screw was locked proximally to avoid over collapse.  We then turned our attention to the distal interlocking screw.  Once again, we used the outrigger device to place a single interlocking screw in the midshaft area.  At this point, the verbrugge clamp was removed and fluoroscopy confirmed there was no residual displacement at the fracture.  The outrigger device was removed and final fluoroscopic images were obtained.  The wounds were thoroughly irrigated closed in a multilayer fashion with 0 vicryl, 2-0 monocryl and staples.  Sterile dressings were placed.  The patient was awoken from anesthesia and taken to the PACU in stable condition without complication.     Post-operative plan:  Weightbearing: The patient will be WBAT on the operative extremity.   DVT prophylaxis per primary team, no orthopedic contraindications.  Recommend 81 mg Aspirin BID, unless patient cannot tolerate or was previously on anticoagulation.  Prefer to start Ppx POD#1.  SCDs BLE Pain control with PRN pain medication preferring oral medicines.   Dressing can be reinforced as needed, will change on POD#2/3 if needed.  Patient does not need to remain hospitalized for dressing change Follow up plan: approximately 2 weeks postop for incision check and XR.  If the patient will be returning to a nursing facility, staples can be removed around this time and a follow up appointment can be scheduled for 6 weeks after surgery. XR at next visit:  please obtain AP pelvis, and 2 views of the Left hip

## 2022-04-23 NOTE — Progress Notes (Addendum)
From [facility] after hip surgery.  Three gauze dressings dry and intact.  Patient alert and combative when receiving care and sleeps in between. Hypotensive and contacted Dr. Dyann Kief who is ordering bolus.  Patient yells that she has to pee and has purwick in.  SCDs  and ice pack in place.   Heels elevated on pillow

## 2022-04-23 NOTE — TOC Initial Note (Signed)
Transition of Care [facility]) - Initial/Assessment Note    Patient Details  Name: Kristy [name] MRN: [id number] Date of Birth: May 23, 1928  [facility] CM/SW Contact:    Iona Beard, [place] Phone Number: 04/23/2022, 11:04 AM  Clinical Narrative:                 TOC noted per chart review pt is from [facility]. CSW spoke with pts son who states she has been at that facility for about a month and the plan is for return to [place] at D/C.   CSW reached out to [facility] in admissions at [facility]. CSW awaiting response. TOC to follow.   Expected Discharge Plan: Skilled Nursing Facility Barriers to Discharge: Continued Medical Work up   Patient Goals and CMS Choice Patient states their goals for this hospitalization and ongoing recovery are:: return to SNF CMS Medicare.gov Compare Post Acute Care list provided to:: Patient Choice offered to / list presented to : Patient  Expected Discharge Plan and Services Expected Discharge Plan: [place] In-house Referral: Clinical Social Work Discharge Planning Services: CM Consult Post Acute Care Choice: [place] Living arrangements for the past 2 months: [place]                                      Prior Living Arrangements/Services Living arrangements for the past 2 months: [place] Lives with:: Facility Resident Patient language and need for interpreter reviewed:: Yes Do you feel safe going back to the place where you live?: Yes      Need for Family Participation in Patient Care: Yes (Comment) Care giver support system in place?: Yes (comment)   Criminal Activity/Legal Involvement Pertinent to Current Situation/Hospitalization: No - Comment as needed  Activities of Daily Living Home Assistive Devices/Equipment: Environmental consultant (specify type), Wheelchair ADL Screening (condition at time of admission) Patient's cognitive ability adequate to safely complete daily  activities?: No Is the patient deaf or have difficulty hearing?: Yes Does the patient have difficulty seeing, even when wearing glasses/contacts?: Yes Does the patient have difficulty concentrating, remembering, or making decisions?: Yes Patient able to express need for assistance with ADLs?: Yes Does the patient have difficulty dressing or bathing?: Yes Independently performs ADLs?: No Communication: Needs assistance Is this a change from baseline?: Pre-admission baseline Dressing (OT): Needs assistance Is this a change from baseline?: Pre-admission baseline Grooming: Needs assistance Is this a change from baseline?: Pre-admission baseline Feeding: Needs assistance Is this a change from baseline?: Pre-admission baseline Bathing: Needs assistance Is this a change from baseline?: Pre-admission baseline Toileting: Dependent Is this a change from baseline?: Pre-admission baseline In/Out Bed: Needs assistance Is this a change from baseline?: Pre-admission baseline Walks in Home: Needs assistance Is this a change from baseline?: Pre-admission baseline Does the patient have difficulty walking or climbing stairs?: Yes Weakness of Legs: Both Weakness of Arms/Hands: Both  Permission Sought/Granted                  Emotional Assessment         Alcohol / Substance Use: Not Applicable Psych Involvement: No (comment)  Admission diagnosis:  Fall, initial encounter [W19.XXXA] Closed fracture proximal femur, transepiphyseal, left, initial encounter ([place]) [S72.022A] Closed fracture of left femur, unspecified fracture morphology, unspecified portion of femur, initial encounter [facility]) [S72.92XA] Patient Active Problem List   Diagnosis Date Noted   Closed fracture  proximal femur, transepiphyseal, left, initial encounter ([place]) 04/22/2022   Dyslipidemia 04/22/2022   Chronic kidney disease, stage 3a ([place]) 04/22/2022   H/O TIA (transient ischemic attack) and stroke 04/22/2022   Increased  intraocular pressure 04/22/2022   Dementia with behavioral disturbance ([place]) 01/12/2020   Anemia 05/20/2019   Transient alteration of awareness 04/21/2019   Advanced nonexudative age-related macular degeneration of right eye with subfoveal involvement 03/12/2018   PCO (posterior capsule opacification), bilateral 03/12/2018   PVD (posterior vitreous detachment), both eyes 03/12/2018   Pseudophakia of both eyes 03/12/2018   HOH (hard of hearing) 02/05/2018   Incisional hernia, without obstruction or gangrene 05/27/2017   Primary open angle glaucoma (POAG) of both eyes, severe stage 02/10/2017   Type 2 diabetes with nephropathy ([place]) 07/07/2015   SVT (supraventricular tachycardia) ([place]) 02/08/2015   Depression 08/24/2013   Essential hypertension 06/08/2013   Hyperlipidemia associated with type 2 diabetes mellitus ([place]) 06/08/2013   Vitamin D deficiency 06/08/2013   Diabetes mellitus type 2 controlled 06/08/2013   OA (osteoarthritis) of knee 01/18/2013   CLL (chronic lymphocytic leukemia) ([place]) 06/17/2012   PCP:  System, Provider Not In Pharmacy:   [place], [place] - [address] [place] [address] Phone: [phone] Fax: [phone]  [facility] [place] [place] [address] Phone: [phone] Fax: [phone]  [id number] [id number]: [phone]: [phone]    Social Determinants of Health (SDOH) Interventions    Readmission Risk Interventions     No data to display

## 2022-04-23 NOTE — Progress Notes (Signed)
Consent signed by son and on chart.  SCD's appled and mouth swabbed with moisterizer appled to lips.  Son at bedside.

## 2022-04-23 NOTE — Progress Notes (Signed)
Initial Nutrition Assessment  DOCUMENTATION CODES:      INTERVENTION:  Ensure MAX daily (150 kcal, 30 gr protein)  Assist with feeding if indicated   NUTRITION DIAGNOSIS:   Increased nutrient needs related to hip fracture as evidenced by estimated needs.   GOAL:  Patient will meet greater than or equal to 90% of their needs  MONITOR:  Diet advancement, PO intake, Supplement acceptance, Labs, Weight trends, Skin  REASON FOR ASSESSMENT:   Consult Assessment of nutrition requirement/status  ASSESSMENT: Patient is a 88 yo female who presents from nursing facility [facility]) with Dementia, DM2, CKD3, Stroke and left proximal femur fracture due to fall.  Patient is very hard of hearing per family.   Patient NPO. No recent change in appetite and usual diet is regular textures and has not been requiring ONS to support nutrition intake and weight stability per son. Patient is resting and waiting to be taken for surgery. Will start high protein ONS following surgery (once pt is cleared to adv diet) due to increased protein needs.   Review of weights ->74-77 kg >2 years.   Medications reviewed and include: vitamin D3, B-12.   IVF-NS@ 125 ml/hr     Latest Ref Rng & Units 04/23/2022    4:27 AM 04/22/2022    9:53 AM 10/05/2021    7:59 AM  BMP  Glucose 70 - 99 mg/dL 230  183  169   BUN 8 - 23 mg/dL '29  24  10   '$ Creatinine 0.44 - 1.00 mg/dL 1.16  1.08  1.20   BUN/Creat Ratio 12 - 28   8   Sodium 135 - 145 mmol/L 135  132  139   Potassium 3.5 - 5.1 mmol/L 4.6  4.5  4.7   Chloride 98 - 111 mmol/L 106  104  104   CO2 22 - 32 mmol/L '21  22  22   '$ Calcium 8.9 - 10.3 mg/dL 8.9  8.9  9.6       NUTRITION - FOCUSED PHYSICAL EXAM:  Flowsheet Row Most Recent Value  Orbital Region No depletion  Upper Arm Region No depletion  Thoracic and Lumbar Region No depletion  Buccal Region Mild depletion  Temple Region Mild depletion  Clavicle Bone Region Mild depletion  Scapular Bone Region  Unable to assess  Dorsal Hand Moderate depletion  Patellar Region No depletion  Anterior Thigh Region Unable to assess  Posterior Calf Region Unable to assess  Edema (RD Assessment) Mild  Hair Reviewed  Eyes Unable to assess  Mouth Unable to assess  Skin Reviewed  Nails Reviewed       Diet Order:   Diet Order             Diet NPO time specified  Diet effective midnight                   EDUCATION NEEDS:  Not appropriate for education at this time  Skin:  Skin Assessment: Reviewed RN Assessment (pending surgery to left hip)  Last BM:  unknown  Height:   Ht Readings from Last 1 Encounters:  04/22/22 '5\' 5"'$  (1.651 m)    Weight:   Wt Readings from Last 1 Encounters:  04/22/22 74.3 kg    Ideal Body Weight:   57 kg   BMI:  Body mass index is 27.26 kg/m.  Estimated Nutritional Needs:   Kcal:  1400-1600  Protein:  70-75 gr  Fluid:  >1400 ml daily  Colman Cater MS,RD,CSG,LDN Contact:  AMION

## 2022-04-23 NOTE — Anesthesia Postprocedure Evaluation (Signed)
Anesthesia Post Note  Patient: Kristy [name]  Procedure(s) Performed: INTRAMEDULLARY (IM) NAIL INTERTROCHANTRIC (Left: Hip)  Patient location during evaluation: PACU Anesthesia Type: Spinal Level of consciousness: awake and alert Pain management: pain level controlled Vital Signs Assessment: post-procedure vital signs reviewed and stable Respiratory status: spontaneous breathing, nonlabored ventilation, respiratory function stable and patient connected to nasal cannula oxygen Cardiovascular status: blood pressure returned to baseline and stable Postop Assessment: no apparent nausea or vomiting Anesthetic complications: no   No notable events documented.   Last Vitals:  Vitals:   04/23/22 0431 04/23/22 1133  BP: (!) 167/71 (!) 148/64  [name]: 80 80  Resp: (!) 24 18  Temp: 36.5 C 37 C  SpO2: 94% 94%    Last Pain:  Vitals:   04/23/22 0800  TempSrc:   PainSc: [place]

## 2022-04-23 NOTE — Progress Notes (Signed)
Patient came to PACU at 1545, calm and asleep. During imaging post-surgery, patient was irritated, removing all EKG leads, pulling at IV lines, combative. Patient was calm and cooperative after settling back down from the rolling and movement and resting calmly. Report called to Nancy,RN.

## 2022-04-23 NOTE — Anesthesia Procedure Notes (Signed)
Date/Time: 04/23/2022 12:35 PM  Performed by: Karna Dupes, CRNAPre-anesthesia Checklist: Patient identified, Emergency Drugs available, Suction available and Patient being monitored Patient Re-evaluated:Patient Re-evaluated prior to induction Oxygen Delivery Method: Non-rebreather mask Placement Confirmation: positive ETCO2

## 2022-04-23 NOTE — Brief Op Note (Signed)
04/23/2022  3:44 PM  PATIENT:  Kristy [name]  88 y.o. female  PRE-OPERATIVE DIAGNOSIS:  Left subtrochanteric femur fracture  POST-OPERATIVE DIAGNOSIS:  Left subtrochanteric femur fracture  PROCEDURE:  Procedure(s): INTRAMEDULLARY (IM) NAIL INTERTROCHANTRIC (Left)  SURGEON:  Surgeon(s) and Role:    Mordecai Rasmussen, MD - Primary  PHYSICIAN ASSISTANT:   ASSISTANTS: none   ANESTHESIA:   spinal  EBL:  250 mL   BLOOD ADMINISTERED:none  DRAINS: none   LOCAL MEDICATIONS USED:  NONE  SPECIMEN:  No Specimen  DISPOSITION OF SPECIMEN:  N/A  COUNTS:  YES  TOURNIQUET:  * No tourniquets in log *  DICTATION: .Note written in EPIC  PLAN OF CARE: Admit to inpatient   PATIENT DISPOSITION:  PACU - hemodynamically stable.   Delay start of Pharmacological VTE agent (>24hrs) due to surgical blood loss or risk of bleeding: yes

## 2022-04-23 NOTE — Progress Notes (Signed)
Progress Note   Patient: Kristy [name] DOB: 02-Apr-1928 DOA: 04/22/2022     1 DOS: the patient was seen and examined on 04/23/2022   Brief hospital admission course: Kristy [name] is a 88 y.o. female with medical history significant of dementia with behavioral disorder, hypertension, hyperlipidemia, prior history of a stroke/TIA, type 2 diabetes mellitus and macular degeneration/increase intraocular pressure; who presented from the skilled nursing facility after unwitnessed mechanical fall with complains of left leg pain and inability to bear weight after that.   No fever, no chest pain, no nausea, no vomiting, no sick contacts or complaints of dysuria/hematuria, melena or hematochezia; but information may be inaccurate given patient underlying dementia and lack of credibility with info   In the ED work-up demonstrating displays fracture of left femur with physical signs of tenderness and deformity.  Case discussed with orthopedic surgery who felt patient will need surgical repair and the plan is for intervention later today 04/23/2022.  Assessment and Plan: * Closed fracture proximal femur, transepiphyseal, left, initial encounter ([place]) -In the setting of mechanical fall -Stable vitamin D level appreciated for her age. -Patient is scheduled for surgical repair on 04/23/2022; will follow orthopedic service postoperative recommendations. -Continue as needed analgesics/muscle relaxant -PT/OT after surgical intervention following hip/femur fracture protocol. -SCDs and aspirin for DVT prophylaxis (aspirin dose as per Ortho recommendations).  Increased intraocular pressure -Continue treatment with Lumigan and Piloc[place]ine sol -Patient also with history of macular degeneration; continue outpatient follow-up with ophthalmology service.  H/O TIA (transient ischemic attack) and stroke -No new focal deficits appreciated -Continue aspirin and statin; continue risk factor  modification.  Chronic kidney disease, stage 3a ([place]) -In the setting of diabetes -Renal function stable, well controlled and at baseline currently -Continue to maintain adequate hydration, minimize nephrotoxic agents, hypotension and the use of contrast. -Continue to follow-up renal function trend.  Dyslipidemia -Continue heart healthy diet, Pravachol and the use of zetia  Dementia with behavioral disturbance (HCC) -Overall stable mood -Continue constant reorientation and supportive care -Continue the use of BuSpar, trazodone and Lexapro.  Type 2 diabetes with nephropathy (HCC) -Continue holding oral hypoglycemic agents while inpatient -Continue sliding scale insulin and follow CBGs to further adjust hypoglycemic regimen as required. -Continue to minimize nephrotoxic agents, maintain adequate hydration and follow renal function trend.  Essential hypertension -Overall stable and well-controlled -Continue the use of metoprolol and follow vital signs.    Subjective:  Pleasantly confused; no chest pain, no nausea, no vomiting.  Complaining of left leg pain.  No overnight events.  Physical Exam: Vitals:   04/22/22 2047 04/23/22 0135 04/23/22 0431 04/23/22 1133  BP: (!) 152/69 137/80 (!) 167/71 (!) 148/64  Pulse: 73 76 80 80  Resp: 19 19 (!) 24 18  Temp: 97.9 F (36.6 C) 97.9 F (36.6 C) 97.7 F (36.5 C) 98.6 F (37 C)  TempSrc: Oral Oral Oral   SpO2: 96% 96% 94% 94%  Weight:      Height:       General exam: Very hard of hearing; afebrile, no chest pain, no nausea, no vomiting.  Complaining of left leg pain.  Pleasantly confused and oriented x1 intermittently. Respiratory system: Good oxygen saturation; no using accessory muscle.  No rhonchi's no crackles on exam. Cardiovascular system: Rate controlled, no rubs, no gallops, no JVD on exam.  Positive systolic murmur appreciated on exam. Gastrointestinal system: Abdomen is nondistended, soft and nontender. No organomegaly or  masses felt. Normal bowel sounds heard. Central nervous system:  No focal neurologic deficit. Extremities: No cyanosis or clubbing; limited movement on her left leg secondary to pain. Skin: No petechiae. Psychiatry: Judgement and insight appear impaired secondary to dementia. Data Reviewed: Basic metabolic panel demonstrating sodium 135, potassium 4.6, chloride 106, BUN 29, creatinine 1.16 CBC: WBCs 20.5, hemoglobin 9.9, platelet count 212 K  Family Communication: Son at bedside.  Disposition: Status is: Inpatient Remains inpatient appropriate because: Needing surgical intervention for left femur fracture repair and PT evaluation.   Planned Discharge Destination: Skilled nursing facility  Author: Barton Dubois, MD 04/23/2022 3:23 PM  For on call review [email or web address].

## 2022-04-23 NOTE — Progress Notes (Signed)
   ORTHOPAEDIC PROGRESS NOTE  Scheduled for Left Hip Cephalomedullary nail  DOS: 04/23/22  SUBJECTIVE: No issues over night.  Patient resting this morning.  No family at bedside.   OBJECTIVE: PE:  Resting.  Does not answer questions appropriately.  Left leg ER Obvious deformity to left thigh Toes are WWP 2+ DP pulse  Vitals:   04/23/22 0135 04/23/22 0431  BP: 137/80 (!) 167/71  Pulse: 76 80  Resp: 19 (!) 24  Temp: 97.9 F (36.6 C) 97.7 F (36.5 C)  SpO2: 96% 94%     ASSESSMENT: Kristy [name] is a 88 y.o. female stable.  Ready for OR.  NPO since midnight.   PLAN: Weightbearing: NWB LLE Insicional and dressing care: Reinforce dressings as needed; none currently Orthopedic device(s): None VTE prophylaxis: Recommend Aspirin '81mg'$  BID; to begin POD#1 Pain control: PO pain medications as needed; judicious use of narcotics Follow - up plan: 2 weeks   Contact information:     Amauris Debois A. Amedeo Kinsman, MD [place] [address] [place],  [place]  [address] Phone: [phone] Fax: [phone]

## 2022-04-23 NOTE — Progress Notes (Signed)
   04/23/22 1652  Assess: MEWS Score  Temp 98.6 F (37 C)  BP (!) 71/41  MAP (mmHg) (!) 51  Pulse Rate 83  Resp 20  SpO2 94 %  Assess: MEWS Score  MEWS Temp 0  MEWS Systolic 2  MEWS Pulse 0  MEWS RR 0  MEWS LOC 0  MEWS Score 2  MEWS Score Color Yellow  Assess: if the MEWS score is Yellow or Red  Were vital signs taken at a resting state? Yes  Does the patient meet 2 or more of the SIRS criteria? No  MEWS guidelines implemented *See Row Information* No, previously red, continue vital signs every 4 hours  Treat  MEWS Interventions Other (Comment) (contacted Dr. Dyann Kief)  Pain Score Asleep  Breathing 0  Negative Vocalization 1  Facial Expression 1  Body Language 1  Consolability 1  PAINAD Score 4  Take Vital Signs  Increase Vital Sign Frequency  Yellow: Q 2hr X 2 then Q 4hr X 2, if remains yellow, continue Q 4hrs  Escalate  MEWS: Escalate Yellow: discuss with charge nurse/RN and consider discussing with provider and RRT  Notify: Charge Nurse/RN  Name of Charge Nurse/RN Notified Audrea Muscat  Date Charge Nurse/RN Notified 04/23/22  Time Charge Nurse/RN Notified 1700  Notify: Provider  Provider Name/Title Dr. Dyann Kief  Date Provider Notified 04/23/22  Time Provider Notified 1700  Method of Notification Page  Notification Reason Change in status  Provider response See new orders  Date of Provider Response 04/23/22  Time of Provider Response 1700  Document  Patient Outcome Stabilized after interventions  Progress note created (see row info) Yes  Assess: SIRS CRITERIA  SIRS Temperature  0  SIRS Pulse 0  SIRS Respirations  0  SIRS WBC 1  SIRS Score Sum  1

## 2022-04-23 NOTE — Care Management Important Message (Signed)
Important Message  Patient Details  Name: Kristy [name] MRN: [id number] Date of Birth: 1928-09-20   Medicare Important Message Given:  N/A - LOS <3 / Initial given by admissions     Tommy Medal 04/23/2022, 9:01 AM

## 2022-04-24 ENCOUNTER — Encounter (HOSPITAL_COMMUNITY): Payer: Self-pay | Admitting: Orthopedic Surgery

## 2022-04-24 DIAGNOSIS — S72022A Displaced fracture of epiphysis (separation) (upper) of left femur, initial encounter for closed fracture: Secondary | ICD-10-CM | POA: Diagnosis not present

## 2022-04-24 LAB — BASIC METABOLIC PANEL
Anion gap: 9 (ref 5–15)
BUN: 44 mg/dL — ABNORMAL HIGH (ref 8–23)
CO2: 18 mmol/L — ABNORMAL LOW (ref 22–32)
Calcium: 8.2 mg/dL — ABNORMAL LOW (ref 8.9–10.3)
Chloride: 108 mmol/L (ref 98–111)
Creatinine, Ser: 1.63 mg/dL — ABNORMAL HIGH (ref 0.44–1.00)
GFR, Estimated: 30 mL/min — ABNORMAL LOW (ref >60)
Glucose, Bld: 309 mg/dL — ABNORMAL HIGH (ref 70–99)
Potassium: 5.2 mmol/L — ABNORMAL HIGH (ref 3.5–5.1)
Sodium: 135 mmol/L (ref 135–145)

## 2022-04-24 LAB — CBC
HCT: 19.4 % — ABNORMAL LOW (ref 36.0–46.0)
Hemoglobin: 6.3 g/dL — CL (ref 12.0–15.0)
MCH: 30.6 pg (ref 26.0–34.0)
MCHC: 32.5 g/dL (ref 30.0–36.0)
MCV: 94.2 fL (ref 80.0–100.0)
Platelets: 197 10*3/uL (ref 150–400)
RBC: 2.06 MIL/uL — ABNORMAL LOW (ref 3.87–5.11)
RDW: 13.8 % (ref 11.5–15.5)
WBC: 19.9 10*3/uL — ABNORMAL HIGH (ref 4.0–10.5)
nRBC: 0 % (ref 0.0–0.2)

## 2022-04-24 LAB — RETICULOCYTES
Immature Retic Fract: 27.2 % — ABNORMAL HIGH (ref 2.3–15.9)
RBC.: 2.03 MIL/uL — ABNORMAL LOW (ref 3.87–5.11)
Retic Count, Absolute: 89.9 10*3/uL (ref 19.0–186.0)
Retic Ct Pct: 4.4 % — ABNORMAL HIGH (ref 0.4–3.1)

## 2022-04-24 LAB — HEMOGLOBIN AND HEMATOCRIT, BLOOD
HCT: 19 % — ABNORMAL LOW (ref 36.0–46.0)
HCT: 29 % — ABNORMAL LOW (ref 36.0–46.0)
Hemoglobin: 6.3 g/dL — CL (ref 12.0–15.0)
Hemoglobin: 10 g/dL — ABNORMAL LOW (ref 12.0–15.0)

## 2022-04-24 LAB — GLUCOSE, CAPILLARY
Glucose-Capillary: 462 mg/dL — ABNORMAL HIGH (ref 70–99)
Glucose-Capillary: 275 mg/dL — ABNORMAL HIGH (ref 70–99)
Glucose-Capillary: 124 mg/dL — ABNORMAL HIGH (ref 70–99)

## 2022-04-24 LAB — PREPARE RBC (CROSSMATCH)

## 2022-04-24 LAB — OCCULT BLOOD X 1 CARD TO LAB, STOOL: Fecal Occult Bld: POSITIVE — AB

## 2022-04-24 LAB — VITAMIN B12: Vitamin B-12: 1053 pg/mL — ABNORMAL HIGH (ref 180–914)

## 2022-04-24 LAB — POTASSIUM: Potassium: 5 mmol/L (ref 3.5–5.1)

## 2022-04-24 LAB — HEMOGLOBIN A1C
Hgb A1c MFr Bld: 6.2 % — ABNORMAL HIGH (ref 4.8–5.6)
Mean Plasma Glucose: 131.24 mg/dL

## 2022-04-24 LAB — FOLATE: Folate: 6.6 ng/mL (ref >5.9)

## 2022-04-24 LAB — IRON AND TIBC
Iron: 16 ug/dL — ABNORMAL LOW (ref 28–170)
Saturation Ratios: 7 % — ABNORMAL LOW (ref 10.4–31.8)
TIBC: 233 ug/dL — ABNORMAL LOW (ref 250–450)
UIBC: 217 ug/dL

## 2022-04-24 LAB — GLUCOSE, RANDOM: Glucose, Bld: 427 mg/dL — ABNORMAL HIGH (ref 70–99)

## 2022-04-24 LAB — FERRITIN: Ferritin: 184 ng/mL (ref 11–307)

## 2022-04-24 MED ORDER — SODIUM CHLORIDE 0.9% IV SOLUTION: Freq: Once | INTRAVENOUS | Status: AC

## 2022-04-24 MED ORDER — LACTATED RINGERS IV SOLN: INTRAVENOUS | Status: DC

## 2022-04-24 MED ORDER — INSULIN ASPART 100 UNIT/ML IJ SOLN: 0.0000 [IU] | Freq: Three times a day (TID) | INTRAMUSCULAR | Status: DC

## 2022-04-24 MED ORDER — INSULIN ASPART 100 UNIT/ML IJ SOLN
0.0000 [IU] | INTRAMUSCULAR | Status: DC
  Administered 2022-04-24: 3 [IU] via SUBCUTANEOUS
  Administered 2022-04-24: 20 [IU] via SUBCUTANEOUS
  Administered 2022-04-24: 11 [IU] via SUBCUTANEOUS
  Administered 2022-04-25 (×2): 4 [IU] via SUBCUTANEOUS
  Administered 2022-04-25: 3 [IU] via SUBCUTANEOUS
  Administered 2022-04-25 (×2): 4 [IU] via SUBCUTANEOUS
  Administered 2022-04-26: 3 [IU] via SUBCUTANEOUS
  Administered 2022-04-26: 4 [IU] via SUBCUTANEOUS
  Administered 2022-04-26: 3 [IU] via SUBCUTANEOUS
  Administered 2022-04-26: 4 [IU] via SUBCUTANEOUS

## 2022-04-24 MED ORDER — CEFAZOLIN SODIUM-DEXTROSE 2-4 GM/100ML-% IV SOLN
2.0000 g | Freq: Three times a day (TID) | INTRAVENOUS | Status: AC
  Administered 2022-04-24 – 2022-04-25 (×3): 2 g via INTRAVENOUS
  Filled 2022-04-24 (×3): qty 100

## 2022-04-24 MED ORDER — SODIUM ZIRCONIUM CYCLOSILICATE 10 G PO PACK
10.0000 g | PACK | Freq: Once | ORAL | Status: AC
  Administered 2022-04-24: 10 g via ORAL
  Filled 2022-04-24: qty 1

## 2022-04-24 MED ORDER — INSULIN ASPART 100 UNIT/ML IJ SOLN: 0.0000 [IU] | Freq: Every day | INTRAMUSCULAR | Status: DC

## 2022-04-24 MED ORDER — SODIUM CHLORIDE 0.9 % IV SOLN
250.0000 mg | Freq: Every day | INTRAVENOUS | Status: AC
  Administered 2022-04-24 – 2022-04-25 (×2): 250 mg via INTRAVENOUS
  Filled 2022-04-24 (×2): qty 20

## 2022-04-24 NOTE — Progress Notes (Addendum)
   ORTHOPAEDIC PROGRESS NOTE  S/p Left Hip Cephalomedullary nail for subtrochanteric femur fracture   DOS: 04/23/22  SUBJECTIVE: Rested well over night.  I&O catheter due to no UOP.  Limited PO intake.  No family at bedside.   OBJECTIVE: PE:  Resting.   SCDs in place Does not answer questions appropriately.  Dressings intact.  SS drainage on small dressing.  Toes are WWP 2+ DP pulse  Vitals:   04/24/22 0203 04/24/22 0614  BP: 119/80 (!) 130/50  Pulse: (!) 107 (!) 103  Resp: 20 20  Temp: 99.8 F (37.7 C) 99.3 F (37.4 C)  SpO2: 99% 93%      Latest Ref Rng & Units 04/24/2022    7:27 AM 04/24/2022    5:17 AM 04/23/2022    4:27 AM  CBC  WBC 4.0 - 10.5 K/uL  19.9  20.5   Hemoglobin 12.0 - 15.0 g/dL 6.3  6.3  9.9   Hematocrit 36.0 - 46.0 % 19.0  19.4  30.2   Platelets 150 - 400 K/uL  197  212      ASSESSMENT: Kristy [name] is a 88 y.o. female stable, low Hb this morning.  Will require transfusion this morning  PLAN: Weightbearing: WBAT LLE Insicional and dressing care: Reinforce dressings as needed Orthopedic device(s): None VTE prophylaxis: Recommend Aspirin 81mg  BID; to begin POD#1.  SCDs BLE Pain control: PO pain medications as needed; judicious use of narcotics Follow - up plan: approximately 2 weeks postop for incision check and XR.  If the patient will be returning to a nursing facility, staples can be removed around this time and a follow up appointment can be scheduled for 6 weeks after surgery.  Contact information:     Demarkus Remmel A. Dallas Schimke, MD MS [facility] [address] [place],  [place]  [address] Phone: [phone] Fax: [phone]

## 2022-04-24 NOTE — Progress Notes (Signed)
First unit of prbc infusing with no adverse reaction.  Drank almost all of ensure and about 120 mls or water and ate whole applesauce.  Took pills crushed in applesauce.

## 2022-04-24 NOTE — Progress Notes (Addendum)
Second unit of PRBC complete with no adverse reaction.  Mostly sleeps and is combative with care.  Stated had to pee and placed on bedpan but no voiding, just small bm.  Did drink most of glucerna and ate some applesauce and a few bites of rice

## 2022-04-24 NOTE — Progress Notes (Signed)
   04/24/22 1100  Provider Notification  Provider Name/Title Zierle-Ghosh  Date Provider Notified 04/24/22  Time Provider Notified [phone]  Method of Notification  (secure chat)  Notification Reason Critical result  Test performed and critical result Hemoglobin 6.3  Date Critical Result Received 04/24/22  Time Critical Result Received 0643  Provider response See new orders  Date of Provider Response 04/24/22  Time of Provider Response [phone]

## 2022-04-24 NOTE — Progress Notes (Signed)
Has not voided all day.  In and out cath yielded 300 mls tea colored urine

## 2022-04-24 NOTE — Progress Notes (Signed)
PROGRESS NOTE    Kristy [name]  BWL:[id number] DOB: 12-09-1927 DOA: 04/22/2022 PCP: System, Provider Not In   Brief Narrative:  Kristy [name] is a 88 y.o. female with medical history significant of dementia with behavioral disorder, hypertension, hyperlipidemia, prior history of a stroke/TIA, type 2 diabetes mellitus and macular degeneration/increase intraocular pressure; who presented from the skilled nursing facility after unwitnessed mechanical fall.  She was noted to have a closed fracture of her proximal femur and underwent surgical repair 7/25.  She is now noted to have acute anemia requiring PRBC transfusion.  Assessment & Plan:   Principal Problem:   Closed fracture proximal femur, transepiphyseal, left, initial encounter ([place]) Active Problems:   Essential hypertension   Type 2 diabetes with nephropathy (HCC)   Dementia with behavioral disturbance (HCC)   Dyslipidemia   Chronic kidney disease, stage 3a (HCC)   H/O TIA (transient ischemic attack) and stroke   Increased intraocular pressure  Assessment and Plan:  Closed fracture proximal femur, transepiphyseal, left, initial encounter ([place]) -In the setting of mechanical fall -Stable vitamin D level appreciated for her age. -Patient is scheduled for surgical repair on 04/23/2022; will follow orthopedic service postoperative recommendations. -Continue as needed analgesics/muscle relaxant -PT/OT after surgical intervention following hip/femur fracture protocol. -SCDs and aspirin for DVT prophylaxis-81 twice daily currently held due to anemia with stool occult pending  Acute anemia -Likely postoperative -2 unit PRBC transfusion 7/26 -Noted to have iron deficiency and will receive Feraheme -Follow CBC  Mild hyperkalemia -Administer dose of Lokelma and monitor   Increased intraocular pressure -Continue treatment with Lumigan and Pilocarpine sol -Patient also with history of macular degeneration; continue outpatient  follow-up with ophthalmology service.   H/O TIA (transient ischemic attack) and stroke -No new focal deficits appreciated -Continue aspirin and statin; continue risk factor modification.   Chronic kidney disease, stage 3a ([place]) -In the setting of diabetes -Renal function stable, well controlled and at baseline currently -Continue to maintain adequate hydration, minimize nephrotoxic agents, hypotension and the use of contrast. -Continue to follow-up renal function trend.   Dyslipidemia -Continue heart healthy diet, Pravachol and the use of zetia   Dementia with behavioral disturbance (HCC) -Overall stable mood -Continue constant reorientation and supportive care -Continue the use of BuSpar, trazodone and Lexapro.   Type 2 diabetes with nephropathy (HCC) -Continue holding oral hypoglycemic agents while inpatient -Continue sliding scale insulin and follow CBGs to further adjust hypoglycemic regimen as required. -Continue to minimize nephrotoxic agents, maintain adequate hydration and follow renal function trend.   Essential hypertension -Overall stable and well-controlled -Continue the use of metoprolol and follow vital signs.    DVT prophylaxis: SCDs Code Status: DNR Family Communication: None at bedside Disposition Plan:  Status is: Inpatient Remains inpatient appropriate because: Need for IV medications, transfusion  Consultants:  Orthopedics  Procedures:  IM nail to left hip 7/25  Antimicrobials:  Anti-infectives (From admission, onward)    Start     Dose/Rate Route Frequency Ordered Stop   04/23/22 1200  ceFAZolin (ANCEF) IVPB 2g/100 mL premix        2 g 200 mL/hr over 30 Minutes Intravenous On call to O.R. 04/22/22 1100 04/23/22 1242       Subjective: Patient seen and evaluated today with no new acute complaints or concerns. No acute concerns or events noted overnight.  Objective: Vitals:   04/24/22 0203 04/24/22 0614 04/24/22 0815 04/24/22 0835  BP:  119/80 (!) 130/50 (!) 124/47 (!) 108/51  Pulse: (!) 107 Marland Kitchen)  103 (!) 116 (!) 103  Resp: 20 20 (!) 24 (!) 24  Temp: 99.8 F (37.7 C) 99.3 F (37.4 C) 99 F (37.2 C) 98 F (36.7 C)  TempSrc:  Oral Oral Oral  SpO2: 99% 93% 93% 92%  Weight:      Height:        Intake/Output Summary (Last 24 hours) at 04/24/2022 1006 Last data filed at 04/24/2022 0825 Gross per 24 hour  Intake 3273.45 ml  Output 960 ml  Net 2313.45 ml   Filed Weights   04/22/22 1258 04/22/22 1307  Weight: 74.3 kg 74.3 kg    Examination:  General exam: Appears calm and comfortable  Respiratory system: Clear to auscultation. Respiratory effort normal. Cardiovascular system: S1 & S2 heard, RRR.  Gastrointestinal system: Abdomen is soft Central nervous system: Alert and awake Extremities: No edema, incisions to left hip C/D/I Skin: No significant lesions noted Psychiatry: Flat affect.    Data Reviewed: I have personally reviewed following labs and imaging studies  CBC: Recent Labs  Lab 04/22/22 0953 04/23/22 0427 04/24/22 0517 04/24/22 0727  WBC 18.3* 20.5* 19.9*  --   NEUTROABS 11.7*  --   --   --   HGB 10.2* 9.9* 6.3* 6.3*  HCT 30.8* 30.2* 19.4* 19.0*  MCV 93.3 94.7 94.2  --   PLT 163 212 197  --    Basic Metabolic Panel: Recent Labs  Lab 04/22/22 0953 04/23/22 0427 04/24/22 0517  NA 132* 135 135  K 4.5 4.6 5.2*  CL 104 106 108  CO2 22 21* 18*  GLUCOSE 183* 230* 309*  BUN 24* 29* 44*  CREATININE 1.08* 1.16* 1.63*  CALCIUM 8.9 8.9 8.2*   GFR: Estimated Creatinine Clearance: 22.7 mL/min (A) (by C-G formula based on SCr of 1.63 mg/dL (H)). Liver Function Tests: No results for input(s): "AST", "ALT", "ALKPHOS", "BILITOT", "PROT", "ALBUMIN" in the last 168 hours. No results for input(s): "LIPASE", "AMYLASE" in the last 168 hours. No results for input(s): "AMMONIA" in the last 168 hours. Coagulation Profile: Recent Labs  Lab 04/22/22 0953  INR 1.1   Cardiac Enzymes: No results for  input(s): "CKTOTAL", "CKMB", "CKMBINDEX", "TROPONINI" in the last 168 hours. BNP (last 3 results) No results for input(s): "PROBNP" in the last 8760 hours. HbA1C: No results for input(s): "HGBA1C" in the last 72 hours. CBG: No results for input(s): "GLUCAP" in the last 168 hours. Lipid Profile: No results for input(s): "CHOL", "HDL", "LDLCALC", "TRIG", "CHOLHDL", "LDLDIRECT" in the last 72 hours. Thyroid Function Tests: No results for input(s): "TSH", "T4TOTAL", "FREET4", "T3FREE", "THYROIDAB" in the last 72 hours. Anemia Panel: Recent Labs    04/24/22 0517  VITAMINB12 1,053*  FOLATE 6.6  FERRITIN 184  TIBC 233*  IRON 16*  RETICCTPCT 4.4*   Sepsis Labs: No results for input(s): "PROCALCITON", "LATICACIDVEN" in the last 168 hours.  Recent Results (from the past 240 hour(s))  Surgical PCR screen     Status: None   Collection Time: 04/23/22  4:40 AM   Specimen: Nasal Mucosa; Nasal Swab  Result Value Ref Range Status   MRSA, PCR NEGATIVE NEGATIVE Final   Staphylococcus aureus NEGATIVE NEGATIVE Final    Comment: (NOTE) The Xpert SA Assay (FDA approved for NASAL specimens in patients 31 years of age and older), is one component of a comprehensive surveillance program. It is not intended to diagnose infection nor to guide or monitor treatment. Performed at [facility], [address], [place], [place] [address]  Radiology Studies: DG FEMUR MIN 2 VIEWS LEFT  Result Date: 04/23/2022 CLINICAL DATA:  Fracture left femur EXAM: LEFT FEMUR 2 VIEWS COMPARISON:  04/22/2022 FINDINGS: There is interval reduction and internal fixation of displaced fracture of proximal shaft of left femur with intramedullary rod. There is a large caliber screw in the neck of the femur. There are pockets of air in the soft tissues. Skin staples are noted. There is previous left knee arthroplasty. Arterial calcifications are seen in soft tissues. IMPRESSION: Status post reduction and internal  fixation of fracture of proximal left femur. Electronically Signed   By: Elmer Picker M.D.   On: 04/23/2022 16:21   DG HIP UNILAT WITH PELVIS 2-3 VIEWS LEFT  Result Date: 04/23/2022 CLINICAL DATA:  Left femur ORIF EXAM: DG HIP (WITH OR WITHOUT PELVIS) 2-3V LEFT COMPARISON:  04/22/2022 FINDINGS: Ten C-arm fluoroscopic images were obtained intraoperatively and submitted for post operative interpretation. Images obtained during IM rod and screw fixation traversing proximal left femur fracture. 4 minutes 20 seconds fluoroscopy time utilized. Radiation dose: 146.19 mGy. Please see the performing provider's procedural report for further detail. IMPRESSION: Intraoperative ORIF proximal left femur fracture. Electronically Signed   By: Davina Poke D.O.   On: 04/23/2022 15:26   DG C-Arm 1-60 Min-No Report  Result Date: 04/23/2022 Fluoroscopy was utilized by the requesting physician.  No radiographic interpretation.   DG C-Arm 1-60 Min-No Report  Result Date: 04/23/2022 Fluoroscopy was utilized by the requesting physician.  No radiographic interpretation.   CT CERVICAL SPINE WO CONTRAST  Result Date: 04/22/2022 CLINICAL DATA:  88 year old female status post fall EXAM: CT CERVICAL SPINE WITHOUT CONTRAST TECHNIQUE: Multidetector CT imaging of the cervical spine was performed without intravenous contrast. Multiplanar CT image reconstructions were also generated. RADIATION DOSE REDUCTION: This exam was performed according to the departmental dose-optimization program which includes automated exposure control, adjustment of the mA and/or kV according to patient size and/or use of iterative reconstruction technique. COMPARISON:  CT 03/19/2022 FINDINGS: Motion artifact obscures bony detail. Alignment: Rightward rotation of C1 around the dens has increased since 03/19/2022. This is favored positional although given the degree of rotation, rotary subluxation can not be excluded. Additional alignment is  unchanged. Slight anterolisthesis C2 and C3. Skull base and vertebrae: No acute fracture. No primary bone lesion or focal pathologic process. Soft tissues and spinal canal: No prevertebral fluid or swelling. No visible canal hematoma. Disc levels: Multilevel disc space height loss greatest at C5-C6 and C6-C7 where it is advanced. Multilevel spondylosis with anterior and posterior osteophytes greatest at C5-C7. Advanced cervical facet arthropathy. The posterior disc osteophyte complexes at C3-C4, C4-C5, C5-C6, and C6-C7 cause encroachment upon the spinal canal. Multilevel neural foraminal narrowing secondary to uncovertebral hypertrophy and facet arthropathy. Greatest at C5-C7 where it is advanced. Upper chest: Negative. Other: None. IMPRESSION: 1. Increased rotation of C1 about the dens compared with 03/19/2022. This is favored positional though rotary subluxation can not be excluded. This finding was called by telephone at the time of interpretation on 04/22/2022 at 11:26 am to provider [facility] , who verbally acknowledged these results. 2. Chronic cervical spondylosis is not significantly changed from 03/19/2022. Electronically Signed   By: Placido Sou M.D.   On: 04/22/2022 11:27   CT HEAD WO CONTRAST  Result Date: 04/22/2022 CLINICAL DATA:  Polytrauma, blunt EXAM: CT HEAD WITHOUT CONTRAST TECHNIQUE: Contiguous axial images were obtained from the base of the skull through the vertex without intravenous contrast. RADIATION DOSE REDUCTION: This exam was  performed according to the departmental dose-optimization program which includes automated exposure control, adjustment of the mA and/or kV according to patient size and/or use of iterative reconstruction technique. COMPARISON:  CT head June twenty, 2023. FINDINGS: Brain: Motion limited study. Similar prior left PCA territory infarct with encephalomalacia in the left occipital lobe. Similar chronic microvascular disease and atrophy. No definite acute  large vascular territory infarct. No evidence of acute hemorrhage, mass lesion, midline shift, or hydrocephalus. Vascular: No obvious hyperdense vessel identified. Calcific intracranial atherosclerosis. Skull: No acute fracture. Sinuses/Orbits: Clear sinuses.  No acute orbital findings. Other: No mastoid effusions. IMPRESSION: 1. Motion limited study without evidence of acute intracranial abnormality. 2. Similar prior left PCA territory infarct. An MRI could provide more sensitive evaluation for acute infarct if clinically warranted. Electronically Signed   By: Margaretha Sheffield M.D.   On: 04/22/2022 10:49   DG FEMUR MIN 2 VIEWS LEFT  Result Date: 04/22/2022 CLINICAL DATA:  Fall with left hip pain and deformity EXAM: LEFT FEMUR 2 VIEWS; PELVIS - 1-2 VIEW COMPARISON:  Pelvis radiographs 02/07/2015 FINDINGS: Pelvis: There is an acute displaced fracture of the proximal left femur with significant medial and posterior angulation of the distal fragment. Femoroacetabular alignment is maintained. Right hip alignment is maintained. There is no right hip fracture. The SI joints and symphysis pubis are intact. Lower lumbar spine fusion hardware is noted. Femur: No additional femur fracture is seen. Postsurgical changes reflecting right knee arthroplasty are seen without evidence of complication. IMPRESSION: Acute displaced and posteromedially angulated proximal left femur fracture. Electronically Signed   By: Valetta Mole M.D.   On: 04/22/2022 10:18   DG Pelvis 1-2 Views  Result Date: 04/22/2022 CLINICAL DATA:  Fall with left hip pain and deformity EXAM: LEFT FEMUR 2 VIEWS; PELVIS - 1-2 VIEW COMPARISON:  Pelvis radiographs 02/07/2015 FINDINGS: Pelvis: There is an acute displaced fracture of the proximal left femur with significant medial and posterior angulation of the distal fragment. Femoroacetabular alignment is maintained. Right hip alignment is maintained. There is no right hip fracture. The SI joints and  symphysis pubis are intact. Lower lumbar spine fusion hardware is noted. Femur: No additional femur fracture is seen. Postsurgical changes reflecting right knee arthroplasty are seen without evidence of complication. IMPRESSION: Acute displaced and posteromedially angulated proximal left femur fracture. Electronically Signed   By: Valetta Mole M.D.   On: 04/22/2022 10:18   DG Chest 1 View  Result Date: 04/22/2022 CLINICAL DATA:  Fall with left hip pain and deformity. EXAM: CHEST  1 VIEW COMPARISON:  Chest radiograph 03/03/2019 FINDINGS: Cardiomediastinal silhouette is within normal limits. There is no focal consolidation or pulmonary edema. There is no pleural effusion or pneumothorax There is no acute osseous abnormality. No displaced rib fracture is seen. IMPRESSION: No radiographic evidence of acute cardiopulmonary process. Electronically Signed   By: Valetta Mole M.D.   On: 04/22/2022 10:14        Scheduled Meds:  sodium chloride   Intravenous Once   busPIRone  30 mg Oral QHS   cholecalciferol  1,000 Units Oral Daily   escitalopram  20 mg Oral Daily   ezetimibe  10 mg Oral Daily   insulin aspart  0-5 Units Subcutaneous QHS   insulin aspart  0-9 Units Subcutaneous TID WC   latanoprost  1 drop Both Eyes QHS   metoprolol succinate  25 mg Oral Daily   mupirocin ointment  1 Application Nasal BID   pilocarpine  1 drop Both Eyes BID  pravastatin  80 mg Oral QHS   Ensure Max Protein  11 oz Oral Daily   traZODone  50 mg Oral QHS   vitamin B-12  500 mcg Oral Daily   Continuous Infusions:  ferric gluconate (FERRLECIT) IVPB     methocarbamol (ROBAXIN) IV       LOS: 2 days    Time spent: 35 minutes    Ilija Maxim Darleen Crocker, DO [facility]  If 7PM-7AM, please contact night-coverage [email or web address] 04/24/2022, 10:06 AM

## 2022-04-24 NOTE — Progress Notes (Signed)
   04/24/22 3943  Provider Notification  Provider Name/Title Zierle-Ghosh  Date Provider Notified 04/24/22  Time Provider Notified [phone]  Method of Notification  (secure chat)  Notification Reason Critical result  Test performed and critical result Hemoglobin 6.3  Date Critical Result Received 04/24/22  Time Critical Result Received [phone]

## 2022-04-24 NOTE — Progress Notes (Signed)
Patient complaining of having to pee with no output from external catheter and distended abdomen. Bladder scanner reading 793. Notified provider and order placed for In and Out cath. 785m yellow, cloudy urine drained from In and Out cath, patient tolerated well.

## 2022-04-24 NOTE — Progress Notes (Signed)
Pt swatted at me when I attempted to give meds tonight. Tried one more time and patient swatted again and yelled that she did not want to take anything. When I tried to calm her by hold her hand she squeezed my hand really tightly and yelled at me to leaver her alone. Med admin unsuccessful.

## 2022-04-24 NOTE — Progress Notes (Signed)
   04/24/22 0835  Assess: MEWS Score  Temp 98 F (36.7 C) (has been eating and drinking so lower temp)  BP (!) 108/51  Pulse Rate (!) 103  Resp (!) 24  SpO2 92 %  O2 Device Room Air  Assess: MEWS Score  MEWS Temp 0  MEWS Systolic 0  MEWS Pulse 1  MEWS RR 1  MEWS LOC 0  MEWS Score 2  MEWS Score Color Yellow  Assess: if the MEWS score is Yellow or Red  Were vital signs taken at a resting state? Yes  Focused Assessment No change from prior assessment  Does the patient meet 2 or more of the SIRS criteria? Yes  Does the patient have a confirmed or suspected source of infection? No  MEWS guidelines implemented *See Row Information* No, previously yellow, continue vital signs every 4 hours  Treat  MEWS Interventions Administered scheduled meds/treatments  Notify: Charge Nurse/RN  Name of Charge Nurse/RN Notified Angel  Date Charge Nurse/RN Notified 04/24/22  Time Charge Nurse/RN Notified 0840  Document  Patient Outcome Other (Comment) (had not had metoprolol previous day due to surg, gave metorprolol)  Progress note created (see row info) Yes  Assess: SIRS CRITERIA  SIRS Temperature  0  SIRS Pulse 1  SIRS Respirations  1  SIRS WBC 1  SIRS Score Sum  3

## 2022-04-24 NOTE — Inpatient Diabetes Management (Signed)
Inpatient Diabetes Program Recommendations  AACE/ADA: New Consensus Statement on Inpatient Glycemic Control  Target Ranges:  Prepandial:   less than 140 mg/dL      Peak postprandial:   less than 180 mg/dL (1-2 hours)      Critically ill patients:  140 - 180 mg/dL    Latest Reference Range & Units 04/22/22 09:53 04/23/22 04:27 04/24/22 05:17  Glucose 70 - 99 mg/dL 183 (H) 230 (H) 309 (H)   Review of Glycemic Control  Diabetes history: DM2 Outpatient Diabetes medications: Amaryl 2 mg QAM, Metformin XR 1000 mg QAM Current orders for Inpatient glycemic control: None  Inpatient Diabetes Program Recommendations:    Insulin: Please consider ordering CBGs and Novolog 0-9 units TID with meals and Novolog 0-5 units QHS.  Thanks, Barnie Alderman, RN, MSN, [place] Diabetes Coordinator Inpatient Diabetes Program [phone] (Team Pager from 8am to [place])

## 2022-04-25 ENCOUNTER — Telehealth: Payer: Self-pay | Admitting: Gastroenterology

## 2022-04-25 DIAGNOSIS — S72022A Displaced fracture of epiphysis (separation) (upper) of left femur, initial encounter for closed fracture: Secondary | ICD-10-CM | POA: Diagnosis not present

## 2022-04-25 DIAGNOSIS — R195 Other fecal abnormalities: Secondary | ICD-10-CM

## 2022-04-25 LAB — BASIC METABOLIC PANEL
Anion gap: 8 (ref 5–15)
BUN: 50 mg/dL — ABNORMAL HIGH (ref 8–23)
CO2: 22 mmol/L (ref 22–32)
Calcium: 8.6 mg/dL — ABNORMAL LOW (ref 8.9–10.3)
Chloride: 109 mmol/L (ref 98–111)
Creatinine, Ser: 1.47 mg/dL — ABNORMAL HIGH (ref 0.44–1.00)
GFR, Estimated: 33 mL/min — ABNORMAL LOW (ref >60)
Glucose, Bld: 164 mg/dL — ABNORMAL HIGH (ref 70–99)
Potassium: 4.3 mmol/L (ref 3.5–5.1)
Sodium: 139 mmol/L (ref 135–145)

## 2022-04-25 LAB — TYPE AND SCREEN
ABO/RH(D): A POS
Antibody Screen: NEGATIVE
Unit division: 0
Unit division: 0

## 2022-04-25 LAB — BPAM RBC
Blood Product Expiration Date: 202308092359
Blood Product Expiration Date: 202308142359
ISSUE DATE / TIME: 202307260814
ISSUE DATE / TIME: 202307261116
Unit Type and Rh: 6200
Unit Type and Rh: 6200

## 2022-04-25 LAB — CBC
HCT: 30.2 % — ABNORMAL LOW (ref 36.0–46.0)
Hemoglobin: 10 g/dL — ABNORMAL LOW (ref 12.0–15.0)
MCH: 30.5 pg (ref 26.0–34.0)
MCHC: 33.1 g/dL (ref 30.0–36.0)
MCV: 92.1 fL (ref 80.0–100.0)
Platelets: 149 10*3/uL — ABNORMAL LOW (ref 150–400)
RBC: 3.28 MIL/uL — ABNORMAL LOW (ref 3.87–5.11)
RDW: 15.1 % (ref 11.5–15.5)
WBC: 22.6 10*3/uL — ABNORMAL HIGH (ref 4.0–10.5)
nRBC: 0.3 % — ABNORMAL HIGH (ref 0.0–0.2)

## 2022-04-25 LAB — GLUCOSE, CAPILLARY
Glucose-Capillary: 129 mg/dL — ABNORMAL HIGH (ref 70–99)
Glucose-Capillary: 170 mg/dL — ABNORMAL HIGH (ref 70–99)
Glucose-Capillary: 172 mg/dL — ABNORMAL HIGH (ref 70–99)
Glucose-Capillary: 181 mg/dL — ABNORMAL HIGH (ref 70–99)
Glucose-Capillary: 183 mg/dL — ABNORMAL HIGH (ref 70–99)
Glucose-Capillary: 97 mg/dL (ref 70–99)

## 2022-04-25 LAB — MAGNESIUM: Magnesium: 2 mg/dL (ref 1.7–2.4)

## 2022-04-25 MED ORDER — CHLORHEXIDINE GLUCONATE CLOTH 2 % EX PADS
6.0000 | MEDICATED_PAD | Freq: Every day | CUTANEOUS | Status: DC
Start: 2022-04-25 — End: 2022-04-27
  Administered 2022-04-25 – 2022-04-26 (×2): 6 via TOPICAL

## 2022-04-25 MED ORDER — PANTOPRAZOLE SODIUM 40 MG IV SOLR
40.0000 mg | INTRAVENOUS | Status: DC
  Administered 2022-04-25: 40 mg via INTRAVENOUS
  Filled 2022-04-25: qty 10

## 2022-04-25 MED ORDER — PANTOPRAZOLE SODIUM 40 MG IV SOLR
40.0000 mg | Freq: Two times a day (BID) | INTRAVENOUS | Status: DC
  Administered 2022-04-26: 40 mg via INTRAVENOUS
  Filled 2022-04-25 (×2): qty 10

## 2022-04-25 NOTE — Evaluation (Signed)
Occupational Therapy Evaluation Patient Details Name: Kristy [name] MRN: [id number] DOB: [birth year] Today's Date: 04/25/2022   History of Present Illness Kristy [name] is a 88 y.o. female with medical history significant of dementia with behavioral disorder, hypertension, hyperlipidemia, prior history of a stroke/TIA, type 2 diabetes mellitus and macular degeneration/increase intraocular pressure; who presented from the skilled nursing facility after unwitnessed mechanical fall with complains of left leg pain and inability to bear weight after that.     No fever, no chest pain, no nausea, no vomiting, no sick contacts or complaints of dysuria/hematuria, melena or hematochezia; but information may be inaccurate given patient underlying dementia and lack of credibility with info     In the ED work-up demonstrating displays fracture of left femur with physical signs of tenderness and deformity.  Case discussed with orthopedic surgery who felt patient will need surgical repair and the plan is for intervention on 04/23/2022.  [facility] consulted to help with admission, evaluation and medical management. S/p Cephalomedullary nail for Left subtrochanteric femur fracture. (per MD)   Clinical Impression   Pt lethargic and wearing mittens upon entrance to room. Pt noted to be difficult to understand. Eyes were closed much of session. Unsure of pt's baseline mobility. Max A needed with sings of increased pain in B LE when bed mobility to sit at EOB. Poor sitting balance with L lateral lean. Limited L UE functional use and range of motion. Pt tolerating sitting at EOB for less than a minute prior to seeking to return to supine. Pt left in bed with bed alarm set and nurse present. Pt will benefit from continued OT in the hospital and recommended venue below to increase strength, balance, and endurance for safe ADL's.        Recommendations for follow up therapy are one component of a multi-disciplinary discharge  planning process, led by the attending physician.  Recommendations may be updated based on patient status, additional functional criteria and insurance authorization.   Follow Up Recommendations  Skilled nursing-short term rehab (<3 hours/day)    Assistance Recommended at Discharge Frequent or constant Supervision/Assistance  Patient can return home with the following Two people to help with walking and/or transfers;A lot of help with bathing/dressing/bathroom;Assistance with cooking/housework;Assistance with feeding;Direct supervision/assist for medications management;Assist for transportation;Help with stairs or ramp for entrance    Functional Status Assessment  Patient has had a recent decline in their functional status and demonstrates the ability to make significant improvements in function in a reasonable and predictable amount of time.  Equipment Recommendations  None recommended by OT    Recommendations for Other Services       Precautions / Restrictions Precautions Precautions: Fall Precaution Comments: s/p Cephalomedullary nail for Left subtrochanteric femur fracture Restrictions Weight Bearing Restrictions: Yes LLE Weight Bearing: Weight bearing as tolerated      Mobility Bed Mobility Overal bed mobility: Needs Assistance Bed Mobility: Supine to Sit, Sit to Supine     Supine to sit: Max assist, HOB elevated Sit to supine: Max assist   General bed mobility comments: Pt lethargic and needing max A for bed mobility; signs of increased pain with movement.    Transfers                          Balance Overall balance assessment: Needs assistance Sitting-balance support: Feet supported, Bilateral upper extremity supported Sitting balance-Leahy Scale: Poor Sitting balance - Comments: seated EOB Postural control: Right lateral lean  ADL either performed or assessed with clinical judgement   ADL Overall ADL's  : Needs assistance/impaired     Grooming: Bed level;Moderate assistance;Maximal assistance   Upper Body Bathing: Moderate assistance;Maximal assistance   Lower Body Bathing: Total assistance;Bed level   Upper Body Dressing : Moderate assistance;Maximal assistance;Sitting   Lower Body Dressing: Total assistance;Bed level   Toilet Transfer: Maximal assistance;Total assistance;Stand-pivot   Toileting- Clothing Manipulation and Hygiene: Total assistance               Vision Patient Visual Report:  (Pt unable to participate in verbal history of vision.) Additional Comments: Unsure. Pt not cogntively able to participate today.                Pertinent Vitals/Pain Pain Assessment Pain Assessment: Faces Faces Pain Scale: Hurts whole lot Pain Location: B LE with movement. Pain Descriptors / Indicators: Grimacing, Guarding Pain Intervention(s): Limited activity within patient's tolerance, Monitored during session, Repositioned     Hand Dominance     Extremity/Trunk Assessment Upper Extremity Assessment Upper Extremity Assessment: Generalized weakness;LUE deficits/detail LUE Deficits / Details: 2-/5 active shoulder flexion noted. Limited to less than 50% P/ROM but unsure of pt resistances versus available range.   Lower Extremity Assessment Lower Extremity Assessment: Defer to PT evaluation       Communication Communication Communication: Expressive difficulties;HOH   Cognition Arousal/Alertness: Awake/alert, Lethargic Behavior During Therapy: Impulsive Overall Cognitive Status: History of cognitive impairments - at baseline                                                        Home Living Family/patient expects to be discharged to:: Skilled nursing facility                                        Prior Functioning/Environment Prior Level of Function : Needs assist             Mobility Comments: From SNF unsure of  prior level of mobility. ADLs Comments: Assited by SNF staff.        OT Problem List: Decreased strength;Decreased range of motion;Decreased activity tolerance;Impaired balance (sitting and/or standing);Decreased safety awareness;Pain      OT Treatment/Interventions: Self-care/ADL training;Therapeutic exercise;Therapeutic activities;Cognitive remediation/compensation;Patient/family education;Balance training    OT Goals(Current goals can be found in the care plan section) Acute Rehab OT Goals Patient Stated Goal: none stated OT Goal Formulation: Patient unable to participate in goal setting Time For Goal Achievement: 05/09/22 Potential to Achieve Goals: Fair  OT Frequency: Min 2X/week                                   End of Session Nurse Communication: Other (comment) (Present in room for much of evaluation.)  Activity Tolerance: Patient limited by lethargy Patient left: in bed;with nursing/sitter in room;with bed alarm set  OT Visit Diagnosis: Unsteadiness on feet (R26.81);Other abnormalities of gait and mobility (R26.89);History of falling (Z91.81);Muscle weakness (generalized) (M62.81);Pain Pain - Right/Left: Left Pain - part of body: Hip;Leg                Time: 5366-4403 OT Time Calculation (min): 12 min Charges:  OT  General Charges $OT Visit: 1 Visit OT Evaluation $OT Eval Low Complexity: 1 Low  Kristy [name] OT, MOT  Kristy [name] 04/25/2022, 10:03 AM

## 2022-04-25 NOTE — Consult Note (Addendum)
Gastroenterology Consult   Referring Provider: No ref. provider found Primary Care Physician:  System, Provider Not In Primary Gastroenterologist:  Dr.  Patient ID: Kristy [name]; [id number]; [birth year]   Admit date: 04/22/2022  LOS: 3 days   Date of Consultation: 04/25/2022  Reason for Consultation:  acute anemia s/p prbcs, heme + stool    History of Present Illness   Kristy [name] is a 88 y.o. female with dementia with behavioral disorder DM, h/o stroke, HTN presenting this admission from SNF after unwitnessed mechanical fall. Work up demonstrated left hip subtrochanteric femur fracture. She had surgical repair 04/23/22. GI consulted for acute drop in Hgb postoperatively along with occult positive stools.   Review of chart shows admission Hgb of 10.2, MCV 93.3. Postoperatively her Hgb dropped to 6.3. Estimated blood loss during surgery of 250cc per op note. She received 2 units of prbcs with increase in Hgb of 10.0. Pretransfusion anemia labs showed iron of 16, TIBC 233, iron sat 7%, ferritin, B12 1053, Folate 6.6, ferritin 184. Hgb in 09/2021 was normal at 12.5.   Per nursing staff, patient was heme positive but there has been no overt bleeding noted. She has been combative and difficult to get her to take po meds. She has had several in and out caths for urinary retention and now has foley catheter.   Remote EGD/colonoscopy in 2007 for heme + stools showed H. Pylori gastritis and diverticulosis.    Prior to Admission medications   Medication Sig Start Date End Date Taking? Authorizing Provider  busPIRone (BUSPAR) 30 MG tablet Take 1 tablet (30 mg total) by mouth at bedtime. Patient taking differently: Take 30 mg by mouth 2 (two) times daily. 04/04/21  Yes Dettinger, Fransisca Kaufmann, MD  cholecalciferol (VITAMIN D) 1000 UNITS tablet Take 1,000 Units by mouth daily.   Yes [provider]  escitalopram (LEXAPRO) 20 MG tablet Take 20 mg by mouth daily.   Yes [provider]   ezetimibe (ZETIA) 10 MG tablet Take 1 tablet (10 mg total) by mouth daily. 04/04/21  Yes Dettinger, Fransisca Kaufmann, MD  glimepiride (AMARYL) 4 MG tablet TAKE 1 TABLET BY MOUTH DAILY WITH BREAKFAST Patient taking differently: Take 2 mg by mouth daily with breakfast. 10/11/21  Yes Dettinger, Fransisca Kaufmann, MD  guaiFENesin (ROBITUSSIN) 100 MG/5ML liquid Take 5 mLs by mouth every 4 (four) hours as needed for cough or to loosen phlegm.   Yes [provider]  LUMIGAN 0.01 % SOLN Place 1 drop into both eyes at bedtime. 11/09/15  Yes [provider]  melatonin 3 MG TABS tablet Take 3 mg by mouth at bedtime.   Yes [provider]  metoprolol succinate (TOPROL-XL) 25 MG 24 hr tablet Take 1 tablet (25 mg total) by mouth daily. 10/05/21  Yes Dettinger, Fransisca Kaufmann, MD  Multiple Vitamins-Minerals (PRESERVISION AREDS 2 PO) Take by mouth.   Yes [provider]  oxybutynin (DITROPAN) 5 MG tablet Take 5 mg by mouth 2 (two) times daily.   Yes [provider]  pilocarpine (PILOCAR) 1 % ophthalmic solution Place 1 drop into both eyes 2 (two) times daily.   Yes [provider]  pravastatin (PRAVACHOL) 80 MG tablet Take 1 tablet (80 mg total) by mouth at bedtime. 10/05/21  Yes Dettinger, Fransisca Kaufmann, MD  traZODone (DESYREL) 50 MG tablet Take 50 mg by mouth at bedtime.   Yes [provider]  vitamin B-12 (CYANOCOBALAMIN) 500 MCG tablet Take 500 mcg by mouth daily.  Yes [provider]  glucose blood (FREESTYLE TEST STRIPS) test strip Check Blood sugar two times daily 08/24/18   Chipper Herb, MD  glucose blood (ONE TOUCH ULTRA TEST) test strip CHECK BLOOD SUGAR TWO TIMES DAILY 08/25/18   Chipper Herb, MD  Lancets [facility] ULTRASOFT) lancets Check blood sugars twice a day 09/11/18   Chipper Herb, MD  lisinopril (ZESTRIL) 10 MG tablet TAKE 1/2 TABLET BY MOUTH DAILY Patient not taking: Reported on 04/22/2022 04/04/22   Dettinger, Fransisca Kaufmann, MD  metFORMIN (GLUCOPHAGE-XR)  500 MG 24 hr tablet TAKE 2 TABLETS BY MOUTH EVERY DAY WITH BREAKFAST Patient not taking: Reported on 04/22/2022 10/05/21   Dettinger, Fransisca Kaufmann, MD    Current Facility-Administered Medications  Medication Dose Route Frequency Provider Last Rate Last Admin   busPIRone (BUSPAR) tablet 30 mg  30 mg Oral QHS Mordecai Rasmussen, MD   30 mg at 04/23/22 2146   ceFAZolin (ANCEF) IVPB 2g/100 mL premix  2 g Intravenous Q8H Mordecai Rasmussen, MD 200 mL/hr at 04/25/22 0510 2 g at 04/25/22 0510   cholecalciferol (VITAMIN D3) tablet 1,000 Units  1,000 Units Oral Daily Mordecai Rasmussen, MD   1,000 Units at 04/24/22 0811   escitalopram (LEXAPRO) tablet 20 mg  20 mg Oral Daily Mordecai Rasmussen, MD   20 mg at 04/24/22 0630   ezetimibe (ZETIA) tablet 10 mg  10 mg Oral Daily Mordecai Rasmussen, MD   10 mg at 04/24/22 1601   ferric gluconate (FERRLECIT) 250 mg in sodium chloride 0.9 % 250 mL IVPB  250 mg Intravenous Daily Heath Lark D, DO 135 mL/hr at 04/24/22 1635 250 mg at 04/24/22 1635   HYDROcodone-acetaminophen (NORCO/VICODIN) 5-325 MG per tablet 1-2 tablet  1-2 tablet Oral Q6H PRN Mordecai Rasmussen, MD   1 tablet at 04/24/22 1404   insulin aspart (novoLOG) injection 0-20 Units  0-20 Units Subcutaneous Q4H Heath Lark D, DO   4 Units at 04/25/22 0932   lactated ringers infusion   Intravenous Continuous Heath Lark D, DO 50 mL/hr at 04/24/22 1904 New Bag at 04/24/22 1904   latanoprost (XALATAN) 0.005 % ophthalmic solution 1 drop  1 drop Both Eyes QHS Mordecai Rasmussen, MD   1 drop at 04/23/22 2146   methocarbamol (ROBAXIN) tablet 500 mg  500 mg Oral Q8H PRN Mordecai Rasmussen, MD       Or   methocarbamol (ROBAXIN) 500 mg in dextrose 5 % 50 mL IVPB  500 mg Intravenous Q8H PRN Mordecai Rasmussen, MD       metoprolol succinate (TOPROL-XL) 24 hr tablet 25 mg  25 mg Oral Daily Mordecai Rasmussen, MD   25 mg at 04/24/22 3557   morphine (PF) 2 MG/ML injection 0.5 mg  0.5 mg Intravenous Q3H PRN Mordecai Rasmussen, MD   0.5 mg at 04/23/22 1640   mupirocin  ointment (BACTROBAN) 2 % 1 Application  1 Application Nasal BID Mordecai Rasmussen, MD   1 Application at 32/20/25 0830   ondansetron (ZOFRAN) injection 4 mg  4 mg Intravenous Q6H PRN Mordecai Rasmussen, MD       pantoprazole (PROTONIX) injection 40 mg  40 mg Intravenous Q24H Shah, Pratik D, DO       pilocarpine (PILOCAR) 1 % ophthalmic solution 1 drop  1 drop Both Eyes BID Mordecai Rasmussen, MD   1 drop at 04/24/22 0812   polyethylene glycol (MIRALAX / GLYCOLAX) packet 17 g  17 g Oral Daily PRN Mordecai Rasmussen, MD       pravastatin (PRAVACHOL) tablet 80 mg  80 mg Oral QHS Mordecai Rasmussen, MD   80 mg at 04/23/22 2145   protein supplement (ENSURE MAX) liquid  11 oz Oral Daily Mordecai Rasmussen, MD   11 oz at 04/24/22 0816   traZODone (DESYREL) tablet 50 mg  50 mg Oral QHS Mordecai Rasmussen, MD   50 mg at 04/23/22 2145   vitamin B-12 (CYANOCOBALAMIN) tablet 500 mcg  500 mcg Oral Daily Mordecai Rasmussen, MD   500 mcg at 04/24/22 0813    Allergies as of 04/22/2022 - Review Complete 04/22/2022  Allergen Reaction Noted   Vioxx [rofecoxib] Other (See Comments) 12/16/2011    Past Medical History:  Diagnosis Date   Anxiety    Colon polyps    Complication of anesthesia 01/18/2013   slow to awaken and felt crazy for 3-4 days   Dementia ([name])    Diabetes mellitus    Diverticulitis    H/O hiatal hernia    Hearing loss 01/07/2013   bilateral hearing aids   History of shingles 01/07/2013   3'14 recent outbreak- drying in    [place] (hard of hearing) 02/05/2018   Hypercholesteremia    Hypertension    Dr. Laurance Flatten   Lumbar spondylosis    osteoarthritis-knees   Macular degeneration of both eyes    and dry eyes   Stroke (Organ)    TIA    Past Surgical History:  Procedure Laterality Date   ABDOMINAL HYSTERECTOMY  1987   APPENDECTOMY     pt thinks this was removed when gall bladder was taken out   [place]   open   INTRAMEDULLARY (IM) NAIL INTERTROCHANTERIC Left 04/23/2022    Procedure: INTRAMEDULLARY (IM) NAIL INTERTROCHANTRIC;  Surgeon: Mordecai Rasmussen, MD;  Location: [facility];  Service: Orthopedics;  Laterality: Left;   KNEE ARTHROSCOPY  01-07-13   left knee- many yrs ago   Lumbar back surgery  2013   TOTAL KNEE ARTHROPLASTY Left 01/18/2013   Procedure: TOTAL KNEE ARTHROPLASTY;  Surgeon: Gearlean Alf, MD;  Location: [facility];  Service: Orthopedics;  Laterality: Left;   TOTAL KNEE ARTHROPLASTY Right 03/07/2014   Procedure: RIGHT TOTAL KNEE ARTHROPLASTY;  Surgeon: Gearlean Alf, MD;  Location: [facility];  Service: Orthopedics;  Laterality: Right;   VENTRAL HERNIA REPAIR      Family History  Problem Relation Age of Onset   Stroke Father 48   Heart disease Father    Irregular heart beat Father        pacemaker    Healthy Mother    Healthy Son    Heart disease Sister        unknown heart condition    Bipolar disorder Son    COPD Son    Cancer Sister        colon    Social History   Socioeconomic History   Marital status: Married    Spouse name: [name]    Number of children: 2   Years of education: Not on file   Highest education level: Not on file  Occupational History   Occupation: retired    Comment: banking   Tobacco Use   Smoking status: Never   Smokeless tobacco: Never  Vaping Use   Vaping Use: Never used  Substance and Sexual Activity   Alcohol use: No    Alcohol/week:  0.0 standard drinks of alcohol   Drug use: No   Sexual activity: Not Currently  Other Topics Concern   Not on file  Social History Narrative   Lives with husband   Social Determinants of Health   Financial Resource Strain: Not on file  Food Insecurity: Not on file  Transportation Needs: Not on file  Physical Activity: Not on file  Stress: Not on file  Social Connections: Not on file  Intimate Partner Violence: Not on file     Review of System:   Patient unable to provide history      Physical Examination:   Vital signs in last 24 hours: Temp:  [97.4 F  (36.3 C)-99 F (37.2 C)] 98.7 F (37.1 C) (07/27 0453) Pulse Rate:  [89-116] 100 (07/27 0453) Resp:  [18-24] 22 (07/27 0453) BP: (105-140)/(45-70) 140/65 (07/27 0453) SpO2:  [92 %-98 %] 98 % (07/27 0453) Last BM Date : 04/25/22  General: elderly female with hand mitts applied. She is resting. Does not converse. No acute distress.  Head: Normocephalic, atraumatic.   Eyes: Conjunctiva pale, no icterus. Mouth: Oropharyngeal mucosa moist and pink , no lesions erythema or exudate. Neck: Supple without thyromegaly, masses, or lymphadenopathy.  Lungs: Clear to auscultation bilaterally.  Heart: Regular rate and rhythm, no murmurs rubs or gallops.  Abdomen: Bowel sounds are normal, nondistended, no hepatosplenomegaly or masses, no abdominal bruits or hernia , no rebound or guarding. Rlq tenderness, patient reports pain Rectal: not performed Extremities: No lower extremity edema, clubbing, deformity.  Neuro: Alert and oriented x 4 , grossly normal neurologically.  Skin: Warm and dry, no rash or jaundice.   Psych: Alert and cooperative, normal mood and affect.        Intake/Output from previous day: 07/26 0701 - 07/27 0700 In: 1692.7 [P.O.:560; I.V.:115.4; Blood:647; IV Piggyback:370.2] Out: 1100 [Urine:1100] Intake/Output this shift: No intake/output data recorded.  Lab Results:   CBC Recent Labs    04/23/22 0427 04/24/22 0517 04/24/22 0727 04/24/22 1624 04/25/22 0456  WBC 20.5* 19.9*  --   --  22.6*  HGB 9.9* 6.3* 6.3* 10.0* 10.0*  HCT 30.2* 19.4* 19.0* 29.0* 30.2*  MCV 94.7 94.2  --   --  92.1  PLT 212 197  --   --  149*   BMET Recent Labs    04/23/22 0427 04/24/22 0517 04/24/22 1138 04/25/22 0456  NA 135 135  --  139  K 4.6 5.2* 5.0 4.3  CL 106 108  --  109  CO2 21* 18*  --  22  GLUCOSE 230* 309* 427* 164*  BUN 29* 44*  --  50*  CREATININE 1.16* 1.63*  --  1.47*  CALCIUM 8.9 8.2*  --  8.6*   LFT No results for input(s): "BILITOT", "BILIDIR", "IBILI",  "ALKPHOS", "AST", "ALT", "PROT", "ALBUMIN" in the last 72 hours.  Lipase No results for input(s): "LIPASE" in the last 72 hours.  PT/INR Recent Labs    04/22/22 0953  LABPROT 14.5  INR 1.1     Hepatitis Panel No results for input(s): "HEPBSAG", "HCVAB", "HEPAIGM", "HEPBIGM" in the last 72 hours.   Radiology/Studies DG FEMUR MIN 2 VIEWS LEFT  Result Date: 04/23/2022 CLINICAL DATA:  Fracture left femur EXAM: LEFT FEMUR 2 VIEWS COMPARISON:  04/22/2022 FINDINGS: There is interval reduction and internal fixation of displaced fracture of proximal shaft of left femur with intramedullary rod. There is a large caliber screw in the neck of the femur. There are pockets of air in  the soft tissues. Skin staples are noted. There is previous left knee arthroplasty. Arterial calcifications are seen in soft tissues. IMPRESSION: Status post reduction and internal fixation of fracture of proximal left femur. Electronically Signed   By: Elmer Picker M.D.   On: 04/23/2022 16:21   DG HIP UNILAT WITH PELVIS 2-3 VIEWS LEFT  Result Date: 04/23/2022 CLINICAL DATA:  Left femur ORIF EXAM: DG HIP (WITH OR WITHOUT PELVIS) 2-3V LEFT COMPARISON:  04/22/2022 FINDINGS: Ten C-arm fluoroscopic images were obtained intraoperatively and submitted for post operative interpretation. Images obtained during IM rod and screw fixation traversing proximal left femur fracture. 4 minutes 20 seconds fluoroscopy time utilized. Radiation dose: 146.19 mGy. Please see the performing provider's procedural report for further detail. IMPRESSION: Intraoperative ORIF proximal left femur fracture. Electronically Signed   By: Davina Poke D.O.   On: 04/23/2022 15:26   DG C-Arm 1-60 Min-No Report  Result Date: 04/23/2022 Fluoroscopy was utilized by the requesting physician.  No radiographic interpretation.   DG C-Arm 1-60 Min-No Report  Result Date: 04/23/2022 Fluoroscopy was utilized by the requesting physician.  No radiographic  interpretation.      Imaging Studies:   DG FEMUR MIN 2 VIEWS LEFT  Result Date: 04/23/2022 CLINICAL DATA:  Fracture left femur EXAM: LEFT FEMUR 2 VIEWS COMPARISON:  04/22/2022 FINDINGS: There is interval reduction and internal fixation of displaced fracture of proximal shaft of left femur with intramedullary rod. There is a large caliber screw in the neck of the femur. There are pockets of air in the soft tissues. Skin staples are noted. There is previous left knee arthroplasty. Arterial calcifications are seen in soft tissues. IMPRESSION: Status post reduction and internal fixation of fracture of proximal left femur. Electronically Signed   By: Elmer Picker M.D.   On: 04/23/2022 16:21   DG HIP UNILAT WITH PELVIS 2-3 VIEWS LEFT  Result Date: 04/23/2022 CLINICAL DATA:  Left femur ORIF EXAM: DG HIP (WITH OR WITHOUT PELVIS) 2-3V LEFT COMPARISON:  04/22/2022 FINDINGS: Ten C-arm fluoroscopic images were obtained intraoperatively and submitted for post operative interpretation. Images obtained during IM rod and screw fixation traversing proximal left femur fracture. 4 minutes 20 seconds fluoroscopy time utilized. Radiation dose: 146.19 mGy. Please see the performing provider's procedural report for further detail. IMPRESSION: Intraoperative ORIF proximal left femur fracture. Electronically Signed   By: Davina Poke D.O.   On: 04/23/2022 15:26   DG C-Arm 1-60 Min-No Report  Result Date: 04/23/2022 Fluoroscopy was utilized by the requesting physician.  No radiographic interpretation.   DG C-Arm 1-60 Min-No Report  Result Date: 04/23/2022 Fluoroscopy was utilized by the requesting physician.  No radiographic interpretation.   CT CERVICAL SPINE WO CONTRAST  Result Date: 04/22/2022 CLINICAL DATA:  88 year old female status post fall EXAM: CT CERVICAL SPINE WITHOUT CONTRAST TECHNIQUE: Multidetector CT imaging of the cervical spine was performed without intravenous contrast. Multiplanar CT  image reconstructions were also generated. RADIATION DOSE REDUCTION: This exam was performed according to the departmental dose-optimization program which includes automated exposure control, adjustment of the mA and/or kV according to patient size and/or use of iterative reconstruction technique. COMPARISON:  CT 03/19/2022 FINDINGS: Motion artifact obscures bony detail. Alignment: Rightward rotation of C1 around the dens has increased since 03/19/2022. This is favored positional although given the degree of rotation, rotary subluxation can not be excluded. Additional alignment is unchanged. Slight anterolisthesis C2 and C3. Skull base and vertebrae: No acute fracture. No primary bone lesion or focal pathologic process. Soft tissues  and spinal canal: No prevertebral fluid or swelling. No visible canal hematoma. Disc levels: Multilevel disc space height loss greatest at C5-C6 and C6-C7 where it is advanced. Multilevel spondylosis with anterior and posterior osteophytes greatest at C5-C7. Advanced cervical facet arthropathy. The posterior disc osteophyte complexes at C3-C4, C4-C5, C5-C6, and C6-C7 cause encroachment upon the spinal canal. Multilevel neural foraminal narrowing secondary to uncovertebral hypertrophy and facet arthropathy. Greatest at C5-C7 where it is advanced. Upper chest: Negative. Other: None. IMPRESSION: 1. Increased rotation of C1 about the dens compared with 03/19/2022. This is favored positional though rotary subluxation can not be excluded. This finding was called by telephone at the time of interpretation on 04/22/2022 at 11:26 am to provider [facility] , who verbally acknowledged these results. 2. Chronic cervical spondylosis is not significantly changed from 03/19/2022. Electronically Signed   By: Placido Sou M.D.   On: 04/22/2022 11:27   CT HEAD WO CONTRAST  Result Date: 04/22/2022 CLINICAL DATA:  Polytrauma, blunt EXAM: CT HEAD WITHOUT CONTRAST TECHNIQUE: Contiguous axial images  were obtained from the base of the skull through the vertex without intravenous contrast. RADIATION DOSE REDUCTION: This exam was performed according to the departmental dose-optimization program which includes automated exposure control, adjustment of the mA and/or kV according to patient size and/or use of iterative reconstruction technique. COMPARISON:  CT head June twenty, 2023. FINDINGS: Brain: Motion limited study. Similar prior left PCA territory infarct with encephalomalacia in the left occipital lobe. Similar chronic microvascular disease and atrophy. No definite acute large vascular territory infarct. No evidence of acute hemorrhage, mass lesion, midline shift, or hydrocephalus. Vascular: No obvious hyperdense vessel identified. Calcific intracranial atherosclerosis. Skull: No acute fracture. Sinuses/Orbits: Clear sinuses.  No acute orbital findings. Other: No mastoid effusions. IMPRESSION: 1. Motion limited study without evidence of acute intracranial abnormality. 2. Similar prior left PCA territory infarct. An MRI could provide more sensitive evaluation for acute infarct if clinically warranted. Electronically Signed   By: Margaretha Sheffield M.D.   On: 04/22/2022 10:49   DG FEMUR MIN 2 VIEWS LEFT  Result Date: 04/22/2022 CLINICAL DATA:  Fall with left hip pain and deformity EXAM: LEFT FEMUR 2 VIEWS; PELVIS - 1-2 VIEW COMPARISON:  Pelvis radiographs 02/07/2015 FINDINGS: Pelvis: There is an acute displaced fracture of the proximal left femur with significant medial and posterior angulation of the distal fragment. Femoroacetabular alignment is maintained. Right hip alignment is maintained. There is no right hip fracture. The SI joints and symphysis pubis are intact. Lower lumbar spine fusion hardware is noted. Femur: No additional femur fracture is seen. Postsurgical changes reflecting right knee arthroplasty are seen without evidence of complication. IMPRESSION: Acute displaced and posteromedially  angulated proximal left femur fracture. Electronically Signed   By: Valetta Mole M.D.   On: 04/22/2022 10:18   DG Pelvis 1-2 Views  Result Date: 04/22/2022 CLINICAL DATA:  Fall with left hip pain and deformity EXAM: LEFT FEMUR 2 VIEWS; PELVIS - 1-2 VIEW COMPARISON:  Pelvis radiographs 02/07/2015 FINDINGS: Pelvis: There is an acute displaced fracture of the proximal left femur with significant medial and posterior angulation of the distal fragment. Femoroacetabular alignment is maintained. Right hip alignment is maintained. There is no right hip fracture. The SI joints and symphysis pubis are intact. Lower lumbar spine fusion hardware is noted. Femur: No additional femur fracture is seen. Postsurgical changes reflecting right knee arthroplasty are seen without evidence of complication. IMPRESSION: Acute displaced and posteromedially angulated proximal left femur fracture. Electronically Signed  By: Valetta Mole M.D.   On: 04/22/2022 10:18   DG Chest 1 View  Result Date: 04/22/2022 CLINICAL DATA:  Fall with left hip pain and deformity. EXAM: CHEST  1 VIEW COMPARISON:  Chest radiograph 03/03/2019 FINDINGS: Cardiomediastinal silhouette is within normal limits. There is no focal consolidation or pulmonary edema. There is no pleural effusion or pneumothorax There is no acute osseous abnormality. No displaced rib fracture is seen. IMPRESSION: No radiographic evidence of acute cardiopulmonary process. Electronically Signed   By: Valetta Mole M.D.   On: 04/22/2022 10:14  [4 week]  Assessment:   88 y/o female admitted admitted with unwitnessed fall.  Work-up revealed left hip subtrochanteric femur fracture.  She underwent surgical repair April 23, 2022.  GI consulted for acute drop in hemoglobin, Hemoccult positive stool.   Acute anemia: Hemoglobin normal in January 2023.  Upon presentation her hemoglobin was 10.2, normal MCV.  Iron, iron saturations, TIBC all low.  Ferritin normal. Suspect anemia of chronic  disease and acute blood loss anemia.  Postoperatively her hemoglobin dropped to 6.3.  Hemoglobin back up to 10 after  2 units of packed red blood cells.  Suspect some hemodilutional effect as well.  Overall mild decline in hemoglobin noted since January, likely multifactorial, no evidence of significant iron depletion with normal ferritin.  I spoke with patient's son, Kristy [name].  He is not aware of patient having any issues at SNF with regards to anemia, melena, rectal bleeding, concerns for oral intake.  Discussed with Kristy [name], patient likely has acute blood loss anemia related to her surgery.  From a GI standpoint, would recommend avoiding invasive procedures at this time.  Kristy [name] stated he also preferred less invasive approach, monitoring as appropriate.  RLQ tenderness: patient reports rlq tenderness on exam. History limited.  Abdominal exam overall benign. Possible musculoskeletal in setting of recent fall.  Possibly related to recent urinary retention.  Continue to monitor.  Could consider imaging if persistent clinical concern.  Leukocytosis: Work-up per attending.  Plan:   Recommend PPI coverage for prophylaxis/empirical treatment. From a GI standpoint, aspirin for use of postoperative DVT prophylaxis okay. Recommend following for any further decline in H&H or overt GI bleeding at which time could reassess for potential endoscopic evaluation.  For now we will hold off, patient's family in agreement.   LOS: 3 days   We would like to thank you for the opportunity to participate in the care of Kristy [name].  Laureen Ochs. Bernarda Caffey [facility] [phone] 7/27/20237:36 AM

## 2022-04-25 NOTE — Plan of Care (Signed)
  Problem: Acute Rehab OT Goals (only OT should resolve) Goal: Pt. Will Perform Grooming Flowsheets (Taken 04/25/2022 1005) Pt Will Perform Grooming:  with min assist  sitting Goal: Pt. Will Perform Upper Body Dressing Flowsheets (Taken 04/25/2022 1005) Pt Will Perform Upper Body Dressing:  with min assist  sitting Goal: Pt. Will Perform Lower Body Dressing Flowsheets (Taken 04/25/2022 1005) Pt Will Perform Lower Body Dressing:  with mod assist  sitting/lateral leans Goal: Pt. Will Transfer To Toilet Flowsheets (Taken 04/25/2022 1005) Pt Will Transfer to Toilet:  with mod assist  stand pivot transfer Goal: Pt/Caregiver Will Perform Home Exercise Program Flowsheets (Taken 04/25/2022 1005) Pt/caregiver will Perform Home Exercise Program:  Increased strength  Right Upper extremity  Increased ROM  Left upper extremity  With minimal assist  Briannia Laba OT, MOT

## 2022-04-25 NOTE — Progress Notes (Signed)
Patient foley placed for urinary retention per MD order. Patient bathed, peri care completed and foley inserted.  Approximately 800 cc of amber urine returned into foley.

## 2022-04-25 NOTE — Plan of Care (Signed)
  Problem: Acute Rehab PT Goals(only PT should resolve) Goal: Pt will Roll Supine to Side Outcome: Progressing Flowsheets (Taken 04/25/2022 1114) Pt will Roll Supine to Side: with mod assist Goal: Pt Will Go Supine/Side To Sit Outcome: Progressing Flowsheets (Taken 04/25/2022 1114) Pt will go Supine/Side to Sit:  with moderate assist  with HOB elevated Goal: Pt Will Go Sit To Supine/Side Outcome: Progressing Flowsheets (Taken 04/25/2022 1114) Pt will go Sit to Supine/Side: with moderate assist Goal: Patient Will Perform Sitting Balance Outcome: Progressing Flowsheets (Taken 04/25/2022 1114) Patient will perform sitting balance:  1-2 min  with bilateral UE support  with moderate assist Goal: Patient Will Transfer Sit To/From Stand Outcome: Progressing Flowsheets (Taken 04/25/2022 1114) Patient will transfer sit to/from stand:  with maximum assist  from elevated surface   Pamala Hurry D. Hartnett-Rands, MS, PT Per [place] [phone] 04/25/2022

## 2022-04-25 NOTE — NC FL2 (Signed)
[place] LEVEL OF CARE SCREENING TOOL     IDENTIFICATION  Patient Name: Kristy [name] Birthdate: [birth year] Sex: female Admission Date (Current Location): 04/22/2022  [facility] and [place] Number:  [employer] and Address:  [facility] [address], [place]      Provider Number: [id number]  Attending Physician Name and Address:  Rodena Goldmann, DO  Relative Name and Phone Number:       Current Level of Care: Hospital Recommended Level of Care: [place] Prior Approval Number:    Date Approved/Denied:   PASRR Number: [id number]  Discharge Plan: SNF    Current Diagnoses: Patient Active Problem List   Diagnosis Date Noted   Heme positive stool    Closed fracture proximal femur, transepiphyseal, left, initial encounter ([place]) 04/22/2022   Dyslipidemia 04/22/2022   Chronic kidney disease, stage 3a ([place]) 04/22/2022   H/O TIA (transient ischemic attack) and stroke 04/22/2022   Increased intraocular pressure 04/22/2022   Dementia with behavioral disturbance ([place]) 01/12/2020   Anemia 05/20/2019   Transient alteration of awareness 04/21/2019   Advanced nonexudative age-related macular degeneration of right eye with subfoveal involvement 03/12/2018   PCO (posterior capsule opacification), bilateral 03/12/2018   PVD (posterior vitreous detachment), both eyes 03/12/2018   Pseudophakia of both eyes 03/12/2018   HOH (hard of hearing) 02/05/2018   Incisional hernia, without obstruction or gangrene 05/27/2017   Primary open angle glaucoma (POAG) of both eyes, severe stage 02/10/2017   Type 2 diabetes with nephropathy ([place]) 07/07/2015   SVT (supraventricular tachycardia) ([place]) 02/08/2015   Depression 08/24/2013   Essential hypertension 06/08/2013   Hyperlipidemia associated with type 2 diabetes mellitus ([place]) 06/08/2013   Vitamin D deficiency 06/08/2013   Diabetes mellitus type 2 controlled 06/08/2013   OA  (osteoarthritis) of knee 01/18/2013   CLL (chronic lymphocytic leukemia) ([place]) 06/17/2012    Orientation RESPIRATION BLADDER Height & Weight        Normal Incontinent Weight: 163 lb 12.8 oz (74.3 kg) Height:  '5\' 5"'$  (165.1 cm)  BEHAVIORAL SYMPTOMS/MOOD NEUROLOGICAL BOWEL NUTRITION STATUS      Continent Diet (see DC summary)  AMBULATORY STATUS COMMUNICATION OF NEEDS Skin   Extensive Assist Verbally Other (Comment), Normal (Closed left hip surgical inscision)                       Personal Care Assistance Level of Assistance  Bathing, Feeding, Dressing Bathing Assistance: Maximum assistance Feeding assistance: Limited assistance Dressing Assistance: Maximum assistance     Functional Limitations Info  Sight, Hearing, Speech Sight Info: Impaired Hearing Info: Impaired Speech Info: Adequate    SPECIAL CARE FACTORS FREQUENCY  PT (By licensed PT), OT (By licensed OT)     PT Frequency: 5 times weekly OT Frequency: 5 times weekly            Contractures Contractures Info: Not present    Additional Factors Info  Code Status, Allergies Code Status Info: DNR Allergies Info: Vioxx (rofecoxib)           Current Medications (04/25/2022):  This is the current hospital active medication list Current Facility-Administered Medications  Medication Dose Route Frequency Provider Last Rate Last Admin   busPIRone (BUSPAR) tablet 30 mg  30 mg Oral QHS Mordecai Rasmussen, MD   30 mg at 04/23/22 2146   ceFAZolin (ANCEF) IVPB 2g/100 mL premix  2 g Intravenous Q8H Mordecai Rasmussen, MD 200 mL/hr at 04/25/22  0510 2 g at 04/25/22 0510   Chlorhexidine Gluconate Cloth 2 % PADS 6 each  6 each Topical Daily Heath Lark D, DO   6 each at 04/25/22 1245   cholecalciferol (VITAMIN D3) tablet 1,000 Units  1,000 Units Oral Daily Mordecai Rasmussen, MD   1,000 Units at 04/25/22 8099   escitalopram (LEXAPRO) tablet 20 mg  20 mg Oral Daily Mordecai Rasmussen, MD   20 mg at 04/25/22 0936   ezetimibe (ZETIA) tablet  10 mg  10 mg Oral Daily Mordecai Rasmussen, MD   10 mg at 04/25/22 0936   ferric gluconate (FERRLECIT) 250 mg in sodium chloride 0.9 % 250 mL IVPB  250 mg Intravenous Daily Heath Lark D, DO 135 mL/hr at 04/25/22 0951 250 mg at 04/25/22 0951   HYDROcodone-acetaminophen (NORCO/VICODIN) 5-325 MG per tablet 1-2 tablet  1-2 tablet Oral Q6H PRN Mordecai Rasmussen, MD   1 tablet at 04/24/22 1404   insulin aspart (novoLOG) injection 0-20 Units  0-20 Units Subcutaneous Q4H Heath Lark D, DO   4 Units at 04/25/22 8338   lactated ringers infusion   Intravenous Continuous Heath Lark D, DO 50 mL/hr at 04/24/22 1904 New Bag at 04/24/22 1904   latanoprost (XALATAN) 0.005 % ophthalmic solution 1 drop  1 drop Both Eyes QHS Mordecai Rasmussen, MD   1 drop at 04/23/22 2146   methocarbamol (ROBAXIN) tablet 500 mg  500 mg Oral Q8H PRN Mordecai Rasmussen, MD       Or   methocarbamol (ROBAXIN) 500 mg in dextrose 5 % 50 mL IVPB  500 mg Intravenous Q8H PRN Mordecai Rasmussen, MD       metoprolol succinate (TOPROL-XL) 24 hr tablet 25 mg  25 mg Oral Daily Mordecai Rasmussen, MD   25 mg at 04/25/22 0937   morphine (PF) 2 MG/ML injection 0.5 mg  0.5 mg Intravenous Q3H PRN Mordecai Rasmussen, MD   0.5 mg at 04/23/22 1640   mupirocin ointment (BACTROBAN) 2 % 1 Application  1 Application Nasal BID Mordecai Rasmussen, MD   1 Application at 25/05/39 0938   ondansetron (ZOFRAN) injection 4 mg  4 mg Intravenous Q6H PRN Mordecai Rasmussen, MD       pantoprazole (PROTONIX) injection 40 mg  40 mg Intravenous Q24H Manuella Ghazi, Pratik D, DO   40 mg at 04/25/22 0938   pilocarpine (PILOCAR) 1 % ophthalmic solution 1 drop  1 drop Both Eyes BID Mordecai Rasmussen, MD   1 drop at 04/25/22 0939   polyethylene glycol (MIRALAX / GLYCOLAX) packet 17 g  17 g Oral Daily PRN Mordecai Rasmussen, MD       pravastatin (PRAVACHOL) tablet 80 mg  80 mg Oral QHS Mordecai Rasmussen, MD   80 mg at 04/23/22 2145   protein supplement (ENSURE MAX) liquid  11 oz Oral Daily Mordecai Rasmussen, MD   11 oz at 04/24/22  0816   traZODone (DESYREL) tablet 50 mg  50 mg Oral QHS Mordecai Rasmussen, MD   50 mg at 04/23/22 2145   vitamin B-12 (CYANOCOBALAMIN) tablet 500 mcg  500 mcg Oral Daily Mordecai Rasmussen, MD   500 mcg at 04/25/22 7673     Discharge Medications: Please see discharge summary for a list of discharge medications.  Relevant Imaging Results:  Relevant Lab Results:   Additional Information SSN: [id number] [place]

## 2022-04-25 NOTE — TOC Progression Note (Signed)
Transition of Care [facility]) - Progression Note    Patient Details  Name: Kristy [name] MRN: [id number] Date of Birth: [birth year]  Transition of Care [facility]) CM/SW [place], [place] Phone Number: 04/25/2022, 11:41 AM  Clinical Narrative:    CSW completed Fl2. TOC starting insurance auth for pt. Plan will be for [facility] at D/C. TOC to follow.   Expected Discharge Plan: [place] Barriers to Discharge: Continued Medical Work up  Expected Discharge Plan and Services Expected Discharge Plan: [place] In-house Referral: Clinical Social Work Discharge Planning Services: CM Consult Post Acute Care Choice: [place] arrangements for the past 2 months: [place]                                       Social Determinants of Health (SDOH) Interventions    Readmission Risk Interventions     No data to display

## 2022-04-25 NOTE — Progress Notes (Signed)
PROGRESS NOTE    Kristy [name]  TDD:[id number] DOB: 08-07-1928 DOA: 04/22/2022 PCP: System, Provider Not In   Brief Narrative:    Kristy [name] is a 88 y.o. female with medical history significant of dementia with behavioral disorder, hypertension, hyperlipidemia, prior history of a stroke/TIA, type 2 diabetes mellitus and macular degeneration/increase intraocular pressure; who presented from the skilled nursing facility after unwitnessed mechanical fall.  She was noted to have a closed fracture of her proximal femur and underwent surgical repair 7/25.  She was noted to have acute anemia and underwent 2 unit PRBC transfusion on 7/26 with appropriate response.  She was also noted to be iron deficient and received IV Feraheme.  She is FOBT positive and GI evaluation pending.  Assessment & Plan:   Principal Problem:   Closed fracture proximal femur, transepiphyseal, left, initial encounter ([place]) Active Problems:   Essential hypertension   Type 2 diabetes with nephropathy (HCC)   Dementia with behavioral disturbance (HCC)   Dyslipidemia   Chronic kidney disease, stage 3a (HCC)   H/O TIA (transient ischemic attack) and stroke   Increased intraocular pressure  Assessment and Plan:  Closed fracture proximal femur, transepiphyseal, left, initial encounter ([place]) -In the setting of mechanical fall -Stable vitamin D level appreciated for her age. -Patient is scheduled for surgical repair on 04/23/2022; will follow orthopedic service postoperative recommendations. -Continue as needed analgesics/muscle relaxant -PT/OT after surgical intervention following hip/femur fracture protocol. -SCDs and aspirin for DVT prophylaxis-81 twice daily currently held due to anemia with stool occult positive   Acute anemia -Likely postoperative -2 unit PRBC transfusion 7/26 -Noted to have iron deficiency and will receive Feraheme -Follow CBC -Noted to have FOBT positive, GI consulted for further  evaluation   Mild hyperkalemia -Resolved, monitor   Increased intraocular pressure -Continue treatment with Lumigan and Pilocarpine sol -Patient also with history of macular degeneration; continue outpatient follow-up with ophthalmology service.   H/O TIA (transient ischemic attack) and stroke -No new focal deficits appreciated -Continue aspirin and statin; continue risk factor modification.   Chronic kidney disease, stage 3a ([place]) -In the setting of diabetes -Renal function stable, well controlled and at baseline currently -Continue to maintain adequate hydration, minimize nephrotoxic agents, hypotension and the use of contrast. -Continue to follow-up renal function trend.   Dyslipidemia -Continue heart healthy diet, Pravachol and the use of zetia   Dementia with behavioral disturbance (HCC) -Overall stable mood -Continue constant reorientation and supportive care -Continue the use of BuSpar, trazodone and Lexapro.   Type 2 diabetes with nephropathy (HCC) -Continue holding oral hypoglycemic agents while inpatient -Continue sliding scale insulin and follow CBGs to further adjust hypoglycemic regimen as required. -Continue to minimize nephrotoxic agents, maintain adequate hydration and follow renal function trend.   Essential hypertension -Overall stable and well-controlled -Continue the use of metoprolol and follow vital signs.     DVT prophylaxis: SCDs Code Status: DNR Family Communication: None at bedside Disposition Plan:  Status is: Inpatient Remains inpatient appropriate because: Need for IV medications, transfusion   Consultants:  Orthopedics GI   Procedures:  IM nail to left hip 7/25   Antimicrobials:  Anti-infectives (From admission, onward)    Start     Dose/Rate Route Frequency Ordered Stop   04/24/22 2200  ceFAZolin (ANCEF) IVPB 2g/100 mL premix        2 g 200 mL/hr over 30 Minutes Intravenous Every 8 hours 04/24/22 2106 04/25/22 2159   04/23/22  1200  ceFAZolin (ANCEF) IVPB 2g/100  mL premix        2 g 200 mL/hr over 30 Minutes Intravenous On call to O.R. 04/22/22 1100 04/23/22 1242       Subjective: Patient seen and evaluated today with no new acute complaints or concerns. No acute concerns or events noted overnight.  Objective: Vitals:   04/24/22 1141 04/24/22 1420 04/24/22 2058 04/25/22 0453  BP: (!) 105/45 (!) 124/52 134/70 140/65  Pulse: (!) 108 90 89 100  Resp: 18 (!) 24 (!) 24 (!) 22  Temp: (!) 97.4 F (36.3 C) 99 F (37.2 C) 98.7 F (37.1 C) 98.7 F (37.1 C)  TempSrc: Oral Oral Oral   SpO2: 95% 94%  98%  Weight:      Height:        Intake/Output Summary (Last 24 hours) at 04/25/2022 1104 Last data filed at 04/25/2022 0600 Gross per 24 hour  Intake 1332.66 ml  Output 1100 ml  Net 232.66 ml   Filed Weights   04/22/22 1258 04/22/22 1307  Weight: 74.3 kg 74.3 kg    Examination:  General exam: Appears calm and comfortable  Respiratory system: Clear to auscultation. Respiratory effort normal. Cardiovascular system: S1 & S2 heard, RRR.  Gastrointestinal system: Abdomen is soft Central nervous system: Alert and awake Extremities: No edema, left hip incision clean dry and intact Skin: No significant lesions noted Psychiatry: Flat affect.    Data Reviewed: I have personally reviewed following labs and imaging studies  CBC: Recent Labs  Lab 04/22/22 0953 04/23/22 0427 04/24/22 0517 04/24/22 0727 04/24/22 1624 04/25/22 0456  WBC 18.3* 20.5* 19.9*  --   --  22.6*  NEUTROABS 11.7*  --   --   --   --   --   HGB 10.2* 9.9* 6.3* 6.3* 10.0* 10.0*  HCT 30.8* 30.2* 19.4* 19.0* 29.0* 30.2*  MCV 93.3 94.7 94.2  --   --  92.1  PLT 163 212 197  --   --  161*   Basic Metabolic Panel: Recent Labs  Lab 04/22/22 0953 04/23/22 0427 04/24/22 0517 04/24/22 1138 04/25/22 0456  NA 132* 135 135  --  139  K 4.5 4.6 5.2* 5.0 4.3  CL 104 106 108  --  109  CO2 22 21* 18*  --  22  GLUCOSE 183* 230* 309* 427*  164*  BUN 24* 29* 44*  --  50*  CREATININE 1.08* 1.16* 1.63*  --  1.47*  CALCIUM 8.9 8.9 8.2*  --  8.6*  MG  --   --   --   --  2.0   GFR: Estimated Creatinine Clearance: 25.1 mL/min (A) (by C-G formula based on SCr of 1.47 mg/dL (H)). Liver Function Tests: No results for input(s): "AST", "ALT", "ALKPHOS", "BILITOT", "PROT", "ALBUMIN" in the last 168 hours. No results for input(s): "LIPASE", "AMYLASE" in the last 168 hours. No results for input(s): "AMMONIA" in the last 168 hours. Coagulation Profile: Recent Labs  Lab 04/22/22 0953  INR 1.1   Cardiac Enzymes: No results for input(s): "CKTOTAL", "CKMB", "CKMBINDEX", "TROPONINI" in the last 168 hours. BNP (last 3 results) No results for input(s): "PROBNP" in the last 8760 hours. HbA1C: Recent Labs    04/24/22 0933  HGBA1C 6.2*   CBG: Recent Labs  Lab 04/24/22 1652 04/24/22 2058 04/25/22 0005 04/25/22 0454 04/25/22 0715  GLUCAP 275* 124* 97 170* 181*   Lipid Profile: No results for input(s): "CHOL", "HDL", "LDLCALC", "TRIG", "CHOLHDL", "LDLDIRECT" in the last 72 hours. Thyroid Function Tests: No  results for input(s): "TSH", "T4TOTAL", "FREET4", "T3FREE", "THYROIDAB" in the last 72 hours. Anemia Panel: Recent Labs    04/24/22 0517  VITAMINB12 1,053*  FOLATE 6.6  FERRITIN 184  TIBC 233*  IRON 16*  RETICCTPCT 4.4*   Sepsis Labs: No results for input(s): "PROCALCITON", "LATICACIDVEN" in the last 168 hours.  Recent Results (from the past 240 hour(s))  Surgical PCR screen     Status: None   Collection Time: 04/23/22  4:40 AM   Specimen: Nasal Mucosa; Nasal Swab  Result Value Ref Range Status   MRSA, PCR NEGATIVE NEGATIVE Final   Staphylococcus aureus NEGATIVE NEGATIVE Final    Comment: (NOTE) The Xpert SA Assay (FDA approved for NASAL specimens in patients 40 years of age and older), is one component of a comprehensive surveillance program. It is not intended to diagnose infection nor to guide or monitor  treatment. Performed at [facility], [address], [place], [place] [address]          Radiology Studies: DG FEMUR MIN 2 VIEWS LEFT  Result Date: 04/23/2022 CLINICAL DATA:  Fracture left femur EXAM: LEFT FEMUR 2 VIEWS COMPARISON:  04/22/2022 FINDINGS: There is interval reduction and internal fixation of displaced fracture of proximal shaft of left femur with intramedullary rod. There is a large caliber screw in the neck of the femur. There are pockets of air in the soft tissues. Skin staples are noted. There is previous left knee arthroplasty. Arterial calcifications are seen in soft tissues. IMPRESSION: Status post reduction and internal fixation of fracture of proximal left femur. Electronically Signed   By: Elmer Picker M.D.   On: 04/23/2022 16:21   DG HIP UNILAT WITH PELVIS 2-3 VIEWS LEFT  Result Date: 04/23/2022 CLINICAL DATA:  Left femur ORIF EXAM: DG HIP (WITH OR WITHOUT PELVIS) 2-3V LEFT COMPARISON:  04/22/2022 FINDINGS: Ten C-arm fluoroscopic images were obtained intraoperatively and submitted for post operative interpretation. Images obtained during IM rod and screw fixation traversing proximal left femur fracture. 4 minutes 20 seconds fluoroscopy time utilized. Radiation dose: 146.19 mGy. Please see the performing provider's procedural report for further detail. IMPRESSION: Intraoperative ORIF proximal left femur fracture. Electronically Signed   By: Davina Poke D.O.   On: 04/23/2022 15:26   DG C-Arm 1-60 Min-No Report  Result Date: 04/23/2022 Fluoroscopy was utilized by the requesting physician.  No radiographic interpretation.   DG C-Arm 1-60 Min-No Report  Result Date: 04/23/2022 Fluoroscopy was utilized by the requesting physician.  No radiographic interpretation.        Scheduled Meds:  busPIRone  30 mg Oral QHS   Chlorhexidine Gluconate Cloth  6 each Topical Daily   cholecalciferol  1,000 Units Oral Daily   escitalopram  20 mg Oral Daily   ezetimibe   10 mg Oral Daily   insulin aspart  0-20 Units Subcutaneous Q4H   latanoprost  1 drop Both Eyes QHS   metoprolol succinate  25 mg Oral Daily   mupirocin ointment  1 Application Nasal BID   pantoprazole (PROTONIX) IV  40 mg Intravenous Q24H   pilocarpine  1 drop Both Eyes BID   pravastatin  80 mg Oral QHS   Ensure Max Protein  11 oz Oral Daily   traZODone  50 mg Oral QHS   cyanocobalamin  500 mcg Oral Daily   Continuous Infusions:   ceFAZolin (ANCEF) IV 2 g (04/25/22 0510)   ferric gluconate (FERRLECIT) IVPB 250 mg (04/25/22 0951)   lactated ringers 50 mL/hr at 04/24/22 1904  methocarbamol (ROBAXIN) IV       LOS: 3 days    Time spent: 35 minutes    Namiyah Grantham Darleen Crocker, DO [facility]  If 7PM-7AM, please contact night-coverage [email or web address] 04/25/2022, 11:04 AM

## 2022-04-25 NOTE — Telephone Encounter (Signed)
Per Dr. Jenetta Downer, please arrange for hospital follow up in 3-4 weeks for anemia/heme + stool.

## 2022-04-25 NOTE — Progress Notes (Addendum)
   ORTHOPAEDIC PROGRESS NOTE  S/p Left Hip Cephalomedullary nail for subtrochanteric femur fracture   DOS: 04/23/22  SUBJECTIVE: Not urinating on her own, consistently.  She received a blood transfusion yesterday, with an appropriate response.  She has been combative, but is doing better this morning.  Her son is at the bedside.  OBJECTIVE: PE:  Resting.   SCDs in place Does not answer questions appropriately.  Dressings intact.  SS drainage on small dressing.  Drainage has dried, compared to yesterday.  No active bleeding is appreciated. Toes are WWP 2+ DP pulse  Vitals:   04/24/22 2058 04/25/22 0453  BP: 134/70 140/65  Pulse: 89 100  Resp: (!) 24 (!) 22  Temp: 98.7 F (37.1 C) 98.7 F (37.1 C)  SpO2:  98%      Latest Ref Rng & Units 04/25/2022    4:56 AM 04/24/2022    4:24 PM 04/24/2022    7:27 AM  CBC  WBC 4.0 - 10.5 K/uL 22.6     Hemoglobin 12.0 - 15.0 g/dL 16.1  09.6  6.3   Hematocrit 36.0 - 46.0 % 30.2  29.0  19.0   Platelets 150 - 400 K/uL 149        ASSESSMENT: Kristy [name] is a 88 y.o. female stable, hemoglobin is better.  Appropriate response following transfusion.  Continue with PT and OT, disposition pending.  PLAN: Weightbearing: WBAT LLE Insicional and dressing care: Reinforce dressings as needed Orthopedic device(s): None VTE prophylaxis: Recommend Aspirin 81mg  BID; SCDs BLE Pain control: PO pain medications as needed; judicious use of narcotics Follow - up plan: approximately 2 weeks postop for incision check and XR.  If the patient will be returning to a nursing facility, staples can be removed around this time and a follow up appointment can be scheduled for 6 weeks after surgery.  Contact information:     Archit Leger A. Dallas Schimke, MD MS [facility] [address] [place],  [place]  [address] Phone: [phone] Fax: [phone]

## 2022-04-25 NOTE — Evaluation (Signed)
Physical Therapy Evaluation Patient Details Name: Kristy [name] MRN: [id number] DOB: [birth year] Today's Date: 04/25/2022  History of Present Illness  Kristy [name] is a 88 y.o. female with medical history significant of dementia with behavioral disorder, hypertension, hyperlipidemia, prior history of a stroke/TIA, type 2 diabetes mellitus and macular degeneration/increase intraocular pressure; who presented from the skilled nursing facility after unwitnessed mechanical fall with complains of left leg pain and inability to bear weight after that.     No fever, no chest pain, no nausea, no vomiting, no sick contacts or complaints of dysuria/hematuria, melena or hematochezia; but information may be inaccurate given patient underlying dementia and lack of credibility with info     In the ED work-up demonstrating displays fracture of left femur with physical signs of tenderness and deformity.  Case discussed with orthopedic surgery who felt patient will need surgical repair and the plan is for intervention on 04/23/2022.  TRH consulted to help with admission, evaluation and medical management. S/p Cephalomedullary nail for Left subtrochanteric femur fracture. (per MD).   Clinical Impression  Pt lethargic and wearing mittens upon entrance to room. Pt noted to be difficult to understand. Eyes were closed much of session. Unsure of pt's baseline mobility. Max A needed with signs of increased pain in L LE with bed mobility to sit at EOB. Poor sitting balance with R lateral lean. Pt tolerating sitting at EOB for less than a minute prior to seeking to return to supine. Pt left in bed with bed alarm set and mittens donned.  Patient would continue to benefit from skilled physical therapy in current environment and next venue to continue return to prior function and increase strength, endurance, balance, coordination, and functional mobility and gait skills.         Recommendations for follow up therapy are one  component of a multi-disciplinary discharge planning process, led by the attending physician.  Recommendations may be updated based on patient status, additional functional criteria and insurance authorization.  Follow Up Recommendations Skilled nursing-short term rehab (<3 hours/day)      Assistance Recommended at Discharge Frequent or constant Supervision/Assistance  Patient can return home with the following  Help with stairs or ramp for entrance;Assist for transportation;Assistance with cooking/housework;A lot of help with walking and/or transfers;A lot of help with bathing/dressing/bathroom    Equipment Recommendations None recommended by PT  Recommendations for Other Services       Functional Status Assessment Patient has had a recent decline in their functional status and demonstrates the ability to make significant improvements in function in a reasonable and predictable amount of time.     Precautions / Restrictions Precautions Precautions: Fall Precaution Comments: s/p Cephalomedullary nail for Left subtrochanteric femur fracture Restrictions Weight Bearing Restrictions: Yes LLE Weight Bearing: Weight bearing as tolerated      Mobility  Bed Mobility Overal bed mobility: Needs Assistance Bed Mobility: Supine to Sit, Sit to Supine     Supine to sit: Max assist, HOB elevated Sit to supine: Max assist   General bed mobility comments: Pt lethargic and needing max A for bed mobility; signs of increased pain with movement.    Transfers    Ambulation/Gait      Stairs      Wheelchair Mobility    Modified Rankin (Stroke Patients Only)       Balance Overall balance assessment: Needs assistance Sitting-balance support: Feet supported, Bilateral upper extremity supported Sitting balance-Leahy Scale: Poor Sitting balance - Comments: seated EOB Postural control:  Right lateral lean         Pertinent Vitals/Pain Pain Assessment Pain Assessment:  Faces Faces Pain Scale: Hurts whole lot Pain Location: left hip Pain Descriptors / Indicators: Grimacing, Guarding, Moaning Pain Intervention(s): Limited activity within patient's tolerance, Monitored during session, Repositioned    Home Living Family/patient expects to be discharged to:: Skilled nursing facility     Prior Function Prior Level of Function : Needs assist     Mobility Comments: From SNF unsure of prior level of mobility. ADLs Comments: Assisted by SNF staff.     Hand Dominance        Extremity/Trunk Assessment   Upper Extremity Assessment Upper Extremity Assessment: Defer to OT evaluation LUE Deficits / Details: 2-/5 active shoulder flexion noted. Limited to less than 50% P/ROM but unsure of pt resistances versus available range.    Lower Extremity Assessment Lower Extremity Assessment: Generalized weakness;LLE deficits/detail LLE Deficits / Details: left hip LLE: Unable to fully assess due to pain LLE Coordination: decreased fine motor;decreased gross motor       Communication   Communication: Expressive difficulties;HOH  Cognition Arousal/Alertness: Awake/alert, Lethargic Behavior During Therapy: Impulsive Overall Cognitive Status: History of cognitive impairments - at baseline        General Comments      Exercises     Assessment/Plan    PT Assessment Patient needs continued PT services  PT Problem List Decreased strength;Decreased mobility;Decreased safety awareness;Decreased activity tolerance;Decreased cognition;Decreased balance;Decreased knowledge of use of DME;Pain;Decreased coordination       PT Treatment Interventions DME instruction;Therapeutic exercise;Gait training;Balance training;Neuromuscular re-education;Therapeutic activities;Patient/family education;Functional mobility training    PT Goals (Current goals can be found in the Care Plan section)  Acute Rehab PT Goals Patient Stated Goal: Unable to participate Time For Goal  Achievement: 05/09/22 Potential to Achieve Goals: Fair    Frequency Min 3X/week        AM-PAC PT "6 Clicks" Mobility  Outcome Measure Help needed turning from your back to your side while in a flat bed without using bedrails?: A Lot Help needed moving from lying on your back to sitting on the side of a flat bed without using bedrails?: A Lot Help needed moving to and from a bed to a chair (including a wheelchair)?: Total Help needed standing up from a chair using your arms (e.g., wheelchair or bedside chair)?: Total Help needed to walk in hospital room?: Total Help needed climbing 3-5 steps with a railing? : Total 6 Click Score: 8    End of Session   Activity Tolerance: Patient limited by fatigue;Patient limited by lethargy Patient left: in bed Nurse Communication: Mobility status PT Visit Diagnosis: Unsteadiness on feet (R26.81);Other abnormalities of gait and mobility (R26.89);Muscle weakness (generalized) (M62.81)    Time: 6811-5726 PT Time Calculation (min) (ACUTE ONLY): 20 min   Charges:   PT Evaluation $PT Eval Low Complexity: 1 Low          Salem Lembke D. Hartnett-Rands, MS, PT Per [place] [phone]  Pamala Hurry  Hartnett-Rands 04/25/2022, 11:11 AM

## 2022-04-26 DIAGNOSIS — K5732 Diverticulitis of large intestine without perforation or abscess without bleeding: Secondary | ICD-10-CM | POA: Diagnosis not present

## 2022-04-26 DIAGNOSIS — K625 Hemorrhage of anus and rectum: Secondary | ICD-10-CM | POA: Diagnosis not present

## 2022-04-26 DIAGNOSIS — F32A Depression, unspecified: Secondary | ICD-10-CM | POA: Diagnosis not present

## 2022-04-26 DIAGNOSIS — H401133 Primary open-angle glaucoma, bilateral, severe stage: Secondary | ICD-10-CM | POA: Diagnosis not present

## 2022-04-26 DIAGNOSIS — E785 Hyperlipidemia, unspecified: Secondary | ICD-10-CM | POA: Diagnosis not present

## 2022-04-26 DIAGNOSIS — I959 Hypotension, unspecified: Secondary | ICD-10-CM | POA: Diagnosis not present

## 2022-04-26 DIAGNOSIS — D509 Iron deficiency anemia, unspecified: Secondary | ICD-10-CM | POA: Diagnosis not present

## 2022-04-26 DIAGNOSIS — K6289 Other specified diseases of anus and rectum: Secondary | ICD-10-CM | POA: Diagnosis not present

## 2022-04-26 DIAGNOSIS — Z90710 Acquired absence of both cervix and uterus: Secondary | ICD-10-CM | POA: Diagnosis not present

## 2022-04-26 DIAGNOSIS — R52 Pain, unspecified: Secondary | ICD-10-CM | POA: Diagnosis not present

## 2022-04-26 DIAGNOSIS — Z8673 Personal history of transient ischemic attack (TIA), and cerebral infarction without residual deficits: Secondary | ICD-10-CM | POA: Diagnosis not present

## 2022-04-26 DIAGNOSIS — G4700 Insomnia, unspecified: Secondary | ICD-10-CM | POA: Diagnosis not present

## 2022-04-26 DIAGNOSIS — H40059 Ocular hypertension, unspecified eye: Secondary | ICD-10-CM | POA: Diagnosis not present

## 2022-04-26 DIAGNOSIS — N1831 Chronic kidney disease, stage 3a: Secondary | ICD-10-CM | POA: Diagnosis not present

## 2022-04-26 DIAGNOSIS — Z960 Presence of urogenital implants: Secondary | ICD-10-CM | POA: Diagnosis not present

## 2022-04-26 DIAGNOSIS — Z7401 Bed confinement status: Secondary | ICD-10-CM | POA: Diagnosis not present

## 2022-04-26 DIAGNOSIS — E1122 Type 2 diabetes mellitus with diabetic chronic kidney disease: Secondary | ICD-10-CM | POA: Diagnosis not present

## 2022-04-26 DIAGNOSIS — S7292XD Unspecified fracture of left femur, subsequent encounter for closed fracture with routine healing: Secondary | ICD-10-CM | POA: Diagnosis not present

## 2022-04-26 DIAGNOSIS — K5730 Diverticulosis of large intestine without perforation or abscess without bleeding: Secondary | ICD-10-CM | POA: Diagnosis not present

## 2022-04-26 DIAGNOSIS — Z9049 Acquired absence of other specified parts of digestive tract: Secondary | ICD-10-CM | POA: Diagnosis not present

## 2022-04-26 DIAGNOSIS — Z8619 Personal history of other infectious and parasitic diseases: Secondary | ICD-10-CM | POA: Diagnosis not present

## 2022-04-26 DIAGNOSIS — Z66 Do not resuscitate: Secondary | ICD-10-CM | POA: Diagnosis not present

## 2022-04-26 DIAGNOSIS — S72022A Displaced fracture of epiphysis (separation) (upper) of left femur, initial encounter for closed fracture: Secondary | ICD-10-CM | POA: Diagnosis not present

## 2022-04-26 DIAGNOSIS — F03918 Unspecified dementia, unspecified severity, with other behavioral disturbance: Secondary | ICD-10-CM | POA: Diagnosis not present

## 2022-04-26 DIAGNOSIS — D519 Vitamin B12 deficiency anemia, unspecified: Secondary | ICD-10-CM | POA: Diagnosis not present

## 2022-04-26 DIAGNOSIS — R739 Hyperglycemia, unspecified: Secondary | ICD-10-CM | POA: Diagnosis not present

## 2022-04-26 DIAGNOSIS — H9190 Unspecified hearing loss, unspecified ear: Secondary | ICD-10-CM | POA: Diagnosis not present

## 2022-04-26 DIAGNOSIS — E7800 Pure hypercholesterolemia, unspecified: Secondary | ICD-10-CM | POA: Diagnosis not present

## 2022-04-26 DIAGNOSIS — F03C18 Unspecified dementia, severe, with other behavioral disturbance: Secondary | ICD-10-CM | POA: Diagnosis not present

## 2022-04-26 DIAGNOSIS — I129 Hypertensive chronic kidney disease with stage 1 through stage 4 chronic kidney disease, or unspecified chronic kidney disease: Secondary | ICD-10-CM | POA: Diagnosis not present

## 2022-04-26 DIAGNOSIS — Z96653 Presence of artificial knee joint, bilateral: Secondary | ICD-10-CM | POA: Diagnosis not present

## 2022-04-26 DIAGNOSIS — E559 Vitamin D deficiency, unspecified: Secondary | ICD-10-CM | POA: Diagnosis not present

## 2022-04-26 DIAGNOSIS — K449 Diaphragmatic hernia without obstruction or gangrene: Secondary | ICD-10-CM | POA: Diagnosis not present

## 2022-04-26 DIAGNOSIS — Z8249 Family history of ischemic heart disease and other diseases of the circulatory system: Secondary | ICD-10-CM | POA: Diagnosis not present

## 2022-04-26 DIAGNOSIS — Z96642 Presence of left artificial hip joint: Secondary | ICD-10-CM | POA: Diagnosis not present

## 2022-04-26 DIAGNOSIS — D62 Acute posthemorrhagic anemia: Secondary | ICD-10-CM | POA: Diagnosis not present

## 2022-04-26 DIAGNOSIS — Z515 Encounter for palliative care: Secondary | ICD-10-CM | POA: Diagnosis not present

## 2022-04-26 DIAGNOSIS — Z823 Family history of stroke: Secondary | ICD-10-CM | POA: Diagnosis not present

## 2022-04-26 DIAGNOSIS — Z743 Need for continuous supervision: Secondary | ICD-10-CM | POA: Diagnosis not present

## 2022-04-26 DIAGNOSIS — R9431 Abnormal electrocardiogram [ECG] [EKG]: Secondary | ICD-10-CM | POA: Diagnosis not present

## 2022-04-26 DIAGNOSIS — H353114 Nonexudative age-related macular degeneration, right eye, advanced atrophic with subfoveal involvement: Secondary | ICD-10-CM | POA: Diagnosis not present

## 2022-04-26 DIAGNOSIS — R279 Unspecified lack of coordination: Secondary | ICD-10-CM | POA: Diagnosis not present

## 2022-04-26 DIAGNOSIS — I10 Essential (primary) hypertension: Secondary | ICD-10-CM | POA: Diagnosis not present

## 2022-04-26 DIAGNOSIS — R58 Hemorrhage, not elsewhere classified: Secondary | ICD-10-CM | POA: Diagnosis not present

## 2022-04-26 DIAGNOSIS — Z974 Presence of external hearing-aid: Secondary | ICD-10-CM | POA: Diagnosis not present

## 2022-04-26 DIAGNOSIS — Z86010 Personal history of colonic polyps: Secondary | ICD-10-CM | POA: Diagnosis not present

## 2022-04-26 LAB — GLUCOSE, CAPILLARY
Glucose-Capillary: 124 mg/dL — ABNORMAL HIGH (ref 70–99)
Glucose-Capillary: 144 mg/dL — ABNORMAL HIGH (ref 70–99)
Glucose-Capillary: 149 mg/dL — ABNORMAL HIGH (ref 70–99)
Glucose-Capillary: 150 mg/dL — ABNORMAL HIGH (ref 70–99)
Glucose-Capillary: 153 mg/dL — ABNORMAL HIGH (ref 70–99)
Glucose-Capillary: 174 mg/dL — ABNORMAL HIGH (ref 70–99)

## 2022-04-26 LAB — CBC
HCT: 29.4 % — ABNORMAL LOW (ref 36.0–46.0)
Hemoglobin: 9.6 g/dL — ABNORMAL LOW (ref 12.0–15.0)
MCH: 30.8 pg (ref 26.0–34.0)
MCHC: 32.7 g/dL (ref 30.0–36.0)
MCV: 94.2 fL (ref 80.0–100.0)
Platelets: 146 10*3/uL — ABNORMAL LOW (ref 150–400)
RBC: 3.12 MIL/uL — ABNORMAL LOW (ref 3.87–5.11)
RDW: 15 % (ref 11.5–15.5)
WBC: 22.5 10*3/uL — ABNORMAL HIGH (ref 4.0–10.5)
nRBC: 0.4 % — ABNORMAL HIGH (ref 0.0–0.2)

## 2022-04-26 LAB — BASIC METABOLIC PANEL
Anion gap: 6 (ref 5–15)
BUN: 40 mg/dL — ABNORMAL HIGH (ref 8–23)
CO2: 23 mmol/L (ref 22–32)
Calcium: 8.6 mg/dL — ABNORMAL LOW (ref 8.9–10.3)
Chloride: 110 mmol/L (ref 98–111)
Creatinine, Ser: 1.16 mg/dL — ABNORMAL HIGH (ref 0.44–1.00)
GFR, Estimated: 45 mL/min — ABNORMAL LOW (ref >60)
Glucose, Bld: 155 mg/dL — ABNORMAL HIGH (ref 70–99)
Potassium: 4.8 mmol/L (ref 3.5–5.1)
Sodium: 139 mmol/L (ref 135–145)

## 2022-04-26 LAB — MAGNESIUM: Magnesium: 2 mg/dL (ref 1.7–2.4)

## 2022-04-26 MED ORDER — PANTOPRAZOLE SODIUM 40 MG PO TBEC: 40.0000 mg | DELAYED_RELEASE_TABLET | Freq: Two times a day (BID) | ORAL | 0 refills | Status: DC

## 2022-04-26 MED ORDER — FERROUS SULFATE 325 (65 FE) MG PO TABS: 325.0000 mg | ORAL_TABLET | Freq: Two times a day (BID) | ORAL | 3 refills | Status: DC

## 2022-04-26 MED ORDER — ASPIRIN 81 MG PO TBEC: 81.0000 mg | DELAYED_RELEASE_TABLET | Freq: Two times a day (BID) | ORAL | 0 refills | Status: DC

## 2022-04-26 MED ORDER — HYDROCODONE-ACETAMINOPHEN 5-325 MG PO TABS: 1.0000 | ORAL_TABLET | Freq: Four times a day (QID) | ORAL | 0 refills | Status: DC | PRN

## 2022-04-26 NOTE — TOC Transition Note (Signed)
Transition of Care [facility]) - CM/SW Discharge Note   Patient Details  Name: Kristy [name] MRN: [id number] Date of Birth: [birth year]  [facility] CM/SW Contact:  Iona Beard, [place] Phone Number: 04/26/2022, 12:37 PM   Clinical Narrative:    CSW updated that pt is medically ready for D/C to SNF. CSW updated that [employer] approved insurance auth. CSW updated Melissa in admission who states pt can return. CSW updated RN of numbers for report and room. CSW updated pts son Raffaella Edison of plan for D/C back to [facility]. CSW to complete med necessity and send to the floor. EMS has been called. TOC signing off.   Final next level of care: Skilled Nursing Facility Barriers to Discharge: Barriers Resolved   Patient Goals and CMS Choice Patient states their goals for this hospitalization and ongoing recovery are:: return to SNF CMS Medicare.gov Compare Post Acute Care list provided to:: Patient Choice offered to / list presented to : Patient  Discharge Placement              Patient chooses bed at: [facility] Patient to be transferred to facility by: EMS Name of family member notified: Eliana Lueth Patient and family notified of of transfer: 04/26/22  Discharge Plan and Services In-house Referral: Clinical Social Work Discharge Planning Services: CM Consult Post Acute Care Choice: [place]                               Social Determinants of Health (SDOH) Interventions     Readmission Risk Interventions     No data to display

## 2022-04-26 NOTE — Discharge Summary (Signed)
Physician Discharge Summary  Kristy [name] XTK:[id number] DOB: 1928/01/25 DOA: 04/22/2022  PCP: System, Provider Not In  Admit date: 04/22/2022  Discharge date: 04/26/2022  Admitted From:SNF   Disposition:  SNF  Recommendations for Outpatient Follow-up:  Follow up with PCP in 1-2 weeks Follow-up with orthopedics as noted in the next 2 weeks for postop incision check and x-ray Remove staples by 8/8 which would be 2 weeks postoperatively Continue aspirin 81 mg twice daily as prescribed for DVT prophylaxis Continue PPI twice daily and iron supplementation twice daily per GI recommendations due to iron deficiency anemia with positive stool occult Follow-up with GI which was scheduled in 4-6 weeks for outpatient endoscopy Continue other home medications as prior Pain management with hydrocodone prescription provided with 10 tablets and 0 refills  Home Health: None  Equipment/Devices: None  Discharge Condition:Stable  CODE STATUS: DNR  Diet recommendation: Heart Healthy/carb modified  Brief/Interim Summary: Kristy [name] is a 88 y.o. female with medical history significant of dementia with behavioral disorder, hypertension, hyperlipidemia, prior history of a stroke/TIA, type 2 diabetes mellitus and macular degeneration/increase intraocular pressure; who presented from the skilled nursing facility after unwitnessed mechanical fall.  She was noted to have a closed fracture of her proximal femur and underwent surgical repair 7/25.  She was noted to have acute anemia and underwent 2 unit PRBC transfusion on 7/26 with appropriate response.  She was also noted to be iron deficient and received IV Feraheme.  She is FOBT positive and underwent GI evaluation.  After further discussion with family members, it was decided that patient would be okay to have outpatient endoscopy and therefore will be discharged with follow-up scheduled.  She has no overt bleeding noted at this time and hemoglobin levels  remained stable.  She will discharge on PPI twice daily as well as iron supplementation and may resume DVT prophylaxis with aspirin as recommended by orthopedics.  She has been seen by PT/OT and is stable to return back to SNF for rehabilitation.  No other acute events noted.  Discharge Diagnoses:  Principal Problem:   Closed fracture proximal femur, transepiphyseal, left, initial encounter [facility]) Active Problems:   Essential hypertension   Type 2 diabetes with nephropathy ([place])   Dementia with behavioral disturbance (HCC)   Dyslipidemia   Chronic kidney disease, stage 3a (HCC)   H/O TIA (transient ischemic attack) and stroke   Increased intraocular pressure   Heme positive stool  Principal discharge diagnosis: Closed proximal femur fracture status post mechanical fall.  Acute postoperative anemia status post 2 unit PRBC and positive stool occult.  Discharge Instructions  Discharge Instructions     Ambulatory referral to Orthopedic Surgery   Complete by: As directed    Diet - low sodium heart healthy   Complete by: As directed    If the dressing is still on your incision site when you go home, remove it on the third day after your surgery date. Remove dressing if it begins to fall off, or if it is dirty or damaged before the third day.   Complete by: As directed    Increase activity slowly   Complete by: As directed       Allergies as of 04/26/2022       Reactions   Vioxx [rofecoxib] Other (See Comments)   Ulcers in mouth        Medication List     STOP taking these medications    lisinopril 10 MG tablet Commonly known as: ZESTRIL  TAKE these medications    aspirin EC 81 MG tablet Take 1 tablet (81 mg total) by mouth in the morning and at bedtime. Swallow whole.   busPIRone 30 MG tablet Commonly known as: BUSPAR Take 1 tablet (30 mg total) by mouth at bedtime. What changed: when to take this   cholecalciferol 1000 units tablet Commonly known as:  VITAMIN D Take 1,000 Units by mouth daily.   escitalopram 20 MG tablet Commonly known as: LEXAPRO Take 20 mg by mouth daily.   ezetimibe 10 MG tablet Commonly known as: ZETIA Take 1 tablet (10 mg total) by mouth daily.   ferrous sulfate 325 (65 FE) MG tablet Take 1 tablet (325 mg total) by mouth 2 (two) times daily with a meal.   glimepiride 4 MG tablet Commonly known as: AMARYL TAKE 1 TABLET BY MOUTH DAILY WITH BREAKFAST What changed: how much to take   glucose blood test strip Commonly known as: FREESTYLE TEST STRIPS Check Blood sugar two times daily   glucose blood test strip Commonly known as: ONE TOUCH ULTRA TEST CHECK BLOOD SUGAR TWO TIMES DAILY   guaiFENesin 100 MG/5ML liquid Commonly known as: ROBITUSSIN Take 5 mLs by mouth every 4 (four) hours as needed for cough or to loosen phlegm.   HYDROcodone-acetaminophen 5-325 MG tablet Commonly known as: NORCO/VICODIN Take 1-2 tablets by mouth every 6 (six) hours as needed for moderate pain.   Lumigan 0.01 % Soln Generic drug: bimatoprost Place 1 drop into both eyes at bedtime.   melatonin 3 MG Tabs tablet Take 3 mg by mouth at bedtime.   metFORMIN 500 MG 24 hr tablet Commonly known as: GLUCOPHAGE-XR TAKE 2 TABLETS BY MOUTH EVERY DAY WITH BREAKFAST   metoprolol succinate 25 MG 24 hr tablet Commonly known as: TOPROL-XL Take 1 tablet (25 mg total) by mouth daily.   onetouch ultrasoft lancets Check blood sugars twice a day   oxybutynin 5 MG tablet Commonly known as: DITROPAN Take 5 mg by mouth 2 (two) times daily.   pantoprazole 40 MG tablet Commonly known as: Protonix Take 1 tablet (40 mg total) by mouth 2 (two) times daily.   pilocarpine 1 % ophthalmic solution Commonly known as: PILOCAR Place 1 drop into both eyes 2 (two) times daily.   pravastatin 80 MG tablet Commonly known as: PRAVACHOL Take 1 tablet (80 mg total) by mouth at bedtime.   PRESERVISION AREDS 2 PO Take by mouth.   traZODone 50 MG  tablet Commonly known as: DESYREL Take 50 mg by mouth at bedtime.   vitamin B-12 500 MCG tablet Commonly known as: CYANOCOBALAMIN Take 500 mcg by mouth daily.               Discharge Care Instructions  (From admission, onward)           Start     Ordered   04/26/22 0000  If the dressing is still on your incision site when you go home, remove it on the third day after your surgery date. Remove dressing if it begins to fall off, or if it is dirty or damaged before the third day.        04/26/22 3545            Allergies  Allergen Reactions   Vioxx [Rofecoxib] Other (See Comments)    Ulcers in mouth     Consultations: Orthopedics GI   Procedures/Studies: DG FEMUR MIN 2 VIEWS LEFT  Result Date: 04/23/2022 CLINICAL DATA:  Fracture left femur  EXAM: LEFT FEMUR 2 VIEWS COMPARISON:  04/22/2022 FINDINGS: There is interval reduction and internal fixation of displaced fracture of proximal shaft of left femur with intramedullary rod. There is a large caliber screw in the neck of the femur. There are pockets of air in the soft tissues. Skin staples are noted. There is previous left knee arthroplasty. Arterial calcifications are seen in soft tissues. IMPRESSION: Status post reduction and internal fixation of fracture of proximal left femur. Electronically Signed   By: Elmer Picker M.D.   On: 04/23/2022 16:21   DG HIP UNILAT WITH PELVIS 2-3 VIEWS LEFT  Result Date: 04/23/2022 CLINICAL DATA:  Left femur ORIF EXAM: DG HIP (WITH OR WITHOUT PELVIS) 2-3V LEFT COMPARISON:  04/22/2022 FINDINGS: Ten C-arm fluoroscopic images were obtained intraoperatively and submitted for post operative interpretation. Images obtained during IM rod and screw fixation traversing proximal left femur fracture. 4 minutes 20 seconds fluoroscopy time utilized. Radiation dose: 146.19 mGy. Please see the performing provider's procedural report for further detail. IMPRESSION: Intraoperative ORIF proximal  left femur fracture. Electronically Signed   By: Davina Poke D.O.   On: 04/23/2022 15:26   DG C-Arm 1-60 Min-No Report  Result Date: 04/23/2022 Fluoroscopy was utilized by the requesting physician.  No radiographic interpretation.   DG C-Arm 1-60 Min-No Report  Result Date: 04/23/2022 Fluoroscopy was utilized by the requesting physician.  No radiographic interpretation.   CT CERVICAL SPINE WO CONTRAST  Result Date: 04/22/2022 CLINICAL DATA:  88 year old female status post fall EXAM: CT CERVICAL SPINE WITHOUT CONTRAST TECHNIQUE: Multidetector CT imaging of the cervical spine was performed without intravenous contrast. Multiplanar CT image reconstructions were also generated. RADIATION DOSE REDUCTION: This exam was performed according to the departmental dose-optimization program which includes automated exposure control, adjustment of the mA and/or kV according to patient size and/or use of iterative reconstruction technique. COMPARISON:  CT 03/19/2022 FINDINGS: Motion artifact obscures bony detail. Alignment: Rightward rotation of C1 around the dens has increased since 03/19/2022. This is favored positional although given the degree of rotation, rotary subluxation can not be excluded. Additional alignment is unchanged. Slight anterolisthesis C2 and C3. Skull base and vertebrae: No acute fracture. No primary bone lesion or focal pathologic process. Soft tissues and spinal canal: No prevertebral fluid or swelling. No visible canal hematoma. Disc levels: Multilevel disc space height loss greatest at C5-C6 and C6-C7 where it is advanced. Multilevel spondylosis with anterior and posterior osteophytes greatest at C5-C7. Advanced cervical facet arthropathy. The posterior disc osteophyte complexes at C3-C4, C4-C5, C5-C6, and C6-C7 cause encroachment upon the spinal canal. Multilevel neural foraminal narrowing secondary to uncovertebral hypertrophy and facet arthropathy. Greatest at C5-C7 where it is  advanced. Upper chest: Negative. Other: None. IMPRESSION: 1. Increased rotation of C1 about the dens compared with 03/19/2022. This is favored positional though rotary subluxation can not be excluded. This finding was called by telephone at the time of interpretation on 04/22/2022 at 11:26 am to provider [facility] , who verbally acknowledged these results. 2. Chronic cervical spondylosis is not significantly changed from 03/19/2022. Electronically Signed   By: Placido Sou M.D.   On: 04/22/2022 11:27   CT HEAD WO CONTRAST  Result Date: 04/22/2022 CLINICAL DATA:  Polytrauma, blunt EXAM: CT HEAD WITHOUT CONTRAST TECHNIQUE: Contiguous axial images were obtained from the base of the skull through the vertex without intravenous contrast. RADIATION DOSE REDUCTION: This exam was performed according to the departmental dose-optimization program which includes automated exposure control, adjustment of the mA and/or kV  according to patient size and/or use of iterative reconstruction technique. COMPARISON:  CT head June twenty, 2023. FINDINGS: Brain: Motion limited study. Similar prior left PCA territory infarct with encephalomalacia in the left occipital lobe. Similar chronic microvascular disease and atrophy. No definite acute large vascular territory infarct. No evidence of acute hemorrhage, mass lesion, midline shift, or hydrocephalus. Vascular: No obvious hyperdense vessel identified. Calcific intracranial atherosclerosis. Skull: No acute fracture. Sinuses/Orbits: Clear sinuses.  No acute orbital findings. Other: No mastoid effusions. IMPRESSION: 1. Motion limited study without evidence of acute intracranial abnormality. 2. Similar prior left PCA territory infarct. An MRI could provide more sensitive evaluation for acute infarct if clinically warranted. Electronically Signed   By: Margaretha Sheffield M.D.   On: 04/22/2022 10:49   DG FEMUR MIN 2 VIEWS LEFT  Result Date: 04/22/2022 CLINICAL DATA:  Fall with left  hip pain and deformity EXAM: LEFT FEMUR 2 VIEWS; PELVIS - 1-2 VIEW COMPARISON:  Pelvis radiographs 02/07/2015 FINDINGS: Pelvis: There is an acute displaced fracture of the proximal left femur with significant medial and posterior angulation of the distal fragment. Femoroacetabular alignment is maintained. Right hip alignment is maintained. There is no right hip fracture. The SI joints and symphysis pubis are intact. Lower lumbar spine fusion hardware is noted. Femur: No additional femur fracture is seen. Postsurgical changes reflecting right knee arthroplasty are seen without evidence of complication. IMPRESSION: Acute displaced and posteromedially angulated proximal left femur fracture. Electronically Signed   By: Valetta Mole M.D.   On: 04/22/2022 10:18   DG Pelvis 1-2 Views  Result Date: 04/22/2022 CLINICAL DATA:  Fall with left hip pain and deformity EXAM: LEFT FEMUR 2 VIEWS; PELVIS - 1-2 VIEW COMPARISON:  Pelvis radiographs 02/07/2015 FINDINGS: Pelvis: There is an acute displaced fracture of the proximal left femur with significant medial and posterior angulation of the distal fragment. Femoroacetabular alignment is maintained. Right hip alignment is maintained. There is no right hip fracture. The SI joints and symphysis pubis are intact. Lower lumbar spine fusion hardware is noted. Femur: No additional femur fracture is seen. Postsurgical changes reflecting right knee arthroplasty are seen without evidence of complication. IMPRESSION: Acute displaced and posteromedially angulated proximal left femur fracture. Electronically Signed   By: Valetta Mole M.D.   On: 04/22/2022 10:18   DG Chest 1 View  Result Date: 04/22/2022 CLINICAL DATA:  Fall with left hip pain and deformity. EXAM: CHEST  1 VIEW COMPARISON:  Chest radiograph 03/03/2019 FINDINGS: Cardiomediastinal silhouette is within normal limits. There is no focal consolidation or pulmonary edema. There is no pleural effusion or pneumothorax There is no  acute osseous abnormality. No displaced rib fracture is seen. IMPRESSION: No radiographic evidence of acute cardiopulmonary process. Electronically Signed   By: Valetta Mole M.D.   On: 04/22/2022 10:14     Discharge Exam: Vitals:   04/25/22 2020 04/26/22 0414  BP: (!) 124/54 (!) 130/58  Pulse: 82 80  Resp: (!) 24 (!) 22  Temp: 97.7 F (36.5 C) 99.2 F (37.3 C)  SpO2:     Vitals:   04/25/22 0453 04/25/22 1324 04/25/22 2020 04/26/22 0414  BP: 140/65 139/66 (!) 124/54 (!) 130/58  Pulse: 100 90 82 80  Resp: (!) 22 20 (!) 24 (!) 22  Temp: 98.7 F (37.1 C) 98.7 F (37.1 C) 97.7 F (36.5 C) 99.2 F (37.3 C)  TempSrc:  Oral    SpO2: 98% 98%    Weight:      Height:  General: Pt is alert, awake, not in acute distress Cardiovascular: RRR, S1/S2 +, no rubs, no gallops Respiratory: CTA bilaterally, no wheezing, no rhonchi Abdominal: Soft, NT, ND, bowel sounds + Extremities: no edema, no cyanosis, incision to left hip C/D/I    The results of significant diagnostics from this hospitalization (including imaging, microbiology, ancillary and laboratory) are listed below for reference.     Microbiology: Recent Results (from the past 240 hour(s))  Surgical PCR screen     Status: None   Collection Time: 04/23/22  4:40 AM   Specimen: Nasal Mucosa; Nasal Swab  Result Value Ref Range Status   MRSA, PCR NEGATIVE NEGATIVE Final   Staphylococcus aureus NEGATIVE NEGATIVE Final    Comment: (NOTE) The Xpert SA Assay (FDA approved for NASAL specimens in patients 87 years of age and older), is one component of a comprehensive surveillance program. It is not intended to diagnose infection nor to guide or monitor treatment. Performed at [facility], [address] [place], [place] [address]      Labs: BNP (last 3 results) No results for input(s): "BNP" in the last 8760 hours. Basic Metabolic Panel: Recent Labs  Lab 04/22/22 0953 04/23/22 0427 04/24/22 0517 04/24/22 1138  04/25/22 0456 04/26/22 0441  NA 132* 135 135  --  139 139  K 4.5 4.6 5.2* 5.0 4.3 4.8  CL 104 106 108  --  109 110  CO2 22 21* 18*  --  22 23  GLUCOSE 183* 230* 309* 427* 164* 155*  BUN 24* 29* 44*  --  50* 40*  CREATININE 1.08* 1.16* 1.63*  --  1.47* 1.16*  CALCIUM 8.9 8.9 8.2*  --  8.6* 8.6*  MG  --   --   --   --  2.0 2.0   Liver Function Tests: No results for input(s): "AST", "ALT", "ALKPHOS", "BILITOT", "PROT", "ALBUMIN" in the last 168 hours. No results for input(s): "LIPASE", "AMYLASE" in the last 168 hours. No results for input(s): "AMMONIA" in the last 168 hours. CBC: Recent Labs  Lab 04/22/22 0953 04/23/22 0427 04/24/22 0517 04/24/22 0727 04/24/22 1624 04/25/22 0456 04/26/22 0441  WBC 18.3* 20.5* 19.9*  --   --  22.6* 22.5*  NEUTROABS 11.7*  --   --   --   --   --   --   HGB 10.2* 9.9* 6.3* 6.3* 10.0* 10.0* 9.6*  HCT 30.8* 30.2* 19.4* 19.0* 29.0* 30.2* 29.4*  MCV 93.3 94.7 94.2  --   --  92.1 94.2  PLT 163 212 197  --   --  149* 146*   Cardiac Enzymes: No results for input(s): "CKTOTAL", "CKMB", "CKMBINDEX", "TROPONINI" in the last 168 hours. BNP: Invalid input(s): "POCBNP" CBG: Recent Labs  Lab 04/25/22 1624 04/25/22 2005 04/26/22 0014 04/26/22 0411 04/26/22 0715  GLUCAP 183* 129* 124* 153* 150*   D-Dimer No results for input(s): "DDIMER" in the last 72 hours. Hgb A1c Recent Labs    04/24/22 0933  HGBA1C 6.2*   Lipid Profile No results for input(s): "CHOL", "HDL", "LDLCALC", "TRIG", "CHOLHDL", "LDLDIRECT" in the last 72 hours. Thyroid function studies No results for input(s): "TSH", "T4TOTAL", "T3FREE", "THYROIDAB" in the last 72 hours.  Invalid input(s): "FREET3" Anemia work up Recent Labs    04/24/22 0517  VITAMINB12 1,053*  FOLATE 6.6  FERRITIN 184  TIBC 233*  IRON 16*  RETICCTPCT 4.4*   Urinalysis    Component Value Date/Time   COLORURINE YELLOW 06/30/2018 [place] (A) 03/30/2020  1642   LABSPEC 1.018  06/30/2018 1825   PHURINE 5.0 06/30/2018 1825   GLUCOSEU Negative 03/30/2020 1642   HGBUR NEGATIVE 06/30/2018 1825   BILIRUBINUR Negative 03/30/2020 1642   KETONESUR NEGATIVE 06/30/2018 1825   PROTEINUR 1+ (A) 03/30/2020 1642   PROTEINUR NEGATIVE 06/30/2018 1825   UROBILINOGEN negative 11/24/2015 1128   UROBILINOGEN 0.2 02/28/2014 0907   NITRITE Negative 03/30/2020 1642   NITRITE NEGATIVE 06/30/2018 1825   LEUKOCYTESUR Negative 03/30/2020 1642   Sepsis Labs Recent Labs  Lab 04/23/22 0427 04/24/22 0517 04/25/22 0456 04/26/22 0441  WBC 20.5* 19.9* 22.6* 22.5*   Microbiology Recent Results (from the past 240 hour(s))  Surgical PCR screen     Status: None   Collection Time: 04/23/22  4:40 AM   Specimen: Nasal Mucosa; Nasal Swab  Result Value Ref Range Status   MRSA, PCR NEGATIVE NEGATIVE Final   Staphylococcus aureus NEGATIVE NEGATIVE Final    Comment: (NOTE) The Xpert SA Assay (FDA approved for NASAL specimens in patients 33 years of age and older), is one component of a comprehensive surveillance program. It is not intended to diagnose infection nor to guide or monitor treatment. Performed at [facility], [address] [place], [place] [address]      Time coordinating discharge: 35 minutes  SIGNED:   Rodena Goldmann, DO [facility] 04/26/2022, 9:16 AM  If 7PM-7AM, please contact night-coverage [email or web address]

## 2022-04-26 NOTE — Progress Notes (Signed)
Patient has been awake during shift, conversive,  confused, and refused night time medications. Vitals have been stable.

## 2022-04-26 NOTE — Care Management Important Message (Signed)
Important Message  Patient Details  Name: Kristy [name] MRN: [id number] Date of Birth: Jul 07, 1928   Medicare Important Message Given:  Yes  Reviewed Medicare IM with Genevieve Norlander, son, at [phone].  Per son, still has copy of Medicare IM to reference.     Dannette Barbara 04/26/2022, 12:02 PM

## 2022-04-29 ENCOUNTER — Telehealth: Payer: Self-pay | Admitting: Licensed Clinical Social Worker

## 2022-04-29 NOTE — Telephone Encounter (Signed)
Call received from Amy at [facility], ph# [phone], to schedule post op visit, status/post left hip fracture surgery done 04/23/22.  Appointment has been scheduled for first available after Dr returns to office, 05/14/22.  Please advise as to any other orders needing to be addressed until this appointment date.

## 2022-04-30 ENCOUNTER — Encounter (HOSPITAL_COMMUNITY): Payer: Self-pay | Admitting: *Deleted

## 2022-04-30 ENCOUNTER — Inpatient Hospital Stay (HOSPITAL_COMMUNITY)
Admission: EM | Admit: 2022-04-30 | Discharge: 2022-05-02 | DRG: 394 | Disposition: A | Discharge status: Hospice | Payer: Medicare Other | Source: Skilled Nursing Facility | Attending: Internal Medicine | Admitting: Internal Medicine

## 2022-04-30 ENCOUNTER — Emergency Department (HOSPITAL_COMMUNITY): Payer: Medicare Other

## 2022-04-30 ENCOUNTER — Other Ambulatory Visit: Payer: Self-pay

## 2022-04-30 DIAGNOSIS — F03918 Unspecified dementia, unspecified severity, with other behavioral disturbance: Secondary | ICD-10-CM | POA: Diagnosis present

## 2022-04-30 DIAGNOSIS — Z90710 Acquired absence of both cervix and uterus: Secondary | ICD-10-CM | POA: Diagnosis not present

## 2022-04-30 DIAGNOSIS — R58 Hemorrhage, not elsewhere classified: Secondary | ICD-10-CM | POA: Diagnosis not present

## 2022-04-30 DIAGNOSIS — Z7982 Long term (current) use of aspirin: Secondary | ICD-10-CM

## 2022-04-30 DIAGNOSIS — Z9049 Acquired absence of other specified parts of digestive tract: Secondary | ICD-10-CM | POA: Diagnosis not present

## 2022-04-30 DIAGNOSIS — Z79899 Other long term (current) drug therapy: Secondary | ICD-10-CM

## 2022-04-30 DIAGNOSIS — Z974 Presence of external hearing-aid: Secondary | ICD-10-CM | POA: Diagnosis not present

## 2022-04-30 DIAGNOSIS — N1831 Chronic kidney disease, stage 3a: Secondary | ICD-10-CM | POA: Diagnosis not present

## 2022-04-30 DIAGNOSIS — I129 Hypertensive chronic kidney disease with stage 1 through stage 4 chronic kidney disease, or unspecified chronic kidney disease: Secondary | ICD-10-CM | POA: Diagnosis present

## 2022-04-30 DIAGNOSIS — H40059 Ocular hypertension, unspecified eye: Secondary | ICD-10-CM | POA: Diagnosis present

## 2022-04-30 DIAGNOSIS — F419 Anxiety disorder, unspecified: Secondary | ICD-10-CM | POA: Diagnosis present

## 2022-04-30 DIAGNOSIS — K5730 Diverticulosis of large intestine without perforation or abscess without bleeding: Secondary | ICD-10-CM | POA: Diagnosis not present

## 2022-04-30 DIAGNOSIS — Z515 Encounter for palliative care: Secondary | ICD-10-CM

## 2022-04-30 DIAGNOSIS — K625 Hemorrhage of anus and rectum: Secondary | ICD-10-CM | POA: Diagnosis not present

## 2022-04-30 DIAGNOSIS — E1122 Type 2 diabetes mellitus with diabetic chronic kidney disease: Secondary | ICD-10-CM | POA: Diagnosis not present

## 2022-04-30 DIAGNOSIS — Z8673 Personal history of transient ischemic attack (TIA), and cerebral infarction without residual deficits: Secondary | ICD-10-CM | POA: Diagnosis not present

## 2022-04-30 DIAGNOSIS — K5732 Diverticulitis of large intestine without perforation or abscess without bleeding: Secondary | ICD-10-CM | POA: Diagnosis not present

## 2022-04-30 DIAGNOSIS — Z96653 Presence of artificial knee joint, bilateral: Secondary | ICD-10-CM | POA: Diagnosis present

## 2022-04-30 DIAGNOSIS — Z8619 Personal history of other infectious and parasitic diseases: Secondary | ICD-10-CM | POA: Diagnosis not present

## 2022-04-30 DIAGNOSIS — Z823 Family history of stroke: Secondary | ICD-10-CM | POA: Diagnosis not present

## 2022-04-30 DIAGNOSIS — Z66 Do not resuscitate: Secondary | ICD-10-CM | POA: Diagnosis not present

## 2022-04-30 DIAGNOSIS — Z7984 Long term (current) use of oral hypoglycemic drugs: Secondary | ICD-10-CM

## 2022-04-30 DIAGNOSIS — R52 Pain, unspecified: Secondary | ICD-10-CM | POA: Diagnosis not present

## 2022-04-30 DIAGNOSIS — R739 Hyperglycemia, unspecified: Secondary | ICD-10-CM | POA: Diagnosis not present

## 2022-04-30 DIAGNOSIS — I959 Hypotension, unspecified: Secondary | ICD-10-CM | POA: Diagnosis not present

## 2022-04-30 DIAGNOSIS — Z96642 Presence of left artificial hip joint: Secondary | ICD-10-CM | POA: Diagnosis not present

## 2022-04-30 DIAGNOSIS — H3530 Unspecified macular degeneration: Secondary | ICD-10-CM | POA: Diagnosis present

## 2022-04-30 DIAGNOSIS — Z8249 Family history of ischemic heart disease and other diseases of the circulatory system: Secondary | ICD-10-CM | POA: Diagnosis not present

## 2022-04-30 DIAGNOSIS — K449 Diaphragmatic hernia without obstruction or gangrene: Secondary | ICD-10-CM | POA: Diagnosis present

## 2022-04-30 DIAGNOSIS — R9431 Abnormal electrocardiogram [ECG] [EKG]: Secondary | ICD-10-CM | POA: Diagnosis not present

## 2022-04-30 DIAGNOSIS — H9190 Unspecified hearing loss, unspecified ear: Secondary | ICD-10-CM | POA: Diagnosis present

## 2022-04-30 DIAGNOSIS — K6289 Other specified diseases of anus and rectum: Principal | ICD-10-CM | POA: Diagnosis present

## 2022-04-30 DIAGNOSIS — Z86010 Personal history of colonic polyps: Secondary | ICD-10-CM

## 2022-04-30 DIAGNOSIS — E7800 Pure hypercholesterolemia, unspecified: Secondary | ICD-10-CM | POA: Diagnosis not present

## 2022-04-30 DIAGNOSIS — Z886 Allergy status to analgesic agent status: Secondary | ICD-10-CM

## 2022-04-30 DIAGNOSIS — R6889 Other general symptoms and signs: Secondary | ICD-10-CM | POA: Diagnosis present

## 2022-04-30 DIAGNOSIS — F03C18 Unspecified dementia, severe, with other behavioral disturbance: Secondary | ICD-10-CM | POA: Diagnosis not present

## 2022-04-30 DIAGNOSIS — D62 Acute posthemorrhagic anemia: Secondary | ICD-10-CM | POA: Diagnosis not present

## 2022-04-30 LAB — COMPREHENSIVE METABOLIC PANEL
ALT: 23 U/L (ref 0–44)
AST: 30 U/L (ref 15–41)
Albumin: 2 g/dL — ABNORMAL LOW (ref 3.5–5.0)
Alkaline Phosphatase: 59 U/L (ref 38–126)
Anion gap: 6 (ref 5–15)
BUN: 33 mg/dL — ABNORMAL HIGH (ref 8–23)
CO2: 21 mmol/L — ABNORMAL LOW (ref 22–32)
Calcium: 7.7 mg/dL — ABNORMAL LOW (ref 8.9–10.3)
Chloride: 104 mmol/L (ref 98–111)
Creatinine, Ser: 1.18 mg/dL — ABNORMAL HIGH (ref 0.44–1.00)
GFR, Estimated: 44 mL/min — ABNORMAL LOW (ref >60)
Glucose, Bld: 258 mg/dL — ABNORMAL HIGH (ref 70–99)
Potassium: 4.8 mmol/L (ref 3.5–5.1)
Sodium: 131 mmol/L — ABNORMAL LOW (ref 135–145)
Total Bilirubin: 1.2 mg/dL (ref 0.3–1.2)
Total Protein: 4.7 g/dL — ABNORMAL LOW (ref 6.5–8.1)

## 2022-04-30 LAB — CBC WITH DIFFERENTIAL/PLATELET
Abs Immature Granulocytes: 2 10*3/uL — ABNORMAL HIGH (ref 0.00–0.07)
Basophils Absolute: 0 10*3/uL (ref 0.0–0.1)
Basophils Relative: 0 %
Eosinophils Absolute: 0.3 10*3/uL (ref 0.0–0.5)
Eosinophils Relative: 1 %
HCT: 24.9 % — ABNORMAL LOW (ref 36.0–46.0)
Hemoglobin: 8 g/dL — ABNORMAL LOW (ref 12.0–15.0)
Lymphocytes Relative: 14 %
Lymphs Abs: 3.9 10*3/uL (ref 0.7–4.0)
MCH: 30.9 pg (ref 26.0–34.0)
MCHC: 32.1 g/dL (ref 30.0–36.0)
MCV: 96.1 fL (ref 80.0–100.0)
Metamyelocytes Relative: 4 %
Monocytes Absolute: 2 10*3/uL — ABNORMAL HIGH (ref 0.1–1.0)
Monocytes Relative: 7 %
Myelocytes: 3 %
Neutro Abs: 20 10*3/uL — ABNORMAL HIGH (ref 1.7–7.7)
Neutrophils Relative %: 71 %
Platelets: 207 10*3/uL (ref 150–400)
RBC: 2.59 MIL/uL — ABNORMAL LOW (ref 3.87–5.11)
RDW: 14.7 % (ref 11.5–15.5)
WBC: 28.2 10*3/uL — ABNORMAL HIGH (ref 4.0–10.5)
nRBC: 0.1 % (ref 0.0–0.2)

## 2022-04-30 LAB — I-STAT CHEM 8, ED
BUN: 28 mg/dL — ABNORMAL HIGH (ref 8–23)
Calcium, Ion: 1.12 mmol/L — ABNORMAL LOW (ref 1.15–1.40)
Chloride: 101 mmol/L (ref 98–111)
Creatinine, Ser: 1.2 mg/dL — ABNORMAL HIGH (ref 0.44–1.00)
Glucose, Bld: 256 mg/dL — ABNORMAL HIGH (ref 70–99)
HCT: 21 % — ABNORMAL LOW (ref 36.0–46.0)
Hemoglobin: 7.1 g/dL — ABNORMAL LOW (ref 12.0–15.0)
Potassium: 4.8 mmol/L (ref 3.5–5.1)
Sodium: 132 mmol/L — ABNORMAL LOW (ref 135–145)
TCO2: 19 mmol/L — ABNORMAL LOW (ref 22–32)

## 2022-04-30 LAB — LACTIC ACID, PLASMA: Lactic Acid, Venous: 2.3 mmol/L (ref 0.5–1.9)

## 2022-04-30 LAB — PREPARE RBC (CROSSMATCH)

## 2022-04-30 MED ORDER — SODIUM CHLORIDE 0.9 % IV SOLN: 10.0000 mL/h | Freq: Once | INTRAVENOUS | Status: DC

## 2022-04-30 MED ORDER — MORPHINE SULFATE (PF) 2 MG/ML IV SOLN
1.0000 mg | INTRAVENOUS | Status: DC | PRN
  Administered 2022-04-30 – 2022-05-01 (×2): 1 mg via INTRAVENOUS
  Filled 2022-04-30 (×2): qty 1

## 2022-04-30 MED ORDER — LORAZEPAM 2 MG/ML PO CONC: 1.0000 mg | ORAL | Status: DC | PRN

## 2022-04-30 MED ORDER — HALOPERIDOL LACTATE 5 MG/ML IJ SOLN
0.5000 mg | INTRAMUSCULAR | Status: DC | PRN
  Administered 2022-05-01 – 2022-05-02 (×3): 0.5 mg via INTRAVENOUS
  Filled 2022-04-30 (×3): qty 1

## 2022-04-30 MED ORDER — GLYCOPYRROLATE 1 MG PO TABS: 1.0000 mg | ORAL_TABLET | ORAL | Status: DC | PRN

## 2022-04-30 MED ORDER — SODIUM CHLORIDE 0.9% FLUSH: 3.0000 mL | INTRAVENOUS | Status: DC | PRN

## 2022-04-30 MED ORDER — SODIUM CHLORIDE 0.9% FLUSH
3.0000 mL | Freq: Two times a day (BID) | INTRAVENOUS | Status: DC
  Administered 2022-04-30: 3 mL via INTRAVENOUS

## 2022-04-30 MED ORDER — LORAZEPAM 1 MG PO TABS: 1.0000 mg | ORAL_TABLET | ORAL | Status: DC | PRN

## 2022-04-30 MED ORDER — SODIUM CHLORIDE 0.9 % IV BOLUS
500.0000 mL | Freq: Once | INTRAVENOUS | Status: AC
  Administered 2022-04-30: 500 mL via INTRAVENOUS

## 2022-04-30 MED ORDER — GLYCOPYRROLATE 0.2 MG/ML IJ SOLN: 0.2000 mg | INTRAMUSCULAR | Status: DC | PRN

## 2022-04-30 MED ORDER — IOHEXOL 350 MG/ML SOLN
75.0000 mL | Freq: Once | INTRAVENOUS | Status: AC | PRN
  Administered 2022-04-30: 75 mL via INTRAVENOUS

## 2022-04-30 MED ORDER — LORAZEPAM 2 MG/ML IJ SOLN
1.0000 mg | INTRAMUSCULAR | Status: DC | PRN
  Administered 2022-05-01 – 2022-05-02 (×2): 1 mg via INTRAVENOUS
  Filled 2022-04-30 (×2): qty 1

## 2022-04-30 MED ORDER — SODIUM CHLORIDE 0.9 % IV BOLUS
1000.0000 mL | Freq: Once | INTRAVENOUS | Status: AC
  Administered 2022-04-30: 1000 mL via INTRAVENOUS

## 2022-04-30 MED ORDER — SODIUM CHLORIDE 0.9 % IV SOLN: 250.0000 mL | INTRAVENOUS | Status: DC | PRN

## 2022-04-30 MED ORDER — BIOTENE DRY MOUTH MT LIQD: 15.0000 mL | OROMUCOSAL | Status: DC | PRN

## 2022-04-30 MED ORDER — PIPERACILLIN-TAZOBACTAM 3.375 G IVPB 30 MIN
3.3750 g | Freq: Once | INTRAVENOUS | Status: AC
  Administered 2022-04-30: 3.375 g via INTRAVENOUS
  Filled 2022-04-30: qty 50

## 2022-04-30 MED ORDER — ONDANSETRON 4 MG PO TBDP: 4.0000 mg | ORAL_TABLET | Freq: Four times a day (QID) | ORAL | Status: DC | PRN

## 2022-04-30 MED ORDER — HALOPERIDOL LACTATE 2 MG/ML PO CONC: 0.5000 mg | ORAL | Status: DC | PRN

## 2022-04-30 MED ORDER — PANTOPRAZOLE SODIUM 40 MG IV SOLR
40.0000 mg | Freq: Once | INTRAVENOUS | Status: AC
  Administered 2022-04-30: 40 mg via INTRAVENOUS
  Filled 2022-04-30: qty 10

## 2022-04-30 MED ORDER — ACETAMINOPHEN 325 MG PO TABS: 650.0000 mg | ORAL_TABLET | Freq: Four times a day (QID) | ORAL | Status: DC | PRN

## 2022-04-30 MED ORDER — ACETAMINOPHEN 650 MG RE SUPP: 650.0000 mg | Freq: Four times a day (QID) | RECTAL | Status: DC | PRN

## 2022-04-30 MED ORDER — HALOPERIDOL 0.5 MG PO TABS
0.5000 mg | ORAL_TABLET | ORAL | Status: DC | PRN
  Administered 2022-05-02: 0.5 mg via ORAL
  Filled 2022-04-30: qty 1

## 2022-04-30 MED ORDER — POLYVINYL ALCOHOL 1.4 % OP SOLN: 1.0000 [drp] | Freq: Four times a day (QID) | OPHTHALMIC | Status: DC | PRN

## 2022-04-30 MED ORDER — ONDANSETRON HCL 4 MG/2ML IJ SOLN: 4.0000 mg | Freq: Four times a day (QID) | INTRAMUSCULAR | Status: DC | PRN

## 2022-04-30 NOTE — ED Provider Notes (Signed)
[facility] EMERGENCY DEPARTMENT Provider Note   CSN: [id number] Arrival date & time: 04/30/22  1531     History {Add pertinent medical, surgical, social history, OB history to HPI:1} Chief Complaint  Patient presents with   GI Bleeding    Kristy [name] is a 88 y.o. female.  Patient presents with rectal bleeding.  She recently had a fractured hip repaired.  She has a history of dementia diabetes   Rectal Bleeding      Home Medications Prior to Admission medications   Medication Sig Start Date End Date Taking? Authorizing Provider  aspirin EC 81 MG tablet Take 1 tablet (81 mg total) by mouth in the morning and at bedtime. Swallow whole. 04/26/22 05/26/22  Manuella Ghazi, Pratik D, DO  busPIRone (BUSPAR) 30 MG tablet Take 1 tablet (30 mg total) by mouth at bedtime. Patient taking differently: Take 30 mg by mouth 2 (two) times daily. 04/04/21   Dettinger, Fransisca Kaufmann, MD  cholecalciferol (VITAMIN D) 1000 UNITS tablet Take 1,000 Units by mouth daily.    [provider]  escitalopram (LEXAPRO) 20 MG tablet Take 20 mg by mouth daily.    [provider]  ezetimibe (ZETIA) 10 MG tablet Take 1 tablet (10 mg total) by mouth daily. 04/04/21   Dettinger, Fransisca Kaufmann, MD  ferrous sulfate 325 (65 FE) MG tablet Take 1 tablet (325 mg total) by mouth 2 (two) times daily with a meal. 04/26/22 04/26/23  Manuella Ghazi, Pratik D, DO  glimepiride (AMARYL) 4 MG tablet TAKE 1 TABLET BY MOUTH DAILY WITH BREAKFAST Patient taking differently: Take 2 mg by mouth daily with breakfast. 10/11/21   Dettinger, Fransisca Kaufmann, MD  glucose blood (FREESTYLE TEST STRIPS) test strip Check Blood sugar two times daily 08/24/18   Chipper Herb, MD  glucose blood (ONE TOUCH ULTRA TEST) test strip CHECK BLOOD SUGAR TWO TIMES DAILY 08/25/18   Chipper Herb, MD  guaiFENesin (ROBITUSSIN) 100 MG/5ML liquid Take 5 mLs by mouth every 4 (four) hours as needed for cough or to loosen phlegm.    [provider]  HYDROcodone-acetaminophen  (NORCO/VICODIN) 5-325 MG tablet Take 1-2 tablets by mouth every 6 (six) hours as needed for moderate pain. 04/26/22   Manuella Ghazi, Pratik D, DO  Lancets [facility] ULTRASOFT) lancets Check blood sugars twice a day 09/11/18   Chipper Herb, MD  LUMIGAN 0.01 % SOLN Place 1 drop into both eyes at bedtime. 11/09/15   [provider]  melatonin 3 MG TABS tablet Take 3 mg by mouth at bedtime.    [provider]  metFORMIN (GLUCOPHAGE-XR) 500 MG 24 hr tablet TAKE 2 TABLETS BY MOUTH EVERY DAY WITH BREAKFAST Patient not taking: Reported on 04/22/2022 10/05/21   Dettinger, Fransisca Kaufmann, MD  metoprolol succinate (TOPROL-XL) 25 MG 24 hr tablet Take 1 tablet (25 mg total) by mouth daily. 10/05/21   Dettinger, Fransisca Kaufmann, MD  Multiple Vitamins-Minerals (PRESERVISION AREDS 2 PO) Take by mouth.    [provider]  oxybutynin (DITROPAN) 5 MG tablet Take 5 mg by mouth 2 (two) times daily.    [provider]  pantoprazole (PROTONIX) 40 MG tablet Take 1 tablet (40 mg total) by mouth 2 (two) times daily. 04/26/22 05/26/22  Manuella Ghazi, Pratik D, DO  pilocarpine (PILOCAR) 1 % ophthalmic solution Place 1 drop into both eyes 2 (two) times daily.    [provider]  pravastatin (PRAVACHOL) 80 MG tablet Take 1 tablet (80 mg total) by mouth at bedtime. 10/05/21  Dettinger, Fransisca Kaufmann, MD  traZODone (DESYREL) 50 MG tablet Take 50 mg by mouth at bedtime.    [provider]  vitamin B-12 (CYANOCOBALAMIN) 500 MCG tablet Take 500 mcg by mouth daily.    [provider]      Allergies    Vioxx [rofecoxib]    Review of Systems   Review of Systems  Gastrointestinal:  Positive for hematochezia.    Physical Exam Updated Vital Signs BP (!) 103/42   Pulse 76   Temp (!) 97.5 F (36.4 C) (Oral)   Resp (!) 22   Ht '5\' 5"'$  (1.651 m)   Wt 74.3 kg   SpO2 99%   BMI 27.26 kg/m  Physical Exam  ED Results / Procedures / Treatments   Labs (all labs ordered are listed, but only abnormal results are  displayed) Labs Reviewed  CBC WITH DIFFERENTIAL/PLATELET - Abnormal; Notable for the following components:      Result Value   WBC 28.2 (*)    RBC 2.59 (*)    Hemoglobin 8.0 (*)    HCT 24.9 (*)    Neutro Abs 20.0 (*)    Monocytes Absolute 2.0 (*)    Abs Immature Granulocytes 2.00 (*)    All other components within normal limits  COMPREHENSIVE METABOLIC PANEL - Abnormal; Notable for the following components:   Sodium 131 (*)    CO2 21 (*)    Glucose, Bld 258 (*)    BUN 33 (*)    Creatinine, Ser 1.18 (*)    Calcium 7.7 (*)    Total Protein 4.7 (*)    Albumin 2.0 (*)    GFR, Estimated 44 (*)    All other components within normal limits  LACTIC ACID, PLASMA - Abnormal; Notable for the following components:   Lactic Acid, Venous 2.3 (*)    All other components within normal limits  I-STAT CHEM 8, ED - Abnormal; Notable for the following components:   Sodium 132 (*)    BUN 28 (*)    Creatinine, Ser 1.20 (*)    Glucose, Bld 256 (*)    Calcium, Ion 1.12 (*)    TCO2 19 (*)    Hemoglobin 7.1 (*)    HCT 21.0 (*)    All other components within normal limits  CULTURE, BLOOD (ROUTINE X 2)  CULTURE, BLOOD (ROUTINE X 2)  LACTIC ACID, PLASMA  TYPE AND SCREEN  PREPARE RBC (CROSSMATCH)    EKG None  Radiology CT ANGIO GI BLEED  Result Date: 04/30/2022 CLINICAL DATA:  Acute mesenteric ischemia. Bright red blood from the rectum. EXAM: CTA ABDOMEN AND PELVIS WITHOUT AND WITH CONTRAST TECHNIQUE: Multidetector CT imaging of the abdomen and pelvis was performed using the standard protocol during bolus administration of intravenous contrast. Multiplanar reconstructed images and MIPs were obtained and reviewed to evaluate the vascular anatomy. RADIATION DOSE REDUCTION: This exam was performed according to the departmental dose-optimization program which includes automated exposure control, adjustment of the mA and/or kV according to patient size and/or use of iterative reconstruction technique.  CONTRAST:  21m OMNIPAQUE IOHEXOL 350 MG/ML SOLN COMPARISON:  None Available. FINDINGS: VASCULAR Aorta: There are atherosclerotic calcifications of the aorta. Aorta is normal in size. No evidence for dissection. No significant stenosis. Celiac: Patent without evidence of aneurysm, dissection, vasculitis or significant stenosis. SMA: Patent without evidence of aneurysm, dissection, vasculitis or significant stenosis. Renals: Both renal arteries are patent without evidence of aneurysm, dissection, vasculitis, fibromuscular dysplasia or significant stenosis. IMA: Patent without  evidence of aneurysm, dissection, vasculitis or significant stenosis. Inflow: Patent without evidence of aneurysm, dissection, vasculitis or significant stenosis. Proximal Outflow: Bilateral common femoral and visualized portions of the superficial and profunda femoral arteries are patent without evidence of aneurysm, dissection, vasculitis or significant stenosis. Veins: No obvious venous abnormality within the limitations of this arterial phase study. Review of the MIP images confirms the above findings. NON-VASCULAR Lower chest: There are small bilateral pleural effusions, right greater than left. There is bilateral lower lobe atelectasis. Hepatobiliary: No focal liver abnormality is seen. Status post cholecystectomy. No biliary dilatation. Pancreas: Unremarkable. No pancreatic ductal dilatation or surrounding inflammatory changes. Spleen: Normal in size without focal abnormality. Adrenals/Urinary Tract: There is a 12 mm simple cyst in the right kidney. Otherwise, the kidneys, adrenal glands and bladder are within normal limits. Foley catheter decompresses the bladder. Stomach/Bowel: There is an area of active hemorrhage at the level of the anorectal junction best seen on image 9/229. There is diffuse rectal wall thickening with mild surrounding inflammatory stranding. There is no bowel obstruction or free air. There is no pneumatosis.  Appendix is not seen. There is sigmoid colon diverticulosis without evidence for diverticulitis. Small hiatal hernia is present. The stomach is otherwise within normal limits. Lymphatic: No enlarged lymph nodes are identified. Reproductive: Status post hysterectomy. No adnexal masses. Other: Anterior abdominal wall mesh is present. There is a very small fat containing umbilical hernia. There is no ascites. Presacral edema. There is diffuse body wall edema. Musculoskeletal: Comminuted left intratrochanteric fracture seen with left hip screw and intramedullary nail. There is mild surrounding edema compatible with recent surgery. Lateral skin staples are present. L4-L5 posterior fusion hardware appears chronic and within normal limits. Multilevel degenerative changes affect the spine. IMPRESSION: VASCULAR 1. Active bleeding at the anorectal junction. 2. No evidence for aortic dissection or aneurysm. 3.  Aortic Atherosclerosis (ICD10-I70.0). NON-VASCULAR 1. Rectal wall thickening with surrounding inflammation compatible with proctitis. There is presacral edema. 2. Small bilateral pleural effusions. 3. Body wall edema. 4. Small hiatal hernia. 5. Colonic diverticulosis. 6. Recent ORIF of left femoral intratrochanteric fracture. Electronically Signed   By: Ronney Asters M.D.   On: 04/30/2022 17:13    Procedures Procedures  {Document cardiac monitor, telemetry assessment procedure when appropriate:1}  Medications Ordered in ED Medications  0.9 %  sodium chloride infusion (has no administration in time range)  sodium chloride 0.9 % bolus 500 mL (0 mLs Intravenous Stopped 04/30/22 1703)  pantoprazole (PROTONIX) injection 40 mg (40 mg Intravenous Given 04/30/22 1604)  sodium chloride 0.9 % bolus 1,000 mL (0 mLs Intravenous Stopped 04/30/22 1736)  iohexol (OMNIPAQUE) 350 MG/ML injection 75 mL (75 mLs Intravenous Contrast Given 04/30/22 1643)  piperacillin-tazobactam (ZOSYN) IVPB 3.375 g (0 g Intravenous Stopped 04/30/22  1736)  sodium chloride 0.9 % bolus 500 mL (0 mLs Intravenous Stopped 04/30/22 1736)  sodium chloride 0.9 % bolus 1,000 mL (1,000 mLs Intravenous New Bag/Given 04/30/22 1735)    ED Course/ Medical Decision Making/ A&P Patient with rectal bleeding.  Son has decided to make his mother comfort care.  Patient was seen by general surgery who packed the wound                         Medical Decision Making Amount and/or Complexity of Data Reviewed Labs: ordered. Radiology: ordered.  Risk Prescription drug management. Decision regarding hospitalization.   Patient with rectal bleeding.  She will be admitted for comfort care and attempt to  stop bleeding with packing  {Document critical care time when appropriate:1} {Document review of labs and clinical decision tools ie heart score, Chads2Vasc2 etc:1}  {Document your independent review of radiology images, and any outside records:1} {Document your discussion with family members, caretakers, and with consultants:1} {Document social determinants of health affecting pt's care:1} {Document your decision making why or why not admission, treatments were needed:1} Final Clinical Impression(s) / ED Diagnoses Final diagnoses:  Rectal bleeding    Rx / DC Orders ED Discharge Orders     None

## 2022-04-30 NOTE — ED Notes (Signed)
Verbal consent from son Shadai Mcclane via telephone from Fabio Neighbors, RN and Lelon Frohlich, RN

## 2022-04-30 NOTE — ED Triage Notes (Signed)
Pt brought in by [facility] for c/o gi bleed; staff reported to ems the bed was filled with bright red blood  Cbg 314 BP 100/54  Pt had left hip surgery and has swelling to leg

## 2022-04-30 NOTE — Assessment & Plan Note (Signed)
Patient is a 88 year old Caucasian female with a history of dementia with behavioral disturbances, status post recent left total hip arthroplasty, history of diabetes mellitus, diverticulosis who presents from the skilled nursing facility where she resides for evaluation of rectal bleeding. Noted to have a hemoglobin of 8.0 on admission compared to 10 on discharge on 04/26/22. Patient had a significant drop in her H&H postsurgery and was transfused 2 units of packed RBC with plans for GI evaluation as an outpatient. CT angiogram showed active bleeding at the rectal junction. General surgery was consulted and 3 pieces of quick clot and gauze roll was inserted into the patient's anus to provide pressure and potentially stop area of bleeding. Patient's son has requested for comfort measures only.

## 2022-04-30 NOTE — ED Notes (Signed)
ETA of blood ready about 20 minutes per lab

## 2022-04-30 NOTE — Consult Note (Signed)
[facility] Consult  Reason for Consult: Active rectal bleed Referring Physician: Dr. Roderic Palau  Chief Complaint   GI Bleeding     HPI: Kristy [name] is a 88 y.o. female who presents from her nursing home with active GI bleed.  She was recently admitted in the hospital on 7/24 and underwent palliative left hip replacement for left hip fracture.  She was discharged home to her nursing facility, at which time she was noted to have active bright red blood per rectum.  She presented to the emergency department.  History is unable to be obtained from the patient as she is demented and obtunded.  ED called GI initially, who recommended a CTA of the abdomen and pelvis.  CTA demonstrates active bleed at the anorectal junction with proctitis.  GI recommended consultation of general surgery.  She was not tachycardic, however her blood pressure continued to drop, with a systolic of 60 during the call to me.  Hemoglobin was stable at 8 on initial blood work at 3:45 PM.  Lactic acid was 2.3.  She has received 3 L of IV fluids and is currently receiving her fourth.  She has an antibody, so blood from [facility] was ordered.  A typed unit was ordered for administration to the patient given acute hypotension.  ED physician called the patient's son, who recommends comfort measures for his mother.  The patient is a DNR/DNI.  Per the son, she has dementia, and has not responded well in terms of her mentation since her surgery last week.  Past Medical History:  Diagnosis Date   Anxiety    Colon polyps    Complication of anesthesia 01/18/2013   slow to awaken and felt crazy for 3-4 days   Dementia ([place])    Diabetes mellitus    Diverticulitis    H/O hiatal hernia    Hearing loss 01/07/2013   bilateral hearing aids   History of shingles 01/07/2013   3'14 recent outbreak- drying in    [place] (hard of hearing) 02/05/2018   Hypercholesteremia    Hypertension    Dr. Laurance Flatten   Lumbar spondylosis     osteoarthritis-knees   Macular degeneration of both eyes    and dry eyes   Stroke ([place])    TIA    Past Surgical History:  Procedure Laterality Date   ABDOMINAL HYSTERECTOMY  1987   APPENDECTOMY     pt thinks this was removed when gall bladder was taken out   CATARACT EXTRACTION     CHOLECYSTECTOMY  1989   open   INTRAMEDULLARY (IM) NAIL INTERTROCHANTERIC Left 04/23/2022   Procedure: INTRAMEDULLARY (IM) NAIL INTERTROCHANTRIC;  Surgeon: Mordecai Rasmussen, MD;  Location: [facility];  Service: Orthopedics;  Laterality: Left;   KNEE ARTHROSCOPY  01-07-13   left knee- many yrs ago   Lumbar back surgery  2013   TOTAL KNEE ARTHROPLASTY Left 01/18/2013   Procedure: TOTAL KNEE ARTHROPLASTY;  Surgeon: Gearlean Alf, MD;  Location: [facility];  Service: Orthopedics;  Laterality: Left;   TOTAL KNEE ARTHROPLASTY Right 03/07/2014   Procedure: RIGHT TOTAL KNEE ARTHROPLASTY;  Surgeon: Gearlean Alf, MD;  Location: [facility];  Service: Orthopedics;  Laterality: Right;   VENTRAL HERNIA REPAIR      Family History  Problem Relation Age of Onset   Stroke Father 52   Heart disease Father    Irregular heart beat Father        pacemaker    Healthy Mother  Healthy Son    Heart disease Sister        unknown heart condition    Bipolar disorder Son    COPD Son    Cancer Sister        colon    Social History   Tobacco Use   Smoking status: Never   Smokeless tobacco: Never  Vaping Use   Vaping Use: Never used  Substance Use Topics   Alcohol use: No    Alcohol/week: 0.0 standard drinks of alcohol   Drug use: No    Medications: I have reviewed the patient's current medications.  Allergies  Allergen Reactions   Vioxx [Rofecoxib] Other (See Comments)    Ulcers in mouth      ROS:  Unable to obtain secondary to patient mentation  Blood pressure (!) 103/42, pulse 76, temperature (!) 97.5 F (36.4 C), temperature source Oral, resp. rate (!) 22, height '5\' 5"'$  (1.651 m), weight 74.3 kg, SpO2 99  %. Physical Exam Vitals reviewed.  Constitutional:      Appearance: She is ill-appearing.     Comments: Obtunded  HENT:     Head: Normocephalic and atraumatic.  Cardiovascular:     Rate and Rhythm: Normal rate.  Pulmonary:     Effort: Pulmonary effort is normal.  Abdominal:     Comments: Abdomen soft, mild distention, nontender to percussion and palpation; no rigidity, guarding, rebound tenderness; soft reducible umbilical hernia  Genitourinary:    Comments: On rectal exam, bright red blood is noted to be running from the anus in addition to clots and a small amount of stool.  Visible internal and external hemorrhoids noted, no active bleeding from these areas, active bleed is likely more proximal, and unable to be visualized on examination; 3 pieces of quick clot gauze and small gauze roll was inserted into the patient's anus Skin:    Coloration: Skin is pale.  Neurological:     Mental Status: She is disoriented.     Results: Results for orders placed or performed during the hospital encounter of 04/30/22 (from the past 48 hour(s))  CBC with Differential     Status: Abnormal   Collection Time: 04/30/22  3:45 PM  Result Value Ref Range   WBC 28.2 (H) 4.0 - 10.5 K/uL   RBC 2.59 (L) 3.87 - 5.11 MIL/uL   Hemoglobin 8.0 (L) 12.0 - 15.0 g/dL   HCT 24.9 (L) 36.0 - 46.0 %   MCV 96.1 80.0 - 100.0 fL   MCH 30.9 26.0 - 34.0 pg   MCHC 32.1 30.0 - 36.0 g/dL   RDW 14.7 11.5 - 15.5 %   Platelets 207 150 - 400 K/uL   nRBC 0.1 0.0 - 0.2 %   Neutrophils Relative % 71 %   Neutro Abs 20.0 (H) 1.7 - 7.7 K/uL   Lymphocytes Relative 14 %   Lymphs Abs 3.9 0.7 - 4.0 K/uL   Monocytes Relative 7 %   Monocytes Absolute 2.0 (H) 0.1 - 1.0 K/uL   Eosinophils Relative 1 %   Eosinophils Absolute 0.3 0.0 - 0.5 K/uL   Basophils Relative 0 %   Basophils Absolute 0.0 0.0 - 0.1 K/uL   WBC Morphology MILD LEFT SHIFT (1-5% METAS, OCC MYELO, OCC BANDS)     Comment: SMUDGE CELLS   RBC Morphology MORPHOLOGY  UNREMARKABLE    Smear Review MORPHOLOGY UNREMARKABLE    Metamyelocytes Relative 4 %   Myelocytes 3 %   Abs Immature Granulocytes 2.00 (H) 0.00 - 0.07  K/uL    Comment: Performed at [facility], [address], [place], [place] [address]  Comprehensive metabolic panel     Status: Abnormal   Collection Time: 04/30/22  3:45 PM  Result Value Ref Range   Sodium 131 (L) 135 - 145 mmol/L   Potassium 4.8 3.5 - 5.1 mmol/L   Chloride 104 98 - 111 mmol/L   CO2 21 (L) 22 - 32 mmol/L   Glucose, Bld 258 (H) 70 - 99 mg/dL    Comment: Glucose reference range applies only to samples taken after fasting for at least 8 hours.   BUN 33 (H) 8 - 23 mg/dL   Creatinine, Ser 1.18 (H) 0.44 - 1.00 mg/dL   Calcium 7.7 (L) 8.9 - 10.3 mg/dL   Total Protein 4.7 (L) 6.5 - 8.1 g/dL   Albumin 2.0 (L) 3.5 - 5.0 g/dL   AST 30 15 - 41 U/L   ALT 23 0 - 44 U/L   Alkaline Phosphatase 59 38 - 126 U/L   Total Bilirubin 1.2 0.3 - 1.2 mg/dL   GFR, Estimated 44 (L) >60 mL/min    Comment: (NOTE) Calculated using the CKD-EPI Creatinine Equation (2021)    Anion gap 6 5 - 15    Comment: Performed at [facility], [address], [place], [place] [address]  Type and screen     Status: None (Preliminary result)   Collection Time: 04/30/22  3:45 PM  Result Value Ref Range   ABO/RH(D) A POS    Antibody Screen POS    Sample Expiration 05/03/2022,2359    Unit Number [id number]    Blood Component Type RED CELLS,LR    Unit division 00    Status of Unit ALLOCATED    Unit tag comment EMERGENCY RELEASE    Transfusion Status      OK TO TRANSFUSE Performed at [facility], [address] [place], [place] [address]    Crossmatch Result PENDING    Unit Number [id number]    Blood Component Type RED CELLS,LR    Unit division 00    Status of Unit ALLOCATED    Unit tag comment EMERGENCY RELEASE    Transfusion Status OK TO TRANSFUSE    Crossmatch Result PENDING   Prepare RBC (crossmatch)     Status: None   Collection Time:  04/30/22  4:00 PM  Result Value Ref Range   Order Confirmation      ORDER PROCESSED BY BLOOD BANK Performed at [facility], [address] [place],  [address]   I-stat chem 8, ED (not at [facility] or [facility])     Status: Abnormal   Collection Time: 04/30/22  4:13 PM  Result Value Ref Range   Sodium 132 (L) 135 - 145 mmol/L   Potassium 4.8 3.5 - 5.1 mmol/L   Chloride 101 98 - 111 mmol/L   BUN 28 (H) 8 - 23 mg/dL   Creatinine, Ser 1.20 (H) 0.44 - 1.00 mg/dL   Glucose, Bld 256 (H) 70 - 99 mg/dL    Comment: Glucose reference range applies only to samples taken after fasting for at least 8 hours.   Calcium, Ion 1.12 (L) 1.15 - 1.40 mmol/L   TCO2 19 (L) 22 - 32 mmol/L   Hemoglobin 7.1 (L) 12.0 - 15.0 g/dL   HCT 21.0 (L) 36.0 - 46.0 %  Blood culture (routine x 2)     Status: None (Preliminary result)   Collection Time: 04/30/22  4:53 PM   Specimen: Left Antecubital;  Blood  Result Value Ref Range   Specimen Description LEFT ANTECUBITAL    Special Requests      BOTTLES DRAWN AEROBIC AND ANAEROBIC Blood Culture adequate volume Performed at [facility], [address] [place], [place] [address]    Culture PENDING    Report Status PENDING   Lactic acid, plasma     Status: Abnormal   Collection Time: 04/30/22  5:27 PM  Result Value Ref Range   Lactic Acid, Venous 2.3 (HH) 0.5 - 1.9 mmol/L    Comment: CRITICAL RESULT CALLED TO, READ BACK BY AND VERIFIED WITH: SMALLWOOD,M ON 04/30/22 AT 1810 BY LOY,C Performed at [facility], [address] [place], [place] [address]     CT ANGIO GI BLEED  Result Date: 04/30/2022 CLINICAL DATA:  Acute mesenteric ischemia. Bright red blood from the rectum. EXAM: CTA ABDOMEN AND PELVIS WITHOUT AND WITH CONTRAST TECHNIQUE: Multidetector CT imaging of the abdomen and pelvis was performed using the standard protocol during bolus administration of intravenous contrast. Multiplanar reconstructed images and MIPs were obtained and reviewed to evaluate the vascular  anatomy. RADIATION DOSE REDUCTION: This exam was performed according to the departmental dose-optimization program which includes automated exposure control, adjustment of the mA and/or kV according to patient size and/or use of iterative reconstruction technique. CONTRAST:  63m OMNIPAQUE IOHEXOL 350 MG/ML SOLN COMPARISON:  None Available. FINDINGS: VASCULAR Aorta: There are atherosclerotic calcifications of the aorta. Aorta is normal in size. No evidence for dissection. No significant stenosis. Celiac: Patent without evidence of aneurysm, dissection, vasculitis or significant stenosis. SMA: Patent without evidence of aneurysm, dissection, vasculitis or significant stenosis. Renals: Both renal arteries are patent without evidence of aneurysm, dissection, vasculitis, fibromuscular dysplasia or significant stenosis. IMA: Patent without evidence of aneurysm, dissection, vasculitis or significant stenosis. Inflow: Patent without evidence of aneurysm, dissection, vasculitis or significant stenosis. Proximal Outflow: Bilateral common femoral and visualized portions of the superficial and profunda femoral arteries are patent without evidence of aneurysm, dissection, vasculitis or significant stenosis. Veins: No obvious venous abnormality within the limitations of this arterial phase study. Review of the MIP images confirms the above findings. NON-VASCULAR Lower chest: There are small bilateral pleural effusions, right greater than left. There is bilateral lower lobe atelectasis. Hepatobiliary: No focal liver abnormality is seen. Status post cholecystectomy. No biliary dilatation. Pancreas: Unremarkable. No pancreatic ductal dilatation or surrounding inflammatory changes. Spleen: Normal in size without focal abnormality. Adrenals/Urinary Tract: There is a 12 mm simple cyst in the right kidney. Otherwise, the kidneys, adrenal glands and bladder are within normal limits. Foley catheter decompresses the bladder.  Stomach/Bowel: There is an area of active hemorrhage at the level of the anorectal junction best seen on image 9/229. There is diffuse rectal wall thickening with mild surrounding inflammatory stranding. There is no bowel obstruction or free air. There is no pneumatosis. Appendix is not seen. There is sigmoid colon diverticulosis without evidence for diverticulitis. Small hiatal hernia is present. The stomach is otherwise within normal limits. Lymphatic: No enlarged lymph nodes are identified. Reproductive: Status post hysterectomy. No adnexal masses. Other: Anterior abdominal wall mesh is present. There is a very small fat containing umbilical hernia. There is no ascites. Presacral edema. There is diffuse body wall edema. Musculoskeletal: Comminuted left intratrochanteric fracture seen with left hip screw and intramedullary nail. There is mild surrounding edema compatible with recent surgery. Lateral skin staples are present. L4-L5 posterior fusion hardware appears chronic and within normal limits. Multilevel degenerative changes affect the spine. IMPRESSION: VASCULAR  1. Active bleeding at the anorectal junction. 2. No evidence for aortic dissection or aneurysm. 3.  Aortic Atherosclerosis (ICD10-I70.0). NON-VASCULAR 1. Rectal wall thickening with surrounding inflammation compatible with proctitis. There is presacral edema. 2. Small bilateral pleural effusions. 3. Body wall edema. 4. Small hiatal hernia. 5. Colonic diverticulosis. 6. Recent ORIF of left femoral intratrochanteric fracture. Electronically Signed   By: Ronney Asters M.D.   On: 04/30/2022 17:13     Assessment & Plan:  MAKENIZE MESSMAN is a 88 y.o. female who presents with bright red blood per rectum.  Imaging and blood work evaluated by myself.  -3 pieces of quick clot, 1 small gauze roll, and another partial small gauze roll were inserted into the patient's anus to try to provide pressure and potentially stop the area of bleeding -I discussed  goals of care with the patient's son and his wife, who want to make the patient comfortable given her dementia and age.  They do not want her to undergo any surgical intervention.  I did explain that if she were to undergo surgical intervention, she could potentially die in the operating room.  They further reiterated that they just want to make her comfortable.  I did explain that it is reasonable for [place] to give her a unit of blood and see if the bleeding stops with the rectal packing, however if she does continue to bleed further, I would recommend against giving her any further blood and just making her comfortable. -Okay with 1 unit of blood, however would recommend against further units if patient continues to bleed -Monitor for further episodes of rectal bleeding -Patient's family understands that if the current packing becomes saturated or she passes it with a bloody bowel movement, that we will plan to just make her comfortable with no further interventions.  They are agreeable to this. -Patient to be admitted to the hospitalist for comfort care  All questions were answered to the satisfaction of the family.  -- Kristy Freer, DO [facility] [address] [place], [place] [address] [phone] (office)

## 2022-04-30 NOTE — H&P (Signed)
History and Physical    Patient: Kristy [name] GQQ:[id number] DOB: 10-28-1927 DOA: 04/30/2022 DOS: the patient was seen and examined on 04/30/2022 PCP: System, Provider Not In  Patient coming from: SNF  Chief Complaint:  Chief Complaint  Patient presents with   GI Bleeding   HPI: GEORGIANA [name] is a 88 y.o. female with medical history significant for dementia with behavioral disturbances, hypertension, dyslipidemia, history of stroke/TIA, diabetes mellitus, history of macular degeneration, status post recent left total hip arthroplasty following an unwitnessed mechanical fall on 07/25 who was brought into the ER from the skilled nursing facility for evaluation of rectal bleeding. Postoperatively patient was found to have acute anemia with a hemoglobin of 6.3 and was transfused 2 units of packed RBC with appropriate response.  She was noted to be iron deficient and was scheduled for GI evaluation as an outpatient.  She has been on aspirin for DVT prophylaxis. Patient was noted to be hypotensive upon arrival to the ER and had a CT angiogram which showed active bleeding at the anorectal junction.  Rectal wall thickening with surrounding inflammation compatible with proctitis.  Surgery was consulted and 3 pieces of quick clot as well as gauze roll were inserted into the patient's anus to provide pressure and potentially stop the area of bleeding. Patient will be transfused 1 unit of packed RBC. Patient's son who is at the bedside has requested that his mother be placed on comfort measures. I am unable to do a review of systems due to patient's mental status.  Review of Systems: unable to review all systems due to the inability of the patient to answer questions. Past Medical History:  Diagnosis Date   Anxiety    Colon polyps    Complication of anesthesia 01/18/2013   slow to awaken and felt crazy for 3-4 days   Dementia ([place])    Diabetes mellitus    Diverticulitis    H/O hiatal hernia     Hearing loss 01/07/2013   bilateral hearing aids   History of shingles 01/07/2013   3'14 recent outbreak- drying in    [place] (hard of hearing) 02/05/2018   Hypercholesteremia    Hypertension    Dr. Laurance Flatten   Lumbar spondylosis    osteoarthritis-knees   Macular degeneration of both eyes    and dry eyes   Stroke ([place])    TIA   Past Surgical History:  Procedure Laterality Date   ABDOMINAL HYSTERECTOMY  1987   APPENDECTOMY     pt thinks this was removed when gall bladder was taken out   CATARACT EXTRACTION     CHOLECYSTECTOMY  1989   open   INTRAMEDULLARY (IM) NAIL INTERTROCHANTERIC Left 04/23/2022   Procedure: INTRAMEDULLARY (IM) NAIL INTERTROCHANTRIC;  Surgeon: Mordecai Rasmussen, MD;  Location: [facility];  Service: Orthopedics;  Laterality: Left;   KNEE ARTHROSCOPY  01-07-13   left knee- many yrs ago   Lumbar back surgery  2013   TOTAL KNEE ARTHROPLASTY Left 01/18/2013   Procedure: TOTAL KNEE ARTHROPLASTY;  Surgeon: Gearlean Alf, MD;  Location: [facility];  Service: Orthopedics;  Laterality: Left;   TOTAL KNEE ARTHROPLASTY Right 03/07/2014   Procedure: RIGHT TOTAL KNEE ARTHROPLASTY;  Surgeon: Gearlean Alf, MD;  Location: [facility];  Service: Orthopedics;  Laterality: Right;   VENTRAL HERNIA REPAIR     Social History:  reports that she has never smoked. She has never used smokeless tobacco. She reports that she does not drink alcohol and does not  use drugs.  Allergies  Allergen Reactions   Vioxx [Rofecoxib] Other (See Comments)    Ulcers in mouth     Family History  Problem Relation Age of Onset   Stroke Father 27   Heart disease Father    Irregular heart beat Father        pacemaker    Healthy Mother    Healthy Son    Heart disease Sister        unknown heart condition    Bipolar disorder Son    COPD Son    Cancer Sister        colon    Prior to Admission medications   Medication Sig Start Date End Date Taking? Authorizing Provider  aspirin EC 81 MG tablet Take 1 tablet (81  mg total) by mouth in the morning and at bedtime. Swallow whole. 04/26/22 05/26/22  Manuella Ghazi, Pratik D, DO  busPIRone (BUSPAR) 30 MG tablet Take 1 tablet (30 mg total) by mouth at bedtime. Patient taking differently: Take 30 mg by mouth 2 (two) times daily. 04/04/21   Dettinger, Fransisca Kaufmann, MD  cholecalciferol (VITAMIN D) 1000 UNITS tablet Take 1,000 Units by mouth daily.    [provider]  escitalopram (LEXAPRO) 20 MG tablet Take 20 mg by mouth daily.    [provider]  ezetimibe (ZETIA) 10 MG tablet Take 1 tablet (10 mg total) by mouth daily. 04/04/21   Dettinger, Fransisca Kaufmann, MD  ferrous sulfate 325 (65 FE) MG tablet Take 1 tablet (325 mg total) by mouth 2 (two) times daily with a meal. 04/26/22 04/26/23  Manuella Ghazi, Pratik D, DO  glimepiride (AMARYL) 4 MG tablet TAKE 1 TABLET BY MOUTH DAILY WITH BREAKFAST Patient taking differently: Take 2 mg by mouth daily with breakfast. 10/11/21   Dettinger, Fransisca Kaufmann, MD  glucose blood (FREESTYLE TEST STRIPS) test strip Check Blood sugar two times daily 08/24/18   Chipper Herb, MD  glucose blood (ONE TOUCH ULTRA TEST) test strip CHECK BLOOD SUGAR TWO TIMES DAILY 08/25/18   Chipper Herb, MD  guaiFENesin (ROBITUSSIN) 100 MG/5ML liquid Take 5 mLs by mouth every 4 (four) hours as needed for cough or to loosen phlegm.    [provider]  HYDROcodone-acetaminophen (NORCO/VICODIN) 5-325 MG tablet Take 1-2 tablets by mouth every 6 (six) hours as needed for moderate pain. 04/26/22   Manuella Ghazi, Pratik D, DO  Lancets [facility] ULTRASOFT) lancets Check blood sugars twice a day 09/11/18   Chipper Herb, MD  LUMIGAN 0.01 % SOLN Place 1 drop into both eyes at bedtime. 11/09/15   [provider]  melatonin 3 MG TABS tablet Take 3 mg by mouth at bedtime.    [provider]  metFORMIN (GLUCOPHAGE-XR) 500 MG 24 hr tablet TAKE 2 TABLETS BY MOUTH EVERY DAY WITH BREAKFAST Patient not taking: Reported on 04/22/2022 10/05/21   Dettinger, Fransisca Kaufmann, MD  metoprolol  succinate (TOPROL-XL) 25 MG 24 hr tablet Take 1 tablet (25 mg total) by mouth daily. 10/05/21   Dettinger, Fransisca Kaufmann, MD  Multiple Vitamins-Minerals (PRESERVISION AREDS 2 PO) Take by mouth.    [provider]  oxybutynin (DITROPAN) 5 MG tablet Take 5 mg by mouth 2 (two) times daily.    [provider]  pantoprazole (PROTONIX) 40 MG tablet Take 1 tablet (40 mg total) by mouth 2 (two) times daily. 04/26/22 05/26/22  Manuella Ghazi, Pratik D, DO  pilocarpine (PILOCAR) 1 % ophthalmic solution Place 1 drop into both eyes 2 (two) times  daily.    [provider]  pravastatin (PRAVACHOL) 80 MG tablet Take 1 tablet (80 mg total) by mouth at bedtime. 10/05/21   Dettinger, Fransisca Kaufmann, MD  traZODone (DESYREL) 50 MG tablet Take 50 mg by mouth at bedtime.    [provider]  vitamin B-12 (CYANOCOBALAMIN) 500 MCG tablet Take 500 mcg by mouth daily.    [provider]    Physical Exam: Vitals:   04/30/22 1730 04/30/22 1738 04/30/22 1740 04/30/22 1830  BP: (!) 96/39  (!) 103/42 (!) 82/38  Pulse: 74 72 76 73  Resp: (!) 21 20 (!) 22 (!) 30  Temp:      TempSrc:      SpO2: 96% 100% 99% 97%  Weight:      Height:       Physical Exam Vitals and nursing note reviewed.  Constitutional:      Comments: Lethargic.  Opens eyes to loud verbal stimuli  HENT:     Head: Normocephalic and atraumatic.     Nose: Nose normal.     Mouth/Throat:     Mouth: Mucous membranes are moist.  Eyes:     Comments: Pale conjunctiva  Cardiovascular:     Rate and Rhythm: Normal rate and regular rhythm.  Pulmonary:     Effort: Pulmonary effort is normal.     Breath sounds: Rales present.     Comments: Rales at the bases bilaterally Abdominal:     General: Abdomen is flat. Bowel sounds are normal.     Palpations: Abdomen is soft.  Musculoskeletal:     Cervical back: Normal range of motion and neck supple.     Right lower leg: Edema present.     Left lower leg: Edema present.     Comments: Decreased  range of motion left hip.  Ecchymosis over left lower extremity  Neurological:     Comments: Unable to assess  Psychiatric:     Comments: Unable to assess     Data Reviewed: Relevant notes from primary care and specialist visits, past discharge summaries as available in EHR, including Care Everywhere. Prior diagnostic testing as pertinent to current admission diagnoses Updated medications and problem lists for reconciliation ED course, including vitals, labs, imaging, treatment and response to treatment Triage notes, nursing and pharmacy notes and ED provider's notes Notable results as noted in HPI Labs reviewed.  Lactic acid 2.3, sodium 131, potassium 4.8, chloride 104, bicarb 21, glucose 258, BUN 33, creatinine 1.18, calcium 7.7, total protein 4.7, albumin 2.0, AST 30, ALT 23, alk phos 59, white count 28.2, hemoglobin 8.0, hematocrit 24.9, platelet count 207 CT angiogram of the abdomen and pelvis shows active bleeding at the anorectal junction.No evidence for aortic dissection or aneurysm. Aortic Atherosclerosis .Rectal wall thickening with surrounding inflammation compatible with proctitis. There is presacral edema. Small bilateral pleural effusions. Body wall edema.Small hiatal hernia.Colonic diverticulosis. Recent ORIF of left femoral intratrochanteric fracture. Twelve-lead EKG reviewed by me shows sinus rhythm.  Low voltage QRS There are no new results to review at this time.  Assessment and Plan: * Rectal bleeding Patient is a 88 year old Caucasian female with a history of dementia with behavioral disturbances, status post recent left total hip arthroplasty, history of diabetes mellitus, diverticulosis who presents from the skilled nursing facility where she resides for evaluation of rectal bleeding. Noted to have a hemoglobin of 8.0 on admission compared to 10 on discharge on 04/26/22. Patient had a significant drop in her H&H postsurgery and was transfused 2  units of packed RBC with  plans for GI evaluation as an outpatient. CT angiogram showed active bleeding at the rectal junction. General surgery was consulted and 3 pieces of quick clot and gauze roll was inserted into the patient's anus to provide pressure and potentially stop area of bleeding. Patient's son has requested for comfort measures only.      Advance Care Planning:   Code Status: DNR   Consults: Palliative care  Family Communication: Greater than 50% of time was spent discussing patient's condition and plan of care with her son and daughter-in-law at the bedside.  All questions and concerns have been addressed.  They verbalized understanding and have requested the patient be placed on comfort measures.  Patient is a DO NOT RESUSCITATE  Severity of Illness: The appropriate patient status for this patient is INPATIENT. Inpatient status is judged to be reasonable and necessary in order to provide the required intensity of service to ensure the patient's safety. The patient's presenting symptoms, physical exam findings, and initial radiographic and laboratory data in the context of their chronic comorbidities is felt to place them at high risk for further clinical deterioration. Furthermore, it is not anticipated that the patient will be medically stable for discharge from the hospital within 2 midnights of admission.   * I certify that at the point of admission it is my clinical judgment that the patient will require inpatient hospital care spanning beyond 2 midnights from the point of admission due to high intensity of service, high risk for further deterioration and high frequency of surveillance required.*  Author: Collier Bullock, MD 04/30/2022 7:29 PM  For on call review [email or web address].

## 2022-04-30 NOTE — ED Notes (Signed)
Patient transported to CT 

## 2022-05-01 ENCOUNTER — Encounter (HOSPITAL_COMMUNITY): Payer: Self-pay | Admitting: Internal Medicine

## 2022-05-01 DIAGNOSIS — F03C18 Unspecified dementia, severe, with other behavioral disturbance: Secondary | ICD-10-CM | POA: Diagnosis not present

## 2022-05-01 DIAGNOSIS — Z515 Encounter for palliative care: Secondary | ICD-10-CM | POA: Diagnosis not present

## 2022-05-01 DIAGNOSIS — K625 Hemorrhage of anus and rectum: Secondary | ICD-10-CM | POA: Diagnosis not present

## 2022-05-01 MED ORDER — MORPHINE SULFATE (PF) 2 MG/ML IV SOLN
1.0000 mg | INTRAVENOUS | Status: DC | PRN
  Administered 2022-05-02 (×2): 1 mg via INTRAVENOUS
  Filled 2022-05-01 (×2): qty 1

## 2022-05-01 NOTE — Progress Notes (Signed)
Communication attempted to get continuation order for existing foley catheter. Charge Nurse made aware and passed to oncoming nurse.

## 2022-05-01 NOTE — Plan of Care (Signed)
  Problem: Education: Goal: Ability to identify signs and symptoms of gastrointestinal bleeding will improve Outcome: Progressing   

## 2022-05-01 NOTE — Progress Notes (Signed)
Progress Note   Patient: Kristy [name] DOB: Mar 02, 1928 DOA: 04/30/2022     1 DOS: the patient was seen and examined on 05/01/2022   Brief hospital admission course: As per H&P written by Dr. Francine Graven on 04/30/2022 Kristy [name] is a 88 y.o. female with medical history significant for dementia with behavioral disturbances, hypertension, dyslipidemia, history of stroke/TIA, diabetes mellitus, history of macular degeneration, status post recent left total hip arthroplasty following an unwitnessed mechanical fall on 07/25 who was brought into the ER from the skilled nursing facility for evaluation of rectal bleeding. Postoperatively patient was found to have acute anemia with a hemoglobin of 6.3 and was transfused 2 units of packed RBC with appropriate response.  She was noted to be iron deficient and was scheduled for GI evaluation as an outpatient.  She has been on aspirin for DVT prophylaxis. Patient was noted to be hypotensive upon arrival to the ER and had a CT angiogram which showed active bleeding at the anorectal junction.  Rectal wall thickening with surrounding inflammation compatible with proctitis.  Surgery was consulted and 3 pieces of quick clot as well as gauze roll were inserted into the patient's anus to provide pressure and potentially stop the area of bleeding. Patient will be transfused 1 unit of packed RBC. Patient's son who is at the bedside has requested that his mother be placed on comfort measures.  Assessment and Plan: * Rectal bleeding -Patient is a 88 year old Caucasian female with a history of dementia with behavioral disturbances, status post recent left total hip arthroplasty, history of diabetes mellitus, diverticulosis and acute work up suggesting proctitis -After discussing with patient's son decision has been made to pursued comfort measures only. -No further blood work with confusion will be pursued -Appreciate assistance and recommendation with  symptomatic management by palliative care -Patient has been seen by hospice with intention to be transferred to residential hospice on 05/02/2022 (bed/staff availability).  Closed fracture of proximal femur -Status post surgical repair -Continue as needed analgesics as part of symptomatic management.  History of TIA/stroke -No new focal deficit appreciated.  Increased intraocular pressure -Continue eyedrops.  Chronic kidney disease a stage IIIa -Last checked appears to be stable -Care has been transitioned to full comfort care; continue symptomatic management only.  Dementia with behavioral disturbances -Continue as needed anxiolytic medication -Continue supportive care and comfort management.  Type 2 diabetes with nephropathy/essential hypertension -Vital signs stable -Patient's care transitioned to full comfort.   Subjective:  Afebrile, in no major distress currently.  Chronically ill in appearance.  Physical Exam: Vitals:   04/30/22 2315 04/30/22 2333 05/01/22 0355 05/01/22 1425  BP: (!) 82/43 (!) 68/34 (!) 76/60 111/61  Pulse: 82 (!) 38 86 (!) 107  Resp: (!) 23 19 (!) 21 20  Temp:  (!) 97.3 F (36.3 C) (!) 97.5 F (36.4 C) 98 F (36.7 C)  TempSrc:  Oral  Axillary  SpO2: 100% 90% 99% 98%  Weight:      Height:       General exam: Pleasantly confused; known dementia. Respiratory system: Good saturation; no using accessory muscles. Cardiovascular system: Rate controlled; no rubs or gallops. Gastrointestinal system: Abdomen is nondistended, soft and nontender. No organomegaly or masses felt. Normal bowel sounds heard. Central nervous system: No focal neurologic deficit.  Moving 4 limbs subcutaneously. Extremities: No cyanosis or clubbing. Skin: No petechiae. Psychiatry: Judgement and insight appear impaired secondary to underlying dementia.  Data Reviewed: Full comfort care.  Family Communication: No family  at bedside.  Disposition: Status is:  Inpatient Remains inpatient appropriate because: Comfort care/symptomatic management.   Planned Discharge Destination: Residential hospice.  Author: Barton Dubois, MD 05/01/2022 4:19 PM  For on call review [email or web address].

## 2022-05-01 NOTE — Consult Note (Signed)
Consultation Note Date: 05/01/2022   Patient Name: Kristy [name]  DOB: 01-23-1928  MRN: [id number]  Age / Sex: 88 y.o., female  PCP: System, Provider Not In Referring Physician: Barton Dubois, MD  Reason for Consultation: Establishing goals of care and Terminal Care  HPI/Patient Profile: 88 y.o. female  with past medical history of dementia with behavioral disturbances, HTN/HLD, history of stroke/TIA, DM, macular degeneration, mechanical fall on 725 with resultant left hip total arthroplasty, resident at [facility] under long-term care for 4 to 6 weeks, admitted on 04/30/2022 with rectal bleeding, end-of-life care.   Clinical Assessment and Goals of Care: I have reviewed medical records including EPIC notes, labs and imaging, received report from RN, assessed the patient.  Mrs. Standiford is lying quietly in bed.  She appears acutely/chronically ill and frail.  She has known dementia, therefore I do not ask orientation questions.  She is able to mumble, but clearly cannot make her basic needs known.  There is no family at bedside at this time.  Her son has returned home for a few hours rest but will return later in the morning.  Call to son/HCPOA, Genevieve Norlander, to discuss diagnosis prognosis, GOC, EOL wishes, disposition and options.  I introduced Palliative Medicine as specialized medical care for people living with serious illness. It focuses on providing relief from the symptoms and stress of a serious illness. The goal is to improve quality of life for both the patient and the family.  We discussed a brief life review of the patient.  Mrs. Scobey was placed under long-term care at [facility] approximately 4 to 6 weeks ago, due to family's inability to continue to care for her at home with her advancing dementia with behavioral disturbances.  Her husband was hospitalized around the same time, elected comfort care and  died here at [facility].  We then focused on their current illness.  We talked about Mrs. Orsino's acute health concerns, her declines over the last few weeks/months.  We talked about comfort care, symptom management.  End-of-life order set implemented.  The natural disease trajectory and expectations at EOL were discussed.  Advanced directives, concepts specific to code status, artifical feeding and hydration, and rehospitalization were considered and discussed.  Mrs. Grether is DNR.  Hospice Care services outpatient were explained and offered.  We talked about the benefits of residential hospice for comfort and dignity at end of life.  Provider choice offered.  Son requests [facility].  Discussed the importance of continued conversation with family and the medical providers regarding overall plan of care and treatment options, ensuring decisions are within the context of the patient's values and GOCs. Questions and concerns were addressed.  The family was encouraged to call with questions or concerns.  PMT will continue to support holistically.  Conference with attending, bedside nursing staff, transition of care team related to patient condition, needs, goals of care, disposition.   HCPOA  NEXT OF KIN -son, Adileny Delon    SUMMARY OF RECOMMENDATIONS  Comfort and dignity at end-of-life, full comfort care Requesting specialized care with [facility]   Code Status/Advance Care Planning: DNR -Caleen Jobs form is on chart.  Symptom Management:  End-of-life order set implemented  Palliative Prophylaxis:  Frequent Pain Assessment, Oral Care, and Turn Reposition  Additional Recommendations (Limitations, Scope, Preferences): Full Comfort Care  Psycho-social/Spiritual:  Desire for further Chaplaincy support:no Additional Recommendations: Caregiving  Support/Resources, Education on Hospice, and Grief/Bereavement Support  Prognosis:  < 2  weeks anticipated based on chronic illness burden, dementia with behavioral disturbances, recent left hip repair, new onset GI bleed, family's desire to focus on comfort and dignity, let nature take its course.  Discharge Planning:  Requesting comfort and dignity at end-of-life, residential hospice at [facility] in [place].       Primary Diagnoses: Present on Admission:  Rectal bleeding   I have reviewed the medical record, interviewed the patient and family, and examined the patient. The following aspects are pertinent.  Past Medical History:  Diagnosis Date   Anxiety    Colon polyps    Complication of anesthesia 01/18/2013   slow to awaken and felt crazy for 3-4 days   Dementia ([place])    Diabetes mellitus    Diverticulitis    H/O hiatal hernia    Hearing loss 01/07/2013   bilateral hearing aids   History of shingles 01/07/2013   3'14 recent outbreak- drying in    [place] (hard of hearing) 02/05/2018   Hypercholesteremia    Hypertension    Dr. Laurance Flatten   Lumbar spondylosis    osteoarthritis-knees   Macular degeneration of both eyes    and dry eyes   Stroke ([place])    TIA   Social History   Socioeconomic History   Marital status: Married    Spouse name: [name]    Number of children: 2   Years of education: Not on file   Highest education level: Not on file  Occupational History   Occupation: retired    Comment: Science writer   Tobacco Use   Smoking status: Never   Smokeless tobacco: Never  Vaping Use   Vaping Use: Never used  Substance and Sexual Activity   Alcohol use: No    Alcohol/week: 0.0 standard drinks of alcohol   Drug use: No   Sexual activity: Not Currently  Other Topics Concern   Not on file  Social History Narrative   Lives with husband   Social Determinants of Health   Financial Resource Strain: Not on file  Food Insecurity: Not on file  Transportation Needs: Not on file  Physical Activity: Not on file  Stress: Not on file  Social  Connections: Not on file   Family History  Problem Relation Age of Onset   Stroke Father 14   Heart disease Father    Irregular heart beat Father        pacemaker    Healthy Mother    Healthy Son    Heart disease Sister        unknown heart condition    Bipolar disorder Son    COPD Son    Cancer Sister        colon   Scheduled Meds:  sodium chloride flush  3 mL Intravenous Q12H   Continuous Infusions:  sodium chloride     sodium chloride     PRN Meds:.sodium chloride, acetaminophen **OR** acetaminophen, antiseptic oral rinse, glycopyrrolate **OR** glycopyrrolate **OR** glycopyrrolate, haloperidol **OR** haloperidol **OR** haloperidol lactate, LORazepam **OR** [  DISCONTINUED] LORazepam **OR** LORazepam, morphine injection, ondansetron **OR** ondansetron (ZOFRAN) IV, polyvinyl alcohol, sodium chloride flush Medications Prior to Admission:  Prior to Admission medications   Medication Sig Start Date End Date Taking? Authorizing Provider  aspirin EC 81 MG tablet Take 1 tablet (81 mg total) by mouth in the morning and at bedtime. Swallow whole. 04/26/22 05/26/22  Manuella Ghazi, Pratik D, DO  busPIRone (BUSPAR) 30 MG tablet Take 1 tablet (30 mg total) by mouth at bedtime. Patient taking differently: Take 30 mg by mouth 2 (two) times daily. 04/04/21   Dettinger, Fransisca Kaufmann, MD  cholecalciferol (VITAMIN D) 1000 UNITS tablet Take 1,000 Units by mouth daily.    [provider]  escitalopram (LEXAPRO) 20 MG tablet Take 20 mg by mouth daily.    [provider]  ezetimibe (ZETIA) 10 MG tablet Take 1 tablet (10 mg total) by mouth daily. 04/04/21   Dettinger, Fransisca Kaufmann, MD  ferrous sulfate 325 (65 FE) MG tablet Take 1 tablet (325 mg total) by mouth 2 (two) times daily with a meal. 04/26/22 04/26/23  Manuella Ghazi, Pratik D, DO  glimepiride (AMARYL) 4 MG tablet TAKE 1 TABLET BY MOUTH DAILY WITH BREAKFAST Patient taking differently: Take 2 mg by mouth daily with breakfast. 10/11/21   Dettinger, Fransisca Kaufmann, MD   glucose blood (FREESTYLE TEST STRIPS) test strip Check Blood sugar two times daily 08/24/18   Chipper Herb, MD  glucose blood (ONE TOUCH ULTRA TEST) test strip CHECK BLOOD SUGAR TWO TIMES DAILY 08/25/18   Chipper Herb, MD  guaiFENesin (ROBITUSSIN) 100 MG/5ML liquid Take 5 mLs by mouth every 4 (four) hours as needed for cough or to loosen phlegm.    [provider]  HYDROcodone-acetaminophen (NORCO/VICODIN) 5-325 MG tablet Take 1-2 tablets by mouth every 6 (six) hours as needed for moderate pain. 04/26/22   Manuella Ghazi, Pratik D, DO  Lancets [facility] ULTRASOFT) lancets Check blood sugars twice a day 09/11/18   Chipper Herb, MD  LUMIGAN 0.01 % SOLN Place 1 drop into both eyes at bedtime. 11/09/15   [provider]  melatonin 3 MG TABS tablet Take 3 mg by mouth at bedtime.    [provider]  metFORMIN (GLUCOPHAGE-XR) 500 MG 24 hr tablet TAKE 2 TABLETS BY MOUTH EVERY DAY WITH BREAKFAST Patient not taking: Reported on 04/22/2022 10/05/21   Dettinger, Fransisca Kaufmann, MD  metoprolol succinate (TOPROL-XL) 25 MG 24 hr tablet Take 1 tablet (25 mg total) by mouth daily. 10/05/21   Dettinger, Fransisca Kaufmann, MD  Multiple Vitamins-Minerals (PRESERVISION AREDS 2 PO) Take by mouth.    [provider]  oxybutynin (DITROPAN) 5 MG tablet Take 5 mg by mouth 2 (two) times daily.    [provider]  pantoprazole (PROTONIX) 40 MG tablet Take 1 tablet (40 mg total) by mouth 2 (two) times daily. 04/26/22 05/26/22  Manuella Ghazi, Pratik D, DO  pilocarpine (PILOCAR) 1 % ophthalmic solution Place 1 drop into both eyes 2 (two) times daily.    [provider]  pravastatin (PRAVACHOL) 80 MG tablet Take 1 tablet (80 mg total) by mouth at bedtime. 10/05/21   Dettinger, Fransisca Kaufmann, MD  traZODone (DESYREL) 50 MG tablet Take 50 mg by mouth at bedtime.    [provider]  vitamin B-12 (CYANOCOBALAMIN) 500 MCG tablet Take 500 mcg by mouth daily.    [provider]   Allergies  Allergen  Reactions   Vioxx [Rofecoxib] Other (See Comments)    Ulcers in mouth  Review of Systems  Unable to perform ROS: Dementia    Physical Exam Vitals and nursing note reviewed.  Constitutional:      General: She is not in acute distress.    Appearance: She is ill-appearing.  HENT:     Mouth/Throat:     Mouth: Mucous membranes are dry.  Cardiovascular:     Rate and Rhythm: Normal rate.  Neurological:     Comments: Known dementia  Psychiatric:     Comments: Calm and cooperative, not fearful     Vital Signs: BP (!) 76/60 (BP Location: Right Arm)   Pulse 86   Temp (!) 97.5 F (36.4 C)   Resp (!) 21   Ht '5\' 5"'$  (1.651 m)   Wt 74.3 kg   SpO2 99%   BMI 27.26 kg/m  Pain Scale: 0-10   Pain Score: 0-No pain   SpO2: SpO2: 99 % O2 Device:SpO2: 99 % O2 Flow Rate: .   IO: Intake/output summary:  Intake/Output Summary (Last 24 hours) at 05/01/2022 7681 Last data filed at 05/01/2022 0500 Gross per 24 hour  Intake 3050 ml  Output 100 ml  Net 2950 ml    LBM: Last BM Date : 04/30/22 Baseline Weight: Weight: 74.3 kg Most recent weight: Weight: 74.3 kg     Palliative Assessment/Data:   Flowsheet Rows    Flowsheet Row Most Recent Value  Intake Tab   Referral Department Hospitalist  Unit at Time of Referral Med/Surg Unit  Palliative Care Primary Diagnosis Other (Comment)  Date Notified 04/30/22  Palliative Care Type New Palliative care  Reason for referral Clarify Goals of Care, End of Life Care Assistance  Date of Admission 04/30/22  Date first seen by Palliative Care 05/01/22  # of days Palliative referral response time 1 Day(s)  # of days IP prior to Palliative referral 0  Clinical Assessment   Palliative Performance Scale Score 20%  Pain Max last 24 hours Not able to report  Pain Min Last 24 hours Not able to report  Dyspnea Max Last 24 Hours Not able to report  Dyspnea Min Last 24 hours Not able to report  Psychosocial & Spiritual Assessment   Palliative Care  Outcomes        Time In: 0930 Time Out: 1045 Time Total: 75 minutes  Greater than 50%  of this time was spent counseling and coordinating care related to the above assessment and plan.  Signed by: Drue Novel, NP   Please contact Palliative Medicine Team phone at [phone] for questions and concerns.  For individual provider: See Shea Evans

## 2022-05-01 NOTE — TOC Initial Note (Signed)
Transition of Care [facility]) - Initial/Assessment Note    Patient Details  Name: GESSELLE FITZSIMONS MRN: [id number] Date of Birth: 1928-07-07  Transition of Care [facility]) CM/SW Contact:    Boneta Lucks, RN Phone Number: 05/01/2022, 10:51 AM  Clinical Narrative:   Palliative consulted TOC to send referral to hospice. Family is requesting [facility]. Referral sent and admission notified. TOC to follow.     Expected Discharge Plan: [place] Barriers to Discharge: Continued Medical Work up  Patient Goals and CMS Choice Patient states their goals for this hospitalization and ongoing recovery are:: family agreeable to hospice. CMS Medicare.gov Compare Post Acute Care list provided to:: Patient Represenative (must comment) Choice offered to / list presented to : Adult Children  Expected Discharge Plan and Services Expected Discharge Plan: [place] Acute Care Choice: Hospice Living arrangements for the past 2 months: Opp                  Prior Living Arrangements/Services Living arrangements for the past 2 months: [place] Lives with:: Facility Resident       Admission diagnosis:  Rectal bleeding [K62.5] Patient Active Problem List   Diagnosis Date Noted   Rectal bleeding    Heme positive stool    Closed fracture proximal femur, transepiphyseal, left, initial encounter ([place]) 04/22/2022   Dyslipidemia 04/22/2022   Chronic kidney disease, stage 3a ([place]) 04/22/2022   H/O TIA (transient ischemic attack) and stroke 04/22/2022   Increased intraocular pressure 04/22/2022   Dementia with behavioral disturbance ([place]) 01/12/2020   Anemia 05/20/2019   Transient alteration of awareness 04/21/2019   Advanced nonexudative age-related macular degeneration of right eye with subfoveal involvement 03/12/2018   PCO (posterior capsule opacification), bilateral 03/12/2018   PVD (posterior vitreous detachment), both eyes  03/12/2018   Pseudophakia of both eyes 03/12/2018   HOH (hard of hearing) 02/05/2018   Incisional hernia, without obstruction or gangrene 05/27/2017   Primary open angle glaucoma (POAG) of both eyes, severe stage 02/10/2017   Type 2 diabetes with nephropathy ([place]) 07/07/2015   SVT (supraventricular tachycardia) ([place]) 02/08/2015   Depression 08/24/2013   Essential hypertension 06/08/2013   Hyperlipidemia associated with type 2 diabetes mellitus ([place]) 06/08/2013   Vitamin D deficiency 06/08/2013   Diabetes mellitus type 2 controlled 06/08/2013   OA (osteoarthritis) of knee 01/18/2013   CLL (chronic lymphocytic leukemia) ([place]) 06/17/2012   PCP:  System, Provider Not In Pharmacy:   [place], [place] - [address] [place] [address] Phone: [phone] Fax: [phone]  [facility], [place] [place] [address] Phone: [phone] Fax: [phone]  [id number] [id number]: [phone]: [phone]  Readmission Risk Interventions    05/01/2022   10:48 AM  Readmission Risk Prevention Plan  Transportation Screening Complete  PCP or Specialist Appt within 3-5 Days Complete  HRI or H[place]arr[place]  Social Work Consult for [place]Counseling Complete  Palliative Care Screening Complete  Medication Review (Press photographer Complete

## 2022-05-02 DIAGNOSIS — Z515 Encounter for palliative care: Secondary | ICD-10-CM | POA: Diagnosis not present

## 2022-05-02 DIAGNOSIS — K625 Hemorrhage of anus and rectum: Secondary | ICD-10-CM | POA: Diagnosis not present

## 2022-05-02 LAB — TYPE AND SCREEN
ABO/RH(D): A POS
Antibody Screen: POSITIVE
Unit division: 0
Unit division: 0
Unit division: 0
Unit division: 0
Unit division: 0
Unit division: 0

## 2022-05-02 LAB — BPAM RBC
Blood Product Expiration Date: 202308282359
Blood Product Expiration Date: 202308282359
Blood Product Expiration Date: 202308282359
Blood Product Expiration Date: 202309022359
Blood Product Expiration Date: 202309022359
Blood Product Expiration Date: 202309022359
ISSUE DATE / TIME: 202308011851
ISSUE DATE / TIME: 202308031208
ISSUE DATE / TIME: 202308031208
Unit Type and Rh: 5100
Unit Type and Rh: 5100
Unit Type and Rh: 5100
Unit Type and Rh: 6200
Unit Type and Rh: 6200
Unit Type and Rh: 6200

## 2022-05-02 MED ORDER — LORAZEPAM 2 MG/ML IJ SOLN: 1.0000 mg | INTRAMUSCULAR | Status: AC | PRN

## 2022-05-02 MED ORDER — GLYCOPYRROLATE 0.2 MG/ML IJ SOLN: 0.2000 mg | INTRAMUSCULAR | Status: AC | PRN

## 2022-05-02 MED ORDER — HALOPERIDOL LACTATE 5 MG/ML IJ SOLN
0.5000 mg | INTRAMUSCULAR | Status: AC | PRN
Start: 2022-05-02 — End: ?

## 2022-05-02 MED ORDER — ONDANSETRON 4 MG PO TBDP: 4.0000 mg | ORAL_TABLET | Freq: Four times a day (QID) | ORAL | Status: AC | PRN

## 2022-05-02 MED ORDER — MORPHINE SULFATE (PF) 2 MG/ML IV SOLN
1.0000 mg | INTRAVENOUS | Status: AC | PRN
Start: 2022-05-02 — End: ?

## 2022-05-02 NOTE — Discharge Summary (Signed)
Physician Discharge Summary   Patient: Kristy [name] MRN: [id number] DOB: 1928-04-21  Admit date:     04/30/2022  Discharge date: 05/02/22  Discharge Physician: Barton Dubois   PCP: System, Provider Not In   Recommendations at discharge:  Comfort care and symptomatic management.  Discharge Diagnoses: Principal Problem:   Rectal bleeding Active Problems:   End of life care Closed fracture of proximal femur (left) status post surgical repair History of TIA/stroke Increased intraocular pressure Chronic kidney disease a stage IIIa Dementia with behavioral disturbances Type 2 diabetes with nephropathy/essential hypertension.  Brief hospital admission course: As per H&P written by Dr. Francine Graven on 04/30/2022 Kristy [name] is a 88 y.o. female with medical history significant for dementia with behavioral disturbances, hypertension, dyslipidemia, history of stroke/TIA, diabetes mellitus, history of macular degeneration, status post recent left total hip arthroplasty following an unwitnessed mechanical fall on 07/25 who was brought into the ER from the skilled nursing facility for evaluation of rectal bleeding. Postoperatively patient was found to have acute anemia with a hemoglobin of 6.3 and was transfused 2 units of packed RBC with appropriate response.  She was noted to be iron deficient and was scheduled for GI evaluation as an outpatient.  She has been on aspirin for DVT prophylaxis. Patient was noted to be hypotensive upon arrival to the ER and had a CT angiogram which showed active bleeding at the anorectal junction.  Rectal wall thickening with surrounding inflammation compatible with proctitis.  Surgery was consulted and 3 pieces of quick clot as well as gauze roll were inserted into the patient's anus to provide pressure and potentially stop the area of bleeding. Patient will be transfused 1 unit of packed RBC. Patient's son who is at the bedside has requested that his mother be placed on  comfort measures.  Assessment and Plan: * Rectal bleeding -Patient is a 88 year old Caucasian female with a history of dementia with behavioral disturbances, status post recent left total hip arthroplasty, history of diabetes mellitus, diverticulosis and acute work up suggesting proctitis -After discussing with patient's son decision has been made to pursued comfort measures only. -No further blood work with confusion will be pursued -Appreciate assistance and recommendation with symptomatic management by palliative care -Patient has been seen by hospice with intention to be transferred to residential hospice on 05/02/2022 (bed/staff availability).   Closed fracture of proximal femur -Status post surgical repair -Continue as needed analgesics as part of symptomatic management.   History of TIA/stroke -No new focal deficit appreciated.   Increased intraocular pressure -Continue eyedrops.   Chronic kidney disease a stage IIIa -Last checked appears to be stable -Care has been transitioned to full comfort care; continue symptomatic management only.   Dementia with behavioral disturbances -Continue as needed anxiolytic medication -Continue supportive care and comfort management.   Type 2 diabetes with nephropathy/essential hypertension -Vital signs stable -Patient's care transitioned to full comfort.    Consultants: Palliative care/hospice. Procedures performed: None Disposition: Hospice care Diet recommendation: Comfort feeding.  DISCHARGE MEDICATION: Allergies as of 05/02/2022       Reactions   Vioxx [rofecoxib] Other (See Comments)   Ulcers in mouth        Medication List     STOP taking these medications    aspirin EC 81 MG tablet   busPIRone 30 MG tablet Commonly known as: BUSPAR   cholecalciferol 1000 units tablet Commonly known as: VITAMIN D   escitalopram 20 MG tablet Commonly known as: LEXAPRO   ezetimibe 10  MG tablet Commonly known as: ZETIA    ferrous sulfate 325 (65 FE) MG tablet   glimepiride 4 MG tablet Commonly known as: AMARYL   glucose blood test strip Commonly known as: FREESTYLE TEST STRIPS   guaiFENesin 100 MG/5ML liquid Commonly known as: ROBITUSSIN   HYDROcodone-acetaminophen 5-325 MG tablet Commonly known as: NORCO/VICODIN   melatonin 3 MG Tabs tablet   metFORMIN 500 MG 24 hr tablet Commonly known as: GLUCOPHAGE-XR   metoprolol succinate 25 MG 24 hr tablet Commonly known as: TOPROL-XL   onetouch ultrasoft lancets   oxybutynin 5 MG tablet Commonly known as: DITROPAN   pantoprazole 40 MG tablet Commonly known as: Protonix   pravastatin 80 MG tablet Commonly known as: PRAVACHOL   PRESERVISION AREDS 2 PO   traZODone 50 MG tablet Commonly known as: DESYREL   vitamin B-12 500 MCG tablet Commonly known as: CYANOCOBALAMIN       TAKE these medications    glycopyrrolate 0.2 MG/ML injection Commonly known as: ROBINUL Inject 1 mL (0.2 mg total) into the vein every 4 (four) hours as needed (excessive secretions).   haloperidol lactate 5 MG/ML injection Commonly known as: HALDOL Inject 0.1 mLs (0.5 mg total) into the vein every 4 (four) hours as needed (or delirium).   LORazepam 2 MG/ML injection Commonly known as: ATIVAN Inject 0.5 mLs (1 mg total) into the vein every 4 (four) hours as needed for anxiety.   Lumigan 0.01 % Soln Generic drug: bimatoprost Place 1 drop into both eyes at bedtime.   morphine (PF) 2 MG/ML injection Inject 0.5 mLs (1 mg total) into the vein every 2 (two) hours as needed (increased RR/WOB, EOL care).   ondansetron 4 MG disintegrating tablet Commonly known as: ZOFRAN-ODT Take 1 tablet (4 mg total) by mouth every 6 (six) hours as needed for nausea.   pilocarpine 1 % ophthalmic solution Commonly known as: PILOCAR Place 1 drop into both eyes 2 (two) times daily.        Discharge Exam: Filed Weights   04/30/22 1549  Weight: 74.3 kg   General exam:  Pleasantly confused; known dementia. Respiratory system: Good saturation; no using accessory muscles. Cardiovascular system: Rate controlled; no rubs or gallops. Gastrointestinal system: Abdomen is nondistended, soft and nontender. No organomegaly or masses felt. Normal bowel sounds heard. Central nervous system: No focal neurologic deficit.  Moving 4 limbs subcutaneously. Extremities: No cyanosis or clubbing. Skin: No petechiae. Psychiatry: Judgement and insight appear impaired secondary to underlying dementia.  Condition at discharge: stable  The results of significant diagnostics from this hospitalization (including imaging, microbiology, ancillary and laboratory) are listed below for reference.   Imaging Studies: CT ANGIO GI BLEED  Result Date: 04/30/2022 CLINICAL DATA:  Acute mesenteric ischemia. Bright red blood from the rectum. EXAM: CTA ABDOMEN AND PELVIS WITHOUT AND WITH CONTRAST TECHNIQUE: Multidetector CT imaging of the abdomen and pelvis was performed using the standard protocol during bolus administration of intravenous contrast. Multiplanar reconstructed images and MIPs were obtained and reviewed to evaluate the vascular anatomy. RADIATION DOSE REDUCTION: This exam was performed according to the departmental dose-optimization program which includes automated exposure control, adjustment of the mA and/or kV according to patient size and/or use of iterative reconstruction technique. CONTRAST:  45m OMNIPAQUE IOHEXOL 350 MG/ML SOLN COMPARISON:  None Available. FINDINGS: VASCULAR Aorta: There are atherosclerotic calcifications of the aorta. Aorta is normal in size. No evidence for dissection. No significant stenosis. Celiac: Patent without evidence of aneurysm, dissection, vasculitis or significant stenosis. SMA: Patent  without evidence of aneurysm, dissection, vasculitis or significant stenosis. Renals: Both renal arteries are patent without evidence of aneurysm, dissection, vasculitis,  fibromuscular dysplasia or significant stenosis. IMA: Patent without evidence of aneurysm, dissection, vasculitis or significant stenosis. Inflow: Patent without evidence of aneurysm, dissection, vasculitis or significant stenosis. Proximal Outflow: Bilateral common femoral and visualized portions of the superficial and profunda femoral arteries are patent without evidence of aneurysm, dissection, vasculitis or significant stenosis. Veins: No obvious venous abnormality within the limitations of this arterial phase study. Review of the MIP images confirms the above findings. NON-VASCULAR Lower chest: There are small bilateral pleural effusions, right greater than left. There is bilateral lower lobe atelectasis. Hepatobiliary: No focal liver abnormality is seen. Status post cholecystectomy. No biliary dilatation. Pancreas: Unremarkable. No pancreatic ductal dilatation or surrounding inflammatory changes. Spleen: Normal in size without focal abnormality. Adrenals/Urinary Tract: There is a 12 mm simple cyst in the right kidney. Otherwise, the kidneys, adrenal glands and bladder are within normal limits. Foley catheter decompresses the bladder. Stomach/Bowel: There is an area of active hemorrhage at the level of the anorectal junction best seen on image 9/229. There is diffuse rectal wall thickening with mild surrounding inflammatory stranding. There is no bowel obstruction or free air. There is no pneumatosis. Appendix is not seen. There is sigmoid colon diverticulosis without evidence for diverticulitis. Small hiatal hernia is present. The stomach is otherwise within normal limits. Lymphatic: No enlarged lymph nodes are identified. Reproductive: Status post hysterectomy. No adnexal masses. Other: Anterior abdominal wall mesh is present. There is a very small fat containing umbilical hernia. There is no ascites. Presacral edema. There is diffuse body wall edema. Musculoskeletal: Comminuted left intratrochanteric  fracture seen with left hip screw and intramedullary nail. There is mild surrounding edema compatible with recent surgery. Lateral skin staples are present. L4-L5 posterior fusion hardware appears chronic and within normal limits. Multilevel degenerative changes affect the spine. IMPRESSION: VASCULAR 1. Active bleeding at the anorectal junction. 2. No evidence for aortic dissection or aneurysm. 3.  Aortic Atherosclerosis (ICD10-I70.0). NON-VASCULAR 1. Rectal wall thickening with surrounding inflammation compatible with proctitis. There is presacral edema. 2. Small bilateral pleural effusions. 3. Body wall edema. 4. Small hiatal hernia. 5. Colonic diverticulosis. 6. Recent ORIF of left femoral intratrochanteric fracture. Electronically Signed   By: Ronney Asters M.D.   On: 04/30/2022 17:13   DG FEMUR MIN 2 VIEWS LEFT  Result Date: 04/23/2022 CLINICAL DATA:  Fracture left femur EXAM: LEFT FEMUR 2 VIEWS COMPARISON:  04/22/2022 FINDINGS: There is interval reduction and internal fixation of displaced fracture of proximal shaft of left femur with intramedullary rod. There is a large caliber screw in the neck of the femur. There are pockets of air in the soft tissues. Skin staples are noted. There is previous left knee arthroplasty. Arterial calcifications are seen in soft tissues. IMPRESSION: Status post reduction and internal fixation of fracture of proximal left femur. Electronically Signed   By: Elmer Picker M.D.   On: 04/23/2022 16:21   DG HIP UNILAT WITH PELVIS 2-3 VIEWS LEFT  Result Date: 04/23/2022 CLINICAL DATA:  Left femur ORIF EXAM: DG HIP (WITH OR WITHOUT PELVIS) 2-3V LEFT COMPARISON:  04/22/2022 FINDINGS: Ten C-arm fluoroscopic images were obtained intraoperatively and submitted for post operative interpretation. Images obtained during IM rod and screw fixation traversing proximal left femur fracture. 4 minutes 20 seconds fluoroscopy time utilized. Radiation dose: 146.19 mGy. Please see the  performing provider's procedural report for further detail. IMPRESSION: Intraoperative ORIF proximal  left femur fracture. Electronically Signed   By: Davina Poke D.O.   On: 04/23/2022 15:26   DG C-Arm 1-60 Min-No Report  Result Date: 04/23/2022 Fluoroscopy was utilized by the requesting physician.  No radiographic interpretation.   DG C-Arm 1-60 Min-No Report  Result Date: 04/23/2022 Fluoroscopy was utilized by the requesting physician.  No radiographic interpretation.   CT CERVICAL SPINE WO CONTRAST  Result Date: 04/22/2022 CLINICAL DATA:  88 year old female status post fall EXAM: CT CERVICAL SPINE WITHOUT CONTRAST TECHNIQUE: Multidetector CT imaging of the cervical spine was performed without intravenous contrast. Multiplanar CT image reconstructions were also generated. RADIATION DOSE REDUCTION: This exam was performed according to the departmental dose-optimization program which includes automated exposure control, adjustment of the mA and/or kV according to patient size and/or use of iterative reconstruction technique. COMPARISON:  CT 03/19/2022 FINDINGS: Motion artifact obscures bony detail. Alignment: Rightward rotation of C1 around the dens has increased since 03/19/2022. This is favored positional although given the degree of rotation, rotary subluxation can not be excluded. Additional alignment is unchanged. Slight anterolisthesis C2 and C3. Skull base and vertebrae: No acute fracture. No primary bone lesion or focal pathologic process. Soft tissues and spinal canal: No prevertebral fluid or swelling. No visible canal hematoma. Disc levels: Multilevel disc space height loss greatest at C5-C6 and C6-C7 where it is advanced. Multilevel spondylosis with anterior and posterior osteophytes greatest at C5-C7. Advanced cervical facet arthropathy. The posterior disc osteophyte complexes at C3-C4, C4-C5, C5-C6, and C6-C7 cause encroachment upon the spinal canal. Multilevel neural foraminal  narrowing secondary to uncovertebral hypertrophy and facet arthropathy. Greatest at C5-C7 where it is advanced. Upper chest: Negative. Other: None. IMPRESSION: 1. Increased rotation of C1 about the dens compared with 03/19/2022. This is favored positional though rotary subluxation can not be excluded. This finding was called by telephone at the time of interpretation on 04/22/2022 at 11:26 am to provider [facility] , who verbally acknowledged these results. 2. Chronic cervical spondylosis is not significantly changed from 03/19/2022. Electronically Signed   By: Placido Sou M.D.   On: 04/22/2022 11:27   CT HEAD WO CONTRAST  Result Date: 04/22/2022 CLINICAL DATA:  Polytrauma, blunt EXAM: CT HEAD WITHOUT CONTRAST TECHNIQUE: Contiguous axial images were obtained from the base of the skull through the vertex without intravenous contrast. RADIATION DOSE REDUCTION: This exam was performed according to the departmental dose-optimization program which includes automated exposure control, adjustment of the mA and/or kV according to patient size and/or use of iterative reconstruction technique. COMPARISON:  CT head June twenty, 2023. FINDINGS: Brain: Motion limited study. Similar prior left PCA territory infarct with encephalomalacia in the left occipital lobe. Similar chronic microvascular disease and atrophy. No definite acute large vascular territory infarct. No evidence of acute hemorrhage, mass lesion, midline shift, or hydrocephalus. Vascular: No obvious hyperdense vessel identified. Calcific intracranial atherosclerosis. Skull: No acute fracture. Sinuses/Orbits: Clear sinuses.  No acute orbital findings. Other: No mastoid effusions. IMPRESSION: 1. Motion limited study without evidence of acute intracranial abnormality. 2. Similar prior left PCA territory infarct. An MRI could provide more sensitive evaluation for acute infarct if clinically warranted. Electronically Signed   By: Margaretha Sheffield M.D.   On:  04/22/2022 10:49   DG FEMUR MIN 2 VIEWS LEFT  Result Date: 04/22/2022 CLINICAL DATA:  Fall with left hip pain and deformity EXAM: LEFT FEMUR 2 VIEWS; PELVIS - 1-2 VIEW COMPARISON:  Pelvis radiographs 02/07/2015 FINDINGS: Pelvis: There is an acute displaced fracture of the proximal left  femur with significant medial and posterior angulation of the distal fragment. Femoroacetabular alignment is maintained. Right hip alignment is maintained. There is no right hip fracture. The SI joints and symphysis pubis are intact. Lower lumbar spine fusion hardware is noted. Femur: No additional femur fracture is seen. Postsurgical changes reflecting right knee arthroplasty are seen without evidence of complication. IMPRESSION: Acute displaced and posteromedially angulated proximal left femur fracture. Electronically Signed   By: Valetta Mole M.D.   On: 04/22/2022 10:18   DG Pelvis 1-2 Views  Result Date: 04/22/2022 CLINICAL DATA:  Fall with left hip pain and deformity EXAM: LEFT FEMUR 2 VIEWS; PELVIS - 1-2 VIEW COMPARISON:  Pelvis radiographs 02/07/2015 FINDINGS: Pelvis: There is an acute displaced fracture of the proximal left femur with significant medial and posterior angulation of the distal fragment. Femoroacetabular alignment is maintained. Right hip alignment is maintained. There is no right hip fracture. The SI joints and symphysis pubis are intact. Lower lumbar spine fusion hardware is noted. Femur: No additional femur fracture is seen. Postsurgical changes reflecting right knee arthroplasty are seen without evidence of complication. IMPRESSION: Acute displaced and posteromedially angulated proximal left femur fracture. Electronically Signed   By: Valetta Mole M.D.   On: 04/22/2022 10:18   DG Chest 1 View  Result Date: 04/22/2022 CLINICAL DATA:  Fall with left hip pain and deformity. EXAM: CHEST  1 VIEW COMPARISON:  Chest radiograph 03/03/2019 FINDINGS: Cardiomediastinal silhouette is within normal limits.  There is no focal consolidation or pulmonary edema. There is no pleural effusion or pneumothorax There is no acute osseous abnormality. No displaced rib fracture is seen. IMPRESSION: No radiographic evidence of acute cardiopulmonary process. Electronically Signed   By: Valetta Mole M.D.   On: 04/22/2022 10:14    Microbiology: Results for orders placed or performed during the hospital encounter of 04/30/22  Blood culture (routine x 2)     Status: None (Preliminary result)   Collection Time: 04/30/22  4:53 PM   Specimen: Left Antecubital; Blood  Result Value Ref Range Status   Specimen Description LEFT ANTECUBITAL  Final   Special Requests   Final    BOTTLES DRAWN AEROBIC AND ANAEROBIC Blood Culture adequate volume   Culture   Final    NO GROWTH < 12 HOURS Performed at [facility], [address] [place], [place] [address]    Report Status PENDING  Incomplete    Labs: CBC: Recent Labs  Lab 04/26/22 0441 04/30/22 1545 04/30/22 1613  WBC 22.5* 28.2*  --   NEUTROABS  --  20.0*  --   HGB 9.6* 8.0* 7.1*  HCT 29.4* 24.9* 21.0*  MCV 94.2 96.1  --   PLT 146* 207  --    Basic Metabolic Panel: Recent Labs  Lab 04/26/22 0441 04/30/22 1545 04/30/22 1613  NA 139 131* 132*  K 4.8 4.8 4.8  CL 110 104 101  CO2 23 21*  --   GLUCOSE 155* 258* 256*  BUN 40* 33* 28*  CREATININE 1.16* 1.18* 1.20*  CALCIUM 8.6* 7.7*  --   MG 2.0  --   --    Liver Function Tests: Recent Labs  Lab 04/30/22 1545  AST 30  ALT 23  ALKPHOS 59  BILITOT 1.2  PROT 4.7*  ALBUMIN 2.0*   CBG: Recent Labs  Lab 04/26/22 0411 04/26/22 0715 04/26/22 1147 04/26/22 1629 04/26/22 2030  GLUCAP 153* 150* 149* 174* 144*    Discharge time spent: greater than 30 minutes.  Signed: Clifton James  Dyann Kief, MD [facility] 05/02/2022

## 2022-05-02 NOTE — TOC Transition Note (Signed)
Transition of Care [facility]) - CM/SW Discharge Note   Patient Details  Name: Kristy [name] MRN: [id number] Date of Birth: 25-Oct-1927  [facility] CM/SW Contact:  Boneta Lucks, RN Phone Number: 05/02/2022, 10:34 AM   Clinical Narrative:   Hospice arranged transportation for patient to go to [facility] at 10am.    Final next level of care: [place] Barriers to Discharge: Barriers Resolved   Patient Goals and CMS Choice Patient states their goals for this hospitalization and ongoing recovery are:: family agreeable to hospice. CMS Medicare.gov Compare Post Acute Care list provided to:: Patient Represenative (must comment) Choice offered to / list presented to : Adult Children  Discharge Placement             Patient and family notified of of transfer: 05/02/22  Discharge Plan and Services     Post Acute Care Choice: Hospice              Readmission Risk Interventions    05/01/2022   10:48 AM  Readmission Risk Prevention Plan  Transportation Screening Complete  PCP or Specialist Appt within 3-5 Days Complete  HRI or [place] Complete  Social Work Consult for [place] Planning/Counseling Complete  Palliative Care Screening Complete  Medication Review [employer]) Complete

## 2022-05-05 LAB — CULTURE, BLOOD (ROUTINE X 2)
Culture: NO GROWTH
Special Requests: ADEQUATE

## 2022-05-06 ENCOUNTER — Ambulatory Visit: Payer: Medicare Other | Admitting: Physician Assistant

## 2022-05-14 ENCOUNTER — Encounter: Payer: Medicare Other | Admitting: Orthopedic Surgery

## 2022-05-30 ENCOUNTER — Ambulatory Visit (INDEPENDENT_AMBULATORY_CARE_PROVIDER_SITE_OTHER): Payer: Medicare Other | Admitting: Gastroenterology

## 2022-05-31 DEATH — deceased
